# Patient Record
Sex: Female | Born: 1970
Health system: Southern US, Community
[De-identification: ages and names within clinical notes are randomized; demographics above are authoritative.]

## PROBLEM LIST (undated history)

## (undated) DIAGNOSIS — I10 Essential (primary) hypertension: Secondary | ICD-10-CM

## (undated) DIAGNOSIS — L709 Acne, unspecified: Secondary | ICD-10-CM

## (undated) DIAGNOSIS — J302 Other seasonal allergic rhinitis: Secondary | ICD-10-CM

## (undated) DIAGNOSIS — M549 Dorsalgia, unspecified: Secondary | ICD-10-CM

---

## 1997-08-08 ENCOUNTER — Emergency Department (HOSPITAL_COMMUNITY): Admission: EM | Admit: 1997-08-08 | Discharge: 1997-08-08 | Payer: Self-pay | Admitting: Emergency Medicine

## 1997-08-11 ENCOUNTER — Emergency Department (HOSPITAL_COMMUNITY): Admission: EM | Admit: 1997-08-11 | Discharge: 1997-08-11 | Payer: Self-pay | Admitting: Emergency Medicine

## 2000-05-22 ENCOUNTER — Emergency Department (HOSPITAL_COMMUNITY): Admission: EM | Admit: 2000-05-22 | Discharge: 2000-05-22 | Payer: Self-pay | Admitting: Emergency Medicine

## 2000-05-31 ENCOUNTER — Emergency Department (HOSPITAL_COMMUNITY): Admission: EM | Admit: 2000-05-31 | Discharge: 2000-05-31 | Payer: Self-pay | Admitting: Emergency Medicine

## 2001-05-23 ENCOUNTER — Encounter: Admission: RE | Admit: 2001-05-23 | Discharge: 2001-05-23 | Payer: Self-pay | Admitting: Psychiatry

## 2001-05-26 ENCOUNTER — Encounter: Admission: RE | Admit: 2001-05-26 | Discharge: 2001-05-26 | Payer: Self-pay | Admitting: *Deleted

## 2001-06-06 ENCOUNTER — Encounter: Admission: RE | Admit: 2001-06-06 | Discharge: 2001-06-06 | Payer: Self-pay | Admitting: *Deleted

## 2002-04-09 ENCOUNTER — Emergency Department (HOSPITAL_COMMUNITY): Admission: EM | Admit: 2002-04-09 | Discharge: 2002-04-09 | Payer: Self-pay

## 2002-11-05 ENCOUNTER — Emergency Department (HOSPITAL_COMMUNITY): Admission: EM | Admit: 2002-11-05 | Discharge: 2002-11-05 | Payer: Self-pay

## 2004-11-16 ENCOUNTER — Ambulatory Visit: Payer: Self-pay | Admitting: Internal Medicine

## 2004-12-07 ENCOUNTER — Ambulatory Visit: Payer: Self-pay | Admitting: Family Medicine

## 2005-02-03 ENCOUNTER — Ambulatory Visit: Payer: Self-pay | Admitting: Family Medicine

## 2005-02-17 ENCOUNTER — Encounter (INDEPENDENT_AMBULATORY_CARE_PROVIDER_SITE_OTHER): Payer: Self-pay | Admitting: *Deleted

## 2005-02-17 ENCOUNTER — Ambulatory Visit: Payer: Self-pay | Admitting: Family Medicine

## 2006-09-09 ENCOUNTER — Emergency Department (HOSPITAL_COMMUNITY): Admission: EM | Admit: 2006-09-09 | Discharge: 2006-09-09 | Payer: Self-pay | Admitting: Emergency Medicine

## 2006-09-13 DIAGNOSIS — J309 Allergic rhinitis, unspecified: Secondary | ICD-10-CM | POA: Insufficient documentation

## 2011-11-28 ENCOUNTER — Encounter (HOSPITAL_COMMUNITY): Payer: Self-pay | Admitting: Adult Health

## 2011-11-28 ENCOUNTER — Emergency Department (HOSPITAL_COMMUNITY)
Admission: EM | Admit: 2011-11-28 | Discharge: 2011-11-28 | Disposition: A | Discharge status: Home / Self Care | Payer: Managed Care, Other (non HMO) | Attending: Emergency Medicine | Admitting: Emergency Medicine

## 2011-11-28 DIAGNOSIS — IMO0002 Reserved for concepts with insufficient information to code with codable children: Secondary | ICD-10-CM

## 2011-11-28 DIAGNOSIS — W268XXA Contact with other sharp object(s), not elsewhere classified, initial encounter: Secondary | ICD-10-CM | POA: Insufficient documentation

## 2011-11-28 DIAGNOSIS — M549 Dorsalgia, unspecified: Secondary | ICD-10-CM | POA: Insufficient documentation

## 2011-11-28 DIAGNOSIS — I10 Essential (primary) hypertension: Secondary | ICD-10-CM | POA: Insufficient documentation

## 2011-11-28 DIAGNOSIS — Y929 Unspecified place or not applicable: Secondary | ICD-10-CM | POA: Insufficient documentation

## 2011-11-28 DIAGNOSIS — S61209A Unspecified open wound of unspecified finger without damage to nail, initial encounter: Secondary | ICD-10-CM | POA: Insufficient documentation

## 2011-11-28 DIAGNOSIS — Y9389 Activity, other specified: Secondary | ICD-10-CM | POA: Insufficient documentation

## 2011-11-28 HISTORY — DX: Dorsalgia, unspecified: M54.9

## 2011-11-28 HISTORY — DX: Essential (primary) hypertension: I10

## 2011-11-28 HISTORY — DX: Other seasonal allergic rhinitis: J30.2

## 2011-11-28 HISTORY — DX: Acne, unspecified: L70.9

## 2011-11-28 MED ORDER — TETANUS-DIPHTHERIA TOXOIDS TD 5-2 LFU IM INJ
0.5000 mL | INJECTION | Freq: Once | INTRAMUSCULAR | Status: AC
  Administered 2011-11-28: 0.5 mL via INTRAMUSCULAR
  Filled 2011-11-28: qty 0.5

## 2011-11-28 NOTE — ED Notes (Signed)
Pt states understanding of discharge instructions 

## 2011-11-28 NOTE — ED Provider Notes (Signed)
History     CSN: [id number]  Arrival date & time 11/28/11  0104   First MD Initiated Contact with Patient 11/28/11 0155      Chief Complaint  Patient presents with  . Laceration    (Consider location/radiation/quality/duration/timing/severity/associated sxs/prior treatment) HPI Comments: Patient was washing dishes with a piece of steel while when she sustained a superficial laceration to her right palm approximately 1 cm long.  No active bleeding.  Her last tetanus status is unknown  Patient is a 41 y.o. female presenting with skin laceration. The history is provided by the patient.  Laceration  The incident occurred 1 to 2 hours ago. The laceration is 1 cm in size. The laceration mechanism is unknown.The pain is at a severity of 1/10. The patient is experiencing no pain. Her tetanus status is out of date.    Past Medical History  Diagnosis Date  . Hypertension   . Back pain   . Acne   . Seasonal allergies     History reviewed. No pertinent past surgical history.  History reviewed. No pertinent family history.  History  Substance Use Topics  . Smoking status: Never Smoker   . Smokeless tobacco: Not on file  . Alcohol Use: No    OB History    Grav Para Term Preterm Abortions TAB SAB Ect Mult Living                  Review of Systems  Constitutional: Negative for activity change.  Musculoskeletal: Negative for joint swelling.  Skin: Positive for wound.  Neurological: Negative for numbness.    Allergies  Review of patient's allergies indicates no known allergies.  Home Medications   Current Outpatient Rx  Name  Route  Sig  Dispense  Refill  . DICLOFENAC SODIUM 75 MG PO TBEC   Oral   Take 75 mg by mouth 2 (two) times daily as needed. For pain         . DOXYCYCLINE HYCLATE 100 MG PO TABS   Oral   Take 100 mg by mouth 2 (two) times daily.         Marland Kitchen HALOPERIDOL 2 MG PO TABS   Oral   Take 2 mg by mouth at bedtime.         Marland Kitchen  LISINOPRIL-HYDROCHLOROTHIAZIDE 20-25 MG PO TABS   Oral   Take 1 tablet by mouth daily.           BP 156/83  Pulse 88  Temp 97.4 F (36.3 C) (Oral)  Resp 16  SpO2 100%  Physical Exam  Constitutional: She is oriented to person, place, and time. She appears well-developed and well-nourished.  HENT:  Head: Normocephalic.  Eyes: Pupils are equal, round, and reactive to light.  Neck: Normal range of motion.  Cardiovascular: Normal rate.   Pulmonary/Chest: Effort normal.  Musculoskeletal: Normal range of motion. She exhibits no edema and no tenderness.  Neurological: She is alert and oriented to person, place, and time.  Skin: Skin is warm.       Superficial lacerations to the callus of the right thumb of the, the, DIP joint on the palmar surface    ED Course  Procedures (including critical care time)  Labs Reviewed - No data to display No results found.   1. Laceration       MDM   Superficial laceration to the palmar surface of the right thumb.  Will closed with Dermabond as it doesn't involve the joint and update her  tetanus        Arman Filter, NP 11/28/11 [phone]

## 2011-11-28 NOTE — ED Notes (Signed)
Cut right hand thumb with a steel wool pad while washing dishes, 0.5 inch cut, bleeding controlled, CMS intact

## 2011-11-28 NOTE — ED Provider Notes (Signed)
Medical screening examination/treatment/procedure(s) were performed by non-physician practitioner and as supervising physician I was immediately available for consultation/collaboration.  Sunnie Nielsen, MD 11/28/11 [phone]

## 2012-01-17 ENCOUNTER — Ambulatory Visit: Payer: Self-pay | Admitting: Family Medicine

## 2012-01-17 VITALS — BP 131/83 | HR 93 | Temp 97.5°F | Resp 16 | Ht 61.5 in | Wt 122.6 lb

## 2012-01-17 DIAGNOSIS — N912 Amenorrhea, unspecified: Secondary | ICD-10-CM

## 2012-01-17 DIAGNOSIS — Z3009 Encounter for other general counseling and advice on contraception: Secondary | ICD-10-CM

## 2012-01-17 LAB — POCT URINE PREGNANCY: Preg Test, Ur: NEGATIVE

## 2012-01-17 NOTE — Patient Instructions (Addendum)
NO CONDOMS, NO SEX!  Advise going to health department sometime and getting tested for STD's (gonorrhea, chlamydia, HIV, syphillis).         Contraception Choices Birth control (contraception) can stop pregnancy from happening. Different types of birth control work in different ways. Some can:  Make the mucus in the cervix thick. This makes it hard for sperm to get into the uterus.  Thin the lining of the uterus. This makes it hard for an egg to attach to the wall of the uterus.  Stop the ovaries from releasing an egg.  Block the sperm from reaching the egg. Certain types of surgery can stop pregnancy from happening. For women, the sugery closes the fallopian tubes (tubal ligation). For men, the surgery stops sperm from releasing during sex (vasectomy). HORMONAL BIRTH CONTROL Hormonal birth control stops pregnancy by putting hormones into your body. Types of birth control include:  A small tube put under the skin of the upper arm (implant). The tube can stay in place for 3 years.  Shots given every 3 months.  Pills taken every day or once after sex (intercourse).  Patches that are changed once a week.  A ring put into the vagina (vaginal ring). The ring is left in place for 3 weeks and removed for 1 week. Then, a new ring is put in the vagina. BARRIER BIRTH CONTROL  Barrier birth control blocks sperm from reaching the egg. Types of birth control include:   A thin covering worn on the penis (female condom) during sex.  A soft, loose covering put into the vagina (female condom) before sex.  A rubber bowl that sits over the cervix (diaphragm). The bowl must be made for you. The bowl is put into the vagina before sex. The bowl is left in place for 6 to 8 hours after sex.  A small, soft cup that fits over the cervix (cervical cap). The cup must be made for you. The cup can be left in place for 48 hours after sex.  A sponge that is put into the vagina before sex.  A chemical that  kills or blocks sperm from getting into the cervix and uterus (spermicide). The chemical may be a cream, jelly, foam, or pill. INTRAUTERINE (IUD) BIRTH CONTROL  IUD birth control is a small, T-shaped piece of plastic. The plastic is put inside the uterus. There are 2 types of IUD:  Copper IUD. The IUD is covered in copper wire. The copper makes a fluid that kills sperm. It can stay in place for 10 years.  Hormone IUD. The hormone stops pregnancy from happening. It can stay in place for 5 years. NATURAL FAMILY PLANNING BIRTH CONTROL  Natural family planning means not having sex or using barrier birth control when the woman is fertile. A woman can:  Use a calendar to keep track of when she is fertile.  Use a thermometer to measure her body temperature. Protect yourself against sexual diseases no matter what type of birth control you use. Talk to your doctor about which type of birth control is best for you. Document Released: 11/01/2008 Document Revised: 03/29/2011 Document Reviewed: 05/13/2010 [facility] Patient Information 2013 [place], [place].

## 2012-01-17 NOTE — Progress Notes (Signed)
Subjective: Patient is here for amenorrhea. Her boyfriend who is from [place] is here with her. They do not live together. They do have sex, and he refuses to use a condom apparently. Initially she reported that her last period was in November. However, after he exited the room she told me that her last period was December 23. She is from [place] and a little difficult to understand. A conversation was a little confusing. He apparently insists on not using condoms but does not want her to get pregnant will be pregnant. She does not want to use anything else at this time. She is going to get him to use condoms.  Objective: Abdomen soft Urine hCG is negative  Assessment: Normal menstruation/false history of amenorrhea  Plan: See instructions. I spoke to him about the mandatory nature of him using condoms.

## 2013-02-23 ENCOUNTER — Encounter (HOSPITAL_COMMUNITY): Payer: Self-pay | Admitting: Emergency Medicine

## 2013-02-23 ENCOUNTER — Emergency Department (INDEPENDENT_AMBULATORY_CARE_PROVIDER_SITE_OTHER)
Admission: EM | Admit: 2013-02-23 | Discharge: 2013-02-23 | Disposition: A | Discharge status: Home / Self Care | Payer: BC Managed Care – PPO | Source: Home / Self Care | Attending: Family Medicine | Admitting: Family Medicine

## 2013-02-23 DIAGNOSIS — L84 Corns and callosities: Secondary | ICD-10-CM

## 2013-02-23 NOTE — Discharge Instructions (Signed)
Please read instructions below. You can get over the counter pads (Dr Felicie [employer]) to use to dissolve the corns on your toes. Soaking your feet in warm water or showering before you apply the pads may help. If they do not improve you can arrange follow up at the [place] for further evaluation.   [place] version of above instructions:   [employer]. Vous pouvez [employer] (Dr [employer] marques)  utiliser pour [employer] vos orteils. Se tremper les pieds dans l'eau chaude ou une douche avant que vous appliquez les lectrodes peuvent vous aider. S'ils ne s'amliorent pas vous pouvez organiser le suivi jusqu' l'intrieur de la triade pied centre pour [employer] plus approfondie.  Corns and Calluses Corns are small areas of thickened skin that usually occur on the top, sides, or tip of a toe. They contain a cone-shaped core with a point that can press on a nerve below. This causes pain. Calluses are areas of thickened skin that usually develop on hands, fingers, palms, soles of the feet, and heels. These are areas that experience frequent friction or pressure. CAUSES  Corns are usually the result of rubbing (friction) or pressure from shoes that are too tight or do not fit properly. Calluses are caused by repeated friction and pressure on the affected areas. SYMPTOMS  A hard growth on the skin.  Pain or tenderness under the skin.  Sometimes, redness and swelling.  Increased discomfort while wearing tight-fitting shoes. DIAGNOSIS  Your caregiver can usually tell what the problem is by doing a physical exam. TREATMENT  Removing the cause of the friction or pressure is usually the only treatment needed. However, sometimes medicines can be used to help soften the hardened, thickened areas. These medicines include salicylic acid plasters and 12% ammonium lactate lotion. These medicines should only be used under the direction  of your caregiver. HOME CARE INSTRUCTIONS   Try to remove pressure from the affected area.  You may wear donut-shaped corn pads to protect your skin.  You may use a pumice stone or nonmetallic nail file to gently reduce the thickness of a corn.  Wear properly fitted footwear.  If you have calluses on the hands, wear gloves during activities that cause friction.  If you have diabetes, you should regularly examine your feet. Tell your caregiver if you notice any problems with your feet. SEEK IMMEDIATE MEDICAL CARE IF:   You have increased pain, swelling, redness, or warmth in the affected area.  Your corn or callus starts to drain fluid or bleeds.  You are not getting better, even with treatment. Document Released: 10/11/2003 Document Revised: 03/29/2011 Document Reviewed: 09/01/2010 [facility] Patient Information 2014 [place], [place].  [place] Version of above instructions:  Les cors et [employer] de petites zones de peau paissie qui se produisent gnralement sur le haut, les cts ou la pointe d'un orteil. Ils contiennent une forme de cne core sur un point [employer] un nerf ci-dessous. Ceci provoque la douleur. Callosits sont des zones de peau paissie que dveloppent habituellement sur les mains, les doigts, les paumes et la plante des pieds, et talons. Ces domaines sont des [employer] frquemment de friction ou de pression. CAUSES Corns sont habituellement le rsultat de frottement (frottement) ou la pression de chaussures qui sont trop serrs ou ne pas Dietitian. Callosits sont causes par friction rptes et de [employer] zones touches. SYMPTMES une croissance dure  sur la peau. La douleur ou la tendresse sous la peau. Parfois, rougeur et oedme. Une gne accrue tout en portant tanche chaussures. DIAGNOSTIC Votre soignant peut gnralement dire quel est le problme en faisant un examen physique. TRAITEMENT supprimant la  cause de la friction ou la pression est gnralement le seul traitement ncessaires. Parfois, cependant, mdicaments peuvent tre utiliss pour [employer] endurcis, paissie domaines. Ces mdicaments comprennent acide salicylique sparadraps et 12% lactate d'ammonium lotion. Ces mdicaments ne devraient tre utiliss que sous la direction de votre soignant. SOINS  DOMICILE LES INSTRUCTIONS essayer d'enlever la pression de la zone touche. Vous pouvez porter en forme de beigne mas pads pour protger votre peau. Vous pouvez utiliser une pierre ponce ou non mtalliques lime  ongles doucement rduire Market researcher mas. Usure correctement quip de chaussures. Si vous avez [employer], porter des gants pendant les activits que provoquer des frictions. Si vous avez le diabte, vous devriez Psychiatrist vos pieds. Avertissez votre mdecin si vous remarquez des [employer] vos pieds. Consultez immdiatement votre mdecin si : vous Product/process development scientist, l'enflure, la rougeur, ou la Hum[employer] Inc zone [place]. Votre cueilleur  mas ou callosit commence  vidanger le liquide ou saigne. Vous n'tes pas mieux, mme avec un traitement.

## 2013-02-23 NOTE — ED Notes (Signed)
C/o problem w left foot x > 1 month, getting worse, also has cold type symptoms x past couple of days; NAD

## 2013-02-24 NOTE — ED Provider Notes (Signed)
CSN: [id number]     Arrival date & time 02/23/13  1051 History   First MD Initiated Contact with Patient 02/23/13 1244     Chief Complaint  Patient presents with  . Foot Problem    Patient is a 43 y.o. female presenting with lower extremity pain. The history is provided by the patient.  Foot Pain This is a new problem. The current episode started more than 1 week ago. The problem occurs constantly. The problem has been gradually worsening. The symptoms are aggravated by walking. She has tried nothing for the symptoms.    Past Medical History  Diagnosis Date  . Hypertension   . Back pain   . Acne   . Seasonal allergies    History reviewed. No pertinent past surgical history. History reviewed. No pertinent family history. History  Substance Use Topics  . Smoking status: Never Smoker   . Smokeless tobacco: Not on file  . Alcohol Use: No   OB History   Grav Para Term Preterm Abortions TAB SAB Ect Mult Living                 Review of Systems  All other systems reviewed and are negative.    Allergies  Review of patient's allergies indicates no known allergies.  Home Medications   Current Outpatient Rx  Name  Route  Sig  Dispense  Refill  . diclofenac (VOLTAREN) 75 MG EC tablet   Oral   Take 75 mg by mouth 2 (two) times daily as needed. For pain         . doxycycline (VIBRA-TABS) 100 MG tablet   Oral   Take 100 mg by mouth 2 (two) times daily.         . haloperidol (HALDOL) 2 MG tablet   Oral   Take 2 mg by mouth at bedtime.         Marland Kitchen ibuprofen (ADVIL,MOTRIN) 100 MG tablet   Oral   Take 100 mg by mouth every 6 (six) hours as needed.         Marland Kitchen lisinopril-hydrochlorothiazide (PRINZIDE,ZESTORETIC) 20-25 MG per tablet   Oral   Take 1 tablet by mouth daily.         . Multiple Vitamin (MULTIVITAMIN) tablet   Oral   Take 1 tablet by mouth daily.          BP 160/92  Pulse 87  Temp(Src) 97.5 F (36.4 C) (Oral)  Resp 16  SpO2 100% Physical Exam    Vitals reviewed. Constitutional: She is oriented to person, place, and time. She appears well-developed and well-nourished.  Non-toxic appearance. She does not have a sickly appearance. She does not appear ill.  HENT:  Head: Normocephalic and atraumatic.  Eyes: Conjunctivae are normal.  Cardiovascular: Normal rate.   Pulmonary/Chest: Effort normal.  Abdominal: There is no guarding.  Musculoskeletal: Normal range of motion.       Left foot: She exhibits normal range of motion and no swelling.       Feet:  Hyperkeratotic lesions noted on lateral aspects of 3rd and 4th (L) toes. No swelling, erythema or other  s/s infectious process to surrounding tissues.   Neurological: She is alert and oriented to person, place, and time.  Skin: Skin is warm and dry.  Psychiatric: She has a normal mood and affect.    ED Course  Procedures (including critical care time) Labs Review Labs Reviewed - No data to display Imaging Review No results found.  MDM   1. Corn of toe (Hyperkeratosis of (L) 3rd and 4th toes)    1 month h/o worsening pain to (L) foot related to hyperkeratotic lesions between 3rd and 4th (L) toes. No s/s infectious process. Home care and OTC remedies discussed (and provided in writing). Dermatology and Podiatry referrals provided  If no improvement after these measures. Pt verbalizes understanding and is agreeable.Rhetta Mura Schorr, NP 02/24/13 1415

## 2013-02-28 NOTE — ED Provider Notes (Signed)
Medical screening examination/treatment/procedure(s) were performed by resident physician or non-physician practitioner and as supervising physician I was immediately available for consultation/collaboration.   Pauline Good MD.   Billy Fischer, MD 02/28/13 [phone]

## 2013-08-31 ENCOUNTER — Emergency Department (INDEPENDENT_AMBULATORY_CARE_PROVIDER_SITE_OTHER)
Admission: EM | Admit: 2013-08-31 | Discharge: 2013-08-31 | Disposition: A | Discharge status: Home / Self Care | Payer: BC Managed Care – PPO | Source: Home / Self Care | Attending: Emergency Medicine | Admitting: Emergency Medicine

## 2013-08-31 ENCOUNTER — Encounter (HOSPITAL_COMMUNITY): Payer: Self-pay | Admitting: Emergency Medicine

## 2013-08-31 DIAGNOSIS — N926 Irregular menstruation, unspecified: Secondary | ICD-10-CM

## 2013-08-31 DIAGNOSIS — N939 Abnormal uterine and vaginal bleeding, unspecified: Secondary | ICD-10-CM

## 2013-08-31 LAB — POCT URINALYSIS DIP (DEVICE)
Glucose, UA: 100 mg/dL — AB
Ketones, ur: 15 mg/dL — AB
NITRITE: NEGATIVE
PH: 5.5 (ref 5.0–8.0)
Protein, ur: >=300 mg/dL — AB
Specific Gravity, Urine: 1.01 (ref 1.005–1.030)
UROBILINOGEN UA: 1 mg/dL (ref 0.0–1.0)

## 2013-08-31 LAB — POCT I-STAT, CHEM 8
BUN: 13 mg/dL (ref 6–23)
Calcium, Ion: 1.18 mmol/L (ref 1.12–1.23)
Chloride: 99 mEq/L (ref 96–112)
Creatinine, Ser: 0.6 mg/dL (ref 0.50–1.10)
GLUCOSE: 77 mg/dL (ref 70–99)
HCT: 35 % — ABNORMAL LOW (ref 36.0–46.0)
HEMOGLOBIN: 11.9 g/dL — AB (ref 12.0–15.0)
POTASSIUM: 3.8 meq/L (ref 3.7–5.3)
Sodium: 136 mEq/L — ABNORMAL LOW (ref 137–147)
TCO2: 25 mmol/L (ref 0–100)

## 2013-08-31 LAB — POCT PREGNANCY, URINE: Preg Test, Ur: NEGATIVE

## 2013-08-31 MED ORDER — MEDROXYPROGESTERONE ACETATE 10 MG PO TABS: 10.0000 mg | ORAL_TABLET | Freq: Every day | ORAL | Status: DC

## 2013-08-31 NOTE — Discharge Instructions (Signed)
There are many things that can cause persistent bleeding. Please take the medicine Provera 1 pill daily until gone.  This should stop the bleeding.  Follow up with your primary care doctor in 2-4 weeks to discuss this bleeding.  If it stops and your cycles go back to normal, you do not necessarily need to see him. If you develop dizziness, palpitations, shortness of breath, or the bleeding is getting worse please go to the emergency room.

## 2013-08-31 NOTE — ED Notes (Signed)
C/o prolonged menstrual cycle States cycle started July 31st and has not stopped States she does not feel any hormone change

## 2013-08-31 NOTE — ED Provider Notes (Signed)
CSN: [id number]     Arrival date & time 08/31/13  0815 History   First MD Initiated Contact with Patient 08/31/13 [phone]     Chief Complaint  Patient presents with  . Menstrual Problem   (Consider location/radiation/quality/duration/timing/severity/associated sxs/prior Treatment) HPI 43 year old woman here for evaluation of prolonged menstrual bleeding. She states her period started 2 weeks ago, and has not stopped. She reports heavy bleeding with clots. She is going through 8-10 pads a day. She denies any abdominal pain or vaginal trauma. No vaginal discharge or vaginal itching. No sensitivity to heat or cold, no changes in skin or nail, no weight gain or weight loss. She states she had a similar episode about 10 years ago that eventually resolved on its own. She denies any dizziness or palpitations. She states her periods occur every 4-6 weeks.  Past Medical History  Diagnosis Date  . Hypertension   . Back pain   . Acne   . Seasonal allergies    History reviewed. No pertinent past surgical history. History reviewed. No pertinent family history. History  Substance Use Topics  . Smoking status: Never Smoker   . Smokeless tobacco: Not on file  . Alcohol Use: No   OB History   Grav Para Term Preterm Abortions TAB SAB Ect Mult Living                 Review of Systems  Constitutional: Negative.   Respiratory: Negative.   Cardiovascular: Negative.   Gastrointestinal: Negative.   Endocrine: Negative.   Genitourinary: Positive for vaginal bleeding and menstrual problem. Negative for dysuria, vaginal discharge and vaginal pain.  Neurological: Negative.     Allergies  Review of patient's allergies indicates no known allergies.  Home Medications   Prior to Admission medications   Medication Sig Start Date End Date Taking? Authorizing Provider  diclofenac (VOLTAREN) 75 MG EC tablet Take 75 mg by mouth 2 (two) times daily as needed. For pain    Historical Provider, MD  doxycycline  (VIBRA-TABS) 100 MG tablet Take 100 mg by mouth 2 (two) times daily.    Historical Provider, MD  haloperidol (HALDOL) 2 MG tablet Take 2 mg by mouth at bedtime.    Historical Provider, MD  ibuprofen (ADVIL,MOTRIN) 100 MG tablet Take 100 mg by mouth every 6 (six) hours as needed.    Historical Provider, MD  lisinopril-hydrochlorothiazide (PRINZIDE,ZESTORETIC) 20-25 MG per tablet Take 1 tablet by mouth daily.    Historical Provider, MD  medroxyPROGESTERone (PROVERA) 10 MG tablet Take 1 tablet (10 mg total) by mouth daily. 08/31/13   Melony Overly, MD  Multiple Vitamin (MULTIVITAMIN) tablet Take 1 tablet by mouth daily.    Historical Provider, MD   BP 153/98  Pulse 81  Temp(Src) 97.9 F (36.6 C) (Oral)  Resp 18  SpO2 98%  LMP 08/17/2013 Physical Exam  Constitutional: She is oriented to person, place, and time. She appears well-developed and well-nourished. No distress.  Eyes: Conjunctivae are normal.  Neck: Neck supple.  Cardiovascular: Normal rate, regular rhythm and normal heart sounds.  Exam reveals no gallop.   No murmur heard. Pulmonary/Chest: Effort normal and breath sounds normal. No respiratory distress. She has no wheezes. She has no rales.  Abdominal: Soft. Bowel sounds are normal. She exhibits no distension and no mass. There is no tenderness. There is no rebound and no guarding.  Neurological: She is alert and oriented to person, place, and time.  Skin: Skin is warm and dry. No rash noted.  No pallor appreciated    ED Course  Procedures (including critical care time) Labs Review Labs Reviewed  POCT URINALYSIS DIP (DEVICE) - Abnormal; Notable for the following:    Glucose, UA 100 (*)    Bilirubin Urine LARGE (*)    Ketones, ur 15 (*)    Hgb urine dipstick LARGE (*)    Protein, ur >=300 (*)    Leukocytes, UA LARGE (*)    All other components within normal limits  POCT I-STAT, CHEM 8 - Abnormal; Notable for the following:    Sodium 136 (*)    Hemoglobin 11.9 (*)    HCT  35.0 (*)    All other components within normal limits  POCT PREGNANCY, URINE    Imaging Review No results found.   MDM   1. Abnormal uterine bleeding    UA is difficult to interpret given heavy menstrual bleeding. Irregular bleeding could be due to perimenopause, thyroid, uterine abnormalities. No clear etiology based on history. Hemoglobin is okay at 11.9. Will treat with Provera 10 mg daily x10 days. Recommended followup with PCP if periods continue to be irregular. Reviewed reasons to go to the ER as in after visit summary.    Melony Overly, MD 08/31/13 1002

## 2013-12-07 ENCOUNTER — Ambulatory Visit (INDEPENDENT_AMBULATORY_CARE_PROVIDER_SITE_OTHER): Payer: BC Managed Care – PPO | Admitting: Physician Assistant

## 2013-12-07 VITALS — BP 120/82 | HR 88 | Temp 97.9°F | Resp 16 | Ht 62.0 in | Wt 138.8 lb

## 2013-12-07 DIAGNOSIS — N926 Irregular menstruation, unspecified: Secondary | ICD-10-CM

## 2013-12-07 DIAGNOSIS — Z3009 Encounter for other general counseling and advice on contraception: Secondary | ICD-10-CM

## 2013-12-07 MED ORDER — NORELGESTROMIN-ETH ESTRADIOL 150-35 MCG/24HR TD PTWK: 1.0000 | MEDICATED_PATCH | TRANSDERMAL | Status: DC

## 2013-12-07 NOTE — Progress Notes (Signed)
   Subjective:    Patient ID: Cindy Garrison, female    DOB: 04/05/1970, 43 y.o.   MRN: [id number]  HPI Patient presents with boyfriend of 5 years (2010) for contraception counseling. He also doubles as a Optometrist. She has tried pills in the past, but could not keep up with them. She was prescribed Depo 2 years ago, but never used as she was scared of getting shots and the side effects it could cause. She does not have children and has had abortion by pill a few years ago where she bled severely. Boyfriend is adamant that he does not want to use condoms, however, does not wish to have anymore children. He has 3 already.   Currently on menstrual cycle started 12/04/13. Cycles are regular, but heavy and last up to two weeks at times. Can saturate one pad in an hour on heaviest days. Unsure how many she uses in a day. Believes last pap smear or pelvic exam was 3 months ago and normal.  Is not a smoker. PMH positive for HTN. Denies SOB/CP or HA/dizziness.    Review of Systems  Respiratory: Negative for cough and shortness of breath.   Cardiovascular: Negative for chest pain, palpitations and leg swelling.  Genitourinary: Positive for vaginal bleeding and menstrual problem (heavy). Negative for vaginal discharge, vaginal pain, pelvic pain and dyspareunia.  Neurological: Negative for dizziness, light-headedness and headaches.       Objective:   Physical Exam  Constitutional: She is oriented to person, place, and time. She appears well-developed and well-nourished. No distress.  Blood pressure 120/82, pulse 88, temperature 97.9 F (36.6 C), temperature source Oral, resp. rate 16, height 5\' 2"  (1.575 m), weight 138 lb 12.8 oz (62.959 kg), last menstrual period 12/04/2013, SpO2 100 %.   HENT:  Head: Normocephalic and atraumatic.  Right Ear: External ear normal.  Left Ear: External ear normal.  Eyes: Right eye exhibits no discharge. Left eye exhibits no discharge. No scleral icterus.  Neck:  Neck supple.  Pulmonary/Chest: Breath sounds normal.  Neurological: She is alert and oriented to person, place, and time.  Skin: She is not diaphoretic.  Psychiatric: She has a normal mood and affect. Her behavior is normal. Judgment and thought content normal.       Assessment & Plan:  1. Irregular menses 2. Encounter for other general counseling or advice on contraception - norelgestromin-ethinyl estradiol (ORTHO EVRA) 150-35 MCG/24HR transdermal patch; Place 1 patch onto the skin once a week.  Dispense: 3 patch; Refill: 12 - RTC in 3 months for pap smear.   Alveta Heimlich PA-C  [facility] 12/07/2013 11:09 AM

## 2013-12-07 NOTE — Progress Notes (Signed)
I was directly involved with the patient's care and agree with the diagnosis and treatment plan.

## 2013-12-07 NOTE — Patient Instructions (Signed)

## 2014-05-28 ENCOUNTER — Other Ambulatory Visit: Payer: Self-pay

## 2014-05-28 DIAGNOSIS — Z3009 Encounter for other general counseling and advice on contraception: Secondary | ICD-10-CM

## 2014-05-28 MED ORDER — NORELGESTROMIN-ETH ESTRADIOL 150-35 MCG/24HR TD PTWK: 1.0000 | MEDICATED_PATCH | TRANSDERMAL | Status: DC

## 2014-09-16 ENCOUNTER — Observation Stay (HOSPITAL_COMMUNITY)
Admission: EM | Admit: 2014-09-16 | Discharge: 2014-09-18 | Disposition: A | Discharge status: Home / Self Care | Payer: Self-pay | Attending: Internal Medicine | Admitting: Internal Medicine

## 2014-09-16 ENCOUNTER — Encounter (HOSPITAL_COMMUNITY): Payer: Self-pay | Admitting: Internal Medicine

## 2014-09-16 ENCOUNTER — Observation Stay (HOSPITAL_COMMUNITY): Payer: Self-pay

## 2014-09-16 ENCOUNTER — Emergency Department (HOSPITAL_COMMUNITY): Payer: Self-pay

## 2014-09-16 DIAGNOSIS — M549 Dorsalgia, unspecified: Secondary | ICD-10-CM | POA: Insufficient documentation

## 2014-09-16 DIAGNOSIS — R195 Other fecal abnormalities: Secondary | ICD-10-CM

## 2014-09-16 DIAGNOSIS — W01198A Fall on same level from slipping, tripping and stumbling with subsequent striking against other object, initial encounter: Secondary | ICD-10-CM | POA: Insufficient documentation

## 2014-09-16 DIAGNOSIS — Z872 Personal history of diseases of the skin and subcutaneous tissue: Secondary | ICD-10-CM | POA: Insufficient documentation

## 2014-09-16 DIAGNOSIS — Y9289 Other specified places as the place of occurrence of the external cause: Secondary | ICD-10-CM | POA: Insufficient documentation

## 2014-09-16 DIAGNOSIS — Y998 Other external cause status: Secondary | ICD-10-CM | POA: Insufficient documentation

## 2014-09-16 DIAGNOSIS — I10 Essential (primary) hypertension: Secondary | ICD-10-CM | POA: Insufficient documentation

## 2014-09-16 DIAGNOSIS — Y9389 Activity, other specified: Secondary | ICD-10-CM | POA: Insufficient documentation

## 2014-09-16 DIAGNOSIS — Z79899 Other long term (current) drug therapy: Secondary | ICD-10-CM | POA: Insufficient documentation

## 2014-09-16 DIAGNOSIS — N920 Excessive and frequent menstruation with regular cycle: Secondary | ICD-10-CM | POA: Diagnosis present

## 2014-09-16 DIAGNOSIS — D649 Anemia, unspecified: Secondary | ICD-10-CM | POA: Insufficient documentation

## 2014-09-16 DIAGNOSIS — W19XXXA Unspecified fall, initial encounter: Secondary | ICD-10-CM

## 2014-09-16 DIAGNOSIS — M5441 Lumbago with sciatica, right side: Secondary | ICD-10-CM

## 2014-09-16 DIAGNOSIS — E876 Hypokalemia: Principal | ICD-10-CM | POA: Insufficient documentation

## 2014-09-16 DIAGNOSIS — R55 Syncope and collapse: Secondary | ICD-10-CM

## 2014-09-16 DIAGNOSIS — F259 Schizoaffective disorder, unspecified: Secondary | ICD-10-CM | POA: Diagnosis present

## 2014-09-16 DIAGNOSIS — F258 Other schizoaffective disorders: Secondary | ICD-10-CM

## 2014-09-16 DIAGNOSIS — D508 Other iron deficiency anemias: Secondary | ICD-10-CM

## 2014-09-16 DIAGNOSIS — S0990XA Unspecified injury of head, initial encounter: Secondary | ICD-10-CM | POA: Insufficient documentation

## 2014-09-16 HISTORY — DX: Essential (primary) hypertension: I10

## 2014-09-16 HISTORY — DX: Syncope and collapse: R55

## 2014-09-16 LAB — CBC WITH DIFFERENTIAL/PLATELET
Basophils Absolute: 0 10*3/uL (ref 0.0–0.1)
Basophils Relative: 0 % (ref 0–1)
EOS ABS: 0 10*3/uL (ref 0.0–0.7)
Eosinophils Relative: 0 % (ref 0–5)
HCT: 20.1 % — ABNORMAL LOW (ref 36.0–46.0)
Hemoglobin: 6.1 g/dL — CL (ref 12.0–15.0)
LYMPHS ABS: 2.1 10*3/uL (ref 0.7–4.0)
Lymphocytes Relative: 27 % (ref 12–46)
MCH: 15.4 pg — ABNORMAL LOW (ref 26.0–34.0)
MCHC: 30.3 g/dL (ref 30.0–36.0)
MCV: 50.8 fL — AB (ref 78.0–100.0)
Monocytes Absolute: 0.8 10*3/uL (ref 0.1–1.0)
Monocytes Relative: 10 % (ref 3–12)
Neutro Abs: 4.7 10*3/uL (ref 1.7–7.7)
Neutrophils Relative %: 63 % (ref 43–77)
PLATELETS: 458 10*3/uL — AB (ref 150–400)
RBC: 3.96 MIL/uL (ref 3.87–5.11)
RDW: 20.1 % — AB (ref 11.5–15.5)
WBC: 7.6 10*3/uL (ref 4.0–10.5)

## 2014-09-16 LAB — URINALYSIS, ROUTINE W REFLEX MICROSCOPIC
BILIRUBIN URINE: NEGATIVE
Glucose, UA: NEGATIVE mg/dL
Hgb urine dipstick: NEGATIVE
KETONES UR: NEGATIVE mg/dL
Leukocytes, UA: NEGATIVE
Nitrite: NEGATIVE
Protein, ur: NEGATIVE mg/dL
Specific Gravity, Urine: 1.007 (ref 1.005–1.030)
Urobilinogen, UA: 0.2 mg/dL (ref 0.0–1.0)
pH: 7 (ref 5.0–8.0)

## 2014-09-16 LAB — BASIC METABOLIC PANEL
Anion gap: 9 (ref 5–15)
BUN: 8 mg/dL (ref 6–20)
CALCIUM: 9.2 mg/dL (ref 8.9–10.3)
CO2: 27 mmol/L (ref 22–32)
Chloride: 97 mmol/L — ABNORMAL LOW (ref 101–111)
Creatinine, Ser: 0.71 mg/dL (ref 0.44–1.00)
GLUCOSE: 97 mg/dL (ref 65–99)
Potassium: 3.3 mmol/L — ABNORMAL LOW (ref 3.5–5.1)
Sodium: 133 mmol/L — ABNORMAL LOW (ref 135–145)

## 2014-09-16 LAB — TSH: TSH: 1.057 u[IU]/mL (ref 0.350–4.500)

## 2014-09-16 LAB — PREPARE RBC (CROSSMATCH)

## 2014-09-16 LAB — CBG MONITORING, ED: Glucose-Capillary: 73 mg/dL (ref 65–99)

## 2014-09-16 LAB — RETICULOCYTES
RBC.: 4.28 MIL/uL (ref 3.87–5.11)
RETIC COUNT ABSOLUTE: 55.6 10*3/uL (ref 19.0–186.0)
Retic Ct Pct: 1.3 % (ref 0.4–3.1)

## 2014-09-16 LAB — IRON AND TIBC
IRON: 9 ug/dL — AB (ref 28–170)
Saturation Ratios: 2 % — ABNORMAL LOW (ref 10.4–31.8)
TIBC: 536 ug/dL — ABNORMAL HIGH (ref 250–450)
UIBC: 527 ug/dL

## 2014-09-16 LAB — I-STAT BETA HCG BLOOD, ED (MC, WL, AP ONLY): I-stat hCG, quantitative: <5 m[IU]/mL (ref <5)

## 2014-09-16 LAB — WET PREP, GENITAL
Trich, Wet Prep: NONE SEEN
YEAST WET PREP: NONE SEEN

## 2014-09-16 LAB — FERRITIN: Ferritin: 2 ng/mL — ABNORMAL LOW (ref 11–307)

## 2014-09-16 LAB — VITAMIN B12: Vitamin B-12: 646 pg/mL (ref 180–914)

## 2014-09-16 LAB — PROTIME-INR
INR: 1.08 (ref 0.00–1.49)
Prothrombin Time: 14.2 seconds (ref 11.6–15.2)

## 2014-09-16 LAB — ABO/RH: ABO/RH(D): AB POS

## 2014-09-16 LAB — POC OCCULT BLOOD, ED: FECAL OCCULT BLD: POSITIVE — AB

## 2014-09-16 LAB — APTT: APTT: 27 s (ref 24–37)

## 2014-09-16 LAB — FOLATE: FOLATE: 7 ng/mL (ref >5.9)

## 2014-09-16 LAB — PREGNANCY, URINE: PREG TEST UR: NEGATIVE

## 2014-09-16 MED ORDER — HYDROCODONE-ACETAMINOPHEN 5-325 MG PO TABS: 1.0000 | ORAL_TABLET | ORAL | Status: DC | PRN

## 2014-09-16 MED ORDER — GUAIFENESIN-DM 100-10 MG/5ML PO SYRP: 5.0000 mL | ORAL_SOLUTION | ORAL | Status: DC | PRN

## 2014-09-16 MED ORDER — ACETAMINOPHEN 325 MG PO TABS
650.0000 mg | ORAL_TABLET | Freq: Four times a day (QID) | ORAL | Status: DC | PRN
  Administered 2014-09-16: 650 mg via ORAL
  Filled 2014-09-16: qty 2

## 2014-09-16 MED ORDER — SODIUM CHLORIDE 0.9 % IV SOLN
Freq: Once | INTRAVENOUS | Status: AC
  Administered 2014-09-16: 13:00:00 via INTRAVENOUS

## 2014-09-16 MED ORDER — SODIUM CHLORIDE 0.9 % IV SOLN
8.0000 mg/h | INTRAVENOUS | Status: DC
  Filled 2014-09-16 (×2): qty 80

## 2014-09-16 MED ORDER — SODIUM CHLORIDE 0.9 % IJ SOLN
3.0000 mL | Freq: Two times a day (BID) | INTRAMUSCULAR | Status: DC
Start: 2014-09-16 — End: 2014-09-18
  Administered 2014-09-18: 3 mL via INTRAVENOUS

## 2014-09-16 MED ORDER — DIPHENHYDRAMINE HCL 50 MG/ML IJ SOLN: 25.0000 mg | Freq: Four times a day (QID) | INTRAMUSCULAR | Status: DC | PRN

## 2014-09-16 MED ORDER — CARVEDILOL 3.125 MG PO TABS
3.1250 mg | ORAL_TABLET | Freq: Two times a day (BID) | ORAL | Status: DC
  Administered 2014-09-16 – 2014-09-18 (×4): 3.125 mg via ORAL
  Filled 2014-09-16 (×4): qty 1

## 2014-09-16 MED ORDER — POTASSIUM CHLORIDE CRYS ER 20 MEQ PO TBCR
40.0000 meq | EXTENDED_RELEASE_TABLET | ORAL | Status: AC
  Administered 2014-09-16 (×2): 40 meq via ORAL
  Filled 2014-09-16 (×2): qty 2

## 2014-09-16 MED ORDER — ONDANSETRON HCL 4 MG PO TABS: 4.0000 mg | ORAL_TABLET | Freq: Four times a day (QID) | ORAL | Status: DC | PRN

## 2014-09-16 MED ORDER — SODIUM CHLORIDE 0.9 % IV SOLN
80.0000 mg | Freq: Once | INTRAVENOUS | Status: AC
  Administered 2014-09-16: 80 mg via INTRAVENOUS
  Filled 2014-09-16: qty 80

## 2014-09-16 MED ORDER — SODIUM CHLORIDE 0.9 % IV SOLN
8.0000 mg/h | INTRAVENOUS | Status: DC
  Administered 2014-09-16 – 2014-09-17 (×2): 8 mg/h via INTRAVENOUS
  Filled 2014-09-16 (×4): qty 80

## 2014-09-16 MED ORDER — HALOPERIDOL 2 MG PO TABS
2.0000 mg | ORAL_TABLET | Freq: Every day | ORAL | Status: DC
  Administered 2014-09-17: 2 mg via ORAL
  Filled 2014-09-16 (×3): qty 1

## 2014-09-16 MED ORDER — ALUM & MAG HYDROXIDE-SIMETH 200-200-20 MG/5ML PO SUSP: 30.0000 mL | Freq: Four times a day (QID) | ORAL | Status: DC | PRN

## 2014-09-16 MED ORDER — ADULT MULTIVITAMIN W/MINERALS CH
1.0000 | ORAL_TABLET | Freq: Every day | ORAL | Status: DC
  Administered 2014-09-17 – 2014-09-18 (×2): 1 via ORAL
  Filled 2014-09-16 (×4): qty 1

## 2014-09-16 MED ORDER — SODIUM CHLORIDE 0.9 % IV SOLN
Freq: Once | INTRAVENOUS | Status: AC
  Administered 2014-09-16: 15:00:00 via INTRAVENOUS

## 2014-09-16 MED ORDER — ONDANSETRON HCL 4 MG/2ML IJ SOLN: 4.0000 mg | Freq: Four times a day (QID) | INTRAMUSCULAR | Status: DC | PRN

## 2014-09-16 NOTE — Progress Notes (Signed)
Pt states she hit the back of her head when she fell today prior to admission.  Pt states she has a headache, pt is neurologically intact.  Pt receiving first unit of PRBC with stable VS.  NP paged to notify, will continue to monitor pt closely.

## 2014-09-16 NOTE — ED Notes (Signed)
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 Spoke with cathy from agency.

## 2014-09-16 NOTE — ED Notes (Signed)
Patient states that she was in the bank and was feeling bad.  States that she fell down and struck the back of her head.

## 2014-09-16 NOTE — H&P (Addendum)
Patient Demographics:    Cindy Garrison, is a 44 y.o. female  MRN: [id number]   DOB - 19-Apr-1970  Admit Date - 09/16/2014  Outpatient Primary MD for the patient is Harvie Junior, MD   With History of -  Past Medical History  Diagnosis Date  . #[id number]   . Back pain   . Acne   . Seasonal allergies   . Essential hypertension       No past surgical history on file.  in for   Chief Complaint  Patient presents with  . Loss of Consciousness      HPI:    Cindy Garrison  is a 44 y.o. French-speaking [place] female, history of menorrhagia with heavy irregular menstrual bleeds was supposed to be on contraception pills but does not take, schizophrenia, essential hypertension, chronic back pain takes NSAIDs off and on, who has had generalized weakness for the last several weeks, today she went to the bank where there was a long line and she was standing for several minutes, she subsequently felt lightheaded and fell to the floor, she does not recall if she lost consciousness or not. There was no seizure-like activity. No tongue bite no incontinence of bowel or bladder.  She was brought to the ER where her workup showed severe anemia, weakly heme positive stool, patient currently asymptomatic except for generalized weakness and I was called to admit the patient. Patient denies any headache at this time, no chest pain or palpitations, denies any abdominal pain, no blood in stool no dark-colored stool, no focal weakness. Note patient is [place] speaking but can communicate if questions are asked in English in simple language and slowly.    Review of systems:    In addition to the HPI above,   No Fever-chills, No Headache, No changes with Vision or hearing, No problems swallowing food or Liquids, No Chest  pain, Cough or Shortness of Breath, No Abdominal pain, No Nausea or Vommitting, Bowel movements are regular, No Blood in stool or Urine, No dysuria, No new skin rashes or bruises, No new joints pains-aches,  No new weakness, tingling, numbness in any extremity, does have generalized weakness No recent weight gain or loss, No polyuria, polydypsia or polyphagia, No significant Mental Stressors.  A full 10 point Review of Systems was done, except as stated above, all other Review of Systems were negative.    Social History:     Social History  Substance Use Topics  . Smoking status: Never Smoker   . Smokeless tobacco: Never Used  . Alcohol Use: No    Lives - at home and is fairly active     Family History :   No history of CAD or sudden cardiac death   Home Medications:   Prior to Admission medications   Medication Sig Start Date End Date Taking? Authorizing Provider  diclofenac (VOLTAREN) 75 MG EC tablet Take 75 mg by mouth  2 (two) times daily as needed. For pain   Yes Historical Provider, MD  haloperidol (HALDOL) 2 MG tablet Take 2 mg by mouth at bedtime.   Yes Historical Provider, MD  lisinopril-hydrochlorothiazide (PRINZIDE,ZESTORETIC) 20-25 MG per tablet Take 1 tablet by mouth daily.   Yes Historical Provider, MD  loratadine (CLARITIN) 10 MG tablet Take 10 mg by mouth daily.   Yes Historical Provider, MD  Multiple Vitamin (MULTIVITAMIN) tablet Take 1 tablet by mouth daily.   Yes Historical Provider, MD  norelgestromin-ethinyl estradiol (ORTHO EVRA) 150-35 MCG/24HR transdermal patch Place 1 patch onto the skin once a week. 05/28/14  Yes Tishira R Brewington, PA-C  medroxyPROGESTERone (PROVERA) 10 MG tablet Take 1 tablet (10 mg total) by mouth daily. Patient not taking: Reported on 09/16/2014 08/31/13   Melony Overly, MD     Allergies:    No Known Allergies   Physical Exam:   Vitals  Blood pressure 145/74, pulse 90, temperature 97.9 F (36.6 C), temperature source  Oral, resp. rate 19, last menstrual period 09/16/2014, SpO2 100 %.   1. General moderately built African-American female lying in bed in NAD,     2. Normal affect and insight, Not Suicidal or Homicidal, Awake Alert, Oriented X 3.  3. No F.N deficits, ALL C.Nerves Intact, Strength 5/5 all 4 extremities, Sensation intact all 4 extremities, Plantars down going.  4. Ears and Eyes appear Normal, Conjunctivae clear, PERRLA. Moist Oral Mucosa.  5. Supple Neck, No JVD, No cervical lymphadenopathy appriciated, No Carotid Bruits.  6. Symmetrical Chest wall movement, Good air movement bilaterally, CTAB.  7. RRR, No Gallops, Rubs or Murmurs, No Parasternal Heave.  8. Positive Bowel Sounds, Abdomen Soft, No tenderness, No organomegaly appriciated,No rebound -guarding or rigidity.  9.  No Cyanosis, Normal Skin Turgor, No Skin Rash or Bruise.  10. Good muscle tone,  joints appear normal , no effusions, Normal ROM.  11. No Palpable Lymph Nodes in Neck or Axillae      Data Review:    CBC  Recent Labs Lab 09/16/14 1122  WBC 7.6  HGB 6.1*  HCT 20.1*  PLT 458*  MCV 50.8*  MCH 15.4*  MCHC 30.3  RDW 20.1*  LYMPHSABS 2.1  MONOABS 0.8  EOSABS 0.0  BASOSABS 0.0   ------------------------------------------------------------------------------------------------------------------  Chemistries   Recent Labs Lab 09/16/14 1122  NA 133*  K 3.3*  CL 97*  CO2 27  GLUCOSE 97  BUN 8  CREATININE 0.71  CALCIUM 9.2   ------------------------------------------------------------------------------------------------------------------ CrCl cannot be calculated (Unknown ideal weight.). ------------------------------------------------------------------------------------------------------------------ No results for input(s): TSH, T4TOTAL, T3FREE, THYROIDAB in the last 72 hours.  Invalid input(s): FREET3   Coagulation profile  Recent Labs Lab 09/16/14 1115  INR 1.08    ------------------------------------------------------------------------------------------------------------------- No results for input(s): DDIMER in the last 72 hours. -------------------------------------------------------------------------------------------------------------------  Cardiac Enzymes No results for input(s): CKMB, TROPONINI, MYOGLOBIN in the last 168 hours.  Invalid input(s): CK ------------------------------------------------------------------------------------------------------------------ Invalid input(s): POCBNP   ---------------------------------------------------------------------------------------------------------------  Urinalysis    Component Value Date/Time   COLORURINE YELLOW 09/16/2014 1351   APPEARANCEUR CLEAR 09/16/2014 1351   LABSPEC 1.007 09/16/2014 1351   PHURINE 7.0 09/16/2014 1351   GLUCOSEU NEGATIVE 09/16/2014 1351   HGBUR NEGATIVE 09/16/2014 1351   BILIRUBINUR NEGATIVE 09/16/2014 1351   KETONESUR NEGATIVE 09/16/2014 1351   PROTEINUR NEGATIVE 09/16/2014 1351   UROBILINOGEN 0.2 09/16/2014 1351   NITRITE NEGATIVE 09/16/2014 1351   LEUKOCYTESUR NEGATIVE 09/16/2014 1351    ----------------------------------------------------------------------------------------------------------------   Imaging Results:    Dg Chest  2 View  09/16/2014   CLINICAL DATA:  Syncope.  EXAM: CHEST  2 VIEW  COMPARISON:  None.  FINDINGS: The heart size and mediastinal contours are within normal limits. Both lungs are clear. The visualized skeletal structures are unremarkable.  IMPRESSION: No active cardiopulmonary disease.   Electronically Signed   By: Markus Daft M.D.   On: 09/16/2014 12:07    My personal review of EKG: Rhythm NSR, no Acute ST changes   Assessment & Plan:     1. Syncope and collapse due to severe anemia. Will be admitted for 23 observation, monitor telemetry to rule out dysrhythmia which is less likely, type screen and transfuse 2 units of  packed RBC, anemia panel and monitor. Check baseline HIV screen.   2. Anemia. Likely due to severe menorrhagia which is chronic, check anemia panel, she is weakly guaiac positive but I do not think she has any brisk bleed, more so her stools could have been positive due to ongoing menstrual periods. For now we'll continue IV PPI, clear liquid diet and monitor. Her BUN is normal which reduces the chances of active upper GI bleed. Should follow with her OB physician post discharge for menorrhagia. If stable outpatient GI follow-up as well.  Note ER physician is to do pelvic exam, she will let me know if there are any abnormal findings. Follow GC chlamydia and pregnancy tests. Although with ongoing menstrual periods likelihood of pregnancy is minimal to none.   3. Schizophrenia. Continue Haldol per home dose. Monitor QTC on telemetry.   4.Essential hypertension. We'll place on low-dose Coreg and monitor.     DVT Prophylaxis  SCDs    AM Labs Ordered, also please review Full Orders  Family Communication: Admission, patients condition and plan of care including tests being ordered have been discussed with the patient  who indicates understanding and agree with the plan and Code Status.  Code Status Full  Likely DC to  Home 1-2 days  Condition GUARDED     Time spent in minutes : 35    Bracen Schum K M.D on 09/16/2014 at 3:02 PM  Between 7am to 7pm - Pager - [phone]  After 7pm go to [email or web address] - password [facility]  [facility] - Office  [phone]

## 2014-09-16 NOTE — ED Provider Notes (Signed)
Medical screening examination/treatment/procedure(s) were conducted as a shared visit with non-physician practitioner(s) and myself.  I personally evaluated the patient during the encounter.  Patient with prolonged menstrual cycle, but LOC today. Exam with normal vitals, grossly normal neurologic exam (language barrier on my examination), pale conjunctiva. Labs with new significant anemia. Fecal occult positive but no gross blood. Vaginal bleeding is mild/moderate in nature, unsure of etiology of anemia at this time. Will institute transfusion for symptomatic anemia and admit to medicine.      EKG Interpretation   Date/Time:  Monday September 16 2014 11:21:24 EDT Ventricular Rate:  82 PR Interval:  143 QRS Duration: 86 QT Interval:  392 QTC Calculation: 458 R Axis:   27 Text Interpretation:  Sinus rhythm Low voltage, precordial leads RSR' in  V1 or V2, probably normal variant Baseline wander in lead(s) I III aVL  Confirmed by [facility] MD, Kwynn Schlotter [phone]) on 09/16/2014 11:24:23 AM        Merrily Pew, MD 09/17/14 [phone]

## 2014-09-16 NOTE — ED Notes (Signed)
Patient was standing in line at the bank and felt dizzy.  She sat down and when she stood up she passed out.  Bystanders state that her eyes rolled back in her head and she passed out. Patient has no memory of the event.

## 2014-09-16 NOTE — ED Provider Notes (Signed)
CSN: [id number]     Arrival date & time 09/16/14  1057 History   First MD Initiated Contact with Patient 09/16/14 1102     Chief Complaint  Patient presents with  . Loss of Consciousness     (Consider location/radiation/quality/duration/timing/severity/associated sxs/prior Treatment) The history is provided by the patient. The history is limited by a language barrier. A language interpreter was used.     Cindy Garrison Is a 44 year old female with past medical history of hypertension, back pain, acne and seasonal allergies who presents emergency Department with chief complaint of syncope and collapse. The patient is from [place] and her preferred language is [place]. Professional medical interpreter is used for interpretation. The patient states that she was at the bank today when she became very, very tired and lightheaded. She sat down but when she stood back up, she lost consciousness. This and collapsed to the floor. She states that this happened to her one time previously about 5 months ago at work. She denies any racing or skipping in her heart. She had prodromal symptoms of tunnel vision and change in hearing. The patient states that she normally has periods the last 4-5 days. However, this patient's recent. Has lasted 2 weeks and she is still having a bit of spotting. She states that during the heaviest times. She was changing 3-4 pads a day. She denies heavy clotting. The patient denies any melena or hematochezia. Her initial labs show a hemoglobin of 6.1 1. She denies any other blood loss. Her fecal occult stool test is positive. She has mild hypokalemia. The patient denies symptoms of orthostasis.  Past Medical History  Diagnosis Date  . Hypertension   . Back pain   . Acne   . Seasonal allergies    No past surgical history on file. No family history on file. Social History  Substance Use Topics  . Smoking status: Never Smoker   . Smokeless tobacco: Never Used  . Alcohol Use: No    OB History    No data available     Review of Systems  Ten systems reviewed and are negative for acute change, except as noted in the HPI.    Allergies  Review of patient's allergies indicates no known allergies.  Home Medications   Prior to Admission medications   Medication Sig Start Date End Date Taking? Authorizing Provider  diclofenac (VOLTAREN) 75 MG EC tablet Take 75 mg by mouth 2 (two) times daily as needed. For pain    Historical Provider, MD  haloperidol (HALDOL) 2 MG tablet Take 2 mg by mouth at bedtime.    Historical Provider, MD  lisinopril-hydrochlorothiazide (PRINZIDE,ZESTORETIC) 20-25 MG per tablet Take 1 tablet by mouth daily.    Historical Provider, MD  medroxyPROGESTERone (PROVERA) 10 MG tablet Take 1 tablet (10 mg total) by mouth daily. 08/31/13   Melony Overly, MD  Multiple Vitamin (MULTIVITAMIN) tablet Take 1 tablet by mouth daily.    Historical Provider, MD  norelgestromin-ethinyl estradiol (ORTHO EVRA) 150-35 MCG/24HR transdermal patch Place 1 patch onto the skin once a week. 05/28/14   Tishira R Brewington, PA-C   LMP 09/16/2014 Physical Exam  Constitutional: She is oriented to person, place, and time. She appears well-developed and well-nourished. No distress.  HENT:  Head: Normocephalic and atraumatic.  Eyes: Conjunctivae are normal. No scleral icterus.  Neck: Normal range of motion.  Cardiovascular: Normal rate, regular rhythm and normal heart sounds.  Exam reveals no gallop and no friction rub.   No murmur  heard. Pulmonary/Chest: Effort normal and breath sounds normal. No respiratory distress.  Abdominal: Soft. Bowel sounds are normal. She exhibits no distension and no mass. There is no tenderness. There is no guarding.  Genitourinary:  Digital Rectal Exam reveals sphincter with good tone. No external hemorrhoids. No masses or fissures. Stool color is brown with no frank melena or red blood.   Neurological: She is alert and oriented to person, place,  and time.  Skin: Skin is warm and dry. She is not diaphoretic.  Nursing note and vitals reviewed.   ED Course  Procedures (including critical care time) Labs Review Labs Reviewed  CBC WITH DIFFERENTIAL/PLATELET - Abnormal; Notable for the following:    Hemoglobin 6.1 (*)    HCT 20.1 (*)    MCV 50.8 (*)    MCH 15.4 (*)    RDW 20.1 (*)    Platelets 458 (*)    All other components within normal limits  BASIC METABOLIC PANEL - Abnormal; Notable for the following:    Sodium 133 (*)    Potassium 3.3 (*)    Chloride 97 (*)    All other components within normal limits  URINALYSIS, ROUTINE W REFLEX MICROSCOPIC (NOT AT [facility])  PROTIME-INR  APTT  OCCULT BLOOD X 1 CARD TO LAB, STOOL  POCT CBG (FASTING - GLUCOSE)-MANUAL ENTRY  I-STAT BETA HCG BLOOD, ED ([facility], [facility], [facility] ONLY)  TYPE AND SCREEN  ABO/RH    Imaging Review Dg Chest 2 View  09/16/2014   CLINICAL DATA:  Syncope.  EXAM: CHEST  2 VIEW  COMPARISON:  None.  FINDINGS: The heart size and mediastinal contours are within normal limits. Both lungs are clear. The visualized skeletal structures are unremarkable.  IMPRESSION: No active cardiopulmonary disease.   Electronically Signed   By: Markus Daft M.D.   On: 09/16/2014 12:07   I have personally reviewed and evaluated these images and lab results as part of my medical decision-making.   EKG Interpretation   Date/Time:  Monday September 16 2014 11:21:24 EDT Ventricular Rate:  82 PR Interval:  143 QRS Duration: 86 QT Interval:  392 QTC Calculation: 458 R Axis:   27 Text Interpretation:  Sinus rhythm Low voltage, precordial leads RSR' in  V1 or V2, probably normal variant Baseline wander in lead(s) I III aVL  Confirmed by [facility] MD, Corene Cornea [phone]) on 09/16/2014 11:24:23 AM       CRITICAL CARE Performed by: Margarita Mail   Total critical care time: 40  Critical care time was exclusive of separately billable procedures and treating other patients.  Critical care was necessary to  treat or prevent imminent or life-threatening deterioration.  Critical care was time spent personally by me on the following activities: development of treatment plan with patient and/or surrogate as well as nursing, discussions with consultants, evaluation of patient's response to treatment, examination of patient, obtaining history from patient or surrogate, ordering and performing treatments and interventions, ordering and review of laboratory studies, ordering and review of radiographic studies, pulse oximetry and re-evaluation of patient's condition.   MDM   Final diagnoses:  Anemia, unspecified anemia type  Positive occult stool blood test  Hypokalemia    12:56 PM  BP 145/74 mmHg  Pulse 90  Temp(Src) 97.9 F (36.6 C) (Oral)  Resp 19  SpO2 100%  LMP 09/16/2014 LMP 09/02/2014 Patient with hgb of 6.1  Last hgb 1 year ago was 11.9 She has a positive occult stool. I have placed orders for blood transfusion. The patient  states that she has no religious objections to blood transfusion and verbily consents to plan of transfusion.   Pelvic exam: normal external genitalia, vulva, vagina, cervix, uterus and adnexa. Slight amount of vaginal bleeding consistent with menses.  Patient here with syncope and anemia. She has positive occult stool. Patient will be admitted as a GI bleed with acute anemia. She appears stable for admission at this time  Margarita Mail, PA-C 09/17/14 [place], MD 09/19/14 1524

## 2014-09-17 ENCOUNTER — Encounter (HOSPITAL_COMMUNITY): Payer: Self-pay | Admitting: *Deleted

## 2014-09-17 DIAGNOSIS — N921 Excessive and frequent menstruation with irregular cycle: Secondary | ICD-10-CM

## 2014-09-17 LAB — CBC
HCT: 32.4 % — ABNORMAL LOW (ref 36.0–46.0)
HEMOGLOBIN: 10.5 g/dL — AB (ref 12.0–15.0)
MCH: 19.4 pg — ABNORMAL LOW (ref 26.0–34.0)
MCHC: 32.4 g/dL (ref 30.0–36.0)
MCV: 60 fL — ABNORMAL LOW (ref 78.0–100.0)
PLATELETS: ADEQUATE 10*3/uL (ref 150–400)
RBC: 5.4 MIL/uL — AB (ref 3.87–5.11)
RDW: 31.1 % — ABNORMAL HIGH (ref 11.5–15.5)
WBC: 6.3 10*3/uL (ref 4.0–10.5)

## 2014-09-17 LAB — OSMOLALITY, URINE: OSMOLALITY UR: 73 mosm/kg — AB (ref 390–1090)

## 2014-09-17 LAB — CBC WITH DIFFERENTIAL/PLATELET
BASOS PCT: 0 % (ref 0–1)
Basophils Absolute: 0 10*3/uL (ref 0.0–0.1)
EOS PCT: 1 % (ref 0–5)
Eosinophils Absolute: 0.1 10*3/uL (ref 0.0–0.7)
HEMATOCRIT: 33.6 % — AB (ref 36.0–46.0)
HEMOGLOBIN: 10.9 g/dL — AB (ref 12.0–15.0)
LYMPHS PCT: 9 % — AB (ref 12–46)
Lymphs Abs: 1 10*3/uL (ref 0.7–4.0)
MCH: 19.4 pg — AB (ref 26.0–34.0)
MCHC: 32.4 g/dL (ref 30.0–36.0)
MCV: 59.9 fL — AB (ref 78.0–100.0)
MONO ABS: 0.5 10*3/uL (ref 0.1–1.0)
MONOS PCT: 5 % (ref 3–12)
NEUTROS PCT: 85 % — AB (ref 43–77)
Neutro Abs: 9 10*3/uL — ABNORMAL HIGH (ref 1.7–7.7)
Platelets: 325 10*3/uL (ref 150–400)
RBC: 5.61 MIL/uL — ABNORMAL HIGH (ref 3.87–5.11)
RDW: 30.2 % — ABNORMAL HIGH (ref 11.5–15.5)
WBC: 10.6 10*3/uL — ABNORMAL HIGH (ref 4.0–10.5)

## 2014-09-17 LAB — GC/CHLAMYDIA PROBE AMP (~~LOC~~) NOT AT ARMC
Chlamydia: NEGATIVE
NEISSERIA GONORRHEA: NEGATIVE

## 2014-09-17 LAB — BASIC METABOLIC PANEL
ANION GAP: 8 (ref 5–15)
BUN: <5 mg/dL — ABNORMAL LOW (ref 6–20)
CALCIUM: 8.8 mg/dL — AB (ref 8.9–10.3)
CO2: 22 mmol/L (ref 22–32)
CREATININE: 0.68 mg/dL (ref 0.44–1.00)
Chloride: 99 mmol/L — ABNORMAL LOW (ref 101–111)
Glucose, Bld: 80 mg/dL (ref 65–99)
Potassium: 4.7 mmol/L (ref 3.5–5.1)
SODIUM: 129 mmol/L — AB (ref 135–145)

## 2014-09-17 LAB — HEPATIC FUNCTION PANEL
ALBUMIN: 3.6 g/dL (ref 3.5–5.0)
ALT: 24 U/L (ref 14–54)
AST: 27 U/L (ref 15–41)
Alkaline Phosphatase: 46 U/L (ref 38–126)
BILIRUBIN TOTAL: 1.2 mg/dL (ref 0.3–1.2)
Bilirubin, Direct: <0.1 mg/dL — ABNORMAL LOW (ref 0.1–0.5)
Total Protein: 6.8 g/dL (ref 6.5–8.1)

## 2014-09-17 LAB — OSMOLALITY: Osmolality: 280 mOsm/kg (ref 275–300)

## 2014-09-17 LAB — CORTISOL: Cortisol, Plasma: 4.5 ug/dL

## 2014-09-17 LAB — BRAIN NATRIURETIC PEPTIDE: B Natriuretic Peptide: 156.8 pg/mL — ABNORMAL HIGH (ref 0.0–100.0)

## 2014-09-17 LAB — SODIUM, URINE, RANDOM: SODIUM UR: 16 mmol/L

## 2014-09-17 LAB — HIV ANTIBODY (ROUTINE TESTING W REFLEX): HIV Screen 4th Generation wRfx: NONREACTIVE

## 2014-09-17 MED ORDER — ACETAMINOPHEN 325 MG PO TABS
650.0000 mg | ORAL_TABLET | ORAL | Status: DC | PRN
  Administered 2014-09-17 (×2): 650 mg via ORAL
  Filled 2014-09-17 (×2): qty 2

## 2014-09-17 MED ORDER — SODIUM CHLORIDE 0.9 % IV SOLN
510.0000 mg | Freq: Once | INTRAVENOUS | Status: AC
  Administered 2014-09-17: 510 mg via INTRAVENOUS
  Filled 2014-09-17: qty 17

## 2014-09-17 MED ORDER — COSYNTROPIN 0.25 MG IJ SOLR
0.2500 mg | Freq: Once | INTRAMUSCULAR | Status: AC
  Administered 2014-09-18: 0.25 mg via INTRAVENOUS
  Filled 2014-09-17: qty 0.25

## 2014-09-17 MED ORDER — FERROUS SULFATE 325 (65 FE) MG PO TABS
325.0000 mg | ORAL_TABLET | Freq: Three times a day (TID) | ORAL | Status: DC
  Administered 2014-09-18 (×2): 325 mg via ORAL
  Filled 2014-09-17 (×2): qty 1

## 2014-09-17 MED ORDER — SODIUM CHLORIDE 0.9 % IV SOLN
INTRAVENOUS | Status: DC
  Administered 2014-09-17 – 2014-09-18 (×3): via INTRAVENOUS

## 2014-09-17 MED ORDER — PANTOPRAZOLE SODIUM 40 MG PO TBEC
40.0000 mg | DELAYED_RELEASE_TABLET | Freq: Two times a day (BID) | ORAL | Status: DC
  Administered 2014-09-17 – 2014-09-18 (×2): 40 mg via ORAL
  Filled 2014-09-17 (×2): qty 1

## 2014-09-17 NOTE — Progress Notes (Signed)
RN report to hold Haldol this evening but no order, notified Baltazar Najjar NP, awaiting orders.

## 2014-09-17 NOTE — Progress Notes (Signed)
[facility] PROGRESS NOTE  Cindy Garrison KYH:[id number] DOB: 1970/08/06 DOA: 09/16/2014 PCP: Harvie Junior, MD  Brief Summary    Assessment/Plan  Syncope and collapse due to severe anemia.  -  Tele:  NSR -  S/p 2 units of packed RBC -  anemia panel c/w iron deficiency anemia -  HIV NR  Iron deficiency anemia probably due to heavy/irregular menses but also had a weakly positive stool guaiac.  She takes NSAIDs so she may have some mild gastritis.   -  Folate, B12, and TSH wnl -  tx 2 unit PRBC -  feraheme infusion  -  Start oral ferrous sulfate -  GI referral for weakly guaiac positive stool -  Agree with PPI  -  Advance diet   Metromenorrhagia -  HCG neg -  Encouraged her to talk again with her GYN regarding choosing a birth control option that suppresses her periods entirely  Few clue cells and WBC on her wet prep, but no foul odor, symptoms, or discharge to support diagnosis of BV -  Observation only and tx if she develops symptoms  Hyponatremia, unusual for an otherwise healthy young person to have such a degree of hyponatremia.  This may be due to her haldol.   -  TSH:  wnl -  Check LFTs, cortisol level -  BNP mildly elevated, check ECHO  -  Cortisol level 4.5, borderline low -  Check cosyntropin stim test in AM -  Urine sodium and osms are low suggesting possible reaction to rehydration or due to primary polydipsia -  Start IVF and repeat BMP in AM  Schizophrenia. Continue Haldol per home dose. Monitor QTC on telemetry.  Essential hypertension. We'll place on low-dose Coreg and monitor.   Diet:  Healthy heart Access:  PIV IVF:  yes Proph:  SCDs  Code Status: full Family Communication: patient alone Disposition Plan: pending sodium either stable or improved.  If stable, suspect this is likely chronic and safe for discharge.  If improved, may have been due to dehydration.  Further work up if trending down.      Consultants:  None  Procedures:  Tx 2 unit PRBC  Antibiotics:  none   HPI/Subjective:  States she is feeling better.  Denies lightheadedness, difficulty breathing, chest pain.      Objective: Filed Vitals:   09/17/14 1517 09/17/14 0545 09/17/14 0726 09/17/14 1125  BP: 149/67  152/79 142/75  Pulse:   79 91  Temp: 98.1 F (36.7 C) 98.3 F (36.8 C) 97.7 F (36.5 C) 97.9 F (36.6 C)  TempSrc: Oral Oral Oral Oral  Resp: 16 16 17 19   Height:      Weight:  57.153 kg (126 lb)    SpO2: 100% 100% 100% 100%    Intake/Output Summary (Last 24 hours) at 09/17/14 1713 Last data filed at 09/17/14 0803  Gross per 24 hour  Intake 1101.67 ml  Output   2575 ml  Net -1473.33 ml   Filed Weights   09/16/14 1700 09/17/14 0545  Weight: 56.836 kg (125 lb 4.8 oz) 57.153 kg (126 lb)   Body mass index is 25.44 kg/(m^2).  Exam:   General:  Adult female, No acute distress  HEENT:  NCAT, MMM  Cardiovascular:  RRR, 1/6 systolic murmur, 2+ pulses, warm extremities  Respiratory:  CTAB, no increased WOB  Abdomen:   NABS, soft, NT/ND  MSK:   Normal tone and bulk, no LEE  Neuro:  Grossly intact  Data Reviewed: Basic Metabolic  Panel:  Recent Labs Lab 09/16/14 1122 09/17/14 0538  NA 133* 129*  K 3.3* 4.7  CL 97* 99*  CO2 27 22  GLUCOSE 97 80  BUN 8 <5*  CREATININE 0.71 0.68  CALCIUM 9.2 8.8*   Liver Function Tests:  Recent Labs Lab 09/17/14 1036  AST 27  ALT 24  ALKPHOS 46  BILITOT 1.2  PROT 6.8  ALBUMIN 3.6   No results for input(s): LIPASE, AMYLASE in the last 168 hours. No results for input(s): AMMONIA in the last 168 hours. CBC:  Recent Labs Lab 09/16/14 1122 09/17/14 0538 09/17/14 1036  WBC 7.6 6.3 10.6*  NEUTROABS 4.7  --  9.0*  HGB 6.1* 10.5* 10.9*  HCT 20.1* 32.4* 33.6*  MCV 50.8* 60.0* 59.9*  PLT 458* PLATELET CLUMPS NOTED ON SMEAR, COUNT APPEARS ADEQUATE 325    Recent Results (from the past 240 hour(s))  Wet prep, genital      Status: Abnormal   Collection Time: 09/16/14  4:52 PM  Result Value Ref Range Status   Yeast Wet Prep HPF POC NONE SEEN NONE SEEN Final   Trich, Wet Prep NONE SEEN NONE SEEN Final   Clue Cells Wet Prep HPF POC FEW (A) NONE SEEN Final   WBC, Wet Prep HPF POC FEW (A) NONE SEEN Final     Studies: Dg Chest 2 View  09/16/2014   CLINICAL DATA:  Syncope.  EXAM: CHEST  2 VIEW  COMPARISON:  None.  FINDINGS: The heart size and mediastinal contours are within normal limits. Both lungs are clear. The visualized skeletal structures are unremarkable.  IMPRESSION: No active cardiopulmonary disease.   Electronically Signed   By: Markus Daft M.D.   On: 09/16/2014 12:07   Ct Head Wo Contrast  09/17/2014   CLINICAL DATA:  Status post fall at bank, hitting back of head. Headache. Initial encounter.  EXAM: CT HEAD WITHOUT CONTRAST  TECHNIQUE: Contiguous axial images were obtained from the base of the skull through the vertex without intravenous contrast.  COMPARISON:  None.  FINDINGS: There is no evidence of acute infarction, mass lesion, or intra- or extra-axial hemorrhage on CT.  The posterior fossa, including the cerebellum, brainstem and fourth ventricle, is within normal limits. The third and lateral ventricles, and basal ganglia are unremarkable in appearance. The cerebral hemispheres are symmetric in appearance, with normal gray-white differentiation. No mass effect or midline shift is seen.  There is no evidence of fracture; visualized osseous structures are unremarkable in appearance. The orbits are within normal limits. The paranasal sinuses and mastoid air cells are well-aerated. Mild soft tissue swelling is noted at the posterior left vertex.  IMPRESSION: 1. No evidence of traumatic intracranial injury or fracture. 2. Mild soft tissue swelling at the posterior left vertex.   Electronically Signed   By: Garald Balding M.D.   On: 09/17/2014 00:13    Scheduled Meds: . carvedilol  3.125 mg Oral BID WC  . [START  ON 09/18/2014] cosyntropin  0.25 mg Intravenous Once  . haloperidol  2 mg Oral QHS  . multivitamin with minerals  1 tablet Oral Daily  . pantoprazole  40 mg Oral BID AC  . sodium chloride  3 mL Intravenous Q12H   Continuous Infusions: . sodium chloride 100 mL/hr at 09/17/14 1224    Principal Problem:   Syncope Active Problems:   Anemia   Menorrhagia   Schizoaffective disorder   Syncope and collapse    Time spent: 30 min    Corrina Steffensen,  [facility] Pager [phone]. If 7PM-7AM, please contact night-coverage at [email or web address], password [facility] 09/17/2014, 5:13 PM

## 2014-09-18 ENCOUNTER — Observation Stay (HOSPITAL_BASED_OUTPATIENT_CLINIC_OR_DEPARTMENT_OTHER): Payer: Self-pay

## 2014-09-18 DIAGNOSIS — D649 Anemia, unspecified: Secondary | ICD-10-CM

## 2014-09-18 DIAGNOSIS — R55 Syncope and collapse: Secondary | ICD-10-CM

## 2014-09-18 DIAGNOSIS — I428 Other cardiomyopathies: Secondary | ICD-10-CM

## 2014-09-18 LAB — TYPE AND SCREEN
ABO/RH(D): AB POS
ANTIBODY SCREEN: NEGATIVE
Unit division: 0
Unit division: 0

## 2014-09-18 LAB — ACTH STIMULATION, 3 TIME POINTS
CORTISOL 30 MIN: 16.8 ug/dL
Cortisol, 60 Min: 18.5 ug/dL
Cortisol, Base: 10.1 ug/dL

## 2014-09-18 LAB — BASIC METABOLIC PANEL
ANION GAP: 6 (ref 5–15)
BUN: <5 mg/dL — ABNORMAL LOW (ref 6–20)
CALCIUM: 9 mg/dL (ref 8.9–10.3)
CO2: 25 mmol/L (ref 22–32)
Chloride: 105 mmol/L (ref 101–111)
Creatinine, Ser: 0.74 mg/dL (ref 0.44–1.00)
Glucose, Bld: 107 mg/dL — ABNORMAL HIGH (ref 65–99)
Potassium: 3.9 mmol/L (ref 3.5–5.1)
Sodium: 136 mmol/L (ref 135–145)

## 2014-09-18 MED ORDER — FERROUS SULFATE 325 (65 FE) MG PO TABS: 325.0000 mg | ORAL_TABLET | Freq: Every day | ORAL | Status: DC

## 2014-09-18 NOTE — Discharge Summary (Signed)
Physician Discharge Summary  Cindy Garrison [id number]:[id number] DOB: 13-Jul-1970 DOA: 09/16/2014  PCP: Harvie Junior, MD  Admit date: 09/16/2014 Discharge date: 09/18/2014  Time spent: greater than 30 minutes  Recommendations for Outpatient Follow-up:  1. Monitor h/h 2. F/u gynecology as outpatient for menorrhagia  Discharge Diagnoses:  Principal Problem:   Syncope Active Problems:   Anemia   Menorrhagia   Schizoaffective disorder   Syncope and collapse   Discharge Condition: stable  Diet recommendation: general  Filed Weights   09/16/14 1700 09/17/14 0545 09/18/14 0621  Weight: 56.836 kg (125 lb 4.8 oz) 57.153 kg (126 lb) 57.516 kg (126 lb 12.8 oz)    History of present illness:  44 y.o. French-speaking [place] female, history of menorrhagia with heavy irregular menstrual bleeds was supposed to be on contraception pills but does not take, schizophrenia, essential hypertension, chronic back pain takes NSAIDs off and on, who has had generalized weakness for the last several weeks, today she went to the bank where there was a long line and she was standing for several minutes, she subsequently felt lightheaded and fell to the floor, she does not recall if she lost consciousness or not. There was no seizure-like activity. No tongue bite no incontinence of bowel or bladder.  She was brought to the ER where her workup showed severe anemia, weakly heme positive stool, patient currently asymptomatic except for generalized weakness and I was called to admit the patient. Patient denies any headache at this time, no chest pain or palpitations, denies any abdominal pain, melena, hematochezia  Hospital Course:  Syncope and collapse due to severe anemia.  - Tele: NSR - S/p 2 units of packed RBC - anemia panel c/w iron deficiency anemia - HIV NR  Iron deficiency anemia probably due to heavy/irregular menses but also had a weakly positive stool guaiac.   - Folate, B12, and TSH  wnl - tx 2 unit PRBC - feraheme infusion  - Start oral ferrous sulfate  Metromenorrhagia - HCG neg F/u gyn as outpatient  Few clue cells and WBC on her wet prep, but no foul odor, symptoms, or discharge to support diagnosis of BV - Observation only and tx if she develops symptoms  Hyponatremia, unusual for an otherwise healthy young person to have such a degree of hyponatremia. This may be due to her haldol.  - TSH: wnl - Check LFTs, cortisol level - BNP mildly elevated, check ECHO  - Cortisol level 4.5, borderline low - Check cosyntropin stim test in AM - Urine sodium and osms are low suggesting possible reaction to rehydration or due to primary polydipsia - Start IVF and repeat BMP in AM  Schizophrenia. Continue Haldol per home dose  Procedures:  none  Consultations:  none  Discharge Exam: Filed Vitals:   09/18/14 0621  BP: 158/74  Pulse: 98  Temp: 98.4 F (36.9 C)  Resp: 18    General: a and o Cardiovascular: RRR Respiratory: CTA  Discharge Instructions   Discharge Instructions    Activity as tolerated - No restrictions    Complete by:  As directed      Diet general    Complete by:  As directed           Current Discharge Medication List    START taking these medications   Details  ferrous sulfate 325 (65 FE) MG tablet Take 1 tablet (325 mg total) by mouth daily with breakfast. Qty: 30 tablet, Refills: 3      CONTINUE these medications which  have NOT CHANGED   Details  diclofenac (VOLTAREN) 75 MG EC tablet Take 75 mg by mouth 2 (two) times daily as needed. For pain    haloperidol (HALDOL) 2 MG tablet Take 2 mg by mouth at bedtime.    lisinopril-hydrochlorothiazide (PRINZIDE,ZESTORETIC) 20-25 MG per tablet Take 1 tablet by mouth daily.    loratadine (CLARITIN) 10 MG tablet Take 10 mg by mouth daily.    Multiple Vitamin (MULTIVITAMIN) tablet Take 1 tablet by mouth daily.    norelgestromin-ethinyl estradiol (ORTHO EVRA)  150-35 MCG/24HR transdermal patch Place 1 patch onto the skin once a week. Qty: 9 patch, Refills: 1   Associated Diagnoses: Encounter for other general counseling or advice on contraception      STOP taking these medications     medroxyPROGESTERone (PROVERA) 10 MG tablet        No Known Allergies Follow-up Information    Follow up with Harvie Junior, MD.   Specialty:  Specialist   Why:  to check blood count   Contact information:   [place] [address] [phone]       Schedule an appointment as soon as possible for a visit with [place].   Why:  for heavy periods   Contact information:   [address] [address] [phone]       The results of significant diagnostics from this hospitalization (including imaging, microbiology, ancillary and laboratory) are listed below for reference.    Significant Diagnostic Studies: Dg Chest 2 View  09/16/2014   CLINICAL DATA:  Syncope.  EXAM: CHEST  2 VIEW  COMPARISON:  None.  FINDINGS: The heart size and mediastinal contours are within normal limits. Both lungs are clear. The visualized skeletal structures are unremarkable.  IMPRESSION: No active cardiopulmonary disease.   Electronically Signed   By: Markus Daft M.D.   On: 09/16/2014 12:07   Ct Head Wo Contrast  09/17/2014   CLINICAL DATA:  Status post fall at bank, hitting back of head. Headache. Initial encounter.  EXAM: CT HEAD WITHOUT CONTRAST  TECHNIQUE: Contiguous axial images were obtained from the base of the skull through the vertex without intravenous contrast.  COMPARISON:  None.  FINDINGS: There is no evidence of acute infarction, mass lesion, or intra- or extra-axial hemorrhage on CT.  The posterior fossa, including the cerebellum, brainstem and fourth ventricle, is within normal limits. The third and lateral ventricles, and basal ganglia are unremarkable in appearance. The cerebral hemispheres are  symmetric in appearance, with normal gray-white differentiation. No mass effect or midline shift is seen.  There is no evidence of fracture; visualized osseous structures are unremarkable in appearance. The orbits are within normal limits. The paranasal sinuses and mastoid air cells are well-aerated. Mild soft tissue swelling is noted at the posterior left vertex.  IMPRESSION: 1. No evidence of traumatic intracranial injury or fracture. 2. Mild soft tissue swelling at the posterior left vertex.   Electronically Signed   By: Garald Balding M.D.   On: 09/17/2014 00:13    Microbiology: Recent Results (from the past 240 hour(s))  Wet prep, genital     Status: Abnormal   Collection Time: 09/16/14  4:52 PM  Result Value Ref Range Status   Yeast Wet Prep HPF POC NONE SEEN NONE SEEN Final   Trich, Wet Prep NONE SEEN NONE SEEN Final   Clue Cells Wet Prep HPF POC FEW (A) NONE SEEN Final   WBC, Wet Prep HPF POC  FEW (A) NONE SEEN Final     Labs: Basic Metabolic Panel:  Recent Labs Lab 09/16/14 1122 09/17/14 0538 09/18/14 0944  NA 133* 129* 136  K 3.3* 4.7 3.9  CL 97* 99* 105  CO2 27 22 25   GLUCOSE 97 80 107*  BUN 8 <5* <5*  CREATININE 0.71 0.68 0.74  CALCIUM 9.2 8.8* 9.0   Liver Function Tests:  Recent Labs Lab 09/17/14 1036  AST 27  ALT 24  ALKPHOS 46  BILITOT 1.2  PROT 6.8  ALBUMIN 3.6   No results for input(s): LIPASE, AMYLASE in the last 168 hours. No results for input(s): AMMONIA in the last 168 hours. CBC:  Recent Labs Lab 09/16/14 1122 09/17/14 0538 09/17/14 1036  WBC 7.6 6.3 10.6*  NEUTROABS 4.7  --  9.0*  HGB 6.1* 10.5* 10.9*  HCT 20.1* 32.4* 33.6*  MCV 50.8* 60.0* 59.9*  PLT 458* PLATELET CLUMPS NOTED ON SMEAR, COUNT APPEARS ADEQUATE 325   Cardiac Enzymes: No results for input(s): CKTOTAL, CKMB, CKMBINDEX, TROPONINI in the last 168 hours. BNP: BNP (last 3 results)  Recent Labs  09/17/14 1036  BNP 156.8*    ProBNP (last 3 results) No results for  input(s): PROBNP in the last 8760 hours.  CBG:  Recent Labs Lab 09/16/14 1337  GLUCAP 73       Signed:  [facility] 09/18/2014, 1:35 PM

## 2014-09-18 NOTE — Progress Notes (Signed)
  Echocardiogram 2D Echocardiogram has been performed.  Cindy Garrison 09/18/2014, 10:32 AM

## 2014-10-05 ENCOUNTER — Emergency Department (HOSPITAL_COMMUNITY)
Admission: EM | Admit: 2014-10-05 | Discharge: 2014-10-05 | Disposition: A | Discharge status: Home / Self Care | Payer: Self-pay | Attending: Emergency Medicine | Admitting: Emergency Medicine

## 2014-10-05 ENCOUNTER — Emergency Department (HOSPITAL_COMMUNITY): Payer: Self-pay

## 2014-10-05 ENCOUNTER — Encounter (HOSPITAL_COMMUNITY): Payer: Self-pay | Admitting: Emergency Medicine

## 2014-10-05 DIAGNOSIS — Z720 Tobacco use: Secondary | ICD-10-CM | POA: Insufficient documentation

## 2014-10-05 DIAGNOSIS — Z79899 Other long term (current) drug therapy: Secondary | ICD-10-CM | POA: Insufficient documentation

## 2014-10-05 DIAGNOSIS — Z872 Personal history of diseases of the skin and subcutaneous tissue: Secondary | ICD-10-CM | POA: Insufficient documentation

## 2014-10-05 DIAGNOSIS — N939 Abnormal uterine and vaginal bleeding, unspecified: Secondary | ICD-10-CM

## 2014-10-05 DIAGNOSIS — D649 Anemia, unspecified: Secondary | ICD-10-CM | POA: Insufficient documentation

## 2014-10-05 DIAGNOSIS — Z8742 Personal history of other diseases of the female genital tract: Secondary | ICD-10-CM | POA: Insufficient documentation

## 2014-10-05 DIAGNOSIS — I10 Essential (primary) hypertension: Secondary | ICD-10-CM | POA: Insufficient documentation

## 2014-10-05 DIAGNOSIS — N938 Other specified abnormal uterine and vaginal bleeding: Secondary | ICD-10-CM | POA: Insufficient documentation

## 2014-10-05 LAB — I-STAT BETA HCG BLOOD, ED (MC, WL, AP ONLY): I-stat hCG, quantitative: <5 m[IU]/mL (ref <5)

## 2014-10-05 LAB — COMPREHENSIVE METABOLIC PANEL
ALBUMIN: 3.5 g/dL (ref 3.5–5.0)
ALK PHOS: 40 U/L (ref 38–126)
ALT: 30 U/L (ref 14–54)
ANION GAP: 10 (ref 5–15)
AST: 27 U/L (ref 15–41)
BUN: 8 mg/dL (ref 6–20)
CALCIUM: 8.7 mg/dL — AB (ref 8.9–10.3)
CO2: 27 mmol/L (ref 22–32)
Chloride: 98 mmol/L — ABNORMAL LOW (ref 101–111)
Creatinine, Ser: 0.63 mg/dL (ref 0.44–1.00)
GFR calc Af Amer: >60 mL/min (ref >60)
GFR calc non Af Amer: >60 mL/min (ref >60)
GLUCOSE: 127 mg/dL — AB (ref 65–99)
Potassium: 3.3 mmol/L — ABNORMAL LOW (ref 3.5–5.1)
SODIUM: 135 mmol/L (ref 135–145)
Total Bilirubin: 0.6 mg/dL (ref 0.3–1.2)
Total Protein: 6.3 g/dL — ABNORMAL LOW (ref 6.5–8.1)

## 2014-10-05 LAB — CBC WITH DIFFERENTIAL/PLATELET
BASOS PCT: 0 %
Basophils Absolute: 0 10*3/uL (ref 0.0–0.1)
EOS ABS: 0.1 10*3/uL (ref 0.0–0.7)
EOS PCT: 1 %
HEMATOCRIT: 33.4 % — AB (ref 36.0–46.0)
HEMOGLOBIN: 10.9 g/dL — AB (ref 12.0–15.0)
LYMPHS PCT: 16 %
Lymphs Abs: 1.1 10*3/uL (ref 0.7–4.0)
MCH: 21.3 pg — AB (ref 26.0–34.0)
MCHC: 32.6 g/dL (ref 30.0–36.0)
MCV: 65.2 fL — AB (ref 78.0–100.0)
Monocytes Absolute: 0.3 10*3/uL (ref 0.1–1.0)
Monocytes Relative: 5 %
NEUTROS ABS: 5.1 10*3/uL (ref 1.7–7.7)
Neutrophils Relative %: 78 %
Platelets: 385 10*3/uL (ref 150–400)
RBC: 5.12 MIL/uL — ABNORMAL HIGH (ref 3.87–5.11)
WBC: 6.6 10*3/uL (ref 4.0–10.5)

## 2014-10-05 LAB — URINALYSIS, ROUTINE W REFLEX MICROSCOPIC
GLUCOSE, UA: 100 mg/dL — AB
Ketones, ur: 40 mg/dL — AB
Nitrite: POSITIVE — AB
PH: 5 (ref 5.0–8.0)
Protein, ur: >300 mg/dL — AB
SPECIFIC GRAVITY, URINE: 1.008 (ref 1.005–1.030)
Urobilinogen, UA: 2 mg/dL — ABNORMAL HIGH (ref 0.0–1.0)

## 2014-10-05 LAB — LIPASE, BLOOD: Lipase: 24 U/L (ref 22–51)

## 2014-10-05 LAB — URINE MICROSCOPIC-ADD ON

## 2014-10-05 MED ORDER — MEDROXYPROGESTERONE ACETATE 150 MG/ML IM SUSP
150.0000 mg | Freq: Once | INTRAMUSCULAR | Status: AC
  Administered 2014-10-05: 150 mg via INTRAMUSCULAR
  Filled 2014-10-05: qty 1

## 2014-10-05 MED ORDER — MEGESTROL ACETATE 40 MG PO TABS: 40.0000 mg | ORAL_TABLET | Freq: Two times a day (BID) | ORAL | Status: DC

## 2014-10-05 NOTE — ED Notes (Addendum)
Pelvic Cart at bedside.  Phlebotomy at bedside.

## 2014-10-05 NOTE — ED Notes (Signed)
Patient transported to US 

## 2014-10-05 NOTE — ED Notes (Signed)
Patient states that she started her period Thursday and yesterday noticed a significantly larger flow, has been consistently changing pads.  She states that within the past hour she has had to change her pad 6 times "every 10 minutes".  Also c/o minor lower abdominal/pelvic pain.  Denies other vaginal dc, dysuria, bowel or bladder problems.  VSS, no acute distress noted.

## 2014-10-05 NOTE — Discharge Instructions (Signed)
Please take Megace twice a day for 7 days. Follow up with Dr. Barnett Abu in 3 weeks. Please return to the Emergency Department if symptoms worsen.

## 2014-10-05 NOTE — ED Provider Notes (Signed)
CSN: [id number]     Arrival date & time 10/05/14  0810 History   First MD Initiated Contact with Patient 10/05/14 [phone]     Chief Complaint  Patient presents with  . Menstrual Problem     (Consider location/radiation/quality/duration/timing/severity/associated sxs/prior Treatment) HPI Comments: Pt is a 44 yo female with PMH of HTN and schizophrenoia who presents to the ED with complaint of vaginal bleeding. Pt reports her menstrual cycle started 2 days ago. She endorses associated mild intermittent lower abdominal pain, no aggravating or alleviating factors. Pt states that her vaginal bleeding increased in amount starting Friday and she has had to change out her pads more frequently. Denies fever, headache, lightheadedness, dizziness, SOB, CP, N/V/D, constipation, blood in urine or stool, urinary sxs, vaginal d/c, weakness. Pt states she is sexually active is not on [facility] and uses condoms intermittently. [id number]. Pt was admitted to the ED on 09/16/14 for anemia, was given 2U blood and was reported to have history of history of menorrhagia with heavy irregular menstrual bleeds. Pt given prescription for ortho evra for menstrual bleeding but pt denies taking BC.    Past Medical History  Diagnosis Date  . #[id number]   . Back pain   . Acne   . Seasonal allergies   . Essential hypertension    History reviewed. No pertinent past surgical history. No family history on file. Social History  Substance Use Topics  . Smoking status: Never Smoker   . Smokeless tobacco: Never Used  . Alcohol Use: No   OB History    No data available     Review of Systems  Gastrointestinal: Positive for abdominal pain.  Genitourinary: Positive for vaginal bleeding.      Allergies  Review of patient's allergies indicates no known allergies.  Home Medications   Prior to Admission medications   Medication Sig Start Date End Date Taking? Authorizing Provider  diclofenac (VOLTAREN) 75 MG EC tablet Take 75 mg by  mouth 2 (two) times daily as needed. For pain   Yes Historical Provider, MD  ferrous sulfate 325 (65 FE) MG tablet Take 1 tablet (325 mg total) by mouth daily with breakfast. 09/18/14  Yes Delfina Redwood, MD  haloperidol (HALDOL) 2 MG tablet Take 2 mg by mouth at bedtime.   Yes Historical Provider, MD  lisinopril-hydrochlorothiazide (PRINZIDE,ZESTORETIC) 20-25 MG per tablet Take 1 tablet by mouth daily.   Yes Historical Provider, MD  loratadine (CLARITIN) 10 MG tablet Take 10 mg by mouth daily.   Yes Historical Provider, MD  Naproxen Sodium (ALEVE) 220 MG CAPS Take 220 mg by mouth every 8 (eight) hours as needed (pain).   Yes Historical Provider, MD  norelgestromin-ethinyl estradiol (ORTHO EVRA) 150-35 MCG/24HR transdermal patch Place 1 patch onto the skin once a week. Patient not taking: Reported on 10/05/2014 05/28/14   Tishira R Brewington, PA-C   BP 123/68 mmHg  Pulse 77  Temp(Src) 97.4 F (36.3 C) (Oral)  Resp 17  Ht 5\' 2"  (1.575 m)  Wt 126 lb (57.153 kg)  BMI 23.04 kg/m2  SpO2 100%  LMP 10/03/2014 Physical Exam  Constitutional: She is oriented to person, place, and time. She appears well-developed and well-nourished. No distress.  HENT:  Head: Normocephalic and atraumatic.  Mouth/Throat: Oropharynx is clear and moist.  Eyes: Conjunctivae and EOM are normal. Pupils are equal, round, and reactive to light. Right eye exhibits no discharge. Left eye exhibits no discharge. No scleral icterus.  Neck: Normal range of motion. Neck supple.  Cardiovascular: Normal rate, regular rhythm, normal heart sounds and intact distal pulses.   Pulmonary/Chest: Effort normal and breath sounds normal. She has no wheezes. She has no rales. She exhibits no tenderness.  Abdominal: Soft. Bowel sounds are normal. She exhibits no distension and no mass. There is tenderness (mild TTP at subrapubic region). There is no rebound and no guarding.  Genitourinary: There is no rash on the right labia. There is no  rash on the left labia. Uterus is enlarged and tender. Uterus is not deviated and not fixed. Cervix exhibits no motion tenderness, no discharge and no friability. Right adnexum displays tenderness and fullness. Right adnexum displays no mass. Left adnexum displays no mass, no tenderness and no fullness. There is bleeding in the vagina. No tenderness in the vagina. No vaginal discharge found.  Musculoskeletal: She exhibits no edema.  Lymphadenopathy:    She has no cervical adenopathy.  Neurological: She is alert and oriented to person, place, and time.  Skin: Skin is warm and dry.  Nursing note and vitals reviewed.   ED Course  Procedures (including critical care time) Labs Review Labs Reviewed  COMPREHENSIVE METABOLIC PANEL  LIPASE, BLOOD  CBC WITH DIFFERENTIAL/PLATELET  URINALYSIS, ROUTINE W REFLEX MICROSCOPIC (NOT AT [facility])  I-STAT BETA HCG BLOOD, ED ([facility], [facility], [facility] ONLY)    Imaging Review No results found. I have personally reviewed and evaluated these images and lab results as part of my medical decision-making.   EKG Interpretation None      Pelvic exam: normal external genitalia, vulva, vagina, cervix, uterus and adnexa, VULVA: normal appearing vulva with no masses, tenderness or lesions, VAGINA: normal appearing vagina with normal color and discharge, no lesions, blood present CERVIX: normal appearing cervix without discharge or lesions, UTERUS: uterus is enlarged and mildly tender, ADNEXA: normal adnexa in size, mild tenderness on right adnexa and no masses, exam chaperoned by Almedia Balls, RN.   MDM   Final diagnoses:  Vaginal bleeding    Pt presents with vaginal bleeding. History of menorrhagia. Admitted in August for anemia associated with vaginal bleeding, given 2U blood. VSS. Mildly tender at suprapubic region with bleeding noted in vaginal vault. Pt is hemodynamically stable. TVUS revealed enlarged uterus involved with fibroids. Consulted OBGYN. Dr. Gala Romney advised to  given the pt Depo provera IM in the ED and give rx for Megace and have the pt follow up in her office in 3 weeks. Pt advised to not take ortho evra. Discussed plan for d/c with pt.   Evaluation does not show pathology requring ongoing emergent intervention or admission. Pt is hemodynamically stable and mentating appropriately. Discussed findings/results and plan with patient/guardian, who agrees with plan. All questions answered. Return precautions discussed and outpatient follow up given.      Chesley Noon [place], [place] 10/05/14 1634  Merrily Pew, MD 10/06/14 [phone]

## 2014-10-30 ENCOUNTER — Encounter: Payer: Self-pay | Admitting: Obstetrics & Gynecology

## 2014-11-22 ENCOUNTER — Ambulatory Visit (INDEPENDENT_AMBULATORY_CARE_PROVIDER_SITE_OTHER): Payer: Self-pay | Admitting: Obstetrics & Gynecology

## 2014-11-22 ENCOUNTER — Encounter: Payer: Self-pay | Admitting: Obstetrics & Gynecology

## 2014-11-22 VITALS — BP 123/81 | HR 89 | Temp 98.7°F | Ht 60.0 in | Wt 126.8 lb

## 2014-11-22 DIAGNOSIS — D259 Leiomyoma of uterus, unspecified: Secondary | ICD-10-CM

## 2014-11-22 DIAGNOSIS — N921 Excessive and frequent menstruation with irregular cycle: Secondary | ICD-10-CM

## 2014-11-22 DIAGNOSIS — D500 Iron deficiency anemia secondary to blood loss (chronic): Secondary | ICD-10-CM | POA: Insufficient documentation

## 2014-11-22 MED ORDER — NORGESTREL-ETHINYL ESTRADIOL 0.3-30 MG-MCG PO TABS: 1.0000 | ORAL_TABLET | Freq: Every day | ORAL | Status: DC

## 2014-11-22 MED ORDER — VALACYCLOVIR HCL 1 G PO TABS: 1000.0000 mg | ORAL_TABLET | Freq: Two times a day (BID) | ORAL | Status: AC

## 2014-11-22 NOTE — Progress Notes (Signed)
   Subjective:    Patient ID: Cindy Garrison, female    DOB: 07/15/1970, 44 y.o.   MRN: [id number]  HPI  This 44 yo S African (French-speaking) lady is here for a ED follow up. She was seen there for heavy periods every 2 weeks. She had a transfusion of 2 units. Her u/s showed large fibroids in her uterus. She took taper dose of OCPs and her bleeding stopped. When she finished, her bleeding resumed. Her most recent hemoglobin was 10.6.  She has been monogamous for 3 months, doesn't use contraception.   Review of Systems Pap and mammogram due    Objective:   Physical Exam  [id number] Interpretor used Breathing, conversing, and ambulating normally Abd- benign, uterus palpable just below umbilicus EG- herpes lesion on left labia majora (She denies pain and was unaware of its existence) Cervix-normal, some small amount of bleeding Mobile enlarged uterus,16 week size      Assessment & Plan:  Menometrorrhagia with fibroids- I offered OCP, depo provera, Mirena, hysterectomy. She prefers to use OCPs. She will start today and use a back up method for a week  Herpes- valtrex prescribed, discussed this condition  She will fill out paperwork for mammogram scholarship [facility]

## 2015-03-23 ENCOUNTER — Emergency Department (INDEPENDENT_AMBULATORY_CARE_PROVIDER_SITE_OTHER)
Admission: EM | Admit: 2015-03-23 | Discharge: 2015-03-23 | Disposition: A | Discharge status: Home / Self Care | Payer: Self-pay | Source: Home / Self Care | Attending: Family Medicine | Admitting: Family Medicine

## 2015-03-23 ENCOUNTER — Encounter (HOSPITAL_COMMUNITY): Payer: Self-pay

## 2015-03-23 ENCOUNTER — Encounter (HOSPITAL_COMMUNITY): Payer: Self-pay | Admitting: Emergency Medicine

## 2015-03-23 ENCOUNTER — Inpatient Hospital Stay (HOSPITAL_COMMUNITY)
Admission: AD | Admit: 2015-03-23 | Discharge: 2015-03-23 | Disposition: A | Discharge status: Home / Self Care | Payer: MEDICAID | Source: Ambulatory Visit | Attending: Obstetrics & Gynecology | Admitting: Obstetrics & Gynecology

## 2015-03-23 DIAGNOSIS — I10 Essential (primary) hypertension: Secondary | ICD-10-CM | POA: Insufficient documentation

## 2015-03-23 DIAGNOSIS — N939 Abnormal uterine and vaginal bleeding, unspecified: Secondary | ICD-10-CM | POA: Insufficient documentation

## 2015-03-23 DIAGNOSIS — N938 Other specified abnormal uterine and vaginal bleeding: Secondary | ICD-10-CM

## 2015-03-23 DIAGNOSIS — N390 Urinary tract infection, site not specified: Secondary | ICD-10-CM | POA: Insufficient documentation

## 2015-03-23 LAB — URINALYSIS, ROUTINE W REFLEX MICROSCOPIC
Bilirubin Urine: NEGATIVE
Glucose, UA: NEGATIVE mg/dL
Ketones, ur: 15 mg/dL — AB
Nitrite: POSITIVE — AB
SPECIFIC GRAVITY, URINE: 1.01 (ref 1.005–1.030)
pH: 7 (ref 5.0–8.0)

## 2015-03-23 LAB — POCT I-STAT, CHEM 8
BUN: 13 mg/dL (ref 6–20)
Calcium, Ion: 1.1 mmol/L — ABNORMAL LOW (ref 1.12–1.23)
Chloride: 98 mmol/L — ABNORMAL LOW (ref 101–111)
Creatinine, Ser: 0.6 mg/dL (ref 0.44–1.00)
Glucose, Bld: 87 mg/dL (ref 65–99)
HCT: 36 % (ref 36.0–46.0)
Hemoglobin: 12.2 g/dL (ref 12.0–15.0)
Potassium: 3.5 mmol/L (ref 3.5–5.1)
Sodium: 136 mmol/L (ref 135–145)
TCO2: 28 mmol/L (ref 0–100)

## 2015-03-23 LAB — URINE MICROSCOPIC-ADD ON

## 2015-03-23 LAB — POCT PREGNANCY, URINE: Preg Test, Ur: NEGATIVE

## 2015-03-23 MED ORDER — CIPROFLOXACIN HCL 500 MG PO TABS: 500.0000 mg | ORAL_TABLET | Freq: Two times a day (BID) | ORAL | Status: DC

## 2015-03-23 MED ORDER — MEGESTROL ACETATE 20 MG PO TABS: 40.0000 mg | ORAL_TABLET | Freq: Two times a day (BID) | ORAL | Status: DC

## 2015-03-23 NOTE — Discharge Instructions (Signed)
[facility] if bleeding continues.

## 2015-03-23 NOTE — ED Notes (Signed)
Vaginal bleeding, particularly the past 2 days.  Patient has a history of the same

## 2015-03-23 NOTE — ED Provider Notes (Signed)
CSN: [id number]     Arrival date & time 03/23/15  1507 History   First MD Initiated Contact with Patient 03/23/15 1712     Chief Complaint  Patient presents with  . Vaginal Bleeding   (Consider location/radiation/quality/duration/timing/severity/associated sxs/prior Treatment) Patient is a 45 y.o. female presenting with vaginal bleeding. The history is provided by the patient.  Vaginal Bleeding Quality:  Heavier than menses and clots Severity:  Moderate Onset quality:  Gradual Duration:  5 months Progression:  Worsening (bleeding worse last 2d and pt became scarred.) Chronicity:  New Number of pads used:  Many Possible pregnancy: no   Context: spontaneously   Relieved by:  None tried Worsened by:  Nothing tried Ineffective treatments:  None tried Associated symptoms: no abdominal pain, no back pain and no vaginal discharge     Past Medical History  Diagnosis Date  . [id number]   . Back pain   . Acne   . Seasonal allergies   . Essential hypertension    History reviewed. No pertinent past surgical history. No family history on file. Social History  Substance Use Topics  . Smoking status: Never Smoker   . Smokeless tobacco: Never Used  . Alcohol Use: No   OB History    Gravida Para Term Preterm AB TAB SAB Ectopic Multiple Living   3 0 0 0 3 0 3 0 0 0      Review of Systems  Constitutional: Negative.   Gastrointestinal: Negative.  Negative for abdominal pain.  Genitourinary: Positive for vaginal bleeding. Negative for vaginal discharge and vaginal pain.  Musculoskeletal: Negative.  Negative for back pain.  All other systems reviewed and are negative.   Allergies  Review of patient's allergies indicates no known allergies.  Home Medications   Prior to Admission medications   Medication Sig Start Date End Date Taking? Authorizing Provider  acetaminophen (TYLENOL) 500 MG tablet Take 1,000 mg by mouth every 6 (six) hours as needed.    Historical Provider, MD   ciprofloxacin (CIPRO) 500 MG tablet Take 1 tablet (500 mg total) by mouth 2 (two) times daily. Patient not taking: Reported on 03/26/2015 03/23/15   Luvenia Redden, PA-C  diclofenac (VOLTAREN) 75 MG EC tablet Take 75 mg by mouth 2 (two) times daily as needed. For pain    Historical Provider, MD  ferrous sulfate 325 (65 FE) MG tablet Take 1 tablet (325 mg total) by mouth daily with breakfast. 09/18/14   Delfina Redwood, MD  fexofenadine-pseudoephedrine (ALLEGRA-D) 60-120 MG 12 hr tablet Take 1 tablet by mouth 2 (two) times daily.    Historical Provider, MD  haloperidol (HALDOL) 2 MG tablet Take 2 mg by mouth at bedtime. Reported on 03/26/2015    Historical Provider, MD  lisinopril-hydrochlorothiazide (PRINZIDE,ZESTORETIC) 20-25 MG per tablet Take 1 tablet by mouth daily.    Historical Provider, MD  loratadine (CLARITIN) 10 MG tablet Take 10 mg by mouth daily. Reported on 03/26/2015    Historical Provider, MD  megestrol (MEGACE) 20 MG tablet Take 2 tablets (40 mg total) by mouth 2 (two) times daily. 03/23/15   Luvenia Redden, PA-C  Naproxen Sod-Diphenhydramine (ALEVE PM) 220-25 MG TABS Take 2 tablets by mouth as needed.    Historical Provider, MD  norgestrel-ethinyl estradiol (LO/OVRAL,CRYSELLE) 0.3-30 MG-MCG tablet Take 1 tablet by mouth daily. Patient not taking: Reported on 03/26/2015 11/22/14   Emily Filbert, MD  OLANZapine (ZYPREXA) 5 MG tablet Take 5 mg by mouth at bedtime.    Historical Provider,  MD   Meds Ordered and Administered this Visit  Medications - No data to display  BP 141/83 mmHg  Pulse 95  Temp(Src) 98.1 F (36.7 C) (Oral)  Resp 14  SpO2 100% No data found.   Physical Exam  Constitutional: She is oriented to person, place, and time. She appears well-developed and well-nourished. No distress.  Abdominal: Soft. Bowel sounds are normal. She exhibits no mass. There is no tenderness.  Neurological: She is alert and oriented to person, place, and time.  Skin: Skin is warm and dry.   Nursing note and vitals reviewed.   ED Course  Procedures (including critical care time)  Labs Review Labs Reviewed  POCT I-STAT, CHEM 8 - Abnormal; Notable for the following:    Chloride 98 (*)    Calcium, Ion 1.10 (*)    All other components within normal limits   i-stat wnl.  Imaging Review No results found.   Visual Acuity Review  Right Eye Distance:   Left Eye Distance:   Bilateral Distance:    Right Eye Near:   Left Eye Near:    Bilateral Near:         MDM   1. DUB (dysfunctional uterine bleeding)        Billy Fischer, MD 04/04/15 2110

## 2015-03-23 NOTE — Discharge Instructions (Signed)

## 2015-03-23 NOTE — MAU Provider Note (Signed)
History     CSN: [id number]  Arrival date and time: 03/23/15 [id number]   First Provider Initiated Contact with Patient 03/23/15 2005      Chief Complaint  Patient presents with  . Vaginal Bleeding   HPI Cindy Garrison is a 45 y.o. G3P0030 who presents to MAU today with complaint of vaginal bleeding x 6 months. The patient states daily bleeding since onset, but more x 2 days. She is unable to give a number a pads used today because it is too many. She denies pain. She is taking iron supplements. She denies feeling weak, dizzy or faint.    OB History    Gravida Para Term Preterm AB TAB SAB Ectopic Multiple Living   3 0 0 0 3 0 3 0 0 0       Past Medical History  Diagnosis Date  . [id number]   . Back pain   . Acne   . Seasonal allergies   . Essential hypertension     No past surgical history on file.  No family history on file.  Social History  Substance Use Topics  . Smoking status: Never Smoker   . Smokeless tobacco: Never Used  . Alcohol Use: No    Allergies: No Known Allergies  No prescriptions prior to admission    Review of Systems  Constitutional: Negative for fever and malaise/fatigue.  Gastrointestinal: Negative for abdominal pain.  Genitourinary: Negative for dysuria, urgency and frequency.       + vaginal bleeding  Neurological: Negative for dizziness, loss of consciousness and weakness.   Physical Exam   Blood pressure 146/81, pulse 97, temperature 97.4 F (36.3 C), temperature source Oral, resp. rate 16.  Physical Exam  Nursing note and vitals reviewed. Constitutional: She is oriented to person, place, and time. She appears well-developed and well-nourished. No distress.  HENT:  Head: Normocephalic and atraumatic.  Cardiovascular: Normal rate.   Respiratory: Effort normal.  GI: Soft. She exhibits no distension and no mass. There is no tenderness. There is no rebound and no guarding.  Neurological: She is alert and oriented to person, place,  and time.  Skin: Skin is warm and dry. No erythema.  Psychiatric: She has a normal mood and affect.   Results for orders placed or performed during the hospital encounter of 03/23/15 (from the past 24 hour(s))  Urinalysis, Routine w reflex microscopic (not at [facility])     Status: Abnormal   Collection Time: 03/23/15  7:25 PM  Result Value Ref Range   Color, Urine RED (A) YELLOW   APPearance HAZY (A) CLEAR   Specific Gravity, Urine 1.010 1.005 - 1.030   pH 7.0 5.0 - 8.0   Glucose, UA NEGATIVE NEGATIVE mg/dL   Hgb urine dipstick LARGE (A) NEGATIVE   Bilirubin Urine NEGATIVE NEGATIVE   Ketones, ur 15 (A) NEGATIVE mg/dL   Protein, ur >300 (A) NEGATIVE mg/dL   Nitrite POSITIVE (A) NEGATIVE   Leukocytes, UA TRACE (A) NEGATIVE  Urine microscopic-add on     Status: Abnormal   Collection Time: 03/23/15  7:25 PM  Result Value Ref Range   Squamous Epithelial / LPF 0-5 (A) NONE SEEN   WBC, UA 0-5 0 - 5 WBC/hpf   RBC / HPF TOO NUMEROUS TO COUNT 0 - 5 RBC/hpf   Bacteria, UA MANY (A) NONE SEEN  Pregnancy, urine POC     Status: None   Collection Time: 03/23/15  7:51 PM  Result Value Ref Range   Preg Test,  Ur NEGATIVE NEGATIVE    MAU Course  Procedures None  MDM UPT - negative Results reviewed from Urgent care Hgb is normal Patient is hemodynamically stable. Will complete work-up for AUB as outpatient.   Assessment and Plan  A: Abnormal uterine bleeding UTI   P: Discharge home Rx for Megace given to patient Rx for Cipro sent to patient's pharmacy Bleeding precautions discussed Outpatient Cindy Garrison ordered for this week Patient advised to follow-up with Cindy Garrison on 03/31/15 at 1:00 pm for further evaluation and management of AUB Patient may return to MAU as needed or if her condition were to change or worsen   Cindy Redden, PA-C  03/23/2015, 8:38 PM

## 2015-03-23 NOTE — MAU Note (Signed)
Patient presents with c/o vaginal bleeding for 6 months.

## 2015-03-24 ENCOUNTER — Telehealth: Payer: Self-pay

## 2015-03-26 ENCOUNTER — Ambulatory Visit (INDEPENDENT_AMBULATORY_CARE_PROVIDER_SITE_OTHER): Payer: Self-pay | Admitting: Obstetrics and Gynecology

## 2015-03-26 ENCOUNTER — Other Ambulatory Visit (HOSPITAL_COMMUNITY)
Admission: RE | Admit: 2015-03-26 | Discharge: 2015-03-26 | Disposition: A | Discharge status: Home / Self Care | Payer: MEDICAID | Source: Ambulatory Visit | Attending: Obstetrics and Gynecology | Admitting: Obstetrics and Gynecology

## 2015-03-26 ENCOUNTER — Encounter: Payer: Self-pay | Admitting: Obstetrics and Gynecology

## 2015-03-26 ENCOUNTER — Ambulatory Visit (HOSPITAL_COMMUNITY)
Admission: RE | Admit: 2015-03-26 | Discharge: 2015-03-26 | Disposition: A | Discharge status: Home / Self Care | Payer: MEDICAID | Source: Ambulatory Visit | Attending: Medical | Admitting: Medical

## 2015-03-26 VITALS — BP 154/87 | HR 97 | Temp 98.2°F

## 2015-03-26 DIAGNOSIS — N938 Other specified abnormal uterine and vaginal bleeding: Secondary | ICD-10-CM | POA: Insufficient documentation

## 2015-03-26 DIAGNOSIS — D259 Leiomyoma of uterus, unspecified: Secondary | ICD-10-CM | POA: Insufficient documentation

## 2015-03-26 DIAGNOSIS — N939 Abnormal uterine and vaginal bleeding, unspecified: Secondary | ICD-10-CM | POA: Insufficient documentation

## 2015-03-26 NOTE — Progress Notes (Signed)
Patient ID: Cindy Garrison, female   DOB: 26-Apr-1970, 45 y.o.   MRN: [id number] 45 yo G3P0030 here for evaluation of abnormal uterine bleeding. Patient reports a long history of DUB. She was recently seen in the ED and prescribed Megace. She has not been taking the megace as prescribed and thus has not seen an improvement in her vaginal bleeding. She reports heavy vaginal bleeding for 2-3 months at a time but this past period has lasted 6 months. She described changing a pad every 10 minutes prior to taking the Megace and now she changes 2-3 pads per day.   Past Medical History  Diagnosis Date  . [id number]   . Back pain   . Acne   . Seasonal allergies   . Essential hypertension    No past surgical history on file. No family history on file. Social History  Substance Use Topics  . Smoking status: Never Smoker   . Smokeless tobacco: Never Used  . Alcohol Use: No   ROS See pertinent in HPI  Blood pressure 154/87, pulse 97, temperature 98.2 F (36.8 C), temperature source Oral. GENERAL: Well-developed, well-nourished female in no acute distress.  ABDOMEN: Soft, nontender, nondistended. No organomegaly. PELVIC: Normal external female genitalia. Vagina is pink and rugated.  Normal discharge. Normal appearing cervix. Uterus is normal in size. No adnexal mass or tenderness. EXTREMITIES: No cyanosis, clubbing, or edema, 2+ distal pulses.  03/26/2015 Ultrasound FINDINGS: Uterus  Measurements: 12.3 x 6.0 x 13.2 cm. Multiple fibroids are present with the largest measuring 6 cm. Present.  Endometrium  Thickness: 19 mm. A 3.1 cm endometrial or submucosal mass is noted.  Right ovary  Measurements: 3.4 x 2.2 x 2.3 cm. Normal appearance/no adnexal mass.  Left ovary  Measurements: 3.5 x 2.6 x 3.5 cm. Normal appearance/no adnexal mass.  Other findings  No abnormal free fluid.  IMPRESSION: 1. Multiple fibroids.  2. Endometrial thickening to 19 mm. A 3.1 cm endometrial  or submucosal mass is noted.  A focal endometrial lesion issuspected. Consider sonohysterogram forfurther evaluation, prior to hysteroscopy or endometrial biopsy.  A/P 45 yo with DUB and fibroid uterus - Discussed the benefits of an endometrial biopsy to rule out malignancy ENDOMETRIAL BIOPSY     The indications for endometrial biopsy were reviewed.   Risks of the biopsy including cramping, bleeding, infection, uterine perforation, inadequate specimen and need for additional procedures  were discussed. The patient states she understands and agrees to undergo procedure today. Consent was signed. Time out was performed. Urine HCG was negative. A sterile speculum was placed in the patient's vagina and the cervix was prepped with Betadine. A single-toothed tenaculum was placed on the anterior lip of the cervix to stabilize it. The uterine cavity was sounded to a depth of 14 cm using the uterine sound. The 3 mm pipelle was introduced into the endometrial cavity without difficulty, 2 passes were made.  A  moderate amount of tissue was  sent to pathology. The instruments were removed from the patient's vagina. Minimal bleeding from the cervix was noted. The patient tolerated the procedure well.  Routine post-procedure instructions were given to the patient. The patient will follow up in two weeks to review the results and for further management.   - Patient will return in 2 weeks to discuss results and further management

## 2015-03-27 NOTE — Telephone Encounter (Signed)
ERROR

## 2015-03-31 ENCOUNTER — Ambulatory Visit: Payer: Self-pay | Admitting: Obstetrics & Gynecology

## 2015-04-01 ENCOUNTER — Telehealth: Payer: Self-pay

## 2015-04-01 NOTE — Telephone Encounter (Signed)
Please inform patient of negative endometrial biopsy. She should keep her appointment for further management of her abnormal uterine bleeding    I have left a message for patient to returned call the clinic regarding biopsy.

## 2015-04-07 ENCOUNTER — Encounter: Payer: Self-pay | Admitting: Obstetrics & Gynecology

## 2015-04-07 ENCOUNTER — Ambulatory Visit (INDEPENDENT_AMBULATORY_CARE_PROVIDER_SITE_OTHER): Payer: Self-pay | Admitting: Obstetrics & Gynecology

## 2015-04-07 VITALS — BP 149/88 | HR 97 | Temp 98.5°F | Wt 134.2 lb

## 2015-04-07 DIAGNOSIS — N939 Abnormal uterine and vaginal bleeding, unspecified: Secondary | ICD-10-CM

## 2015-04-07 MED ORDER — MEGESTROL ACETATE 20 MG PO TABS: 40.0000 mg | ORAL_TABLET | Freq: Two times a day (BID) | ORAL | Status: DC

## 2015-04-07 NOTE — Progress Notes (Signed)
Patient ID: Cindy Garrison, female   DOB: 1970/05/12, 45 y.o.   MRN: [id number]  [place] video interpreter "Nadege" [phone] used for visit.

## 2015-04-07 NOTE — Patient Instructions (Signed)

## 2015-04-07 NOTE — Progress Notes (Signed)
Patient ID: Cindy Garrison, female   DOB: 1970/11/07, 45 y.o.   MRN: [id number] History:  45 y.o. G3P0030 here today for f/u of AUB and review of endo bx results. Pt denies dizziness or SOB.  Bleeding is improved since her last visit but, not resolved at present. Pt reports that she began meds after her visit 3/7.  The following portions of the patient's history were reviewed and updated as appropriate: allergies, current medications, past family history, past medical history, past social history, past surgical history and problem list.  Review of Systems:  Pertinent items are noted in HPI.  Objective:  Physical Exam Blood pressure 149/88, pulse 97, temperature 98.5 F (36.9 C), temperature source Oral, weight 134 lb 3.2 oz (60.873 kg), last menstrual period 03/26/2015. Gen: NAD Exam deferred  Labs and Imaging US Transvaginal Non-ob  03/26/2015  CLINICAL DATA:  Vaginal bleeding. EXAM: TRANSABDOMINAL AND TRANSVAGINAL ULTRASOUND OF PELVIS TECHNIQUE: Both transabdominal and transvaginal ultrasound examinations of the pelvis were performed. Transabdominal technique was performed for global imaging of the pelvis including uterus, ovaries, adnexal regions, and pelvic cul-de-sac. It was necessary to proceed with endovaginal exam following the transabdominal exam to visualize the uterus and ovaries. COMPARISON:  10/05/2014. FINDINGS: Uterus Measurements: 12.3 x 6.0 x 13.2 cm. Multiple fibroids are present with the largest measuring 6 cm. Present. Endometrium Thickness: 19 mm.  A 3.1 cm endometrial or submucosal mass is noted. Right ovary Measurements: 3.4 x 2.2 x 2.3 cm. Normal appearance/no adnexal mass. Left ovary Measurements: 3.5 x 2.6 x 3.5 cm. Normal appearance/no adnexal mass. Other findings No abnormal free fluid. IMPRESSION: 1. Multiple fibroids. 2. Endometrial thickening to 19 mm. A 3.1 cm endometrial or submucosal mass is noted. A focal endometrial lesion issuspected. Consider sonohysterogram  forfurther evaluation, prior to hysteroscopy or endometrial biopsy. Electronically Signed   By: Marcello Moores  Register   On: 03/26/2015 14:40   US Pelvis Complete  03/26/2015  CLINICAL DATA:  Vaginal bleeding. EXAM: TRANSABDOMINAL AND TRANSVAGINAL ULTRASOUND OF PELVIS TECHNIQUE: Both transabdominal and transvaginal ultrasound examinations of the pelvis were performed. Transabdominal technique was performed for global imaging of the pelvis including uterus, ovaries, adnexal regions, and pelvic cul-de-sac. It was necessary to proceed with endovaginal exam following the transabdominal exam to visualize the uterus and ovaries. COMPARISON:  10/05/2014. FINDINGS: Uterus Measurements: 12.3 x 6.0 x 13.2 cm. Multiple fibroids are present with the largest measuring 6 cm. Present. Endometrium Thickness: 19 mm.  A 3.1 cm endometrial or submucosal mass is noted. Right ovary Measurements: 3.4 x 2.2 x 2.3 cm. Normal appearance/no adnexal mass. Left ovary Measurements: 3.5 x 2.6 x 3.5 cm. Normal appearance/no adnexal mass. Other findings No abnormal free fluid. IMPRESSION: 1. Multiple fibroids. 2. Endometrial thickening to 19 mm. A 3.1 cm endometrial or submucosal mass is noted. A focal endometrial lesion issuspected. Consider sonohysterogram forfurther evaluation, prior to hysteroscopy or endometrial biopsy. Electronically Signed   By: Marcello Moores  Register   On: 03/26/2015 14:40   03/26/2015 Diagnosis Endometrium, biopsy DETACHED SCANT INACTIVE ENDOMETRIUM NEGATIVE FOR MALIGNANCY  Assessment & Plan:  AUB- Cont Megace 40mg  bid  F/u in 3 months or sooner prn Reviewed results of Endo bx Video [place] interpreter, Lelan Pons, used for entire [employer]. Harraway-Smith, M.D., Cherlynn June

## 2015-07-17 ENCOUNTER — Ambulatory Visit: Payer: Self-pay | Admitting: Obstetrics & Gynecology

## 2015-07-21 ENCOUNTER — Inpatient Hospital Stay (HOSPITAL_COMMUNITY)
Admission: AD | Admit: 2015-07-21 | Discharge: 2015-07-21 | Disposition: A | Discharge status: Home / Self Care | Payer: Self-pay | Source: Ambulatory Visit | Attending: Obstetrics and Gynecology | Admitting: Obstetrics and Gynecology

## 2015-07-21 ENCOUNTER — Encounter (HOSPITAL_COMMUNITY): Payer: Self-pay | Admitting: *Deleted

## 2015-07-21 DIAGNOSIS — Z86018 Personal history of other benign neoplasm: Secondary | ICD-10-CM

## 2015-07-21 DIAGNOSIS — I10 Essential (primary) hypertension: Secondary | ICD-10-CM | POA: Insufficient documentation

## 2015-07-21 DIAGNOSIS — D259 Leiomyoma of uterus, unspecified: Secondary | ICD-10-CM | POA: Insufficient documentation

## 2015-07-21 DIAGNOSIS — N939 Abnormal uterine and vaginal bleeding, unspecified: Secondary | ICD-10-CM | POA: Insufficient documentation

## 2015-07-21 DIAGNOSIS — Z3202 Encounter for pregnancy test, result negative: Secondary | ICD-10-CM | POA: Insufficient documentation

## 2015-07-21 LAB — CBC
HCT: 35.5 % — ABNORMAL LOW (ref 36.0–46.0)
HEMOGLOBIN: 13.1 g/dL (ref 12.0–15.0)
MCH: 25.8 pg — AB (ref 26.0–34.0)
MCHC: 36.9 g/dL — ABNORMAL HIGH (ref 30.0–36.0)
MCV: 70 fL — AB (ref 78.0–100.0)
PLATELETS: 301 10*3/uL (ref 150–400)
RBC: 5.07 MIL/uL (ref 3.87–5.11)
RDW: 15 % (ref 11.5–15.5)
WBC: 6.7 10*3/uL (ref 4.0–10.5)

## 2015-07-21 LAB — URINALYSIS, ROUTINE W REFLEX MICROSCOPIC
BILIRUBIN URINE: NEGATIVE
Glucose, UA: NEGATIVE mg/dL
KETONES UR: NEGATIVE mg/dL
NITRITE: NEGATIVE
PH: 6.5 (ref 5.0–8.0)
Protein, ur: 30 mg/dL — AB

## 2015-07-21 LAB — URINE MICROSCOPIC-ADD ON

## 2015-07-21 LAB — POCT PREGNANCY, URINE: PREG TEST UR: NEGATIVE

## 2015-07-21 MED ORDER — FERROUS SULFATE 325 (65 FE) MG PO TABS: 325.0000 mg | ORAL_TABLET | Freq: Every day | ORAL | Status: DC

## 2015-07-21 NOTE — Discharge Instructions (Signed)

## 2015-07-21 NOTE — MAU Provider Note (Signed)
History     CSN: [id number]  Arrival date and time: 07/21/15 [id number]   First Provider Initiated Contact with Patient 07/21/15 1949      Chief Complaint  Patient presents with  . Vaginal Bleeding   HPI Cindy Garrison is a 45 y.o. female who presents for vaginal bleeding. Patient is being followed in the [facility] hospital clinic for fibroids & AUB. Has been taking megace for bleeidng & has f/u appt scheduled with Dr. Ihor Dow next month.  Was initially prescribed megace 40 mg BID; pt has adjusted the dose herself d/t continue bleeding & is now taking 40 mg QID. Pt concerned b/c she was bleeding heavily at work & is unable to take extra pads with her to work d/t rules regarding purses in the work place.  Does not saturate pads. Denies weakness, CP, SOB, palpitations, or abdominal pain.   [place] video interpreter used.   OB History    Gravida Para Term Preterm AB TAB SAB Ectopic Multiple Living   3 0 0 0 3 0 3 0 0 0       Past Medical History  Diagnosis Date  . [id number]   . Back pain   . Acne   . Seasonal allergies   . Essential hypertension     History reviewed. No pertinent past surgical history.  History reviewed. No pertinent family history.  Social History  Substance Use Topics  . Smoking status: Never Smoker   . Smokeless tobacco: Never Used  . Alcohol Use: No    Allergies: No Known Allergies  Prescriptions prior to admission  Medication Sig Dispense Refill Last Dose  . acetaminophen (TYLENOL) 500 MG tablet Take 1,000 mg by mouth every 6 (six) hours as needed.   07/21/2015 at Unknown time  . diclofenac (VOLTAREN) 75 MG EC tablet Take 75 mg by mouth 2 (two) times daily as needed. For pain   07/21/2015 at Unknown time  . ferrous sulfate 325 (65 FE) MG tablet Take 1 tablet (325 mg total) by mouth daily with breakfast. 30 tablet 3 07/21/2015 at Unknown time  . lisinopril-hydrochlorothiazide (PRINZIDE,ZESTORETIC) 20-25 MG per tablet Take 1 tablet by mouth daily.    07/21/2015 at Unknown time  . megestrol (MEGACE) 20 MG tablet Take 2 tablets (40 mg total) by mouth 2 (two) times daily. 160 tablet 3 07/21/2015 at Unknown time  . ciprofloxacin (CIPRO) 500 MG tablet Take 1 tablet (500 mg total) by mouth 2 (two) times daily. (Patient not taking: Reported on 03/26/2015) 6 tablet 0 Not Taking  . fexofenadine-pseudoephedrine (ALLEGRA-D) 60-120 MG 12 hr tablet Take 1 tablet by mouth 2 (two) times daily.   Taking  . haloperidol (HALDOL) 2 MG tablet Take 2 mg by mouth at bedtime. Reported on 03/26/2015   Taking  . loratadine (CLARITIN) 10 MG tablet Take 10 mg by mouth daily. Reported on 03/26/2015   Taking  . norgestrel-ethinyl estradiol (LO/OVRAL,CRYSELLE) 0.3-30 MG-MCG tablet Take 1 tablet by mouth daily. (Patient not taking: Reported on 03/26/2015) 1 Package 11 Not Taking    Review of Systems  Constitutional: Negative.   Cardiovascular: Negative.   Gastrointestinal: Negative.   Genitourinary:       + vaginal bleeding  Neurological: Negative for headaches.   Physical Exam   Blood pressure 120/76, pulse 89, temperature 98.2 F (36.8 C), temperature source Oral, resp. rate 18.  Physical Exam  Nursing note and vitals reviewed. Constitutional: She is oriented to person, place, and time. She appears well-developed and well-nourished. No distress.  HENT:  Head: Normocephalic and atraumatic.  Eyes: Conjunctivae are normal. Right eye exhibits no discharge. Left eye exhibits no discharge. No scleral icterus.  Neck: Normal range of motion.  Cardiovascular: Normal rate, regular rhythm and normal heart sounds.   No murmur heard. Respiratory: Effort normal and breath sounds normal. No respiratory distress. She has no wheezes.  GI: Soft.  Genitourinary:  Minimal amount of brown blood on pad  Neurological: She is alert and oriented to person, place, and time.  Skin: Skin is warm and dry. She is not diaphoretic.  Psychiatric: She has a normal mood and affect. Her behavior is  normal. Judgment and thought content normal.    MAU Course  Procedures Results for orders placed or performed during the hospital encounter of 07/21/15 (from the past 24 hour(s))  Urinalysis, Routine w reflex microscopic (not at [facility])     Status: Abnormal   Collection Time: 07/21/15  6:26 PM  Result Value Ref Range   Color, Urine RED (A) YELLOW   APPearance CLOUDY (A) CLEAR   Specific Gravity, Urine <1.005 (L) 1.005 - 1.030   pH 6.5 5.0 - 8.0   Glucose, UA NEGATIVE NEGATIVE mg/dL   Hgb urine dipstick LARGE (A) NEGATIVE   Bilirubin Urine NEGATIVE NEGATIVE   Ketones, ur NEGATIVE NEGATIVE mg/dL   Protein, ur 30 (A) NEGATIVE mg/dL   Nitrite NEGATIVE NEGATIVE   Leukocytes, UA TRACE (A) NEGATIVE  Urine microscopic-add on     Status: Abnormal   Collection Time: 07/21/15  6:26 PM  Result Value Ref Range   Squamous Epithelial / LPF 0-5 (A) NONE SEEN   WBC, UA 6-30 0 - 5 WBC/hpf   RBC / HPF TOO NUMEROUS TO COUNT 0 - 5 RBC/hpf   Bacteria, UA RARE (A) NONE SEEN   Urine-Other URINALYSIS PERFORMED ON SUPERNATANT   Pregnancy, urine POC     Status: None   Collection Time: 07/21/15  6:48 PM  Result Value Ref Range   Preg Test, Ur NEGATIVE NEGATIVE  CBC     Status: Abnormal   Collection Time: 07/21/15  6:50 PM  Result Value Ref Range   WBC 6.7 4.0 - 10.5 K/uL   RBC 5.07 3.87 - 5.11 MIL/uL   Hemoglobin 13.1 12.0 - 15.0 g/dL   HCT 35.5 (L) 36.0 - 46.0 %   MCV 70.0 (L) 78.0 - 100.0 fL   MCH 25.8 (L) 26.0 - 34.0 pg   MCHC 36.9 (H) 30.0 - 36.0 g/dL   RDW 15.0 11.5 - 15.5 %   Platelets 301 150 - 400 K/uL    MDM UPT negative VSS CBC -- hemoglobin normal Minimal amount of bleeding on exam Will have pt continue current dosage of Megace as it is controlling her bleeding adequately Assessment and Plan  A: 1. History of uterine leiomyoma   2. Abnormal uterine bleeding (AUB)     P: Discharge home Take megace 80 mg BID  Keep f/u appt with Dr. Ihor Dow next month Continue ferrous  sulfate Discussed reasons to return to [place] 07/21/2015, 7:49 PM

## 2015-07-21 NOTE — MAU Note (Addendum)
Pain in lower abdomin and bleeding since this morning. Took Megestrol (4 tablets 20mg  each) today but did not work at all. Patient states she is not hurting now.

## 2015-07-31 ENCOUNTER — Inpatient Hospital Stay (HOSPITAL_COMMUNITY)
Admission: AD | Admit: 2015-07-31 | Discharge: 2015-07-31 | Disposition: A | Discharge status: Home / Self Care | Payer: Self-pay | Source: Ambulatory Visit | Attending: Obstetrics & Gynecology | Admitting: Obstetrics & Gynecology

## 2015-07-31 ENCOUNTER — Encounter (HOSPITAL_COMMUNITY): Payer: Self-pay

## 2015-07-31 DIAGNOSIS — R710 Precipitous drop in hematocrit: Secondary | ICD-10-CM | POA: Insufficient documentation

## 2015-07-31 DIAGNOSIS — N921 Excessive and frequent menstruation with irregular cycle: Secondary | ICD-10-CM

## 2015-07-31 DIAGNOSIS — N920 Excessive and frequent menstruation with regular cycle: Secondary | ICD-10-CM | POA: Insufficient documentation

## 2015-07-31 DIAGNOSIS — N938 Other specified abnormal uterine and vaginal bleeding: Secondary | ICD-10-CM | POA: Insufficient documentation

## 2015-07-31 DIAGNOSIS — M549 Dorsalgia, unspecified: Secondary | ICD-10-CM | POA: Insufficient documentation

## 2015-07-31 DIAGNOSIS — I10 Essential (primary) hypertension: Secondary | ICD-10-CM | POA: Insufficient documentation

## 2015-07-31 LAB — CBC
HEMATOCRIT: 31.2 % — AB (ref 36.0–46.0)
Hemoglobin: 11.3 g/dL — ABNORMAL LOW (ref 12.0–15.0)
MCH: 25.3 pg — AB (ref 26.0–34.0)
MCHC: 36.2 g/dL — ABNORMAL HIGH (ref 30.0–36.0)
MCV: 69.8 fL — AB (ref 78.0–100.0)
PLATELETS: 455 10*3/uL — AB (ref 150–400)
RBC: 4.47 MIL/uL (ref 3.87–5.11)
RDW: 14.8 % (ref 11.5–15.5)
WBC: 10.7 10*3/uL — ABNORMAL HIGH (ref 4.0–10.5)

## 2015-07-31 LAB — BASIC METABOLIC PANEL
Anion gap: 8 (ref 5–15)
BUN: 11 mg/dL (ref 6–20)
CHLORIDE: 97 mmol/L — AB (ref 101–111)
CO2: 24 mmol/L (ref 22–32)
Calcium: 9.3 mg/dL (ref 8.9–10.3)
Creatinine, Ser: 0.54 mg/dL (ref 0.44–1.00)
GFR calc Af Amer: >60 mL/min (ref >60)
GLUCOSE: 101 mg/dL — AB (ref 65–99)
POTASSIUM: 3.5 mmol/L (ref 3.5–5.1)
Sodium: 129 mmol/L — ABNORMAL LOW (ref 135–145)

## 2015-07-31 LAB — POCT PREGNANCY, URINE: Preg Test, Ur: NEGATIVE

## 2015-07-31 MED ORDER — MEGESTROL ACETATE 40 MG PO TABS: 40.0000 mg | ORAL_TABLET | Freq: Four times a day (QID) | ORAL | Status: DC

## 2015-07-31 MED ORDER — OXYCODONE-ACETAMINOPHEN 5-325 MG PO TABS: 1.0000 | ORAL_TABLET | Freq: Four times a day (QID) | ORAL | Status: DC | PRN

## 2015-07-31 MED ORDER — OXYCODONE-ACETAMINOPHEN 5-325 MG PO TABS
1.0000 | ORAL_TABLET | Freq: Once | ORAL | Status: AC
  Administered 2015-07-31: 1 via ORAL
  Filled 2015-07-31: qty 1

## 2015-07-31 MED ORDER — IBUPROFEN 600 MG PO TABS: 600.0000 mg | ORAL_TABLET | Freq: Four times a day (QID) | ORAL | Status: DC | PRN

## 2015-07-31 MED ORDER — MEGESTROL ACETATE 40 MG PO TABS
40.0000 mg | ORAL_TABLET | Freq: Once | ORAL | Status: AC
  Administered 2015-07-31: 40 mg via ORAL
  Filled 2015-07-31: qty 1

## 2015-07-31 MED ORDER — IBUPROFEN 600 MG PO TABS
600.0000 mg | ORAL_TABLET | Freq: Once | ORAL | Status: AC
  Administered 2015-07-31: 600 mg via ORAL
  Filled 2015-07-31: qty 1

## 2015-07-31 NOTE — Discharge Instructions (Signed)

## 2015-07-31 NOTE — MAU Note (Signed)
Pt reports having vaginal bleeding had abd . Has been bleeidng for a year. Had not bled since about a week ago. Was given megace.

## 2015-07-31 NOTE — MAU Provider Note (Signed)
Chief Complaint:  Vaginal Bleeding   First Provider Initiated Contact with Patient 07/31/15 1821     HPI  Cindy Garrison is a 45 y.o. G3P0030 who presents to maternity admissions reporting vaginal bleeding and states it is not better.  Has been bleeding for a year. Has been treated with Megace and states the bleeding restarted this week "and the medicine is not working".   Has a clinic visit upcoming with Dr Ihor Dow.  Wants a letter for work saying she cannot work as fast as they are requiring to work.  States they wont let her bring pads to work "because we cannot bring a purse in".  Also states cramps are very bad. She reports vaginal bleeding, no vaginal itching/burning, urinary symptoms, h/a, dizziness, n/v, or fever/chills.    While here, patient goes to the bathroom about every 5-10 minutes and changes pads, not saturated.  RN Note: Pt reports having vaginal bleeding had abd . Has been bleeidng for a year. Had not bled since about a week ago. Was given megace.  Past Medical History: Past Medical History  Diagnosis Date  . [id number]   . Back pain   . Acne   . Seasonal allergies   . Essential hypertension     Past obstetric history: OB History  Gravida Para Term Preterm AB SAB TAB Ectopic Multiple Living  3 0 0 0 3 3 0 0 0 0     # Outcome Date GA Lbr Len/2nd Weight Sex Delivery Anes PTL Lv  3 SAB           2 SAB           1 SAB               Past Surgical History: Past Surgical History  Procedure Laterality Date  . No past surgeries      Family History: No family history on file.  Social History: Social History  Substance Use Topics  . Smoking status: Never Smoker   . Smokeless tobacco: Never Used  . Alcohol Use: No    Allergies: No Known Allergies  Meds:  Prescriptions prior to admission  Medication Sig Dispense Refill Last Dose  . acetaminophen (TYLENOL) 500 MG tablet Take 1,000 mg by mouth every 6 (six) hours as needed.   07/21/2015 at Unknown time   . diclofenac (VOLTAREN) 75 MG EC tablet Take 75 mg by mouth 2 (two) times daily as needed. For pain   07/21/2015 at Unknown time  . diphenhydramine-acetaminophen (TYLENOL PM) 25-500 MG TABS tablet Take 1 tablet by mouth at bedtime.   07/20/2015 at Unknown time  . ferrous sulfate 325 (65 FE) MG tablet Take 1 tablet (325 mg total) by mouth daily with breakfast. 30 tablet 3   . fexofenadine-pseudoephedrine (ALLEGRA-D) 60-120 MG 12 hr tablet Take 1 tablet by mouth 2 (two) times daily.   07/21/2015 at Unknown time  . lisinopril-hydrochlorothiazide (PRINZIDE,ZESTORETIC) 20-25 MG per tablet Take 1 tablet by mouth daily.   07/21/2015 at Unknown time  . megestrol (MEGACE) 20 MG tablet Take 2 tablets (40 mg total) by mouth 2 (two) times daily. (Patient taking differently: Take 40 mg by mouth 4 (four) times daily. ) 160 tablet 3 07/21/2015 at Unknown time    I have reviewed patient's Past Medical Hx, Surgical Hx, Family Hx, Social Hx, medications and allergies.  ROS:  Review of Systems  Constitutional: Negative for fever and chills.  Respiratory: Negative for shortness of breath.   Gastrointestinal: Negative for nausea,  vomiting, diarrhea and constipation.  Genitourinary: Positive for vaginal bleeding, menstrual problem and pelvic pain.   Other systems negative     Physical Exam  Patient Vitals for the past 24 hrs:  BP Temp Temp src Pulse Resp  07/31/15 1807 135/82 mmHg 98.4 F (36.9 C) Oral 90 17   Constitutional: Well-developed, well-nourished female in no acute distress.  Cardiovascular: normal rate and rhythm, no ectopy audible, S1 & S2 heard, no murmur Respiratory: normal effort, no distress. Lungs CTAB with no wheezes or crackles GI: Abd soft, slightly tender.  Nondistended.  No rebound, No guarding.  Bowel Sounds audible  MS: Extremities nontender, no edema, normal ROM Neurologic: Alert and oriented x 4.   Grossly nonfocal. GU: Neg CVAT.   Moderate vaginal bleeding Skin:  Warm and Dry Psych:   Affect appropriate.   Labs: Results for orders placed or performed during the hospital encounter of 07/31/15 (from the past 24 hour(s))  Pregnancy, urine POC     Status: None   Collection Time: 07/31/15  4:15 PM  Result Value Ref Range   Preg Test, Ur NEGATIVE NEGATIVE  CBC     Status: Abnormal   Collection Time: 07/31/15  5:50 PM  Result Value Ref Range   WBC 10.7 (H) 4.0 - 10.5 K/uL   RBC 4.47 3.87 - 5.11 MIL/uL   Hemoglobin 11.3 (L) 12.0 - 15.0 g/dL   HCT 31.2 (L) 36.0 - 46.0 %   MCV 69.8 (L) 78.0 - 100.0 fL   MCH 25.3 (L) 26.0 - 34.0 pg   MCHC 36.2 (H) 30.0 - 36.0 g/dL   RDW 14.8 11.5 - 15.5 %   Platelets 455 (H) 150 - 400 K/uL  Basic metabolic panel     Status: Abnormal   Collection Time: 07/31/15  5:50 PM  Result Value Ref Range   Sodium 129 (L) 135 - 145 mmol/L   Potassium 3.5 3.5 - 5.1 mmol/L   Chloride 97 (L) 101 - 111 mmol/L   CO2 24 22 - 32 mmol/L   Glucose, Bld 101 (H) 65 - 99 mg/dL   BUN 11 6 - 20 mg/dL   Creatinine, Ser 0.54 0.44 - 1.00 mg/dL   Calcium 9.3 8.9 - 10.3 mg/dL   GFR calc non Af Amer >60 >60 mL/min   GFR calc Af Amer >60 >60 mL/min   Anion gap 8 5 - 15    Ref. Range 07/21/2015 18:50  Hemoglobin Latest Ref Range: 12.0-15.0 g/dL 13.1   --/--/AB POS (08/29 1253)  Imaging:  No results found.  MAU Course/MDM: I have ordered labs as follows:  CBC Imaging ordered: none Results reviewed.   Hemoglobin has gone from 13.1 to 11.3  I wrote her a letter for work asking them to allow he rto bring her supplies and that the patient is requesting an easier workload until she can get her condition fully treated.   Advised patient to followup in clinic with Dr Ihor Dow for definitive plan    Pt stable at time of discharge.  Assessment: Menorrhagia with slight decrease in Hemoglobin level  Plan: Discharge home Recommend follow up in clinic  Patient states she has no money left on her credit card to pay for meds. Rx sent for Megace refill for  DUB Added Rx for Ibuprofen for cramping and may help with bleeding also Discussed via interpretor that these two meds can be gotten for the $4 list at [place] percocet for pain  Medication List    ASK your doctor about these medications        acetaminophen 500 MG tablet  Commonly known as:  TYLENOL  Take 1,000 mg by mouth every 6 (six) hours as needed.     diclofenac 75 MG EC tablet  Commonly known as:  VOLTAREN  Take 75 mg by mouth 2 (two) times daily as needed. For pain     diphenhydramine-acetaminophen 25-500 MG Tabs tablet  Commonly known as:  TYLENOL PM  Take 1 tablet by mouth at bedtime.     ferrous sulfate 325 (65 FE) MG tablet  Take 1 tablet (325 mg total) by mouth daily with breakfast.     fexofenadine-pseudoephedrine 60-120 MG 12 hr tablet  Commonly known as:  ALLEGRA-D  Take 1 tablet by mouth 2 (two) times daily.     lisinopril-hydrochlorothiazide 20-25 MG tablet  Commonly known as:  PRINZIDE,ZESTORETIC  Take 1 tablet by mouth daily.     megestrol 20 MG tablet  Commonly known as:  MEGACE  Take 2 tablets (40 mg total) by mouth 2 (two) times daily.       Encouraged to return here or to other Urgent Care/ED if she develops worsening of symptoms, increase in pain, fever, or other concerning symptoms.   Hansel Feinstein CNM, MSN Certified Nurse-Midwife 07/31/2015 7:14 PM

## 2015-07-31 NOTE — MAU Note (Signed)
Urine in lab 

## 2015-08-19 ENCOUNTER — Encounter (HOSPITAL_COMMUNITY): Payer: Self-pay | Admitting: Emergency Medicine

## 2015-08-19 ENCOUNTER — Ambulatory Visit (HOSPITAL_COMMUNITY)
Admission: EM | Admit: 2015-08-19 | Discharge: 2015-08-19 | Disposition: A | Discharge status: Home / Self Care | Payer: Self-pay | Attending: Family Medicine | Admitting: Family Medicine

## 2015-08-19 DIAGNOSIS — G8929 Other chronic pain: Secondary | ICD-10-CM

## 2015-08-19 DIAGNOSIS — Z760 Encounter for issue of repeat prescription: Secondary | ICD-10-CM

## 2015-08-19 MED ORDER — DICLOFENAC SODIUM 75 MG PO TBEC: 75.0000 mg | DELAYED_RELEASE_TABLET | Freq: Two times a day (BID) | ORAL | 2 refills | Status: DC

## 2015-08-19 MED ORDER — LISINOPRIL-HYDROCHLOROTHIAZIDE 20-25 MG PO TABS: 1.0000 | ORAL_TABLET | Freq: Every day | ORAL | 1 refills | Status: DC

## 2015-08-19 NOTE — ED Triage Notes (Signed)
The patient presented to the [facility] with a request to refill two of her medications: lisinopril and diclofenac.

## 2015-08-19 NOTE — ED Provider Notes (Signed)
CSN: [id number]     Arrival date & time 08/19/15  1000 History   First MD Initiated Contact with Patient 08/19/15 1033     Chief Complaint  Patient presents with  . Medication Refill   (Consider location/radiation/quality/duration/timing/severity/associated sxs/prior Treatment) HPI 45 y/o female for medication refill pain in chest and arms 5 years, NEEDS REFILL OF MEDICATIONS. ESTAB AT [place]. AUG 9. NO NEW PAINS OR COMPLAINTS. SAME CHEST AND ARM PAINS FOR 5 YEARS NOW. HAS HAD WORKUP BUT NO CAUSE FOUND FOR DISCOMFORT. SYMPTOMS ARE MADE BETTER WITH DICLOFENAC.  PAIN SCORE CHRONICALLY IS 4. Past Medical History:  Diagnosis Date  . [id number]   . Acne   . Back pain   . Essential hypertension   . Seasonal allergies    Past Surgical History:  Procedure Laterality Date  . NO PAST SURGERIES     History reviewed. No pertinent family history. Social History  Substance Use Topics  . Smoking status: Never Smoker  . Smokeless tobacco: Never Used  . Alcohol use No   OB History    Gravida Para Term Preterm AB Living   3 0 0 0 3 0   SAB TAB Ectopic Multiple Live Births   3 0 0 0       Review of Systems  Denies: HEADACHE, NAUSEA, ABDOMINAL PAIN, CHEST PAIN, CONGESTION, DYSURIA, SHORTNESS OF BREATH  Allergies  Review of patient's allergies indicates no known allergies.  Home Medications   Prior to Admission medications   Medication Sig Start Date End Date Taking? Authorizing Provider  lisinopril-hydrochlorothiazide (PRINZIDE,ZESTORETIC) 20-25 MG per tablet Take 1 tablet by mouth daily.   Yes Historical Provider, MD  acetaminophen (TYLENOL) 500 MG tablet Take 1,000 mg by mouth every 6 (six) hours as needed.    Historical Provider, MD  diphenhydramine-acetaminophen (TYLENOL PM) 25-500 MG TABS tablet Take 1 tablet by mouth at bedtime.    Historical Provider, MD  ferrous sulfate 325 (65 FE) MG tablet Take 1 tablet (325 mg total) by mouth daily with breakfast. 07/21/15    Luvenia Redden, PA-C  fexofenadine-pseudoephedrine (ALLEGRA-D) 60-120 MG 12 hr tablet Take 1 tablet by mouth 2 (two) times daily.    Historical Provider, MD  ibuprofen (ADVIL,MOTRIN) 600 MG tablet Take 1 tablet (600 mg total) by mouth every 6 (six) hours as needed. 07/31/15   Seabron Spates, CNM  megestrol (MEGACE) 40 MG tablet Take 1 tablet (40 mg total) by mouth 4 (four) times daily. 07/31/15   Seabron Spates, CNM  oxyCODONE-acetaminophen (PERCOCET/ROXICET) 5-325 MG tablet Take 1 tablet by mouth every 6 (six) hours as needed for moderate pain. 07/31/15   Seabron Spates, CNM   Meds Ordered and Administered this Visit  Medications - No data to display  BP 158/83 (BP Location: Right Arm)   Pulse 112   Temp 98.8 F (37.1 C) (Oral)   Resp 16   LMP 07/20/2015 (Approximate)   SpO2 100%  No data found.   Physical Exam NURSES NOTES AND VITAL SIGNS REVIEWED. CONSTITUTIONAL: Well developed, well nourished, no acute distress HEENT: normocephalic, atraumatic EYES: Conjunctiva normal NECK:normal ROM, supple, no adenopathy PULMONARY:No respiratory distress, normal effort ABDOMINAL: Soft, ND, NT BS+, No CVAT MUSCULOSKELETAL: Normal ROM of all extremities,  SKIN: warm and dry without rash PSYCHIATRIC: Mood and affect, behavior are normal  Urgent Care Course   Clinical Course    Procedures (including critical care time)  Labs Review Labs Reviewed - No data to display  Imaging Review No results found.   Visual Acuity Review  Right Eye Distance:   Left Eye Distance:   Bilateral Distance:    Right Eye Near:   Left Eye Near:    Bilateral Near:        Diclofenac, lisinopril, PT IS ADVISED NOT TO TAKE DICLOFENAC AND IBUPROFEN AT THE SAME TIME.  FOLLOW UP [place].  MDM  No diagnosis found.  Patient is reassured that there are no issues that require transfer to higher level of care at this time or additional tests. Patient is advised to continue  home symptomatic treatment. Patient is advised that if there are new or worsening symptoms to attend the emergency department, contact primary care provider, or return to UC. Instructions of care provided discharged home in stable condition.    THIS NOTE WAS GENERATED USING A VOICE RECOGNITION SOFTWARE PROGRAM. ALL REASONABLE EFFORTS  WERE MADE TO PROOFREAD THIS DOCUMENT FOR ACCURACY.  I have verbally reviewed the discharge instructions with the patient. A printed AVS was given to the patient.  All questions were answered prior to discharge.      Konrad Felix, [place] 08/19/15 1202

## 2015-08-25 ENCOUNTER — Encounter: Payer: Self-pay | Admitting: Obstetrics & Gynecology

## 2015-08-25 ENCOUNTER — Ambulatory Visit (INDEPENDENT_AMBULATORY_CARE_PROVIDER_SITE_OTHER): Payer: Self-pay | Admitting: Obstetrics & Gynecology

## 2015-08-25 VITALS — BP 145/90 | HR 93 | Wt 145.1 lb

## 2015-08-25 DIAGNOSIS — D259 Leiomyoma of uterus, unspecified: Secondary | ICD-10-CM

## 2015-08-25 DIAGNOSIS — N939 Abnormal uterine and vaginal bleeding, unspecified: Secondary | ICD-10-CM

## 2015-08-25 NOTE — Progress Notes (Signed)
History:  45 y.o. G3P0030 here today for AUB.  Pt was started on Megace qid and reports that the bleeding is 'better'.  But, pt reports bleeding daily. Intermittently heavy vs light.    The following portions of the patient's history were reviewed and updated as appropriate: allergies, current medications, past family history, past medical history, past social history, past surgical history and problem list.  Current Outpatient Prescriptions on File Prior to Visit  Medication Sig Dispense Refill  . acetaminophen (TYLENOL) 500 MG tablet Take 1,000 mg by mouth every 6 (six) hours as needed.    . diclofenac (VOLTAREN) 75 MG EC tablet Take 1 tablet (75 mg total) by mouth 2 (two) times daily. 60 tablet 2  . diphenhydramine-acetaminophen (TYLENOL PM) 25-500 MG TABS tablet Take 1 tablet by mouth at bedtime.    . ferrous sulfate 325 (65 FE) MG tablet Take 1 tablet (325 mg total) by mouth daily with breakfast. 30 tablet 3  . fexofenadine-pseudoephedrine (ALLEGRA-D) 60-120 MG 12 hr tablet Take 1 tablet by mouth 2 (two) times daily.    Marland Kitchen ibuprofen (ADVIL,MOTRIN) 600 MG tablet Take 1 tablet (600 mg total) by mouth every 6 (six) hours as needed. 30 tablet 5  . lisinopril-hydrochlorothiazide (PRINZIDE,ZESTORETIC) 20-25 MG per tablet Take 1 tablet by mouth daily.    Marland Kitchen lisinopril-hydrochlorothiazide (PRINZIDE,ZESTORETIC) 20-25 MG tablet Take 1 tablet by mouth daily. 90 tablet 1  . megestrol (MEGACE) 40 MG tablet Take 1 tablet (40 mg total) by mouth 4 (four) times daily. 120 tablet 5  . oxyCODONE-acetaminophen (PERCOCET/ROXICET) 5-325 MG tablet Take 1 tablet by mouth every 6 (six) hours as needed for moderate pain. 30 tablet 0   No current facility-administered medications on file prior to visit.     Review of Systems:  Pertinent items are noted in HPI.  Objective:  Physical Exam Blood pressure (!) 145/90, pulse 93, weight 145 lb 1.6 oz (65.8 kg), last menstrual period 07/20/2015. Gen: NAD Abd: Soft,  nontender and nondistended Pelvic:not repeated  Labs and Imaging 03/26/2015 Diagnosis Endometrium, biopsy DETACHED SCANT INACTIVE ENDOMETRIUM NEGATIVE FOR MALIGNANCY  03/26/2015 CLINICAL DATA:  Vaginal bleeding.  EXAM: TRANSABDOMINAL AND TRANSVAGINAL ULTRASOUND OF PELVIS  TECHNIQUE: Both transabdominal and transvaginal ultrasound examinations of the pelvis were performed. Transabdominal technique was performed for global imaging of the pelvis including uterus, ovaries, adnexal regions, and pelvic cul-de-sac. It was necessary to proceed with endovaginal exam following the transabdominal exam to visualize the uterus and ovaries.  COMPARISON:  10/05/2014.  FINDINGS: Uterus  Measurements: 12.3 x 6.0 x 13.2 cm. Multiple fibroids are present with the largest measuring 6 cm. Present.  Endometrium  Thickness: 19 mm.  A 3.1 cm endometrial or submucosal mass is noted.  Right ovary  Measurements: 3.4 x 2.2 x 2.3 cm. Normal appearance/no adnexal mass.  Left ovary  Measurements: 3.5 x 2.6 x 3.5 cm. Normal appearance/no adnexal mass.  Other findings  No abnormal free fluid.  IMPRESSION: 1. Multiple fibroids.  2. Endometrial thickening to 19 mm. A 3.1 cm endometrial or submucosal mass is noted.  A focal endometrial lesion issuspected. Consider sonohysterogram forfurther evaluation, prior to hysteroscopy or endometrial biopsy.   Assessment & Plan:  AUB- suspect due to submucosal fibroids.  Elevated BP- pt on Lisinopril.  Being followed by primary care  Labs: TSH, CBC  Needs to complete financial aid paperwork Keep Megace until that time but, at 40mg  bid  Patient desires surgical management with hysteroscopy with dilation curettage and fibroid resection and endometrial ablation.  The risks of surgery were discussed in detail with the patient including but not limited to: bleeding which may require transfusion or reoperation; infection which may require  prolonged hospitalization or re-hospitalization and antibiotic therapy; injury to bowel, bladder, ureters and major vessels or other surrounding organs; need for additional procedures including laparotomy; thromboembolic phenomenon, incisional problems and other postoperative or anesthesia complications.  Patient was told that the likelihood that her condition and symptoms will be treated effectively with this surgical management was very high; the postoperative expectations were also discussed in detail. The patient also understands the alternative treatment options which were discussed in full. All questions were answered.  She was told that she will be contacted by our surgical scheduler regarding the time and date of her surgery; routine preoperative instructions of having nothing to eat or drink after midnight on the day prior to surgery and also coming to the hospital 1 1/2 hours prior to her time of surgery were also emphasized.  She was told she may be called for a preoperative appointment about a week prior to surgery and will be given further preoperative instructions at that visit. Printed patient education handouts about the procedure were given to the patient to review at home.  [place] interpreter [phone] used for entire visit  [place]. Harraway-Smith, M.D., Cherlynn June

## 2015-08-26 NOTE — Progress Notes (Unsigned)
Pt came to front office requesting information to be added to the letter that was given to yesterday and she wants an out of work letter because she missed work today due to pain.  I could not understand pt English well to understand her requests so called [employer] with [employer] # [employer] clarified pt's request that she to have added to letter that instead of her carrying 3-4 cartons she wants to carry 1-2 cartons because when she carries 3-4 she bleeds and that is the reason why she had to go the ER.  Notified Dr.Constant in regards to pt request she stated that pt's request could be added to the letter of carrying less cartons and that she can not receive a out of work excuse letter.  Pt agreed.

## 2015-08-28 ENCOUNTER — Encounter (HOSPITAL_COMMUNITY): Payer: Self-pay | Admitting: *Deleted

## 2015-09-29 NOTE — Patient Instructions (Signed)
Your procedure is scheduled on:  Tuesday, Sept. 26, 2017  Enter through the [employer] of [facility] at:  11:30 AM  Pick up the phone at the desk and dial [phone].  Call this number if you have problems the morning of surgery: [phone].  Remember: Do NOT eat food:  After Midnight Monday, Sept. 25, 2017  Do NOT drink clear liquids after:  9:00 AM day of surgery  Take these medicines the morning of surgery with a SIP OF WATER:  Lisinopril  Do NOT wear jewelry (body piercing), metal hair clips/bobby pins, make-up, or nail polish. Do NOT wear lotions, powders, or perfumes.  You may wear deodorant. Do NOT shave for 48 hours prior to surgery. Do NOT bring valuables to the hospital. Contacts, dentures, or bridgework may not be worn into surgery.  Have a responsible adult drive you home and stay with you for 24 hours after your procedure

## 2015-09-30 ENCOUNTER — Inpatient Hospital Stay (HOSPITAL_COMMUNITY)
Admission: RE | Admit: 2015-09-30 | Discharge: 2015-09-30 | Disposition: A | Discharge status: Home / Self Care | Payer: Self-pay | Source: Ambulatory Visit

## 2015-10-11 ENCOUNTER — Inpatient Hospital Stay (HOSPITAL_COMMUNITY)
Admission: RE | Admit: 2015-10-11 | Discharge: 2015-10-11 | Discharge status: Against Medical Advice | Payer: Self-pay | Source: Ambulatory Visit | Attending: Obstetrics and Gynecology | Admitting: Obstetrics and Gynecology

## 2015-10-11 DIAGNOSIS — Z760 Encounter for issue of repeat prescription: Secondary | ICD-10-CM | POA: Insufficient documentation

## 2015-10-11 DIAGNOSIS — Z5321 Procedure and treatment not carried out due to patient leaving prior to being seen by health care provider: Secondary | ICD-10-CM | POA: Insufficient documentation

## 2015-10-11 NOTE — MAU Note (Signed)
Pt states she wants to go eat and will come back to possibly sign the consent to treat forms.  Pt walked out of the hospital.

## 2015-10-11 NOTE — MAU Note (Signed)
Pt left AMA and did not return.

## 2015-10-11 NOTE — MAU Note (Signed)
Pt is talking via interpreter using both french and english and she wants medication refills but has no money to pay for them.

## 2015-10-14 ENCOUNTER — Encounter (HOSPITAL_COMMUNITY): Admission: RE | Payer: Self-pay | Source: Ambulatory Visit

## 2015-10-14 ENCOUNTER — Ambulatory Visit (HOSPITAL_COMMUNITY): Admission: RE | Admit: 2015-10-14 | Payer: Self-pay | Source: Ambulatory Visit | Admitting: Obstetrics & Gynecology

## 2015-10-14 SURGERY — DILATATION & CURETTAGE/HYSTEROSCOPY WITH NOVASURE ABLATION
Anesthesia: Choice

## 2015-11-14 ENCOUNTER — Ambulatory Visit (HOSPITAL_COMMUNITY)
Admission: EM | Admit: 2015-11-14 | Discharge: 2015-11-14 | Discharge status: Against Medical Advice | Payer: Self-pay | Attending: Internal Medicine | Admitting: Internal Medicine

## 2015-11-14 ENCOUNTER — Encounter (HOSPITAL_COMMUNITY): Payer: Self-pay | Admitting: Emergency Medicine

## 2015-11-14 NOTE — ED Provider Notes (Signed)
LWBS after triage.   Noland Fordyce, PA-C 11/14/15 1945

## 2015-11-14 NOTE — ED Triage Notes (Signed)
PT reports chest pain for five years. PT reports it is worse with exertion. PT reports new onset back and breast pain today. PT denies accompanying symptoms. When initially asked why she is here, PT states, "medication refill." PT is requesting refill of diclofenac and lisinopril. Interpreter [phone] used for triage.

## 2015-11-14 NOTE — ED Notes (Signed)
Pt reported she is leaving because she is hungry. Was offered something to eat and pt stated she still wanted to go.

## 2015-11-17 ENCOUNTER — Ambulatory Visit (HOSPITAL_COMMUNITY)
Admission: EM | Admit: 2015-11-17 | Discharge: 2015-11-17 | Disposition: A | Discharge status: Home / Self Care | Payer: Self-pay | Attending: Emergency Medicine | Admitting: Emergency Medicine

## 2015-11-17 ENCOUNTER — Encounter (HOSPITAL_COMMUNITY): Payer: Self-pay | Admitting: Emergency Medicine

## 2015-11-17 DIAGNOSIS — M159 Polyosteoarthritis, unspecified: Secondary | ICD-10-CM

## 2015-11-17 DIAGNOSIS — M150 Primary generalized (osteo)arthritis: Secondary | ICD-10-CM

## 2015-11-17 MED ORDER — DICLOFENAC SODIUM 75 MG PO TBEC: 75.0000 mg | DELAYED_RELEASE_TABLET | Freq: Two times a day (BID) | ORAL | 2 refills | Status: DC

## 2015-11-17 NOTE — ED Triage Notes (Signed)
The patient presented to the [facility] to request a refill on her Diclofenac. The patient stated that she has not taken any in 2 weeks.

## 2015-11-17 NOTE — ED Provider Notes (Signed)
CSN: [id number]     Arrival date & time 11/17/15  1546 History   First MD Initiated Contact with Patient 11/17/15 1614     Chief Complaint  Patient presents with  . Medication Refill   (Consider location/radiation/quality/duration/timing/severity/associated sxs/prior Treatment) Patient is here for refill on her diclofenac arthritis medicine.   The history is provided by the patient.  Medication Refill  Medications/supplies requested:  Diclofenac Reason for request:  Clinic/provider not available Medications taken before: yes - see home medications   Patient has complete original prescription information: yes     Past Medical History:  Diagnosis Date  . [id number]   . Acne   . Back pain   . Essential hypertension   . Seasonal allergies    Past Surgical History:  Procedure Laterality Date  . NO PAST SURGERIES     History reviewed. No pertinent family history. Social History  Substance Use Topics  . Smoking status: Never Smoker  . Smokeless tobacco: Never Used  . Alcohol use No   OB History    Gravida Para Term Preterm AB Living   3 0 0 0 3 0   SAB TAB Ectopic Multiple Live Births   3 0 0 0       Review of Systems  Constitutional: Negative.   HENT: Negative.   Eyes: Negative.   Respiratory: Negative.   Cardiovascular: Negative.   Gastrointestinal: Negative.   Endocrine: Negative.   Genitourinary: Negative.   Musculoskeletal: Positive for arthralgias.  Skin: Negative.   Allergic/Immunologic: Negative.   Neurological: Negative.   Hematological: Negative.   Psychiatric/Behavioral: Negative.     Allergies  Review of patient's allergies indicates no known allergies.  Home Medications   Prior to Admission medications   Medication Sig Start Date End Date Taking? Authorizing Provider  diclofenac (VOLTAREN) 75 MG EC tablet Take 1 tablet (75 mg total) by mouth 2 (two) times daily. 08/19/15  Yes Konrad Felix, PA  acetaminophen (TYLENOL) 500 MG tablet Take 1,000  mg by mouth every 6 (six) hours as needed.    Historical Provider, MD  diphenhydramine-acetaminophen (TYLENOL PM) 25-500 MG TABS tablet Take 1 tablet by mouth at bedtime.    Historical Provider, MD  ferrous sulfate 325 (65 FE) MG tablet Take 1 tablet (325 mg total) by mouth daily with breakfast. 07/21/15   Luvenia Redden, PA-C  fexofenadine-pseudoephedrine (ALLEGRA-D) 60-120 MG 12 hr tablet Take 1 tablet by mouth 2 (two) times daily.    Historical Provider, MD  ibuprofen (ADVIL,MOTRIN) 600 MG tablet Take 1 tablet (600 mg total) by mouth every 6 (six) hours as needed. 07/31/15   Seabron Spates, CNM  lisinopril-hydrochlorothiazide (PRINZIDE,ZESTORETIC) 20-25 MG per tablet Take 1 tablet by mouth daily.    Historical Provider, MD  lisinopril-hydrochlorothiazide (PRINZIDE,ZESTORETIC) 20-25 MG tablet Take 1 tablet by mouth daily. 08/19/15   Konrad Felix, PA  megestrol (MEGACE) 40 MG tablet Take 1 tablet (40 mg total) by mouth 4 (four) times daily. 07/31/15   Seabron Spates, CNM  oxyCODONE-acetaminophen (PERCOCET/ROXICET) 5-325 MG tablet Take 1 tablet by mouth every 6 (six) hours as needed for moderate pain. 07/31/15   Seabron Spates, CNM   Meds Ordered and Administered this Visit  Medications - No data to display  BP 166/91 (BP Location: Left Arm)   Pulse 119   Temp 99.5 F (37.5 C) (Oral)   Resp 12   SpO2 100%  No data found.   Physical Exam  Constitutional: She is  oriented to person, place, and time. She appears well-developed and well-nourished.  HENT:  Head: Normocephalic and atraumatic.  Eyes: Conjunctivae and EOM are normal. Pupils are equal, round, and reactive to light.  Neck: Normal range of motion. Neck supple.  Cardiovascular: Normal rate, regular rhythm and normal heart sounds.   Pulmonary/Chest: Effort normal and breath sounds normal.  Abdominal: Soft. Bowel sounds are normal.  Neurological: She is alert and oriented to person, place, and time.  Nursing note and vitals  reviewed.   Urgent Care Course   Clinical Course    Procedures (including critical care time)  Labs Review Labs Reviewed - No data to display  Imaging Review No results found.   Visual Acuity Review  Right Eye Distance:   Left Eye Distance:   Bilateral Distance:    Right Eye Near:   Left Eye Near:    Bilateral Near:         MDM  Osteoarthriits - Diclofenac 75mg  one po bid #60 w/2 rf    Lysbeth Penner, FNP 11/17/15 [phone]

## 2015-12-21 ENCOUNTER — Ambulatory Visit (HOSPITAL_COMMUNITY)
Admission: EM | Admit: 2015-12-21 | Discharge: 2015-12-21 | Disposition: A | Discharge status: Home / Self Care | Payer: Self-pay | Attending: Emergency Medicine | Admitting: Emergency Medicine

## 2015-12-21 ENCOUNTER — Other Ambulatory Visit: Payer: Self-pay | Admitting: Advanced Practice Midwife

## 2015-12-21 ENCOUNTER — Encounter (HOSPITAL_COMMUNITY): Payer: Self-pay | Admitting: *Deleted

## 2015-12-21 DIAGNOSIS — Z760 Encounter for issue of repeat prescription: Secondary | ICD-10-CM

## 2015-12-21 DIAGNOSIS — N938 Other specified abnormal uterine and vaginal bleeding: Secondary | ICD-10-CM

## 2015-12-21 MED ORDER — MEGESTROL ACETATE 40 MG PO TABS: 40.0000 mg | ORAL_TABLET | Freq: Four times a day (QID) | ORAL | 2 refills | Status: DC

## 2015-12-21 NOTE — ED Triage Notes (Signed)
Pt requesting refill for megestrol 40mg  tabs.  No PCP.

## 2015-12-21 NOTE — ED Triage Notes (Signed)
Pt with flat affect and poor eye contact.

## 2015-12-21 NOTE — ED Provider Notes (Signed)
CSN: [id number]     Arrival date & time 12/21/15  1218 History   First MD Initiated Contact with Patient 12/21/15 1424     Chief Complaint  Patient presents with  . Medication Refill   (Consider location/radiation/quality/duration/timing/severity/associated sxs/prior Treatment) 45 year old female presents for refill on her Megestrol. Has been taking this medication for irregular bleeding and pain. Has been prescribed this medication up to 4 times a day but will often take it 6 to 7 times a day for her to "function".  Has long standing history of dysfunction uterine bleeding. Has had ultrasound which showed multiple fibroids. Also had endometrial biopsy which was negative in March 2017. Bleeding has been better on Megestrol but still continues to bleed daily. She was scheduled for a hysteroscopy and endometrial ablation in Sept 2017 but patient canceled. She continues to have chronic pain in which she takes Diclofenac daily. She has not followed up with her GYN since Sept regarding her irregular bleeding. She also has a history of schizophrenia and has flight of ideas and is a poor historian today. Uncertain if she is taking other medication for BP. She currently has no PCP.     Medication Refill    Past Medical History:  Diagnosis Date  . [id number]   . Acne   . Back pain   . Essential hypertension   . Seasonal allergies    Past Surgical History:  Procedure Laterality Date  . NO PAST SURGERIES     No family history on file. Social History  Substance Use Topics  . Smoking status: Never Smoker  . Smokeless tobacco: Never Used  . Alcohol use No   OB History    Gravida Para Term Preterm AB Living   3 0 0 0 3 0   SAB TAB Ectopic Multiple Live Births   3 0 0 0       Review of Systems  Constitutional: Negative for chills, fatigue and fever.  Gastrointestinal: Negative for blood in stool, diarrhea, nausea and vomiting.  Genitourinary: Positive for vaginal bleeding. Negative for  difficulty urinating, dysuria, frequency and hematuria.  Musculoskeletal: Positive for arthralgias and myalgias.  Neurological: Negative for dizziness and weakness.  Psychiatric/Behavioral: Positive for agitation, behavioral problems, decreased concentration and sleep disturbance. The patient is nervous/anxious.     Allergies  Patient has no known allergies.  Home Medications   Prior to Admission medications   Medication Sig Start Date End Date Taking? Authorizing Provider  acetaminophen (TYLENOL) 500 MG tablet Take 1,000 mg by mouth every 6 (six) hours as needed.   Yes Historical Provider, MD  diclofenac (VOLTAREN) 75 MG EC tablet Take 1 tablet (75 mg total) by mouth 2 (two) times daily. 11/17/15  Yes Lysbeth Penner, FNP  diphenhydramine-acetaminophen (TYLENOL PM) 25-500 MG TABS tablet Take 1 tablet by mouth at bedtime.   Yes Historical Provider, MD  lisinopril-hydrochlorothiazide (PRINZIDE,ZESTORETIC) 20-25 MG per tablet Take 1 tablet by mouth daily.   Yes Historical Provider, MD  ferrous sulfate 325 (65 FE) MG tablet Take 1 tablet (325 mg total) by mouth daily with breakfast. 07/21/15   Luvenia Redden, PA-C  megestrol (MEGACE) 40 MG tablet Take 1 tablet (40 mg total) by mouth 4 (four) times daily. 12/21/15   Katy Apo, NP   Meds Ordered and Administered this Visit  Medications - No data to display  BP 163/76   Pulse 93   Temp 98.4 F (36.9 C) (Oral)   Resp 16  LMP 07/20/2015 (Approximate)   SpO2 100%  No data found.   Physical Exam  Constitutional: She appears well-developed and well-nourished.  Patient has multiple papers and credit cards laid out on floor of exam room. When entering room and discussing reason for visit she left and went to the bathroom. When she returned she had poor eye contact and had difficulty answering questions.   HENT:  Head: Normocephalic and atraumatic.  Cardiovascular: Normal rate, regular rhythm and normal heart sounds.   Pulmonary/Chest:  Effort normal and breath sounds normal.  Abdominal: Soft. Bowel sounds are normal. There is generalized tenderness.  Neurological: She is alert.  Skin: Skin is warm and dry.  Psychiatric: Her affect is labile. Her speech is delayed. She is aggressive and slowed. She exhibits a depressed mood.  Had flight of ideas and had difficulty recalling medication she is taking.    Urgent Care Course   Clinical Course     Procedures (including critical care time)  Labs Review Labs Reviewed - No data to display  Imaging Review No results found.   Visual Acuity Review  Right Eye Distance:   Left Eye Distance:   Bilateral Distance:    Right Eye Near:   Left Eye Near:    Bilateral Near:         MDM   1. Encounter for medication refill   2. Dysfunctional uterine bleeding    Discussed that we will refill this medication one time but she needs to return to her GYN for further evaluation and refills. Prescribed Megestrol 40mg  4 times a day as directed- discussed taking as prescribed and not increasing dosing without discussing first with provider. Offered to refill other medication but patient said she had sufficient amount (refill provided here in Nov 17, 2015 for Diclofenac for 3 months). However, according to our records she may run out of her BP meds today (was given 90 day supply of Lisinopril HCTZ on 08/19/15). Patient declined new RX, even though BP is elevated today. Discussed following up with a PCP for better chronic health management as well as her GYN to review again alternatives to medication for abnormal bleeding.      Katy Apo, NP 12/22/15 1114

## 2015-12-21 NOTE — Discharge Instructions (Signed)
You need to see your GYN again for additional refills of this medication.

## 2015-12-24 ENCOUNTER — Emergency Department (HOSPITAL_COMMUNITY)
Admission: EM | Admit: 2015-12-24 | Discharge: 2015-12-25 | Disposition: A | Discharge status: Home / Self Care | Payer: Self-pay

## 2015-12-24 DIAGNOSIS — Z79899 Other long term (current) drug therapy: Secondary | ICD-10-CM | POA: Insufficient documentation

## 2015-12-24 DIAGNOSIS — I10 Essential (primary) hypertension: Secondary | ICD-10-CM | POA: Insufficient documentation

## 2015-12-24 DIAGNOSIS — F259 Schizoaffective disorder, unspecified: Secondary | ICD-10-CM | POA: Diagnosis present

## 2015-12-24 DIAGNOSIS — Z046 Encounter for general psychiatric examination, requested by authority: Secondary | ICD-10-CM

## 2015-12-24 DIAGNOSIS — F250 Schizoaffective disorder, bipolar type: Secondary | ICD-10-CM | POA: Insufficient documentation

## 2015-12-25 ENCOUNTER — Encounter (HOSPITAL_COMMUNITY): Payer: Self-pay | Admitting: Emergency Medicine

## 2015-12-25 DIAGNOSIS — Z79899 Other long term (current) drug therapy: Secondary | ICD-10-CM

## 2015-12-25 DIAGNOSIS — F250 Schizoaffective disorder, bipolar type: Secondary | ICD-10-CM

## 2015-12-25 LAB — RAPID URINE DRUG SCREEN, HOSP PERFORMED
AMPHETAMINES: NOT DETECTED
BARBITURATES: NOT DETECTED
BENZODIAZEPINES: NOT DETECTED
COCAINE: NOT DETECTED
OPIATES: NOT DETECTED
TETRAHYDROCANNABINOL: NOT DETECTED

## 2015-12-25 LAB — CBC WITH DIFFERENTIAL/PLATELET
BASOS ABS: 0 10*3/uL (ref 0.0–0.1)
Basophils Relative: 0 %
EOS PCT: 1 %
Eosinophils Absolute: 0.1 10*3/uL (ref 0.0–0.7)
HCT: 34.6 % — ABNORMAL LOW (ref 36.0–46.0)
Hemoglobin: 11.8 g/dL — ABNORMAL LOW (ref 12.0–15.0)
Lymphocytes Relative: 22 %
Lymphs Abs: 2.1 10*3/uL (ref 0.7–4.0)
MCH: 21.7 pg — ABNORMAL LOW (ref 26.0–34.0)
MCHC: 34.1 g/dL (ref 30.0–36.0)
MCV: 63.6 fL — AB (ref 78.0–100.0)
MONO ABS: 0.8 10*3/uL (ref 0.1–1.0)
MONOS PCT: 8 %
NEUTROS PCT: 69 %
Neutro Abs: 6.5 10*3/uL (ref 1.7–7.7)
PLATELETS: 354 10*3/uL (ref 150–400)
RBC: 5.44 MIL/uL — AB (ref 3.87–5.11)
RDW: 20.4 % — ABNORMAL HIGH (ref 11.5–15.5)
WBC: 9.5 10*3/uL (ref 4.0–10.5)

## 2015-12-25 LAB — COMPREHENSIVE METABOLIC PANEL
ALBUMIN: 4.5 g/dL (ref 3.5–5.0)
ALT: 28 U/L (ref 14–54)
AST: 24 U/L (ref 15–41)
Alkaline Phosphatase: 53 U/L (ref 38–126)
Anion gap: 12 (ref 5–15)
BUN: 16 mg/dL (ref 6–20)
CHLORIDE: 100 mmol/L — AB (ref 101–111)
CO2: 24 mmol/L (ref 22–32)
CREATININE: 0.75 mg/dL (ref 0.44–1.00)
Calcium: 9.3 mg/dL (ref 8.9–10.3)
GFR calc Af Amer: >60 mL/min (ref >60)
GLUCOSE: 106 mg/dL — AB (ref 65–99)
POTASSIUM: 3.2 mmol/L — AB (ref 3.5–5.1)
SODIUM: 136 mmol/L (ref 135–145)
Total Bilirubin: 0.7 mg/dL (ref 0.3–1.2)
Total Protein: 7.7 g/dL (ref 6.5–8.1)

## 2015-12-25 LAB — ETHANOL: Alcohol, Ethyl (B): <5 mg/dL (ref <5)

## 2015-12-25 LAB — SALICYLATE LEVEL: Salicylate Lvl: <7 mg/dL (ref 2.8–30.0)

## 2015-12-25 LAB — ACETAMINOPHEN LEVEL: Acetaminophen (Tylenol), Serum: <10 ug/mL — ABNORMAL LOW (ref 10–30)

## 2015-12-25 MED ORDER — DICLOFENAC SODIUM 75 MG PO TBEC
75.0000 mg | DELAYED_RELEASE_TABLET | Freq: Two times a day (BID) | ORAL | Status: DC
  Administered 2015-12-25: 75 mg via ORAL
  Filled 2015-12-25: qty 1

## 2015-12-25 MED ORDER — LISINOPRIL 20 MG PO TABS
20.0000 mg | ORAL_TABLET | Freq: Every day | ORAL | Status: DC
  Administered 2015-12-25: 20 mg via ORAL
  Filled 2015-12-25: qty 1

## 2015-12-25 MED ORDER — POTASSIUM CHLORIDE CRYS ER 20 MEQ PO TBCR
40.0000 meq | EXTENDED_RELEASE_TABLET | Freq: Once | ORAL | Status: AC
  Administered 2015-12-25: 40 meq via ORAL
  Filled 2015-12-25: qty 2

## 2015-12-25 MED ORDER — ACETAMINOPHEN 500 MG PO TABS: 1000.0000 mg | ORAL_TABLET | Freq: Four times a day (QID) | ORAL | Status: DC | PRN

## 2015-12-25 MED ORDER — LISINOPRIL-HYDROCHLOROTHIAZIDE 20-25 MG PO TABS: 1.0000 | ORAL_TABLET | Freq: Every day | ORAL | Status: DC

## 2015-12-25 MED ORDER — HYDROCHLOROTHIAZIDE 25 MG PO TABS
25.0000 mg | ORAL_TABLET | Freq: Every day | ORAL | Status: DC
  Administered 2015-12-25: 25 mg via ORAL
  Filled 2015-12-25: qty 1

## 2015-12-25 MED ORDER — LORATADINE 10 MG PO TABS
10.0000 mg | ORAL_TABLET | Freq: Every day | ORAL | Status: DC
  Administered 2015-12-25: 10 mg via ORAL
  Filled 2015-12-25: qty 1

## 2015-12-25 NOTE — ED Notes (Signed)
Patient states she has not eaten since being at [facility] because none of the food was appropriate to her diet. Patient offered food stocked in ED, the only thing she states she can eat is bread and saltine crackers. Patient given several packs of crackers and water.

## 2015-12-25 NOTE — ED Notes (Addendum)
Pt oriented to room and unit.Skin assessment done and no contraband was found.  Pt is pleasant and cooperative.  She states she is here because she is not eating but states when home she cooks for herself.  15 minute checks and video monitoring in place.

## 2015-12-25 NOTE — ED Notes (Signed)
Patient is more fluent in [place] than [place]. 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

## 2015-12-25 NOTE — ED Notes (Signed)
Pt discharged ambulatory with her brother.  Discharge instructions were given with referral to follow up with [facility].  Pt had no belongings to return.

## 2015-12-25 NOTE — ED Notes (Signed)
Patient continues to walk to nursing station and stating that she is hungry. Staff has offered pt every food option we have at this hour. Patient states she is unable to eat any of available foods. Patients specific diet is unclear at this time.

## 2015-12-25 NOTE — Consult Note (Signed)
[facility] Consult   Reason for Consult:  Noncompliance with treatment Referring Physician:  EDP Patient Identification: Cindy Garrison MRN:  [id number] Principal Diagnosis: Schizoaffective disorder ([place]) bipolar type Diagnosis:   Patient Active Problem List   Diagnosis Date Noted  . Schizoaffective disorder ([place]) [F25.9] 09/16/2014    Priority: High  . Fibroid uterus [D25.9] 03/26/2015  . DUB (dysfunctional uterine bleeding) [N93.8] 03/26/2015  . Fibroid, uterine [D25.9] 11/22/2014  . Iron deficiency anemia due to chronic blood loss [D50.0] 11/22/2014  . Absolute anemia [D64.9]   . Syncope [R55] 09/16/2014  . Anemia [D64.9] 09/16/2014  . Menorrhagia [N92.0] 09/16/2014  . Syncope and collapse [R55] 09/16/2014  . Back pain [M54.9]   . Essential hypertension [I10]   . ALLERGIC RHINITIS [J30.9] 09/13/2006    Total Time spent with patient: 45 minutes  Subjective:   Cindy Garrison is a 45 y.o. female patient does not warrant admission.  HPI:  45 yo female who presented to the ED from [facility] because she got upset and "threw herself on the floor".  She reports she fell and they sent her here.  She had wanted her medications refilled but they did not understand her and she got frustrated. Berenice does go to [facility] for her mental health care.  Calm and cooperative today, logical thought processes.  Denies suicidal/homicidal ideations, hallucinations, and alcohol/drug abuse.  She would like to leave and retrieve her car from [place] so she can return to work.  Lives with a roommate.  Stable for discharge.  Past Psychiatric History: schizoaffective disorder  Risk to Self: Is patient at risk for suicide?: No Risk to Others:  No Prior Inpatient Therapy:  No Prior Outpatient Therapy:  [facility]  Past Medical History:  Past Medical History:  Diagnosis Date  . #[id number]   . Acne   . Back pain   . Essential hypertension   . Seasonal allergies     Past Surgical  History:  Procedure Laterality Date  . NO PAST SURGERIES     Family History: History reviewed. No pertinent family history. Family Psychiatric  History: none Social History:  History  Alcohol Use No     History  Drug Use No    Social History   Social History  . Marital status: Single    Spouse name: N/A  . Number of children: N/A  . Years of education: N/A   Social History Main Topics  . Smoking status: Never Smoker  . Smokeless tobacco: Never Used  . Alcohol use No  . Drug use: No  . Sexual activity: Not Asked     Comment: monogamous female partner for 5 years (2010)   Other Topics Concern  . None   Social History Narrative  . None   Additional Social History:    Allergies:  No Known Allergies  Labs:  Results for orders placed or performed during the hospital encounter of 12/24/15 (from the past 48 hour(s))  Rapid urine drug screen (hospital performed)     Status: None   Collection Time: 12/25/15  1:25 AM  Result Value Ref Range   Opiates NONE DETECTED NONE DETECTED   Cocaine NONE DETECTED NONE DETECTED   Benzodiazepines NONE DETECTED NONE DETECTED   Amphetamines NONE DETECTED NONE DETECTED   Tetrahydrocannabinol NONE DETECTED NONE DETECTED   Barbiturates NONE DETECTED NONE DETECTED    Comment:        DRUG SCREEN FOR MEDICAL PURPOSES ONLY.  IF CONFIRMATION IS NEEDED FOR ANY PURPOSE,  NOTIFY LAB WITHIN 5 DAYS.        LOWEST DETECTABLE LIMITS FOR URINE DRUG SCREEN Drug Class       Cutoff (ng/mL) Amphetamine      1000 Barbiturate      200 Benzodiazepine   127 Tricyclics       517 Opiates          300 Cocaine          300 THC              50   CBC with Differential     Status: Abnormal   Collection Time: 12/25/15  1:42 AM  Result Value Ref Range   WBC 9.5 4.0 - 10.5 K/uL   RBC 5.44 (H) 3.87 - 5.11 MIL/uL   Hemoglobin 11.8 (L) 12.0 - 15.0 g/dL   HCT 34.6 (L) 36.0 - 46.0 %   MCV 63.6 (L) 78.0 - 100.0 fL   MCH 21.7 (L) 26.0 - 34.0 pg   MCHC 34.1  30.0 - 36.0 g/dL   RDW 20.4 (H) 11.5 - 15.5 %   Platelets 354 150 - 400 K/uL   Neutrophils Relative % 69 %   Lymphocytes Relative 22 %   Monocytes Relative 8 %   Eosinophils Relative 1 %   Basophils Relative 0 %   Neutro Abs 6.5 1.7 - 7.7 K/uL   Lymphs Abs 2.1 0.7 - 4.0 K/uL   Monocytes Absolute 0.8 0.1 - 1.0 K/uL   Eosinophils Absolute 0.1 0.0 - 0.7 K/uL   Basophils Absolute 0.0 0.0 - 0.1 K/uL   RBC Morphology TARGET CELLS   Comprehensive metabolic panel     Status: Abnormal   Collection Time: 12/25/15  1:42 AM  Result Value Ref Range   Sodium 136 135 - 145 mmol/L   Potassium 3.2 (L) 3.5 - 5.1 mmol/L   Chloride 100 (L) 101 - 111 mmol/L   CO2 24 22 - 32 mmol/L   Glucose, Bld 106 (H) 65 - 99 mg/dL   BUN 16 6 - 20 mg/dL   Creatinine, Ser 0.75 0.44 - 1.00 mg/dL   Calcium 9.3 8.9 - 10.3 mg/dL   Total Protein 7.7 6.5 - 8.1 g/dL   Albumin 4.5 3.5 - 5.0 g/dL   AST 24 15 - 41 U/L   ALT 28 14 - 54 U/L   Alkaline Phosphatase 53 38 - 126 U/L   Total Bilirubin 0.7 0.3 - 1.2 mg/dL   GFR calc non Af Amer >60 >60 mL/min   GFR calc Af Amer >60 >60 mL/min    Comment: (NOTE) The eGFR has been calculated using the CKD EPI equation. This calculation has not been validated in all clinical situations. eGFR's persistently <60 mL/min signify possible Chronic Kidney Disease.    Anion gap 12 5 - 15  Acetaminophen level     Status: Abnormal   Collection Time: 12/25/15  1:42 AM  Result Value Ref Range   Acetaminophen (Tylenol), Serum <10 (L) 10 - 30 ug/mL    Comment:        THERAPEUTIC CONCENTRATIONS VARY SIGNIFICANTLY. A RANGE OF 10-30 ug/mL MAY BE AN EFFECTIVE CONCENTRATION FOR MANY PATIENTS. HOWEVER, SOME ARE BEST TREATED AT CONCENTRATIONS OUTSIDE THIS RANGE. ACETAMINOPHEN CONCENTRATIONS >150 ug/mL AT 4 HOURS AFTER INGESTION AND >50 ug/mL AT 12 HOURS AFTER INGESTION ARE OFTEN ASSOCIATED WITH TOXIC REACTIONS.   Salicylate level     Status: None   Collection Time: 12/25/15  1:42 AM   Result Value Ref  Range   Salicylate Lvl <5.1 2.8 - 30.0 mg/dL  Ethanol     Status: None   Collection Time: 12/25/15  1:42 AM  Result Value Ref Range   Alcohol, Ethyl (B) <5 <5 mg/dL    Comment:        LOWEST DETECTABLE LIMIT FOR SERUM ALCOHOL IS 5 mg/dL FOR MEDICAL PURPOSES ONLY     Current Facility-Administered Medications  Medication Dose Route Frequency Provider Last Rate Last Dose  . acetaminophen (TYLENOL) tablet 1,000 mg  1,000 mg Oral Q6H PRN Antonietta Breach, PA-C      . diclofenac (VOLTAREN) EC tablet 75 mg  75 mg Oral BID Antonietta Breach, PA-C   75 mg at 12/25/15 1045  . hydrochlorothiazide (HYDRODIURIL) tablet 25 mg  25 mg Oral Daily Antonietta Breach, PA-C   25 mg at 12/25/15 1045  . lisinopril (PRINIVIL,ZESTRIL) tablet 20 mg  20 mg Oral Daily Antonietta Breach, PA-C   20 mg at 12/25/15 1045  . loratadine (CLARITIN) tablet 10 mg  10 mg Oral Daily Antonietta Breach, PA-C   10 mg at 12/25/15 1046   Current Outpatient Prescriptions  Medication Sig Dispense Refill  . acetaminophen (TYLENOL) 500 MG tablet Take 1,000 mg by mouth every 6 (six) hours as needed.    . diclofenac (VOLTAREN) 75 MG EC tablet Take 1 tablet (75 mg total) by mouth 2 (two) times daily. 60 tablet 2  . fexofenadine (ALLEGRA) 180 MG tablet Take 180 mg by mouth daily.    Marland Kitchen lisinopril-hydrochlorothiazide (PRINZIDE,ZESTORETIC) 20-25 MG per tablet Take 1 tablet by mouth daily.    . megestrol (MEGACE) 40 MG tablet Take 1 tablet (40 mg total) by mouth 4 (four) times daily. 120 tablet 2  . ferrous sulfate 325 (65 FE) MG tablet Take 1 tablet (325 mg total) by mouth daily with breakfast. (Patient not taking: Reported on 12/25/2015) 30 tablet 3    Musculoskeletal: Strength & Muscle Tone: within normal limits Gait & Station: normal Patient leans: N/A  Psychiatric Specialty Exam: Physical Exam  Constitutional: She is oriented to person, place, and time. She appears well-developed and well-nourished.  HENT:  Head: Normocephalic.  Neck:  Normal range of motion.  Respiratory: Effort normal.  Musculoskeletal: Normal range of motion.  Neurological: She is alert and oriented to person, place, and time.  Skin: Skin is warm and dry.  Psychiatric: She has a normal mood and affect. Her speech is normal and behavior is normal. Judgment and thought content normal. Cognition and memory are normal.    Review of Systems  Psychiatric/Behavioral: Negative.   All other systems reviewed and are negative.   Blood pressure 140/83, pulse 87, temperature 98.3 F (36.8 C), temperature source Oral, resp. rate 18, last menstrual period 07/20/2015, SpO2 100 %.There is no height or weight on file to calculate BMI.  General Appearance: Casual  Eye Contact:  Good  Speech:  Normal Rate  Volume:  Normal  Mood:  Euthymic  Affect:  Congruent  Thought Process:  Coherent and Descriptions of Associations: Intact  Orientation:  Full (Time, Place, and Person)  Thought Content:  WDL  Suicidal Thoughts:  No  Homicidal Thoughts:  No  Memory:  Immediate;   Good Recent;   Good Remote;   Good  Judgement:  Fair  Insight:  Fair  Psychomotor Activity:  Normal  Concentration:  Concentration: Good and Attention Span: Good  Recall:  Good  Fund of Knowledge:  Good  Language:  Good  Akathisia:  No  Handed:  Right  AIMS (if indicated):     Assets:  Housing Leisure Time Physical Health Resilience Social Support  ADL's:  Intact  Cognition:  WNL  Sleep:        Treatment Plan Summary: Daily contact with patient to assess and evaluate symptoms and progress in treatment, Medication management and Plan schizoaffective disorder, bipolar type:  -Crisis stabilization -Medication management:  Medical medications restarted -Individual counseling  Disposition: No evidence of imminent risk to self or others at present.    Waylan Boga, NP 12/25/2015 11:19 AM  Patient seen face-to-face for psychiatric evaluation, chart reviewed and case discussed with the  physician extender and developed treatment plan. Reviewed the information documented and agree with the treatment plan. Corena Pilgrim, MD

## 2015-12-25 NOTE — Discharge Instructions (Signed)
For your ongoing mental health needs, you are advised to follow up with [facility].  If you do not have an appointment, new and returning patients are seen at their walk-in clinic.  Walk-in hours are Monday - Friday from 8:00 am - 3:00 pm.  Walk-in patients are seen on a first come, first served basis.  Try to arrive as early as possible for he best chance of being seen the same day:       [facility]      [address]      [place], [place] [address]      [phone]

## 2015-12-25 NOTE — ED Triage Notes (Signed)
Patient BIB [facility], escorted by [employer] with IVC papers from [facility]. EMS reports patient was uncooperative at facility, throwing herself on the floor, refusing to eat, refusing medications and c/o weakness.

## 2015-12-25 NOTE — ED Notes (Addendum)
Patient exhibited purposeful slow motion movement to the floor after being told she was unable to leave the hospital. No injury noted, patient did not hit her head nor have any LOC. Witnessed by this RN and Dominica Severin, NT

## 2015-12-25 NOTE — ED Notes (Signed)
Patient appears drowsy but some what restless, moving form bed to chair and back. Patient is now sitting on side of bed with blanked wrapped around her and over her head.

## 2015-12-25 NOTE — BHH Suicide Risk Assessment (Signed)
Suicide Risk Assessment  Discharge Assessment   [facility] Discharge Suicide Risk Assessment   Principal Problem: Schizoaffective disorder [facility]) Discharge Diagnoses:  Patient Active Problem List   Diagnosis Date Noted  . Schizoaffective disorder ([place]) [F25.9] 09/16/2014    Priority: High  . Fibroid uterus [D25.9] 03/26/2015  . DUB (dysfunctional uterine bleeding) [N93.8] 03/26/2015  . Fibroid, uterine [D25.9] 11/22/2014  . Iron deficiency anemia due to chronic blood loss [D50.0] 11/22/2014  . Absolute anemia [D64.9]   . Syncope [R55] 09/16/2014  . Anemia [D64.9] 09/16/2014  . Menorrhagia [N92.0] 09/16/2014  . Syncope and collapse [R55] 09/16/2014  . Back pain [M54.9]   . Essential hypertension [I10]   . ALLERGIC RHINITIS [J30.9] 09/13/2006    Total Time spent with patient: 45 minutes  Musculoskeletal: Strength & Muscle Tone: within normal limits Gait & Station: normal Patient leans: N/A  Psychiatric Specialty Exam: Physical Exam  Constitutional: She is oriented to person, place, and time. She appears well-developed and well-nourished.  HENT:  Head: Normocephalic.  Neck: Normal range of motion.  Respiratory: Effort normal.  Musculoskeletal: Normal range of motion.  Neurological: She is alert and oriented to person, place, and time.  Skin: Skin is warm and dry.  Psychiatric: She has a normal mood and affect. Her speech is normal and behavior is normal. Judgment and thought content normal. Cognition and memory are normal.    Review of Systems  Psychiatric/Behavioral: Negative.   All other systems reviewed and are negative.   Blood pressure 140/83, pulse 87, temperature 98.3 F (36.8 C), temperature source Oral, resp. rate 18, last menstrual period 07/20/2015, SpO2 100 %.There is no height or weight on file to calculate BMI.  General Appearance: Casual  Eye Contact:  Good  Speech:  Normal Rate  Volume:  Normal  Mood:  Euthymic  Affect:  Congruent  Thought Process:   Coherent and Descriptions of Associations: Intact  Orientation:  Full (Time, Place, and Person)  Thought Content:  WDL  Suicidal Thoughts:  No  Homicidal Thoughts:  No  Memory:  Immediate;   Good Recent;   Good Remote;   Good  Judgement:  Fair  Insight:  Fair  Psychomotor Activity:  Normal  Concentration:  Concentration: Good and Attention Span: Good  Recall:  Good  Fund of Knowledge:  Good  Language:  Good  Akathisia:  No  Handed:  Right  AIMS (if indicated):     Assets:  Housing Leisure Time Physical Health Resilience Social Support  ADL's:  Intact  Cognition:  WNL  Sleep:      Mental Status Per Nursing Assessment::   On Admission:   noncompliance with treatment at [facility]  Demographic Factors:  NA  Loss Factors: NA  Historical Factors: NA  Risk Reduction Factors:   Sense of responsibility to family, Employed, Living with another person, especially a relative, Positive social support and Positive therapeutic relationship  Continued Clinical Symptoms:  None  Cognitive Features That Contribute To Risk:  None    Suicide Risk:  Minimal: No identifiable suicidal ideation.  Patients presenting with no risk factors but with morbid ruminations; may be classified as minimal risk based on the severity of the depressive symptoms    Plan Of Care/Follow-up recommendations:  Activity:  as tolerated Diet:  heart healthy diet  LORD, JAMISON, NP 12/25/2015, 11:28 AM

## 2015-12-25 NOTE — ED Provider Notes (Signed)
[facility] DEPT Provider Note   CSN: [id number] Arrival date & time: 12/24/15  2356  By signing my name below, I, Julien Nordmann, attest that this documentation has been prepared under the direction and in the presence of [employer], PA-C.  Electronically Signed: Julien Nordmann, ED Scribe. 12/25/15. 1:03 AM.    History   Chief Complaint Chief Complaint  Patient presents with  . Psychiatric Evaluation   The history is provided by the patient. A language interpreter was used.   HPI Comments: Cindy Garrison is a 45 y.o. female brought in by ambulance, who has a PMhx of schizoaffective disorder presents to the Emergency Department presenting for a psychiatric evaluation. Pt speaks [place]. She was escorted by [employer] with IVC papers from [facility]. They note pt was being uncooperative at the facility, throwing herself on the floor, refusing to eat, and refusing her medications. Pt says that all of this are true. Pt notes that she has not taken her medication because she is weak and wanted to be left alone. She says she does not know why she is weak. She has been to [facility]. Pt denies visual/auditory hallucinations, SI, or HI.  Past Medical History:  Diagnosis Date  . [id number]   . Acne   . Back pain   . Essential hypertension   . Seasonal allergies     Patient Active Problem List   Diagnosis Date Noted  . Fibroid uterus 03/26/2015  . DUB (dysfunctional uterine bleeding) 03/26/2015  . Fibroid, uterine 11/22/2014  . Iron deficiency anemia due to chronic blood loss 11/22/2014  . Absolute anemia   . Syncope 09/16/2014  . Anemia 09/16/2014  . Menorrhagia 09/16/2014  . Schizoaffective disorder ([place]) 09/16/2014  . Syncope and collapse 09/16/2014  . Back pain   . Essential hypertension   . ALLERGIC RHINITIS 09/13/2006    Past Surgical History:  Procedure Laterality Date  . NO PAST SURGERIES      OB History    Gravida Para Term Preterm AB Living   3 0 0 0 3 0   SAB TAB  Ectopic Multiple Live Births   3 0 0 0         Home Medications    Prior to Admission medications   Medication Sig Start Date End Date Taking? Authorizing Provider  acetaminophen (TYLENOL) 500 MG tablet Take 1,000 mg by mouth every 6 (six) hours as needed.   Yes Historical Provider, MD  diclofenac (VOLTAREN) 75 MG EC tablet Take 1 tablet (75 mg total) by mouth 2 (two) times daily. 11/17/15  Yes Lysbeth Penner, FNP  fexofenadine (ALLEGRA) 180 MG tablet Take 180 mg by mouth daily.   Yes Historical Provider, MD  lisinopril-hydrochlorothiazide (PRINZIDE,ZESTORETIC) 20-25 MG per tablet Take 1 tablet by mouth daily.   Yes Historical Provider, MD  megestrol (MEGACE) 40 MG tablet Take 1 tablet (40 mg total) by mouth 4 (four) times daily. 12/21/15  Yes Katy Apo, NP  ferrous sulfate 325 (65 FE) MG tablet Take 1 tablet (325 mg total) by mouth daily with breakfast. Patient not taking: Reported on 12/25/2015 07/21/15   Luvenia Redden, PA-C    Family History History reviewed. No pertinent family history.  Social History Social History  Substance Use Topics  . Smoking status: Never Smoker  . Smokeless tobacco: Never Used  . Alcohol use No     Allergies   Patient has no known allergies.   Review of Systems Review of Systems  Psychiatric/Behavioral: Negative  for hallucinations and suicidal ideas.  A complete 10 system review of systems was obtained and all systems are negative except as noted in the HPI and PMH.    Physical Exam Updated Vital Signs BP 140/83 (BP Location: Right Arm)   Pulse 87   Temp 98.3 F (36.8 C) (Oral)   Resp 18   LMP 07/20/2015 (Approximate)   SpO2 100%   Physical Exam  Constitutional: She is oriented to person, place, and time. She appears well-developed and well-nourished. No distress.  Nontoxic and in NAD  HENT:  Head: Normocephalic and atraumatic.  Eyes: Conjunctivae and EOM are normal. No scleral icterus.  Neck: Normal range of motion.    Pulmonary/Chest: Effort normal. No respiratory distress.  Musculoskeletal: Normal range of motion.  Neurological: She is alert and oriented to person, place, and time. She exhibits normal muscle tone. Coordination normal.  GCS 15. Patient calm and cooperative.  Skin: Skin is warm and dry. No rash noted. She is not diaphoretic. No erythema. No pallor.  Psychiatric: She has a normal mood and affect. Her behavior is normal.  Nursing note and vitals reviewed.    ED Treatments / Results  DIAGNOSTIC STUDIES: Oxygen Saturation is 100% on RA, normal by my interpretation.  COORDINATION OF CARE:  1:02 AM Discussed treatment plan with pt at bedside and pt agreed to plan.  Labs (all labs ordered are listed, but only abnormal results are displayed) Labs Reviewed  CBC WITH DIFFERENTIAL/PLATELET - Abnormal; Notable for the following:       Result Value   RBC 5.44 (*)    Hemoglobin 11.8 (*)    HCT 34.6 (*)    MCV 63.6 (*)    MCH 21.7 (*)    RDW 20.4 (*)    All other components within normal limits  COMPREHENSIVE METABOLIC PANEL - Abnormal; Notable for the following:    Potassium 3.2 (*)    Chloride 100 (*)    Glucose, Bld 106 (*)    All other components within normal limits  ACETAMINOPHEN LEVEL - Abnormal; Notable for the following:    Acetaminophen (Tylenol), Serum <10 (*)    All other components within normal limits  SALICYLATE LEVEL  ETHANOL  RAPID URINE DRUG SCREEN, HOSP PERFORMED    EKG  EKG Interpretation None       Radiology No results found.  Procedures Procedures (including critical care time)  Medications Ordered in ED Medications  potassium chloride SA (K-DUR,KLOR-CON) CR tablet 40 mEq (not administered)  acetaminophen (TYLENOL) tablet 1,000 mg (not administered)  diclofenac (VOLTAREN) EC tablet 75 mg (not administered)  loratadine (CLARITIN) tablet 10 mg (not administered)  lisinopril-hydrochlorothiazide (PRINZIDE,ZESTORETIC) 20-25 MG per tablet 1 tablet  (not administered)     Initial Impression / Assessment and Plan / ED Course  I have reviewed the triage vital signs and the nursing notes.  Pertinent labs & imaging results that were available during my care of the patient were reviewed by me and considered in my medical decision making (see chart for details).  Clinical Course     45 year old female presents under involuntary commitment taken out by [facility]. She is calm and cooperative during her assessment. Labs reviewed and are at baseline; patient medically cleared. TTS evaluation pending.   Final Clinical Impressions(s) / ED Diagnoses   Final diagnoses:  Involuntary commitment    New Prescriptions New Prescriptions   No medications on file   I personally performed the services described in this documentation, which was scribed in  my presence. The recorded information has been reviewed and is accurate.      Antonietta Breach, PA-C 12/25/15 [id number]    April Palumbo, MD 12/25/15 [phone]

## 2015-12-25 NOTE — BHH Counselor (Signed)
Clinician contacted Raquel Sarna, RN to complete assessment and noted, pt is more fluent in [place] than [place]. Clinician noted [place] translator not available at this time, for [place] the hours are from 0700-1700. TTS will be completed during day shift.  Edd Fabian, MS, [facility], [facility] Triage Specialist [phone]

## 2015-12-25 NOTE — BH Assessment (Signed)
[place] Assessment Progress Note  Per Corena Pilgrim, MD, this pt does not require psychiatric hospitalization at this time.  Pt presents under IVC initiated by [facility], which Dr Darleene Cleaver has rescinded.  Pt is to be discharged from [facility] with recommendation to follow up with [facility].  This has been included in pt's discharge instructions.  Pt's nurse, Nena Jordan, has been notified.  Jalene Mullet, [place] Triage Specialist [phone]

## 2016-01-05 ENCOUNTER — Ambulatory Visit (HOSPITAL_COMMUNITY)
Admission: EM | Admit: 2016-01-05 | Discharge: 2016-01-05 | Disposition: A | Discharge status: Home / Self Care | Payer: Self-pay

## 2016-01-05 NOTE — ED Notes (Signed)
Cindy Garrison, cma spoke extensively with patient about medicines, refills, obtaining pcp, follow up for vaginal bleeding (well documented complaint) and follow up with dss.  Patient has been to department repeatedly for refills and guidance.  There is a mental health history and language barrier hampering patient in following up on instructions.  Patient denies wanting to see a doctor.  Patient wanted refills of megace and has 2 refills remaining on this prescription.  Patient asked several times if wanting to see a physician and patient declined.

## 2016-01-22 ENCOUNTER — Encounter: Payer: Self-pay | Admitting: Obstetrics & Gynecology

## 2016-01-22 ENCOUNTER — Ambulatory Visit (INDEPENDENT_AMBULATORY_CARE_PROVIDER_SITE_OTHER): Payer: Self-pay | Admitting: Obstetrics & Gynecology

## 2016-01-22 VITALS — BP 176/103 | HR 83 | Wt 154.0 lb

## 2016-01-22 DIAGNOSIS — N939 Abnormal uterine and vaginal bleeding, unspecified: Secondary | ICD-10-CM

## 2016-01-22 MED ORDER — DICLOFENAC SODIUM 75 MG PO TBEC: 75.0000 mg | DELAYED_RELEASE_TABLET | Freq: Two times a day (BID) | ORAL | 6 refills | Status: DC

## 2016-01-22 NOTE — Progress Notes (Signed)
   Subjective:    Patient ID: Cindy Garrison, female    DOB: 06-22-1970, 46 y.o.   MRN: [id number]  HPI 46yo French-speaking AA lady is here for a refill of her diclofenac.   Review of Systems Pap negative 1/17    Objective:   Physical Exam  [id number] Breathing and ambulating normally Video interpretor used for this encounter      Assessment & Plan:  Arthritis- refill of diclofenac given  Of note, she tells me that she "canceled the appointment for surgery" due to some issue with disability. She says that if she can't work, she can't pay her bills. She seems very frustrated by this situation.

## 2016-02-16 ENCOUNTER — Other Ambulatory Visit: Payer: Self-pay | Admitting: General Practice

## 2016-02-16 DIAGNOSIS — I10 Essential (primary) hypertension: Secondary | ICD-10-CM

## 2016-02-16 MED ORDER — LISINOPRIL-HYDROCHLOROTHIAZIDE 20-25 MG PO TABS: 1.0000 | ORAL_TABLET | Freq: Every day | ORAL | 1 refills | Status: DC

## 2016-02-16 MED ORDER — DICLOFENAC SODIUM 75 MG PO TBEC: 75.0000 mg | DELAYED_RELEASE_TABLET | Freq: Two times a day (BID) | ORAL | 6 refills | Status: DC

## 2016-02-16 MED ORDER — MEGESTROL ACETATE 40 MG PO TABS: 40.0000 mg | ORAL_TABLET | Freq: Four times a day (QID) | ORAL | 2 refills | Status: DC

## 2016-02-16 NOTE — Progress Notes (Unsigned)
Patient came by office stating she needs her meds printed out to give the prescription to [facility] because her meds are too expensive and they can help. Discussed with patient she needs a PCP for future refills of diclofenac & her BP meds but that we could give her a couple refills until then. Provided [address] number and address. Scheduled appt for 2/5 @ 830 with them. Called patient, no answer- left message containing appt info. Will send letter.

## 2016-02-19 MED FILL — MEGESTROL 40 MG TABLET: 40 | 30 days supply | Qty: 120 | Fill #0

## 2016-02-23 ENCOUNTER — Ambulatory Visit: Payer: Self-pay | Attending: Family Medicine | Admitting: Family Medicine

## 2016-02-23 ENCOUNTER — Other Ambulatory Visit: Payer: Self-pay

## 2016-02-23 VITALS — BP 127/79 | HR 112 | Temp 98.8°F | Resp 18 | Ht 62.0 in | Wt 158.0 lb

## 2016-02-23 DIAGNOSIS — Z23 Encounter for immunization: Secondary | ICD-10-CM

## 2016-02-23 DIAGNOSIS — S6991XS Unspecified injury of right wrist, hand and finger(s), sequela: Secondary | ICD-10-CM

## 2016-02-23 DIAGNOSIS — G8929 Other chronic pain: Secondary | ICD-10-CM | POA: Insufficient documentation

## 2016-02-23 DIAGNOSIS — R079 Chest pain, unspecified: Secondary | ICD-10-CM | POA: Insufficient documentation

## 2016-02-23 DIAGNOSIS — J309 Allergic rhinitis, unspecified: Secondary | ICD-10-CM | POA: Insufficient documentation

## 2016-02-23 DIAGNOSIS — M2550 Pain in unspecified joint: Secondary | ICD-10-CM

## 2016-02-23 DIAGNOSIS — I10 Essential (primary) hypertension: Secondary | ICD-10-CM | POA: Insufficient documentation

## 2016-02-23 LAB — BASIC METABOLIC PANEL WITH GFR
BUN: 12 mg/dL (ref 7–25)
CO2: 26 mmol/L (ref 20–31)
CREATININE: 0.66 mg/dL (ref 0.50–1.10)
Calcium: 9.9 mg/dL (ref 8.6–10.2)
Chloride: 97 mmol/L — ABNORMAL LOW (ref 98–110)
GFR, Est Non African American: >89 mL/min (ref >=60)
GLUCOSE: 93 mg/dL (ref 65–99)
POTASSIUM: 4.3 mmol/L (ref 3.5–5.3)
Sodium: 133 mmol/L — ABNORMAL LOW (ref 135–146)

## 2016-02-23 MED ORDER — LISINOPRIL-HYDROCHLOROTHIAZIDE 20-25 MG PO TABS: 1.0000 | ORAL_TABLET | Freq: Every day | ORAL | 2 refills | Status: DC

## 2016-02-23 MED ORDER — CAPSAICIN 0.075 % EX CREA: 1.0000 "application " | TOPICAL_CREAM | Freq: Two times a day (BID) | CUTANEOUS | 1 refills | Status: DC | PRN

## 2016-02-23 MED ORDER — FEXOFENADINE HCL 180 MG PO TABS: 180.0000 mg | ORAL_TABLET | Freq: Every day | ORAL | 6 refills | Status: DC

## 2016-02-23 MED FILL — LISINOPRIL-HCTZ 20-25 MG TA: 20-25 | 30 days supply | Qty: 30 | Fill #0

## 2016-02-23 NOTE — Progress Notes (Signed)
Subjective:  Patient ID: Cindy Garrison, female    DOB: 1971-01-02  Age: 46 y.o. MRN: [id number]  CC: No chief complaint on file.   HPI Cindy Garrison presents for hypertension and chronic joint pain. Reports history of chronic joint pain for 5 years. Arthralgias in the bilateral shoulders, back, and chest areas. She denies any history of injury. She c/o pain to the 2nd digit of the right hand. She reports 2 weeks ago something sticking into her right index finger.     From providers at [facility]  Olanzapine 5mg  QHS dakoriya, swati 12/23/15 stopped taking med  Metoprolol 25 mg 12/24/15 bakken, whitney  History of chronic joint pain 5 years Fingertip right index finger no swelling    Outpatient Medications Prior to Visit  Medication Sig Dispense Refill  . acetaminophen (TYLENOL) 500 MG tablet Take 1,000 mg by mouth every 6 (six) hours as needed.    . diclofenac (VOLTAREN) 75 MG EC tablet Take 1 tablet (75 mg total) by mouth 2 (two) times daily. 60 tablet 6  . ferrous sulfate 325 (65 FE) MG tablet Take 1 tablet (325 mg total) by mouth daily with breakfast. (Patient not taking: Reported on 01/22/2016) 30 tablet 3  . megestrol (MEGACE) 40 MG tablet Take 1 tablet (40 mg total) by mouth 4 (four) times daily. 120 tablet 2  . fexofenadine (ALLEGRA) 180 MG tablet Take 180 mg by mouth daily.    Marland Kitchen lisinopril-hydrochlorothiazide (PRINZIDE,ZESTORETIC) 20-25 MG tablet Take 1 tablet by mouth daily. 30 tablet 1   No facility-administered medications prior to visit.     ROS Review of Systems  Respiratory: Negative.   Cardiovascular: Negative.   Gastrointestinal: Negative.   Musculoskeletal: Positive for arthralgias, back pain and myalgias.  Allergic/Immunologic: Negative.   Hematological: Negative.   Psychiatric/Behavioral:       History of schizoaffective disorder     Objective:    BP/Weight 02/23/2016 01/22/2016 [id number]  Systolic BP [id number] 0000000  Diastolic BP 79 [id number]  Wt. (Lbs)  158 154 -  BMI 28.9 30.08 -     Physical Exam  Cardiovascular: Normal rate, regular rhythm, normal heart sounds and intact distal pulses.   Pulmonary/Chest: Effort normal and breath sounds normal.  Abdominal: Soft. Bowel sounds are normal.  Musculoskeletal: Normal range of motion.  Pain to bilateral shoulder joints, lower back pain; chest pain with palpation.   Skin: Skin is warm and dry.  Small pinpoint area to 2nd digit of right hand. No swelling, redness, or drainage.   Psychiatric: Her speech is normal. Her affect is blunt. She is agitated.  Nursing note and vitals reviewed.   Assessment & Plan:   Problem List Items Addressed This Visit      Cardiovascular and Mediastinum   Essential hypertension - Primary   Relevant Medications   lisinopril-hydrochlorothiazide (PRINZIDE,ZESTORETIC) 20-25 MG tablet   Other Relevant Orders   BASIC METABOLIC PANEL WITH GFR (Completed)   Microalbumin/Creatinine Ratio, Urine (Completed)     Respiratory   Allergic rhinitis   Relevant Medications   fexofenadine (ALLEGRA ALLERGY) 180 MG tablet    Other Visit Diagnoses    Chronic pain of multiple joints       Relevant Medications   capsicum (ZOSTRIX) 0.075 % topical cream   Other Relevant Orders   ANA, IFA Comprehensive Panel (Completed)   Rheumatoid factor (Completed)   Injury of finger of right hand, sequela       Relevant Orders   DG Finger  Index Right   Chest pain, unspecified type       Relevant Orders   EKG 12-Lead   Needs flu shot       Relevant Orders   Flu Vaccine QUAD 36+ mos PF IM (Fluarix & Fluzone Quad PF) (Completed)      Meds ordered this encounter  Medications  . lisinopril-hydrochlorothiazide (PRINZIDE,ZESTORETIC) 20-25 MG tablet    Sig: Take 1 tablet by mouth daily.    Dispense:  30 tablet    Refill:  2    Order Specific Question:   Supervising Provider    Answer:   Tresa Garter [id number]  . capsicum (ZOSTRIX) 0.075 % topical cream    Sig: Apply 1  application topically 2 (two) times daily as needed.    Dispense:  56 g    Refill:  1    Order Specific Question:   Supervising Provider    Answer:   Tresa Garter [id number]  . fexofenadine (ALLEGRA ALLERGY) 180 MG tablet    Sig: Take 1 tablet (180 mg total) by mouth daily.    Dispense:  24 tablet    Refill:  6    Order Specific Question:   Supervising Provider    Answer:   Tresa Garter [id number]    Follow-up: Return in about 3 months (around 05/22/2016) for Hypertension. Alfonse Spruce FNP

## 2016-02-23 NOTE — Patient Instructions (Signed)
Hypertension Hypertension is another name for high blood pressure. High blood pressure forces your heart to work harder to pump blood. A blood pressure reading has two numbers, which includes a higher number over a lower number (example: 110/72). Follow these instructions at home:  Have your blood pressure rechecked by your doctor.  Only take medicine as told by your doctor. Follow the directions carefully. The medicine does not work as well if you skip doses. Skipping doses also puts you at risk for problems.  Do not smoke.  Monitor your blood pressure at home as told by your doctor. Contact a doctor if:  You think you are having a reaction to the medicine you are taking.  You have repeat headaches or feel dizzy.  You have puffiness (swelling) in your ankles.  You have trouble with your vision. Get help right away if:  You get a very bad headache and are confused.  You feel weak, numb, or faint.  You get chest or belly (abdominal) pain.  You throw up (vomit).  You cannot breathe very well. This information is not intended to replace advice given to you by your health care provider. Make sure you discuss any questions you have with your health care provider. Document Released: 06/23/2007 Document Revised: 06/12/2015 Document Reviewed: 10/27/2012 Elsevier Interactive Patient Education  2017 Elsevier Inc.  

## 2016-02-23 NOTE — Progress Notes (Signed)
Patient is here for HTN  Patient complains of intermittent 5 year chest, left shoulder and left hand pain. Pain is not present currently. Patient complains of a headache being present this morning. Patient took tylenol which relieved the pain.  Patient has taken medication today. Patient has eaten today.  Patient would like the flu vaccine today. Patient tolerated the flu vaccine well today.

## 2016-02-24 LAB — ANA, IFA COMPREHENSIVE PANEL
ANA: POSITIVE — AB
ENA SM Ab Ser-aCnc: <1
SM/RNP: NEGATIVE
SSA (Ro) (ENA) Antibody, IgG: <1
SSB (La) (ENA) Antibody, IgG: <1
Scleroderma (Scl-70) (ENA) Antibody, IgG: <1
ds DNA Ab: <1 IU/mL

## 2016-02-24 LAB — MICROALBUMIN / CREATININE URINE RATIO
CREATININE, URINE: 31 mg/dL (ref 20–320)
Microalb Creat Ratio: 6 mcg/mg creat (ref <30)
Microalb, Ur: 0.2 mg/dL

## 2016-02-24 LAB — ANTI-NUCLEAR AB-TITER (ANA TITER)

## 2016-02-24 LAB — RHEUMATOID FACTOR: Rhuematoid fact SerPl-aCnc: <14 IU/mL (ref <14)

## 2016-03-02 ENCOUNTER — Telehealth: Payer: Self-pay

## 2016-03-02 ENCOUNTER — Other Ambulatory Visit: Payer: Self-pay | Admitting: Family Medicine

## 2016-03-02 DIAGNOSIS — R768 Other specified abnormal immunological findings in serum: Secondary | ICD-10-CM

## 2016-03-02 NOTE — Telephone Encounter (Signed)
Interpreter name Delila Spence  Interpreter ID # [phone]  CMA call to go over lab results   Patient did not answer but CMA left a VM the reason of the call & to call us back &  the interpreter went after to translate and left the VM in their language

## 2016-03-02 NOTE — Telephone Encounter (Signed)
-----   Message from Alfonse Spruce, [place] sent at 03/02/2016  8:24 AM EST ----- -ANA test which evaluates diseases or conditions which cause inflammation is positive. You will be referred to a rheumatologist. -RF test which test for rheumatoid arthritis is negative.  -Microalbumin/creatinine ratio level was normal. This tests for protein in your urine that can indicate early signs of kidney damage.  -Kidney function normal

## 2016-03-04 ENCOUNTER — Other Ambulatory Visit: Payer: Self-pay

## 2016-03-04 MED ORDER — FERROUS SULFATE 325 (65 FE) MG PO TABS: 325.0000 mg | ORAL_TABLET | Freq: Every day | ORAL | 3 refills | Status: DC

## 2016-03-04 NOTE — Telephone Encounter (Signed)
Pt came to front desk requesting a refill on her iron.  Pt iron e-prescribed as requested.  Pt then requests via Video Interpreter to have a letter for her to take to work stating that she has bleeding issues that causes her to have dizziness which she then has to call out of work.  I explained to the pt that she will have to be evaluated with the provider due to the provider has not taken her out of work that will be concern she can discuss with the provider. After repeating several times that we can not write a letter stating that and the need schedule an appt pt received appt for 03/18/16 for evaluation.  Pt stated thank you with no further questions.

## 2016-03-09 ENCOUNTER — Other Ambulatory Visit: Payer: Self-pay

## 2016-03-09 MED ORDER — DICLOFENAC SODIUM 75 MG PO TBEC: 75.0000 mg | DELAYED_RELEASE_TABLET | Freq: Two times a day (BID) | ORAL | 4 refills | Status: DC

## 2016-03-09 MED ORDER — MEGESTROL ACETATE 40 MG PO TABS: 40.0000 mg | ORAL_TABLET | Freq: Four times a day (QID) | ORAL | 0 refills | Status: DC

## 2016-03-09 MED FILL — ?DICLOFENAC SOD DR 75 MG TA: 75 | 30 days supply | Qty: 60 | Fill #0

## 2016-03-09 MED FILL — MEGESTROL 40 MG TABLET: 40 | 30 days supply | Qty: 120 | Fill #0

## 2016-03-09 NOTE — Telephone Encounter (Signed)
Pt came to the front office requesting a refill on Diclofenac and Megace.  Pt has an appt scheduled for 03/18/16 with Dr. Hulan Fray for bleeding evaluation.  Pt's chart showed that both medication prescriptions were printed out.  Pt requested to have her medication sent to [place].  E-prescribed both medications as pt requested.

## 2016-03-18 ENCOUNTER — Ambulatory Visit: Payer: Self-pay | Admitting: Obstetrics & Gynecology

## 2016-03-25 ENCOUNTER — Other Ambulatory Visit: Payer: Self-pay

## 2016-03-25 MED ORDER — MEGESTROL ACETATE 40 MG PO TABS: 40.0000 mg | ORAL_TABLET | Freq: Four times a day (QID) | ORAL | 0 refills | Status: DC

## 2016-03-26 MED ORDER — MEGESTROL ACETATE 40 MG PO TABS: 40.0000 mg | ORAL_TABLET | Freq: Four times a day (QID) | ORAL | 0 refills | Status: DC

## 2016-03-26 NOTE — Telephone Encounter (Signed)
[facility] presented to [facility] when she went. A paper prescription was sent from [facility].

## 2016-03-31 ENCOUNTER — Ambulatory Visit (INDEPENDENT_AMBULATORY_CARE_PROVIDER_SITE_OTHER): Payer: Self-pay | Admitting: Family Medicine

## 2016-03-31 ENCOUNTER — Other Ambulatory Visit (HOSPITAL_COMMUNITY)
Admission: RE | Admit: 2016-03-31 | Discharge: 2016-03-31 | Disposition: A | Discharge status: Home / Self Care | Payer: Self-pay | Source: Ambulatory Visit | Attending: Family Medicine | Admitting: Family Medicine

## 2016-03-31 ENCOUNTER — Encounter: Payer: Self-pay | Admitting: Family Medicine

## 2016-03-31 VITALS — BP 169/99 | HR 114 | Ht 61.0 in | Wt 155.0 lb

## 2016-03-31 DIAGNOSIS — Z3202 Encounter for pregnancy test, result negative: Secondary | ICD-10-CM

## 2016-03-31 DIAGNOSIS — F250 Schizoaffective disorder, bipolar type: Secondary | ICD-10-CM

## 2016-03-31 DIAGNOSIS — N921 Excessive and frequent menstruation with irregular cycle: Secondary | ICD-10-CM

## 2016-03-31 DIAGNOSIS — I10 Essential (primary) hypertension: Secondary | ICD-10-CM

## 2016-03-31 DIAGNOSIS — N938 Other specified abnormal uterine and vaginal bleeding: Secondary | ICD-10-CM

## 2016-03-31 DIAGNOSIS — Z124 Encounter for screening for malignant neoplasm of cervix: Secondary | ICD-10-CM

## 2016-03-31 DIAGNOSIS — D259 Leiomyoma of uterus, unspecified: Secondary | ICD-10-CM

## 2016-03-31 DIAGNOSIS — Z1151 Encounter for screening for human papillomavirus (HPV): Secondary | ICD-10-CM

## 2016-03-31 MED ORDER — MEGESTROL ACETATE 40 MG PO TABS: 40.0000 mg | ORAL_TABLET | Freq: Four times a day (QID) | ORAL | 2 refills | Status: DC

## 2016-03-31 MED FILL — MEGESTROL 40 MG TABLET: 40 | 30 days supply | Qty: 120 | Fill #0

## 2016-03-31 NOTE — Patient Instructions (Signed)
Saignement post-mnopausique Saignement post-mnopausique est tout saignement aprs la mnopause. La mnopause est quand la priode d'une femme s'arrte. Tout type de saignement aprs la mnopause est proccupant. Cela devrait tre vrifi par votre mdecin. Tout traitement dpendra de la cause. Noreene Larsson ces instructions  la maison: Occupational psychologist votre condition pour [employer]. vitez l'utilisation de tampons et douches comme indiqu par [employer]. Changez souvent vos pads. Fae Pippin des examens pelviens rguliers et des tests Pap. Gardez tous les rendez-vous pour les tests comme indiqu par [employer]. Contactez un mdecin si: Votre saignement dure plus d'une semaine. Vous avez mal au ventre (abdominal). Vous avez des saignements aprs un rapport sexuel. Fae Pippin de l'aide immdiatement si: Vous avez de la fivre, des frissons, des maux de tte, des tourdissements, des douleurs musculaires et des saignements. Vous avez une forte [employer]. Vous avez des [employer] de sang (caillots sanguins) provenant de votre vagin. Vous avez des saignements et avez besoin de plus d'un tampon par heure. Vous avez l'impression que vous allez vous vanouir. Cette information n'est pas destine  remplacer les conseils donns par votre fournisseur de soins de sant. Assurez-vous de [facility] questions que vous Art therapist votre fournisseur de soins de sant. Document Diffusion: 10/14/2007 Document rvis: 06/12/2015 Document rvis: 17/07/2012 Elsevier Interactive Patient Education  2017 [employer].

## 2016-03-31 NOTE — Assessment & Plan Note (Signed)
Though she complains of daily bleeding, last hgb was 11.8 in 12/17. Offered D & c with hysteroscopy, but patient becomes more animated and upset, talking about getting money from the government before being allowed to get her surgery. Just wants to continue her megace.

## 2016-03-31 NOTE — Assessment & Plan Note (Signed)
Not well controlled. She told me she takes her meds and that she has no primary care MD--although she was seen be int. Med. 02/2016--diclofenac is not helping, but she does not want to be without this.

## 2016-03-31 NOTE — Progress Notes (Signed)
Subjective:    Patient ID: Cindy Garrison is a 46 y.o. female presenting with Follow-up  on 03/31/2016  HPI: [place] interpreter used [phone]. This is a difficult history to obtain due to patient's mental status. This is the first time I am seeing her. She has reported postmenopausal bleeding and has been scheduled for a D and C and has canceled and been unable to get charity care. She reports using Megace and Diclofenac and staff reports that this is all she wants. When asked why she is here, she states, "Medicine is no longer working." Taking it 6-7 times daily. Denies she was through menopause. States that sometimes she has her period and sometimes she does not. Reports taking her Megace between 4 and 7 times daily. States that she needs her diclofenac and that is the only thing that helps her pain. She has h/o EMB that was normal in 03/2015.  Review of Systems  Constitutional: Negative for chills and fever.  Respiratory: Negative for shortness of breath.   Cardiovascular: Negative for chest pain.  Gastrointestinal: Negative for abdominal pain, nausea and vomiting.  Genitourinary: Negative for dysuria.  Skin: Negative for rash.      Objective:    BP (!) 169/99   Pulse (!) 114   Ht 5\' 1"  (1.549 m)   Wt 155 lb (70.3 kg)   LMP 07/20/2015 (Approximate)   BMI 29.29 kg/m  Physical Exam  Constitutional: She is oriented to person, place, and time. She appears well-developed and well-nourished. No distress.  HENT:  Head: Normocephalic and atraumatic.  Eyes: No scleral icterus.  Neck: Neck supple.  Cardiovascular: Normal rate.   Pulmonary/Chest: Effort normal.  Abdominal: Soft.  Genitourinary:  Genitourinary Comments: BUS normal, vagina is pink and rugated, cervix is nulliparous without lesion.   Neurological: She is alert and oriented to person, place, and time.  Skin: Skin is warm and dry.  Psychiatric: She has a normal mood and affect.   Procedure: Patient given informed  consent, signed copy in the chart, time out was performed. Appropriate time out taken. . The patient was placed in the lithotomy position and the cervix brought into view with sterile speculum.  Portio of cervix cleansed x 2 with betadine swabs.  A tenaculum was placed in the anterior lip of the cervix.  The uterus was sounded for depth of 8 cn. A pipelle was introduced to into the uterus, suction created, and an endometrial sample was obtained. While removing the pipelle, the patient moved up the bed and self removed the speculum and tenaculum and got up and put her pants on.   CBC    Component Value Date/Time   WBC 9.5 12/25/2015 0142   RBC 5.44 (H) 12/25/2015 0142   HGB 11.8 (L) 12/25/2015 0142   HCT 34.6 (L) 12/25/2015 0142   PLT 354 12/25/2015 0142   MCV 63.6 (L) 12/25/2015 0142   MCH 21.7 (L) 12/25/2015 0142   MCHC 34.1 12/25/2015 0142   RDW 20.4 (H) 12/25/2015 0142   LYMPHSABS 2.1 12/25/2015 0142   MONOABS 0.8 12/25/2015 0142   EOSABS 0.1 12/25/2015 0142   BASOSABS 0.0 12/25/2015 0142   Pelvic sono 03/23/15 FINDINGS: Uterus Measurements: 12.3 x 6.0 x 13.2 cm. Multiple fibroids are present with the largest measuring 6 cm. Present.  Endometrium Thickness: 19 mm.  A 3.1 cm endometrial or submucosal mass is noted.  Right ovary Measurements: 3.4 x 2.2 x 2.3 cm. Normal appearance/no adnexal mass.  Left ovary Measurements: 3.5  x 2.6 x 3.5 cm. Normal appearance/no adnexal mass.  IMPRESSION: 1. Multiple fibroids.  2. Endometrial thickening to 19 mm. A 3.1 cm endometrial or submucosal mass is noted.  A focal endometrial lesion issuspected. Consider sonohysterogram forfurther evaluation, prior to hysteroscopy or endometrial biopsy.   Assessment & Plan:   Problem List Items Addressed This Visit      Unprioritized   Menorrhagia (Chronic)    Though she complains of daily bleeding, last hgb was 11.8 in 12/17. Offered D & c with hysteroscopy, but patient becomes more  animated and upset, talking about getting money from the government before being allowed to get her surgery. Just wants to continue her megace.      Schizoaffective disorder ([place]) (Chronic)   Essential hypertension - Primary    Not well controlled. She told me she takes her meds and that she has no primary care MD--although she was seen be int. Med. 02/2016--diclofenac is not helping, but she does not want to be without this.      Fibroid uterus   DUB (dysfunctional uterine bleeding)   Relevant Medications   megestrol (MEGACE) 40 MG tablet   Other Relevant Orders   Surgical pathology    Other Visit Diagnoses    Screening for cervical cancer       Relevant Orders   Cytology - PAP   Negative pregnancy test          Total face-to-face time with patient: 25 minutes. Over 50% of encounter was spent on counseling and coordination of care. Return in about 3 months (around 07/01/2016).  Donnamae Jude 03/31/2016 2:34 PM

## 2016-04-01 LAB — POCT PREGNANCY, URINE: PREG TEST UR: NEGATIVE

## 2016-04-02 LAB — CYTOLOGY - PAP
Diagnosis: NEGATIVE
HPV: NOT DETECTED

## 2016-04-15 ENCOUNTER — Ambulatory Visit (HOSPITAL_COMMUNITY)
Admission: EM | Admit: 2016-04-15 | Discharge: 2016-04-15 | Disposition: A | Discharge status: Home / Self Care | Payer: Self-pay

## 2016-04-15 ENCOUNTER — Encounter (HOSPITAL_COMMUNITY): Payer: Self-pay | Admitting: Emergency Medicine

## 2016-04-15 ENCOUNTER — Telehealth (HOSPITAL_COMMUNITY): Payer: Self-pay | Admitting: Emergency Medicine

## 2016-04-15 DIAGNOSIS — Z760 Encounter for issue of repeat prescription: Secondary | ICD-10-CM

## 2016-04-15 NOTE — ED Triage Notes (Signed)
Pt needing refill on megestrol 40 mg for abn vag bleeding  Pt has 2 refill left on bottles but states [facility] and [facility]... She has tried to get medication transferred from [facility] w/no success  Pt would like paper rx if possible.   A&O x4... NAD

## 2016-04-15 NOTE — Discharge Instructions (Signed)
Please follow up with Dr. Kennon Rounds for megace prescription.  Dr. Kennon Rounds is prescribing you this medicine.

## 2016-04-15 NOTE — Telephone Encounter (Signed)
Patient seen in the urgent care.  Patient [facility] [address]  [facility] and left message with patient's request.  Left contact number for this nurse at [place]

## 2016-04-15 NOTE — ED Provider Notes (Signed)
CSN: [id number]     Arrival date & time 04/15/16  1013 History   None    Chief Complaint  Patient presents with  . Medication Refill   (Consider location/radiation/quality/duration/timing/severity/associated sxs/prior Treatment) Patient wanting refill on megace.  She is seen at [facility]  [facility]. She states she could not get refill because it is too early and wants me to fill her rx.  She has about 20 pills left in her bottle.   The history is provided by the patient.  Medication Refill  Medications/supplies requested:  Megace Reason for request:  Clinic/provider not available Medications taken before: yes - see home medications   Patient has complete original prescription information: yes     Past Medical History:  Diagnosis Date  . #[id number]   . Acne   . Back pain   . Essential hypertension   . Seasonal allergies    Past Surgical History:  Procedure Laterality Date  . NO PAST SURGERIES     History reviewed. No pertinent family history. Social History  Substance Use Topics  . Smoking status: Never Smoker  . Smokeless tobacco: Never Used  . Alcohol use No   OB History    Gravida Para Term Preterm AB Living   3 0 0 0 3 0   SAB TAB Ectopic Multiple Live Births   3 0 0 0       Review of Systems  Constitutional: Negative.   HENT: Negative.   Eyes: Negative.   Respiratory: Negative.   Cardiovascular: Negative.   Gastrointestinal: Negative.   Endocrine: Negative.   Genitourinary: Negative.   Musculoskeletal: Negative.   Allergic/Immunologic: Negative.   Neurological: Negative.   Hematological: Negative.   Psychiatric/Behavioral: Negative.     Allergies  Patient has no known allergies.  Home Medications   Prior to Admission medications   Medication Sig Start Date End Date Taking? Authorizing Provider  acetaminophen (TYLENOL) 500 MG tablet Take 1,000 mg by mouth every 6 (six) hours as needed.    Historical  Provider, MD  capsicum (ZOSTRIX) 0.075 % topical cream Apply 1 application topically 2 (two) times daily as needed. 02/23/16   Alfonse Spruce, FNP  diclofenac (VOLTAREN) 75 MG EC tablet Take 1 tablet (75 mg total) by mouth 2 (two) times daily. 03/09/16   Truett Mainland, DO  ferrous sulfate 325 (65 FE) MG tablet Take 1 tablet (325 mg total) by mouth daily with breakfast. Patient not taking: Reported on 03/31/2016 03/04/16   Osborne Oman, MD  fexofenadine (ALLEGRA ALLERGY) 180 MG tablet Take 1 tablet (180 mg total) by mouth daily. 02/23/16   Alfonse Spruce, FNP  lisinopril-hydrochlorothiazide (PRINZIDE,ZESTORETIC) 20-25 MG tablet Take 1 tablet by mouth daily. 02/23/16   Alfonse Spruce, FNP  megestrol (MEGACE) 40 MG tablet Take 1 tablet (40 mg total) by mouth 4 (four) times daily. 03/31/16   Donnamae Jude, MD   Meds Ordered and Administered this Visit  Medications - No data to display  BP (!) 152/79 (BP Location: Right Arm)   Temp 98.4 F (36.9 C) (Oral)   Resp 16   LMP 07/20/2015 (Approximate)   SpO2 98%  No data found.   Physical Exam  Constitutional: She appears well-developed and well-nourished.  HENT:  Head: Normocephalic and atraumatic.  Eyes: Conjunctivae and EOM are normal. Pupils are equal, round, and reactive to light.  Neck: Normal range of motion. Neck supple.  Cardiovascular: Normal rate,  regular rhythm and normal heart sounds.   Pulmonary/Chest: Effort normal and breath sounds normal.  Neurological: She is alert.  Nursing note and vitals reviewed.   Urgent Care Course     Procedures (including critical care time)  Labs Review Labs Reviewed - No data to display  Imaging Review No results found.   Visual Acuity Review  Right Eye Distance:   Left Eye Distance:   Bilateral Distance:    Right Eye Near:   Left Eye Near:    Bilateral Near:         MDM   1. Medication refill    Follow up with Dr. Kennon Rounds for Megace refill      Lysbeth Penner, [place] 04/15/16 1154

## 2016-04-19 MED FILL — MEGESTROL 40 MG TABLET: 40 | 30 days supply | Qty: 120 | Fill #1

## 2016-04-20 ENCOUNTER — Other Ambulatory Visit: Payer: Self-pay | Admitting: Obstetrics and Gynecology

## 2016-04-20 DIAGNOSIS — N938 Other specified abnormal uterine and vaginal bleeding: Secondary | ICD-10-CM

## 2016-05-13 NOTE — Telephone Encounter (Signed)
Received message from pharmacy concerning patient refill on megestrol. Will refill

## 2016-05-17 MED FILL — MEGESTROL 40 MG TABLET: 40 | 30 days supply | Qty: 120 | Fill #2

## 2016-06-10 ENCOUNTER — Encounter (HOSPITAL_COMMUNITY): Payer: Self-pay | Admitting: Emergency Medicine

## 2016-06-10 ENCOUNTER — Ambulatory Visit (HOSPITAL_COMMUNITY)
Admission: EM | Admit: 2016-06-10 | Discharge: 2016-06-10 | Disposition: A | Discharge status: Home / Self Care | Payer: Self-pay | Attending: Family Medicine | Admitting: Family Medicine

## 2016-06-10 DIAGNOSIS — M25562 Pain in left knee: Secondary | ICD-10-CM

## 2016-06-10 DIAGNOSIS — M79622 Pain in left upper arm: Secondary | ICD-10-CM

## 2016-06-10 DIAGNOSIS — Z79899 Other long term (current) drug therapy: Secondary | ICD-10-CM

## 2016-06-10 MED ORDER — PREDNISONE 20 MG PO TABS: ORAL_TABLET | ORAL | 0 refills | Status: DC

## 2016-06-10 MED ORDER — FEXOFENADINE-PSEUDOEPHED ER 60-120 MG PO TB12: 1.0000 | ORAL_TABLET | Freq: Two times a day (BID) | ORAL | 3 refills | Status: DC

## 2016-06-10 MED FILL — MEGESTROL 40 MG TABLET: 40 | 30 days supply | Qty: 120 | Fill #1

## 2016-06-10 MED FILL — ?PREDNISONE 20 MG TABLET: 20 | 5 days supply | Qty: 10 | Fill #0

## 2016-06-10 NOTE — Discharge Instructions (Signed)
Please make appointment with the clinic noted below

## 2016-06-10 NOTE — ED Triage Notes (Signed)
Medication refill

## 2016-06-10 NOTE — ED Provider Notes (Signed)
[place]    CSN: [id number] Arrival date & time: 06/10/16  1000     History   Chief Complaint Chief Complaint  Patient presents with  . Medication Refill    HPI Cindy Garrison is a 46 y.o. female.   This a 46 year old very angry woman from [place] who comes here periodically to complain about a variety of pains including her left leg, left arm, and chest. She's had these problems for a long time. Her chest has been hurting for about 2 weeks intermittently. It's not made worse by movement, food, or work.  Patient does work in a [employer] and request a half is give her a vacation. I explained to her that this falls under the prone review of her employer. She went on to explain that God will judge [place] accordingly.  Patient would also like a refill on her Allegra-D medicine.  Translator used for communication. Patient speaks [place] and EWE.      Past Medical History:  Diagnosis Date  . #[id number]   . Acne   . Back pain   . Essential hypertension   . Seasonal allergies     Patient Active Problem List   Diagnosis Date Noted  . Fibroid uterus 03/26/2015  . DUB (dysfunctional uterine bleeding) 03/26/2015  . Iron deficiency anemia due to chronic blood loss 11/22/2014  . Absolute anemia   . Syncope 09/16/2014  . Anemia 09/16/2014  . Menorrhagia 09/16/2014  . Schizoaffective disorder ([place]) 09/16/2014  . Syncope and collapse 09/16/2014  . Back pain   . Essential hypertension   . Allergic rhinitis 09/13/2006    Past Surgical History:  Procedure Laterality Date  . NO PAST SURGERIES      OB History    Gravida Para Term Preterm AB Living   3 0 0 0 3 0   SAB TAB Ectopic Multiple Live Births   3 0 0 0         Home Medications    Prior to Admission medications   Medication Sig Start Date End Date Taking? Authorizing Provider  acetaminophen (TYLENOL) 500 MG tablet Take 1,000 mg by mouth every 6 (six) hours as needed.    [provider]  capsicum (ZOSTRIX) 0.075 % topical cream Apply 1 application topically 2 (two) times daily as needed. 02/23/16   Alfonse Spruce, FNP  diclofenac (VOLTAREN) 75 MG EC tablet Take 1 tablet (75 mg total) by mouth 2 (two) times daily. 03/09/16   Truett Mainland, DO  ferrous sulfate 325 (65 FE) MG tablet Take 1 tablet (325 mg total) by mouth daily with breakfast. Patient not taking: Reported on 03/31/2016 03/04/16   Anyanwu, Sallyanne Havers, MD  fexofenadine (ALLEGRA ALLERGY) 180 MG tablet Take 1 tablet (180 mg total) by mouth daily. 02/23/16   Alfonse Spruce, FNP  fexofenadine-pseudoephedrine (ALLEGRA-D) 60-120 MG 12 hr tablet Take 1 tablet by mouth every 12 (twelve) hours. 06/10/16   Robyn Haber, MD  lisinopril-hydrochlorothiazide (PRINZIDE,ZESTORETIC) 20-25 MG tablet Take 1 tablet by mouth daily. 02/23/16   Alfonse Spruce, FNP  megestrol (MEGACE) 40 MG tablet TAKE 1 TABLET BY MOUTH 4 TIMES A DAY 05/13/16   Donnamae Jude, MD  predniSONE (DELTASONE) 20 MG tablet Two daily with food 06/10/16   Robyn Haber, MD    Family History No family history on file.  Social History Social History  Substance Use Topics  . Smoking status: Never Smoker  . Smokeless tobacco: Never Used  .  Alcohol use No     Allergies   Patient has no known allergies.   Review of Systems Review of Systems  All other systems reviewed and are negative.    Physical Exam Triage Vital Signs ED Triage Vitals [06/10/16 1033]  Enc Vitals Group     BP (!) 144/91     Pulse Rate 83     Resp 18     Temp 98.3 F (36.8 C)     Temp Source Oral     SpO2 98 %     Weight      Height      Head Circumference      Peak Flow      Pain Score      Pain Loc      Pain Edu?      Excl. in [place]?    No data found.   Updated Vital Signs BP (!) 144/91 (BP Location: Left Arm)   Pulse 83   Temp 98.3 F (36.8 C) (Oral)   Resp 18   LMP 07/20/2015 (Approximate)   SpO2 98%   Visual Acuity Right Eye  Distance:   Left Eye Distance:   Bilateral Distance:    Right Eye Near:   Left Eye Near:    Bilateral Near:     Physical Exam  Constitutional: She is oriented to person, place, and time. She appears well-developed and well-nourished.  HENT:  Right Ear: External ear normal.  Left Ear: External ear normal.  Mouth/Throat: Oropharynx is clear and moist.  Eyes: Conjunctivae and EOM are normal. Pupils are equal, round, and reactive to light.  Neck: Normal range of motion. Neck supple.  Cardiovascular: Normal rate, regular rhythm and normal heart sounds.   Pulmonary/Chest: Effort normal and breath sounds normal.  Musculoskeletal: Normal range of motion.  Neurological: She is alert and oriented to person, place, and time.  Skin: Skin is warm and dry.  Nursing note and vitals reviewed.    UC Treatments / Results  Labs (all labs ordered are listed, but only abnormal results are displayed) Labs Reviewed - No data to display  EKG  EKG Interpretation None       Radiology No results found.  Procedures Procedures (including critical care time)  Medications Ordered in UC Medications - No data to display   Initial Impression / Assessment and Plan / UC Course  I have reviewed the triage vital signs and the nursing notes.  Pertinent labs & imaging results that were available during my care of the patient were reviewed by me and considered in my medical decision making (see chart for details).     Final Clinical Impressions(s) / UC Diagnoses   Final diagnoses:  Medication management  Left upper arm pain  Left knee pain, unspecified chronicity    New Prescriptions New Prescriptions   FEXOFENADINE-PSEUDOEPHEDRINE (ALLEGRA-D) 60-120 MG 12 HR TABLET    Take 1 tablet by mouth every 12 (twelve) hours.   PREDNISONE (DELTASONE) 20 MG TABLET    Two daily with food     Robyn Haber, MD 06/10/16 1108

## 2016-06-11 MED FILL — MEGESTROL 40 MG TABLET: 40 | 30 days supply | Qty: 120 | Fill #2 | Status: TO

## 2016-07-07 ENCOUNTER — Ambulatory Visit (HOSPITAL_COMMUNITY)
Admission: EM | Admit: 2016-07-07 | Discharge: 2016-07-07 | Disposition: A | Discharge status: Home / Self Care | Payer: Self-pay | Attending: Family Medicine | Admitting: Family Medicine

## 2016-07-07 ENCOUNTER — Encounter (HOSPITAL_COMMUNITY): Payer: Self-pay | Admitting: Emergency Medicine

## 2016-07-07 DIAGNOSIS — M542 Cervicalgia: Secondary | ICD-10-CM

## 2016-07-07 DIAGNOSIS — M940 Chondrocostal junction syndrome [Tietze]: Secondary | ICD-10-CM

## 2016-07-07 MED ORDER — FEXOFENADINE-PSEUDOEPHED ER 60-120 MG PO TB12: 1.0000 | ORAL_TABLET | Freq: Two times a day (BID) | ORAL | 3 refills | Status: DC

## 2016-07-07 MED ORDER — NAPROXEN 500 MG PO TABS: 500.0000 mg | ORAL_TABLET | Freq: Two times a day (BID) | ORAL | 0 refills | Status: DC

## 2016-07-07 MED FILL — NAPROXEN 500 MG TABLET: 500 | 7 days supply | Qty: 14 | Fill #0

## 2016-07-07 NOTE — ED Triage Notes (Signed)
Chest and bones hurt.  During nursing assessment, patient getting translater to spell many words.  Pain started yesterday.  Denies fever.

## 2016-07-07 NOTE — ED Notes (Signed)
[id number]-french interpreter

## 2016-07-07 NOTE — ED Notes (Signed)
Unable to locate a "[place]" language interpreter

## 2016-07-07 NOTE — ED Provider Notes (Signed)
CSN: [id number]     Arrival date & time 07/07/16  1313 History   None    Chief Complaint  Patient presents with  . Muscle Pain   (Consider location/radiation/quality/duration/timing/severity/associated sxs/prior Treatment) Patient c/o chest wall discomfort and neck discomfort for a day.   The history is provided by the patient.  Muscle Pain  This is a new problem. The problem occurs constantly. The problem has not changed since onset.Nothing aggravates the symptoms.    Past Medical History:  Diagnosis Date  . #[id number]   . Acne   . Back pain   . Essential hypertension   . Seasonal allergies    Past Surgical History:  Procedure Laterality Date  . NO PAST SURGERIES     No family history on file. Social History  Substance Use Topics  . Smoking status: Never Smoker  . Smokeless tobacco: Never Used  . Alcohol use No   OB History    Gravida Para Term Preterm AB Living   3 0 0 0 3 0   SAB TAB Ectopic Multiple Live Births   3 0 0 0       Review of Systems  Constitutional: Negative.   HENT: Negative.   Eyes: Negative.   Respiratory: Negative.   Cardiovascular: Negative.   Gastrointestinal: Negative.   Endocrine: Negative.   Genitourinary: Negative.   Musculoskeletal: Positive for arthralgias.  Allergic/Immunologic: Negative.   Neurological: Negative.   Hematological: Negative.   Psychiatric/Behavioral: Negative.     Allergies  Patient has no known allergies.  Home Medications   Prior to Admission medications   Medication Sig Start Date End Date Taking? Authorizing Provider  acetaminophen (TYLENOL) 500 MG tablet Take 1,000 mg by mouth every 6 (six) hours as needed.    [provider]  capsicum (ZOSTRIX) 0.075 % topical cream Apply 1 application topically 2 (two) times daily as needed. 02/23/16   Alfonse Spruce, FNP  diclofenac (VOLTAREN) 75 MG EC tablet Take 1 tablet (75 mg total) by mouth 2 (two) times daily. 03/09/16   Truett Mainland, DO   ferrous sulfate 325 (65 FE) MG tablet Take 1 tablet (325 mg total) by mouth daily with breakfast. Patient not taking: Reported on 03/31/2016 03/04/16   Anyanwu, Sallyanne Havers, MD  fexofenadine (ALLEGRA ALLERGY) 180 MG tablet Take 1 tablet (180 mg total) by mouth daily. 02/23/16   Alfonse Spruce, FNP  fexofenadine-pseudoephedrine (ALLEGRA-D) 60-120 MG 12 hr tablet Take 1 tablet by mouth every 12 (twelve) hours. 07/07/16   Lysbeth Penner, FNP  lisinopril-hydrochlorothiazide (PRINZIDE,ZESTORETIC) 20-25 MG tablet Take 1 tablet by mouth daily. 02/23/16   Alfonse Spruce, FNP  megestrol (MEGACE) 40 MG tablet TAKE 1 TABLET BY MOUTH 4 TIMES A DAY 05/13/16   Donnamae Jude, MD  naproxen (NAPROSYN) 500 MG tablet Take 1 tablet (500 mg total) by mouth 2 (two) times daily with a meal. 07/07/16   Remell Giaimo, Orson Ape, FNP  predniSONE (DELTASONE) 20 MG tablet Two daily with food 06/10/16   Robyn Haber, MD   Meds Ordered and Administered this Visit  Medications - No data to display  BP 135/83 (BP Location: Left Arm)   Pulse 87   Temp 98.4 F (36.9 C) (Oral)   Resp 18   LMP 07/20/2015 (Approximate)   SpO2 100%  No data found.   Physical Exam  Constitutional: She appears well-developed and well-nourished.  HENT:  Head: Normocephalic and atraumatic.  Eyes: Conjunctivae and EOM are normal. Pupils are  equal, round, and reactive to light.  Neck: Normal range of motion. Neck supple.  Cardiovascular: Normal rate, regular rhythm and normal heart sounds.   Pulmonary/Chest: Effort normal and breath sounds normal.  Musculoskeletal: She exhibits tenderness.  Tenderness anterior chest wall and cervical paraspinous muscles.  Nursing note and vitals reviewed.   Urgent Care Course     Procedures (including critical care time)  Labs Review Labs Reviewed - No data to display  Imaging Review No results found.   Visual Acuity Review  Right Eye Distance:   Left Eye Distance:   Bilateral Distance:     Right Eye Near:   Left Eye Near:    Bilateral Near:         MDM   1. Cervicalgia   2. Costochondritis    Naprosyn 500mg  one po bid x 10 days #20      Lysbeth Penner, FNP 07/07/16 1356

## 2016-07-13 ENCOUNTER — Other Ambulatory Visit: Payer: Self-pay | Admitting: Family Medicine

## 2016-07-13 DIAGNOSIS — N938 Other specified abnormal uterine and vaginal bleeding: Secondary | ICD-10-CM

## 2016-07-13 MED FILL — MEGESTROL 40 MG TABLET: 40 | 7 days supply | Qty: 30 | Fill #0

## 2016-07-13 MED FILL — MEGESTROL 40 MG TABLET: 40 | 30 days supply | Qty: 120 | Fill #0

## 2016-07-28 ENCOUNTER — Emergency Department (HOSPITAL_COMMUNITY)
Admission: EM | Admit: 2016-07-28 | Discharge: 2016-07-28 | Disposition: A | Discharge status: Home / Self Care | Payer: Self-pay | Attending: Emergency Medicine | Admitting: Emergency Medicine

## 2016-07-28 ENCOUNTER — Encounter (HOSPITAL_COMMUNITY): Payer: Self-pay | Admitting: Emergency Medicine

## 2016-07-28 DIAGNOSIS — Z5321 Procedure and treatment not carried out due to patient leaving prior to being seen by health care provider: Secondary | ICD-10-CM | POA: Insufficient documentation

## 2016-07-28 NOTE — ED Notes (Signed)
Pt states she has not taken BP medicine for the past week because she finished it and didn't have money to get the refill.  States she didn't work for the last month because she has been sick.

## 2016-07-28 NOTE — ED Notes (Signed)
Pt states she is unable to wait to see doctor.  [facility]  Encouraged pt to stay and she declines due to current wait for treatment room.

## 2016-07-28 NOTE — ED Triage Notes (Addendum)
C/o swelling and pain to R foot since this afternoon.  Denies injury.  Denies sob.

## 2016-07-31 ENCOUNTER — Emergency Department (HOSPITAL_COMMUNITY)
Admission: EM | Admit: 2016-07-31 | Discharge: 2016-07-31 | Disposition: A | Discharge status: Home / Self Care | Payer: Self-pay | Attending: Emergency Medicine | Admitting: Emergency Medicine

## 2016-07-31 ENCOUNTER — Encounter (HOSPITAL_COMMUNITY): Payer: Self-pay | Admitting: Emergency Medicine

## 2016-07-31 ENCOUNTER — Emergency Department (HOSPITAL_COMMUNITY): Payer: Self-pay

## 2016-07-31 DIAGNOSIS — M25572 Pain in left ankle and joints of left foot: Secondary | ICD-10-CM | POA: Insufficient documentation

## 2016-07-31 DIAGNOSIS — M79672 Pain in left foot: Secondary | ICD-10-CM

## 2016-07-31 DIAGNOSIS — I10 Essential (primary) hypertension: Secondary | ICD-10-CM | POA: Insufficient documentation

## 2016-07-31 DIAGNOSIS — Z79899 Other long term (current) drug therapy: Secondary | ICD-10-CM | POA: Insufficient documentation

## 2016-07-31 NOTE — ED Triage Notes (Signed)
Pt c/o pain to bilateral feet.  Pt was triaged on Wednesday for R foot pain and left shortly after being triaged due to wait.  Stated she had been out of medication and [facility].  PT states pain is better today but now both feet are hurting.  States she got BP medication and has been taking it.  Denies injury.

## 2016-07-31 NOTE — Discharge Instructions (Signed)
Continue home medications.  Follow up with your primary care doctor as they recommend.  Return to ER for new or worsening symptoms, any additional concerns.

## 2016-07-31 NOTE — ED Notes (Signed)
Pt ambulating around room requesting water for PO home meds. RN made aware

## 2016-07-31 NOTE — ED Provider Notes (Signed)
[facility] DEPT Provider Note   CSN: [id number] Arrival date & time: 07/31/16  0016     History   Chief Complaint Chief Complaint  Patient presents with  . Foot Pain    HPI Cindy Garrison is a 46 y.o. female.  The history is provided by the patient and medical records. No language interpreter was used.   Cindy Garrison is a 46 y.o. female  with a PMH of HTN, arthritis, schizoaffective disorder who presents to the Emergency Department complaining of left ankle pain which began two weeks ago when she ran out of her diclofenac. She got her prescription refilled yesterday and pain improved once she took medication. She states that she came into the ER today because she wanted to ensure it was okay for her to go back to work and also wanted her blood pressure checked. She had been out of her lisinopril for 2 weeks as well, but got that refilled yesterday. No chest pain, shortness of breath, headaches, abdominal pain, calf pain, leg swelling, skin color changes or wounds. No known injury. Patient notes bilateral ankles will intermittently hurt for several months now which is why she was given diclofenac rx. Left ankle still hurts "just a little" but much better. No pain with movement. Aggravated by walking but able to walk without limp.    Past Medical History:  Diagnosis Date  . #[id number]   . Acne   . Back pain   . Essential hypertension   . Seasonal allergies     Patient Active Problem List   Diagnosis Date Noted  . Fibroid uterus 03/26/2015  . DUB (dysfunctional uterine bleeding) 03/26/2015  . Iron deficiency anemia due to chronic blood loss 11/22/2014  . Absolute anemia   . Syncope 09/16/2014  . Anemia 09/16/2014  . Menorrhagia 09/16/2014  . Schizoaffective disorder ([place]) 09/16/2014  . Syncope and collapse 09/16/2014  . Back pain   . Essential hypertension   . Allergic rhinitis 09/13/2006    Past Surgical History:  Procedure Laterality Date  . NO PAST  SURGERIES      OB History    Gravida Para Term Preterm AB Living   3 0 0 0 3 0   SAB TAB Ectopic Multiple Live Births   3 0 0 0         Home Medications    Prior to Admission medications   Medication Sig Start Date End Date Taking? Authorizing Provider  acetaminophen (TYLENOL) 500 MG tablet Take 1,000 mg by mouth every 6 (six) hours as needed.   Yes [provider]  capsicum (ZOSTRIX) 0.075 % topical cream Apply 1 application topically 2 (two) times daily as needed. 02/23/16  Yes Alfonse Spruce, FNP  diclofenac (VOLTAREN) 75 MG EC tablet Take 1 tablet (75 mg total) by mouth 2 (two) times daily. 03/09/16  Yes Truett Mainland, DO  fexofenadine-pseudoephedrine (ALLEGRA-D) 60-120 MG 12 hr tablet Take 1 tablet by mouth every 12 (twelve) hours. 07/07/16  Yes Lysbeth Penner, FNP  lisinopril-hydrochlorothiazide (PRINZIDE,ZESTORETIC) 20-25 MG tablet Take 1 tablet by mouth daily. 02/23/16  Yes Fredia Beets R, FNP  megestrol (MEGACE) 40 MG tablet TAKE 1 TABLET BY MOUTH 4 TIMES A DAY 05/13/16  Yes Donnamae Jude, MD  naproxen (NAPROSYN) 500 MG tablet Take 1 tablet (500 mg total) by mouth 2 (two) times daily with a meal. 07/07/16  Yes Oxford, Orson Ape, FNP  predniSONE (DELTASONE) 20 MG tablet Two daily with food Patient taking differently:  Take 20 mg by mouth 2 (two) times daily with a meal. Two daily with food 06/10/16  Yes Robyn Haber, MD  ferrous sulfate 325 (65 FE) MG tablet Take 1 tablet (325 mg total) by mouth daily with breakfast. Patient not taking: Reported on 03/31/2016 03/04/16   Anyanwu, Sallyanne Havers, MD  fexofenadine (ALLEGRA ALLERGY) 180 MG tablet Take 1 tablet (180 mg total) by mouth daily. Patient not taking: Reported on 07/31/2016 02/23/16   Alfonse Spruce, FNP  megestrol (MEGACE) 40 MG tablet TAKE 1 TABLET BY MOUTH 4 TIMES DAILY. Patient not taking: Reported on 07/31/2016 07/13/16   Donnamae Jude, MD    Family History No family history on file.  Social  History Social History  Substance Use Topics  . Smoking status: Never Smoker  . Smokeless tobacco: Never Used  . Alcohol use No     Allergies   Patient has no known allergies.   Review of Systems Review of Systems  Musculoskeletal: Positive for arthralgias.  All other systems reviewed and are negative.    Physical Exam Updated Vital Signs BP (!) 158/85 (BP Location: Right Arm)   Pulse 93   Temp 98.4 F (36.9 C) (Oral)   Resp 16   LMP 07/20/2015 (Approximate)   SpO2 99%   Physical Exam  Constitutional: She is oriented to person, place, and time. She appears well-developed and well-nourished. No distress.  HENT:  Head: Normocephalic and atraumatic.  Neck: Neck supple.  Cardiovascular: Normal rate, regular rhythm and normal heart sounds.   No murmur heard. Pulmonary/Chest: Effort normal and breath sounds normal. No respiratory distress.  Musculoskeletal:  Bilateral LE's with full ROM and 5/5 strength. No open wounds, erythema or swelling. No edema. Left lateral malleoli with mild tenderness. No other tenderness.  Neurological: She is alert and oriented to person, place, and time.  Skin: Skin is warm and dry.  Nursing note and vitals reviewed.    ED Treatments / Results  Labs (all labs ordered are listed, but only abnormal results are displayed) Labs Reviewed - No data to display  EKG  EKG Interpretation None       Radiology Dg Ankle Complete Left  Result Date: 07/31/2016 CLINICAL DATA:  46 year old female with left ankle pain. EXAM: LEFT ANKLE COMPLETE - 3+ VIEW COMPARISON:  None. FINDINGS: There is no evidence of fracture, dislocation, or joint effusion. There is no evidence of arthropathy or other focal bone abnormality. Soft tissues are unremarkable. IMPRESSION: Negative. Electronically Signed   By: Anner Crete M.D.   On: 07/31/2016 06:56    Procedures Procedures (including critical care time)  Medications Ordered in ED Medications - No data to  display   Initial Impression / Assessment and Plan / ED Course  I have reviewed the triage vital signs and the nursing notes.  Pertinent labs & imaging results that were available during my care of the patient were reviewed by me and considered in my medical decision making (see chart for details).    Cindy Garrison is a 46 y.o. female who presents to ED for left ankle pain x 2 weeks. She had been out of diclofenac for this same time period. She got her medication refilled yesterday and states symptoms improved with this medication. Exam reassuring. Plain film negative. Evaluation does not show pathology that would require ongoing emergent intervention or inpatient treatment. Continue home meds and follow up with PCP. All questions answered.   Final Clinical Impressions(s) / ED Diagnoses   Final  diagnoses:  Foot pain, left    New Prescriptions New Prescriptions   No medications on file     Kili Gracy, Ozella Almond, PA-C 07/31/16 3383    Sherwood Gambler, MD 07/31/16 [phone]

## 2016-08-30 ENCOUNTER — Encounter (HOSPITAL_COMMUNITY): Payer: Self-pay

## 2016-08-30 ENCOUNTER — Emergency Department (HOSPITAL_COMMUNITY)
Admission: EM | Admit: 2016-08-30 | Discharge: 2016-08-31 | Disposition: A | Discharge status: Home / Self Care | Payer: Self-pay | Attending: Emergency Medicine | Admitting: Emergency Medicine

## 2016-08-30 DIAGNOSIS — Z760 Encounter for issue of repeat prescription: Secondary | ICD-10-CM | POA: Insufficient documentation

## 2016-08-30 DIAGNOSIS — I10 Essential (primary) hypertension: Secondary | ICD-10-CM | POA: Insufficient documentation

## 2016-08-30 NOTE — ED Notes (Signed)
In traige this EMT attempted to take pt BP, Pt ripped BP cuff off and said its to much and she done want it done.

## 2016-08-30 NOTE — ED Triage Notes (Signed)
Pt requesting refill on BP medication.

## 2016-08-31 MED ORDER — LISINOPRIL-HYDROCHLOROTHIAZIDE 20-12.5 MG PO TABS: 1.0000 | ORAL_TABLET | Freq: Every day | ORAL | 0 refills | Status: DC

## 2016-08-31 MED ORDER — LISINOPRIL 10 MG PO TABS
10.0000 mg | ORAL_TABLET | Freq: Once | ORAL | Status: AC
  Administered 2016-08-31: 10 mg via ORAL
  Filled 2016-08-31: qty 1

## 2016-08-31 MED ORDER — NAPROXEN 375 MG PO TABS: 375.0000 mg | ORAL_TABLET | Freq: Two times a day (BID) | ORAL | 0 refills | Status: DC

## 2016-08-31 NOTE — ED Provider Notes (Signed)
[facility] DEPT Provider Note   CSN: [id number] Arrival date & time: 08/30/16  2200     History   Chief Complaint Chief Complaint  Patient presents with  . Medication Refill    HPI Cindy Garrison is a 46 y.o. female.  HPI  Patient presents for medication refill.  She is requesting refill for her BP meds.  She also is requesting refill of naproxen for leg/ankle pain No fever/vomiting No cp/sob No other pain complaints   Past Medical History:  Diagnosis Date  . #[id number]   . Acne   . Back pain   . Essential hypertension   . Seasonal allergies     Patient Active Problem List   Diagnosis Date Noted  . Fibroid uterus 03/26/2015  . DUB (dysfunctional uterine bleeding) 03/26/2015  . Iron deficiency anemia due to chronic blood loss 11/22/2014  . Absolute anemia   . Syncope 09/16/2014  . Anemia 09/16/2014  . Menorrhagia 09/16/2014  . Schizoaffective disorder ([place]) 09/16/2014  . Syncope and collapse 09/16/2014  . Back pain   . Essential hypertension   . Allergic rhinitis 09/13/2006    Past Surgical History:  Procedure Laterality Date  . NO PAST SURGERIES      OB History    Gravida Para Term Preterm AB Living   3 0 0 0 3 0   SAB TAB Ectopic Multiple Live Births   3 0 0 0         Home Medications    Prior to Admission medications   Medication Sig Start Date End Date Taking? Authorizing Provider  acetaminophen (TYLENOL) 500 MG tablet Take 1,000 mg by mouth every 6 (six) hours as needed.    [provider]  capsicum (ZOSTRIX) 0.075 % topical cream Apply 1 application topically 2 (two) times daily as needed. 02/23/16   Alfonse Spruce, FNP  ferrous sulfate 325 (65 FE) MG tablet Take 1 tablet (325 mg total) by mouth daily with breakfast. Patient not taking: Reported on 03/31/2016 03/04/16   Anyanwu, Sallyanne Havers, MD  fexofenadine (ALLEGRA ALLERGY) 180 MG tablet Take 1 tablet (180 mg total) by mouth daily. Patient not taking: Reported on 07/31/2016  02/23/16   Alfonse Spruce, FNP  fexofenadine-pseudoephedrine (ALLEGRA-D) 60-120 MG 12 hr tablet Take 1 tablet by mouth every 12 (twelve) hours. 07/07/16   Lysbeth Penner, FNP  lisinopril-hydrochlorothiazide (ZESTORETIC) 20-12.5 MG tablet Take 1 tablet by mouth daily. 08/31/16   Ripley Fraise, MD  megestrol (MEGACE) 40 MG tablet TAKE 1 TABLET BY MOUTH 4 TIMES A DAY 05/13/16   Donnamae Jude, MD  megestrol (MEGACE) 40 MG tablet TAKE 1 TABLET BY MOUTH 4 TIMES DAILY. Patient not taking: Reported on 07/31/2016 07/13/16   Donnamae Jude, MD  naproxen (NAPROSYN) 375 MG tablet Take 1 tablet (375 mg total) by mouth 2 (two) times daily. 08/31/16   Ripley Fraise, MD    Family History No family history on file.  Social History Social History  Substance Use Topics  . Smoking status: Never Smoker  . Smokeless tobacco: Never Used  . Alcohol use No     Allergies   Patient has no known allergies.   Review of Systems Review of Systems  Constitutional: Negative for fever.  Musculoskeletal: Negative for back pain.     Physical Exam Updated Vital Signs BP (!) 162/101 (BP Location: Right Arm)   Pulse 85   Temp 98.4 F (36.9 C) (Oral)   Resp 16   LMP 07/20/2015 (  Approximate)   SpO2 98%   Physical Exam CONSTITUTIONAL: Well developed/well nourished HEAD: Normocephalic/atraumatic EYES: EOMI ENMT: Mucous membranes moist NECK: supple no meningeal signs SPINE/BACK:entire spine nontender CV: S1/S2 noted, no murmurs/rubs/gallops noted LUNGS: Lungs are clear to auscultation bilaterally, no apparent distress ABDOMEN: soft, nontender  NEURO: Pt is awake/alert/appropriate, moves all extremitiesx4.  No facial droop.  No ataxia.  No focal weakness noted EXTREMITIES: pulses normal/equal, full ROM, no LE edema/tenderness, no erythema noted. SKIN: warm, color normal PSYCH: no abnormalities of mood noted, alert and oriented to situation   ED Treatments / Results  Labs (all labs ordered are  listed, but only abnormal results are displayed) Labs Reviewed - No data to display  EKG  EKG Interpretation None       Radiology No results found.  Procedures Procedures (including critical care time)  Medications Ordered in ED Medications  lisinopril (PRINIVIL,ZESTRIL) tablet 10 mg (10 mg Oral Given 08/31/16 0354)     Initial Impression / Assessment and Plan / ED Course  I have reviewed the triage vital signs and the nursing notes.   Pt here for med refill I advised I would like to check BMP to prior to restarting her BP meds She declines and wants to be discharged I agreed to prescribe a one week course of BP meds but she must f/u with [facility] within a week She understands this plan Pt speaks english without difficulty No other acute emergent process identified.   Final Clinical Impressions(s) / ED Diagnoses   Final diagnoses:  Medication refill  Essential hypertension    New Prescriptions Discharge Medication List as of 08/31/2016  3:36 AM    START taking these medications   Details  lisinopril-hydrochlorothiazide (ZESTORETIC) 20-12.5 MG tablet Take 1 tablet by mouth daily., Starting Tue 08/31/2016, Print         Ripley Fraise, MD 08/31/16 [phone]

## 2016-09-07 ENCOUNTER — Ambulatory Visit (HOSPITAL_COMMUNITY)
Admission: EM | Admit: 2016-09-07 | Discharge: 2016-09-07 | Disposition: A | Discharge status: Home / Self Care | Payer: Self-pay | Attending: Family Medicine | Admitting: Family Medicine

## 2016-09-07 ENCOUNTER — Encounter (HOSPITAL_COMMUNITY): Payer: Self-pay | Admitting: Emergency Medicine

## 2016-09-07 DIAGNOSIS — Z760 Encounter for issue of repeat prescription: Secondary | ICD-10-CM

## 2016-09-07 DIAGNOSIS — I10 Essential (primary) hypertension: Secondary | ICD-10-CM

## 2016-09-07 MED ORDER — LISINOPRIL-HYDROCHLOROTHIAZIDE 20-12.5 MG PO TABS: 1.0000 | ORAL_TABLET | Freq: Every day | ORAL | 1 refills | Status: DC

## 2016-09-07 MED ORDER — NAPROXEN 375 MG PO TABS: 375.0000 mg | ORAL_TABLET | Freq: Two times a day (BID) | ORAL | 0 refills | Status: DC

## 2016-09-07 NOTE — ED Triage Notes (Signed)
Lisinopril hctz 20-12.5mg  refill Naproxen 375 diclofenac75mg   And requests assistance with making an appt with doctor Moore interpreter phone

## 2016-09-07 NOTE — ED Provider Notes (Signed)
  [facility]   [id number] 09/07/16 Arrival Time: 6599  ASSESSMENT & PLAN:  1. Medication refill   2. Essential hypertension     Meds ordered this encounter  Medications  . naproxen (NAPROSYN) 375 MG tablet    Sig: Take 1 tablet (375 mg total) by mouth 2 (two) times daily with a meal.    Dispense:  30 tablet    Refill:  0  . lisinopril-hydrochlorothiazide (ZESTORETIC) 20-12.5 MG tablet    Sig: Take 1 tablet by mouth daily.    Dispense:  30 tablet    Refill:  1   Refilled as above. She is planning to go to [facility] to make an appt to establish care with PCP. She may f/u here as needed.  Reviewed expectations re: course of current medical issues. Questions answered. Outlined signs and symptoms indicating need for more acute intervention. Patient verbalized understanding. After Visit Summary given.   SUBJECTIVE:  Cindy Garrison is a 46 y.o. female who presents with request for medication refill of lisinopril/HCTZ and NSAID. Feeling well. Reports BP is controlled. Occasional Naproxyn use.  ROS: As per HPI.   OBJECTIVE:  Vitals:   09/07/16 1157  BP: 135/90  Pulse: (!) 109  Resp: 18  Temp: 98.8 F (37.1 C)  TempSrc: Oral  SpO2: 99%     General appearance: alert; no distress Lungs: clear to auscultation bilaterally Heart: regular rate and rhythm Skin: warm and dry Psychological:  alert and cooperative; normal mood and affect   No Known Allergies  PMHx, SurgHx, SocialHx, Medications, and Allergies were reviewed in the Visit Navigator and updated as appropriate.      Vanessa Kick, MD 09/07/16 1214

## 2016-09-21 ENCOUNTER — Other Ambulatory Visit: Payer: Self-pay | Admitting: Family Medicine

## 2016-10-15 MED FILL — MEGESTROL 40 MG TABLET: 40 | 30 days supply | Qty: 120 | Fill #1

## 2016-10-22 ENCOUNTER — Ambulatory Visit: Payer: Self-pay | Admitting: Family Medicine

## 2016-10-25 ENCOUNTER — Telehealth: Payer: Self-pay | Admitting: Family Medicine

## 2016-10-25 MED ORDER — LISINOPRIL-HYDROCHLOROTHIAZIDE 20-12.5 MG PO TABS
1.0000 | ORAL_TABLET | Freq: Every day | ORAL | 0 refills | Status: DC
Start: 2016-10-25 — End: 2016-11-04

## 2016-10-25 MED FILL — LISINOPRIL-HCTZ 20-12.5 MG: 20-12.5 | 30 days supply | Qty: 30 | Fill #0

## 2016-10-25 NOTE — Telephone Encounter (Signed)
Pt came to the office to get a refill for lisinopril-hydrochlorothiazide (ZESTORETIC) 20-12.5 MG tablet   Please follow up

## 2016-10-25 NOTE — Telephone Encounter (Signed)
Refilled x 30 days - patient must have office visit or no further refills will be given.

## 2016-10-27 ENCOUNTER — Ambulatory Visit: Payer: Self-pay | Admitting: Family Medicine

## 2016-11-03 ENCOUNTER — Encounter (HOSPITAL_COMMUNITY): Payer: Self-pay | Admitting: Emergency Medicine

## 2016-11-03 ENCOUNTER — Ambulatory Visit (HOSPITAL_COMMUNITY)
Admission: EM | Admit: 2016-11-03 | Discharge: 2016-11-03 | Disposition: A | Discharge status: Home / Self Care | Payer: Self-pay | Attending: Family Medicine | Admitting: Family Medicine

## 2016-11-03 DIAGNOSIS — R519 Headache, unspecified: Secondary | ICD-10-CM

## 2016-11-03 DIAGNOSIS — R51 Headache: Secondary | ICD-10-CM

## 2016-11-03 NOTE — Discharge Instructions (Signed)
Please take Tylenol three times a day. Take Naproxen as previously prescribed, take with food. Follow up with your primary care doctor tomorrow as scheduled for recheck and follow up.

## 2016-11-03 NOTE — ED Provider Notes (Signed)
[facility]    CSN: [id number] Arrival date & time: 11/03/16  1434     History   Chief Complaint Chief Complaint  Patient presents with  . Headache    HPI SABRINIA PRIEN is a 46 y.o. female. She presents with complaints of left posterior headache which is mild in severity which started yesterday. It has not worsened. It is not affected by light sound or movement. It is constant. She denies nausea or vomiting. She took tylenol at 0500 this morning which helped, she has not taken since. She takes naproxen. Denies dizziness, vision changes or neck pain. No head injury. She is scheduled with her PCP tomorrow, as she had missed her appointment today.   HPI  Past Medical History:  Diagnosis Date  . #[id number]   . Acne   . Back pain   . Essential hypertension   . Seasonal allergies     Patient Active Problem List   Diagnosis Date Noted  . Fibroid uterus 03/26/2015  . DUB (dysfunctional uterine bleeding) 03/26/2015  . Iron deficiency anemia due to chronic blood loss 11/22/2014  . Absolute anemia   . Syncope 09/16/2014  . Anemia 09/16/2014  . Menorrhagia 09/16/2014  . Schizoaffective disorder ([place]) 09/16/2014  . Syncope and collapse 09/16/2014  . Back pain   . Essential hypertension   . Allergic rhinitis 09/13/2006    Past Surgical History:  Procedure Laterality Date  . NO PAST SURGERIES      OB History    Gravida Para Term Preterm AB Living   3 0 0 0 3 0   SAB TAB Ectopic Multiple Live Births   3 0 0 0         Home Medications    Prior to Admission medications   Medication Sig Start Date End Date Taking? Authorizing Provider  diclofenac (VOLTAREN) 75 MG EC tablet TAKE 1 TABLET TWICE A DAY 09/21/16  Yes Donnamae Jude, MD  lisinopril-hydrochlorothiazide (ZESTORETIC) 20-12.5 MG tablet Take 1 tablet by mouth daily. 10/25/16  Yes Tresa Garter, MD  megestrol (MEGACE) 40 MG tablet TAKE 1 TABLET BY MOUTH 4 TIMES A DAY 05/13/16  Yes Donnamae Jude,  MD  naproxen (NAPROSYN) 375 MG tablet Take 1 tablet (375 mg total) by mouth 2 (two) times daily with a meal. 09/07/16  Yes Vanessa Kick, MD  acetaminophen (TYLENOL) 500 MG tablet Take 1,000 mg by mouth every 6 (six) hours as needed.    [provider]  capsicum (ZOSTRIX) 0.075 % topical cream Apply 1 application topically 2 (two) times daily as needed. 02/23/16   Alfonse Spruce, FNP  ferrous sulfate 325 (65 FE) MG tablet Take 1 tablet (325 mg total) by mouth daily with breakfast. Patient not taking: Reported on 03/31/2016 03/04/16   Anyanwu, Sallyanne Havers, MD  fexofenadine (ALLEGRA ALLERGY) 180 MG tablet Take 1 tablet (180 mg total) by mouth daily. Patient not taking: Reported on 07/31/2016 02/23/16   Alfonse Spruce, FNP  fexofenadine-pseudoephedrine (ALLEGRA-D) 60-120 MG 12 hr tablet Take 1 tablet by mouth every 12 (twelve) hours. 07/07/16   Lysbeth Penner, FNP  megestrol (MEGACE) 40 MG tablet TAKE 1 TABLET BY MOUTH 4 TIMES DAILY. Patient not taking: Reported on 07/31/2016 07/13/16   Donnamae Jude, MD    Family History No family history on file.  Social History Social History  Substance Use Topics  . Smoking status: Never Smoker  . Smokeless tobacco: Never Used  . Alcohol use No  Allergies   Patient has no known allergies.   Review of Systems Review of Systems  Constitutional: Negative for fatigue and fever.  Gastrointestinal: Negative for diarrhea and nausea.  Musculoskeletal: Negative for neck pain.  Neurological: Positive for headaches. Negative for dizziness, seizures, syncope, facial asymmetry, speech difficulty, weakness and numbness.     Physical Exam Triage Vital Signs ED Triage Vitals  Enc Vitals Group     BP 11/03/16 1517 (!) 137/94     Pulse Rate 11/03/16 1517 95     Resp 11/03/16 1517 18     Temp 11/03/16 1517 98.2 F (36.8 C)     Temp src --      SpO2 11/03/16 1517 99 %     Weight --      Height --      Head Circumference --      Peak  Flow --      Pain Score 11/03/16 1519 8     Pain Loc --      Pain Edu? --      Excl. in [place]? --    No data found.   Updated Vital Signs BP (!) 137/94   Pulse 95   Temp 98.2 F (36.8 C)   Resp 18   LMP 07/20/2015 (Approximate)   SpO2 99%   Visual Acuity Right Eye Distance:   Left Eye Distance:   Bilateral Distance:    Right Eye Near:   Left Eye Near:    Bilateral Near:     Physical Exam  Constitutional: She is oriented to person, place, and time. She appears well-developed and well-nourished.  HENT:  Head: Normocephalic and atraumatic.  Right Ear: External ear normal.  Left Ear: External ear normal.  Nose: Nose normal.  Mouth/Throat: Oropharynx is clear and moist.  Eyes: Pupils are equal, round, and reactive to light.  Neck: Normal range of motion.  Cardiovascular: Normal rate, regular rhythm, normal heart sounds and intact distal pulses.   Pulmonary/Chest: Effort normal and breath sounds normal.  Lymphadenopathy:    She has no cervical adenopathy.  Neurological: She is alert and oriented to person, place, and time. She has normal strength. No cranial nerve deficit or sensory deficit.  Vitals reviewed.    UC Treatments / Results  Labs (all labs ordered are listed, but only abnormal results are displayed) Labs Reviewed - No data to display  EKG  EKG Interpretation None       Radiology No results found.  Procedures Procedures (including critical care time)  Medications Ordered in UC Medications - No data to display   Initial Impression / Assessment and Plan / UC Course  I have reviewed the triage vital signs and the nursing notes.  Pertinent labs & imaging results that were available during my care of the patient were reviewed by me and considered in my medical decision making (see chart for details).     Patient states that she currently has only mild headache which has not caused further systemic symptoms. She has not taken tylenol since 0500  which had previously helped. She is already taking naproxen, no further NSAID provided at this time. Recommended rest and continued use of tylenol. Patient agreeable with this plan. To follow up with PCP tomorrow as already scheduled.   Final Clinical Impressions(s) / UC Diagnoses   Final diagnoses:  Acute nonintractable headache, unspecified headache type    New Prescriptions Discharge Medication List as of 11/03/2016  4:02 PM  Controlled Substance Prescriptions [place] Controlled Substance Registry consulted? Not Applicable   Zigmund Gottron, NP 11/03/16 1702

## 2016-11-03 NOTE — ED Triage Notes (Signed)
Pt c/o headache since yesterday. No neuro deficits. Ambulatory with steady gait. States sometimes she feels like she wants to vomit.

## 2016-11-04 ENCOUNTER — Encounter: Payer: Self-pay | Admitting: Family Medicine

## 2016-11-04 ENCOUNTER — Ambulatory Visit: Payer: Self-pay | Attending: Family Medicine | Admitting: Family Medicine

## 2016-11-04 VITALS — BP 167/93 | HR 84 | Temp 98.3°F | Resp 18 | Ht 61.0 in | Wt 148.0 lb

## 2016-11-04 DIAGNOSIS — G8929 Other chronic pain: Secondary | ICD-10-CM

## 2016-11-04 DIAGNOSIS — Z760 Encounter for issue of repeat prescription: Secondary | ICD-10-CM | POA: Insufficient documentation

## 2016-11-04 DIAGNOSIS — D509 Iron deficiency anemia, unspecified: Secondary | ICD-10-CM

## 2016-11-04 DIAGNOSIS — I10 Essential (primary) hypertension: Secondary | ICD-10-CM | POA: Insufficient documentation

## 2016-11-04 DIAGNOSIS — F259 Schizoaffective disorder, unspecified: Secondary | ICD-10-CM | POA: Insufficient documentation

## 2016-11-04 MED ORDER — LISINOPRIL-HYDROCHLOROTHIAZIDE 20-12.5 MG PO TABS: 1.0000 | ORAL_TABLET | Freq: Every day | ORAL | 0 refills | Status: DC

## 2016-11-04 MED ORDER — DICLOFENAC SODIUM 75 MG PO TBEC: 75.0000 mg | DELAYED_RELEASE_TABLET | Freq: Two times a day (BID) | ORAL | 6 refills | Status: DC

## 2016-11-04 NOTE — Progress Notes (Signed)
Patient ID: Cindy Garrison, female    DOB: 24-Mar-1970, 46 y.o.   MRN: [id number]  PCP: Alfonse Spruce, FNP  Chief Complaint  Patient presents with  . Medication Refill    Subjective:  HPI Cindy Garrison is a 46 y.o. female presents for medication refill of antihypertensive medication. She reports taking medication today. She denies headaches or shortness of breath, or chest pain. Reports no home monitoring of blood pressure. Efforts made to adhere to low sodium diet. She suffers from chronic musculoskeletal pain and achieves relief with daily doses of diclofenac. She is a non-smoker and reports to routine exercise. Current Body mass index is 27.96 kg/m. Denies any other complaints today. Social History   Social History  . Marital status: Single    Spouse name: N/A  . Number of children: N/A  . Years of education: N/A   Occupational History  . Not on file.   Social History Main Topics  . Smoking status: Never Smoker  . Smokeless tobacco: Never Used  . Alcohol use No  . Drug use: No  . Sexual activity: Not on file     Comment: monogamous female partner for 5 years (2010)   Other Topics Concern  . Not on file   Social History Narrative  . No narrative on file   History reviewed. No pertinent family history. Review of Systems See HPI  Patient Active Problem List   Diagnosis Date Noted  . Fibroid uterus 03/26/2015  . DUB (dysfunctional uterine bleeding) 03/26/2015  . Iron deficiency anemia due to chronic blood loss 11/22/2014  . Absolute anemia   . Syncope 09/16/2014  . Anemia 09/16/2014  . Menorrhagia 09/16/2014  . Schizoaffective disorder ([place]) 09/16/2014  . Syncope and collapse 09/16/2014  . Back pain   . Essential hypertension   . Allergic rhinitis 09/13/2006    No Known Allergies  Prior to Admission medications   Medication Sig Start Date End Date Taking? Authorizing Provider  acetaminophen (TYLENOL) 500 MG tablet Take 1,000 mg by mouth every  6 (six) hours as needed.    [provider]  capsicum (ZOSTRIX) 0.075 % topical cream Apply 1 application topically 2 (two) times daily as needed. 02/23/16   Alfonse Spruce, FNP  diclofenac (VOLTAREN) 75 MG EC tablet TAKE 1 TABLET TWICE A DAY 09/21/16   Donnamae Jude, MD  ferrous sulfate 325 (65 FE) MG tablet Take 1 tablet (325 mg total) by mouth daily with breakfast. Patient not taking: Reported on 03/31/2016 03/04/16   Anyanwu, Sallyanne Havers, MD  fexofenadine (ALLEGRA ALLERGY) 180 MG tablet Take 1 tablet (180 mg total) by mouth daily. Patient not taking: Reported on 07/31/2016 02/23/16   Alfonse Spruce, FNP  fexofenadine-pseudoephedrine (ALLEGRA-D) 60-120 MG 12 hr tablet Take 1 tablet by mouth every 12 (twelve) hours. 07/07/16   Lysbeth Penner, FNP  lisinopril-hydrochlorothiazide (ZESTORETIC) 20-12.5 MG tablet Take 1 tablet by mouth daily. 10/25/16   Tresa Garter, MD  megestrol (MEGACE) 40 MG tablet TAKE 1 TABLET BY MOUTH 4 TIMES A DAY 05/13/16   Donnamae Jude, MD  megestrol (MEGACE) 40 MG tablet TAKE 1 TABLET BY MOUTH 4 TIMES DAILY. Patient not taking: Reported on 07/31/2016 07/13/16   Donnamae Jude, MD  naproxen (NAPROSYN) 375 MG tablet Take 1 tablet (375 mg total) by mouth 2 (two) times daily with a meal. 09/07/16   Vanessa Kick, MD    Past Medical, Surgical Family and Social History reviewed and updated.  Objective:   Today's Vitals   11/04/16 1545  BP: 140/81  Pulse: 84  Resp: 18  Temp: 98.3 F (36.8 C)  TempSrc: Oral  SpO2: 100%  Weight: 148 lb (67.1 kg)  Height: 5\' 1"  (1.549 m)  PainSc: 0-No pain    Wt Readings from Last 3 Encounters:  03/31/16 155 lb (70.3 kg)  02/23/16 158 lb (71.7 kg)  01/22/16 154 lb (69.9 kg)    Physical Exam  Constitutional: She is oriented to person, place, and time. She appears well-developed and well-nourished.  HENT:  Head: Normocephalic and atraumatic.  Eyes: Pupils are equal, round, and reactive to light. Conjunctivae  and EOM are normal.  Neck: Normal range of motion.  Cardiovascular: Normal rate, regular rhythm, normal heart sounds and intact distal pulses.   Pulmonary/Chest: Effort normal and breath sounds normal.  Musculoskeletal: Normal range of motion.  Neurological: She is alert and oriented to person, place, and time.  Skin: Skin is warm and dry.  Psychiatric: She has a normal mood and affect. Her behavior is normal. Judgment and thought content normal.   Assessment & Plan:  1. Essential hypertension- uncontrolled today. Will continue regimen today. Return for BP check in 2 weeks.  2. Iron deficiency anemia, unspecified iron deficiency anemia type, checking CBC.  3. Other chronic pain-Continue diclofenac twice daily as needed for pain. Education that diclofenac is a NSAID and should not be taken with other medications such as ibuprofen or naproxen.   Meds ordered this encounter  Medications  . diclofenac (VOLTAREN) 75 MG EC tablet    Sig: Take 1 tablet (75 mg total) by mouth 2 (two) times daily.    Dispense:  60 tablet    Refill:  6    Order Specific Question:   Supervising Provider    Answer:   Tresa Garter [id number]  . lisinopril-hydrochlorothiazide (ZESTORETIC) 20-12.5 MG tablet    Sig: Take 1 tablet by mouth daily.    Dispense:  30 tablet    Refill:  0    Must have office visit for refills - last fill    Order Specific Question:   Supervising Provider    Answer:   Tresa Garter [[id number]]    Orders Placed This Encounter  Procedures  . Basic metabolic panel  . CBC with Differential    RTC: 3 month follow-up with PCP.   Carroll Sage. Kenton Kingfisher, MSN, [facility], [place], [place] [id number] [phone]

## 2016-11-05 LAB — CBC WITH DIFFERENTIAL/PLATELET
BASOS: 0 %
Basophils Absolute: 0 10*3/uL (ref 0.0–0.2)
EOS (ABSOLUTE): 0.1 10*3/uL (ref 0.0–0.4)
EOS: 2 %
HEMATOCRIT: 38.1 % (ref 34.0–46.6)
HEMOGLOBIN: 12.9 g/dL (ref 11.1–15.9)
IMMATURE GRANS (ABS): 0 10*3/uL (ref 0.0–0.1)
Immature Granulocytes: 0 %
LYMPHS: 34 %
Lymphocytes Absolute: 2.5 10*3/uL (ref 0.7–3.1)
MCH: 25.7 pg — AB (ref 26.6–33.0)
MCHC: 33.9 g/dL (ref 31.5–35.7)
MCV: 76 fL — AB (ref 79–97)
MONOCYTES: 9 %
Monocytes Absolute: 0.7 10*3/uL (ref 0.1–0.9)
NEUTROS ABS: 4 10*3/uL (ref 1.4–7.0)
Neutrophils: 55 %
Platelets: 306 10*3/uL (ref 150–379)
RBC: 5.01 x10E6/uL (ref 3.77–5.28)
RDW: 15.2 % (ref 12.3–15.4)
WBC: 7.3 10*3/uL (ref 3.4–10.8)

## 2016-11-05 LAB — BASIC METABOLIC PANEL
BUN/Creatinine Ratio: 11 (ref 9–23)
BUN: 7 mg/dL (ref 6–24)
CALCIUM: 9.3 mg/dL (ref 8.7–10.2)
CO2: 23 mmol/L (ref 20–29)
Chloride: 104 mmol/L (ref 96–106)
Creatinine, Ser: 0.62 mg/dL (ref 0.57–1.00)
GFR, EST AFRICAN AMERICAN: 123 mL/min/{1.73_m2} (ref >59)
GFR, EST NON AFRICAN AMERICAN: 107 mL/min/{1.73_m2} (ref >59)
Glucose: 85 mg/dL (ref 65–99)
Potassium: 4.6 mmol/L (ref 3.5–5.2)
Sodium: 140 mmol/L (ref 134–144)

## 2016-11-18 ENCOUNTER — Encounter: Payer: Self-pay | Admitting: Pharmacist

## 2016-11-18 NOTE — Progress Notes (Deleted)
   S:    Patient arrives ***.    Presents to the clinic for hypertension evaluation. Patient was referred on 11/04/16.  Patient was last seen by Primary Care Provider on 11/04/16.   Patient {Actions; denies-reports:120008} adherence with medications.  Current BP Medications include:  lisinopril-hydrochlorothiazide 20-12.5 MG tablet   Antihypertensives tried in the past include: ***  Dietary habits include:   O:   Last 3 Office BP readings: BP Readings from Last 3 Encounters:  11/04/16 (!) 167/93  11/03/16 (!) 137/94  09/07/16 135/90    BMET    Component Value Date/Time   NA 140 11/04/2016 1623   K 4.6 11/04/2016 1623   CL 104 11/04/2016 1623   CO2 23 11/04/2016 1623   GLUCOSE 85 11/04/2016 1623   GLUCOSE 93 02/23/2016 0924   BUN 7 11/04/2016 1623   CREATININE 0.62 11/04/2016 1623   CREATININE 0.66 02/23/2016 0924   CALCIUM 9.3 11/04/2016 1623   GFRNONAA 107 11/04/2016 1623   GFRNONAA >89 02/23/2016 0924   GFRAA 123 11/04/2016 1623   GFRAA >89 02/23/2016 0924    A/P: Hypertension longstanding currently *** on current medications.  {Meds adjust:18428} ***.   Results reviewed and written information provided.   Total time in face-to-face counseling *** minutes.   F/U Clinic Visit with Dr. Marland Kitchen  Patient seen with [place], PharmD Candidate

## 2016-11-22 MED FILL — MEGESTROL 40 MG TABLET: 40 | 30 days supply | Qty: 120 | Fill #2

## 2016-11-22 MED FILL — LISINOPRIL-HCTZ 20-12.5 MG: 20-12.5 | 30 days supply | Qty: 30 | Fill #0

## 2016-11-23 ENCOUNTER — Encounter: Payer: Self-pay | Admitting: Pharmacist

## 2016-12-15 ENCOUNTER — Telehealth: Payer: Self-pay

## 2016-12-15 DIAGNOSIS — N938 Other specified abnormal uterine and vaginal bleeding: Secondary | ICD-10-CM

## 2016-12-15 MED ORDER — MEGESTROL ACETATE 40 MG PO TABS: 40.0000 mg | ORAL_TABLET | Freq: Four times a day (QID) | ORAL | 6 refills | Status: DC

## 2016-12-15 NOTE — Telephone Encounter (Signed)
Pt came to the front office requesting a refill on her Megace.  Notified Dr. Ihor Dow who agree to refill pt's Megace until her one year follow up in March 2019.  Pt requested to have her Rx printed so that she can take it to her pharmacy.  Megace prescription reordered and given to pt.  Pt informed that in order to get future refills on her Megace she will need to schedule a f/u appt in April 2019 with Dr. Kennon Rounds.  Pt stated understanding.

## 2016-12-17 ENCOUNTER — Ambulatory Visit: Payer: Self-pay | Attending: Family Medicine | Admitting: Family Medicine

## 2016-12-17 ENCOUNTER — Other Ambulatory Visit: Payer: Self-pay

## 2016-12-17 ENCOUNTER — Encounter: Payer: Self-pay | Admitting: Family Medicine

## 2016-12-17 VITALS — BP 140/80 | HR 80 | Temp 99.4°F | Resp 16 | Wt 150.8 lb

## 2016-12-17 DIAGNOSIS — I10 Essential (primary) hypertension: Secondary | ICD-10-CM | POA: Insufficient documentation

## 2016-12-17 DIAGNOSIS — Z760 Encounter for issue of repeat prescription: Secondary | ICD-10-CM | POA: Insufficient documentation

## 2016-12-17 DIAGNOSIS — Z79899 Other long term (current) drug therapy: Secondary | ICD-10-CM | POA: Insufficient documentation

## 2016-12-17 DIAGNOSIS — M2550 Pain in unspecified joint: Secondary | ICD-10-CM | POA: Insufficient documentation

## 2016-12-17 DIAGNOSIS — G8929 Other chronic pain: Secondary | ICD-10-CM | POA: Insufficient documentation

## 2016-12-17 DIAGNOSIS — M1990 Unspecified osteoarthritis, unspecified site: Secondary | ICD-10-CM | POA: Insufficient documentation

## 2016-12-17 MED ORDER — DICLOFENAC SODIUM 75 MG PO TBEC: 75.0000 mg | DELAYED_RELEASE_TABLET | Freq: Two times a day (BID) | ORAL | 2 refills | Status: DC | PRN

## 2016-12-17 MED ORDER — CLONIDINE HCL 0.2 MG PO TABS
0.2000 mg | ORAL_TABLET | Freq: Once | ORAL | Status: AC
  Administered 2016-12-17: 0.2 mg via ORAL

## 2016-12-17 MED ORDER — LISINOPRIL-HYDROCHLOROTHIAZIDE 20-25 MG PO TABS
1.0000 | ORAL_TABLET | Freq: Every day | ORAL | 3 refills | Status: DC
Start: 2016-12-17 — End: 2016-12-31

## 2016-12-17 MED FILL — LISINOPRIL-HCTZ 20-12.5 MG: 20-12.5 | 30 days supply | Qty: 30 | Fill #0

## 2016-12-17 MED FILL — MEGESTROL 40 MG TABLET: 40 | 30 days supply | Qty: 120 | Fill #0

## 2016-12-17 NOTE — Patient Instructions (Signed)
Managing Your Hypertension Hypertension is commonly called high blood pressure. This is when the force of your blood pressing against the walls of your arteries is too strong. Arteries are blood vessels that carry blood from your heart throughout your body. Hypertension forces the heart to work harder to pump blood, and may cause the arteries to become narrow or stiff. Having untreated or uncontrolled hypertension can cause heart attack, stroke, kidney disease, and other problems. What are blood pressure readings? A blood pressure reading consists of a higher number over a lower number. Ideally, your blood pressure should be below 120/80. The first ("top") number is called the systolic pressure. It is a measure of the pressure in your arteries as your heart beats. The second ("bottom") number is called the diastolic pressure. It is a measure of the pressure in your arteries as the heart relaxes. What does my blood pressure reading mean? Blood pressure is classified into four stages. Based on your blood pressure reading, your health care provider may use the following stages to determine what type of treatment you need, if any. Systolic pressure and diastolic pressure are measured in a unit called mm Hg. Normal  Systolic pressure: below 120.  Diastolic pressure: below 80. Elevated  Systolic pressure: 120-129.  Diastolic pressure: below 80. Hypertension stage 1  Systolic pressure: 130-139.  Diastolic pressure: 80-89. Hypertension stage 2  Systolic pressure: 140 or above.  Diastolic pressure: 90 or above. What health risks are associated with hypertension? Managing your hypertension is an important responsibility. Uncontrolled hypertension can lead to:  A heart attack.  A stroke.  A weakened blood vessel (aneurysm).  Heart failure.  Kidney damage.  Eye damage.  Metabolic syndrome.  Memory and concentration problems.  What changes can I make to manage my  hypertension? Hypertension can be managed by making lifestyle changes and possibly by taking medicines. Your health care provider will help you make a plan to bring your blood pressure within a normal range. Eating and drinking  Eat a diet that is high in fiber and potassium, and low in salt (sodium), added sugar, and fat. An example eating plan is called the DASH (Dietary Approaches to Stop Hypertension) diet. To eat this way: ? Eat plenty of fresh fruits and vegetables. Try to fill half of your plate at each meal with fruits and vegetables. ? Eat whole grains, such as whole wheat pasta, brown rice, or whole grain bread. Fill about one quarter of your plate with whole grains. ? Eat low-fat diary products. ? Avoid fatty cuts of meat, processed or cured meats, and poultry with skin. Fill about one quarter of your plate with lean proteins such as fish, chicken without skin, beans, eggs, and tofu. ? Avoid premade and processed foods. These tend to be higher in sodium, added sugar, and fat.  Reduce your daily sodium intake. Most people with hypertension should eat less than 1,500 mg of sodium a day.  Limit alcohol intake to no more than 1 drink a day for nonpregnant women and 2 drinks a day for men. One drink equals 12 oz of beer, 5 oz of wine, or 1 oz of hard liquor. Lifestyle  Work with your health care provider to maintain a healthy body weight, or to lose weight. Ask what an ideal weight is for you.  Get at least 30 minutes of exercise that causes your heart to beat faster (aerobic exercise) most days of the week. Activities may include walking, swimming, or biking.  Include exercise   to strengthen your muscles (resistance exercise), such as weight lifting, as part of your weekly exercise routine. Try to do these types of exercises for 30 minutes at least 3 days a week.  Do not use any products that contain nicotine or tobacco, such as cigarettes and e-cigarettes. If you need help quitting, ask  your health care provider.  Control any long-term (chronic) conditions you have, such as high cholesterol or diabetes. Monitoring  Monitor your blood pressure at home as told by your health care provider. Your personal target blood pressure may vary depending on your medical conditions, your age, and other factors.  Have your blood pressure checked regularly, as often as told by your health care provider. Working with your health care provider  Review all the medicines you take with your health care provider because there may be side effects or interactions.  Talk with your health care provider about your diet, exercise habits, and other lifestyle factors that may be contributing to hypertension.  Visit your health care provider regularly. Your health care provider can help you create and adjust your plan for managing hypertension. Will I need medicine to control my blood pressure? Your health care provider may prescribe medicine if lifestyle changes are not enough to get your blood pressure under control, and if:  Your systolic blood pressure is 130 or higher.  Your diastolic blood pressure is 80 or higher.  Take medicines only as told by your health care provider. Follow the directions carefully. Blood pressure medicines must be taken as prescribed. The medicine does not work as well when you skip doses. Skipping doses also puts you at risk for problems. Contact a health care provider if:  You think you are having a reaction to medicines you have taken.  You have repeated (recurrent) headaches.  You feel dizzy.  You have swelling in your ankles.  You have trouble with your vision. Get help right away if:  You develop a severe headache or confusion.  You have unusual weakness or numbness, or you feel faint.  You have severe pain in your chest or abdomen.  You vomit repeatedly.  You have trouble breathing. Summary  Hypertension is when the force of blood pumping through  your arteries is too strong. If this condition is not controlled, it may put you at risk for serious complications.  Your personal target blood pressure may vary depending on your medical conditions, your age, and other factors. For most people, a normal blood pressure is less than 120/80.  Hypertension is managed by lifestyle changes, medicines, or both. Lifestyle changes include weight loss, eating a healthy, low-sodium diet, exercising more, and limiting alcohol. This information is not intended to replace advice given to you by your health care provider. Make sure you discuss any questions you have with your health care provider. Document Released: 09/29/2011 Document Revised: 12/03/2015 Document Reviewed: 12/03/2015 Elsevier Interactive Patient Education  2018 Elsevier Inc.  

## 2016-12-17 NOTE — Progress Notes (Signed)
Subjective:  Patient ID: Cindy Garrison, female    DOB: 1970/02/17  Age: 46 y.o. MRN: [id number]  CC: Hypertension and Medication Refill   HPI Cindy Garrison presents for hypertension and chronic joint pain. Hypertension:She does not check BP at home. She has been without her BP medication for 2 weeks. Cardiac symptoms none. Patient denies chest pain, dyspnea, lower extremity edema and syncope. Cardiovascular risk factors: hypertension and sedentary lifestyle. Use of agents associated with hypertension: NSAIDS. History of target organ damage: none. History of chronic joint pain for 5 years. She denies any history of injury. She requests diclofenac refill.   Outpatient Medications Prior to Visit  Medication Sig Dispense Refill  . acetaminophen (TYLENOL) 500 MG tablet Take 1,000 mg by mouth every 6 (six) hours as needed.    . megestrol (MEGACE) 40 MG tablet Take 1 tablet (40 mg total) by mouth 4 (four) times daily. 120 tablet 6  . naproxen (NAPROSYN) 375 MG tablet Take 1 tablet (375 mg total) by mouth 2 (two) times daily with a meal. 30 tablet 0  . diclofenac (VOLTAREN) 75 MG EC tablet Take 1 tablet (75 mg total) by mouth 2 (two) times daily. 60 tablet 6  . lisinopril-hydrochlorothiazide (ZESTORETIC) 20-12.5 MG tablet Take 1 tablet by mouth daily. 30 tablet 0  . capsicum (ZOSTRIX) 0.075 % topical cream Apply 1 application topically 2 (two) times daily as needed. (Patient not taking: Reported on 12/17/2016) 56 g 1  . ferrous sulfate 325 (65 FE) MG tablet Take 1 tablet (325 mg total) by mouth daily with breakfast. (Patient not taking: Reported on 03/31/2016) 30 tablet 3  . fexofenadine-pseudoephedrine (ALLEGRA-D) 60-120 MG 12 hr tablet Take 1 tablet by mouth every 12 (twelve) hours. (Patient not taking: Reported on 12/17/2016) 60 tablet 3   No facility-administered medications prior to visit.     ROS Review of Systems  Constitutional: Negative.   Eyes: Negative.   Respiratory:  Negative.   Cardiovascular: Negative.   Musculoskeletal: Positive for arthralgias (chronic).  Psychiatric/Behavioral:       History of schizoaffective disorder    Objective:    BP/Weight 12/17/2016 11/04/2016 51/02/5850  Systolic BP 778 242 353  Diastolic BP 80 93 94  Wt. (Lbs) 150.8 148 -  BMI 28.49 27.96 -     Physical Exam  Constitutional: She appears well-developed and well-nourished.  Eyes: Conjunctivae are normal. Pupils are equal, round, and reactive to light.  Cardiovascular: Normal rate, regular rhythm, normal heart sounds and intact distal pulses.  Pulmonary/Chest: Effort normal and breath sounds normal.  Abdominal: Soft. Bowel sounds are normal.  Musculoskeletal: Normal range of motion.  Skin: Skin is warm and dry.  Psychiatric: Her speech is normal. Her affect is blunt. She is agitated.  Nursing note and vitals reviewed.   Assessment & Plan:   1. Essential hypertension Dose of Prinzide increase. Schedule BP recheck in 2 weeks with nurse. If BP is greater than 90/60 (MAP 65 or greater) but not less than 130/80 may add amlodipine 5 mg daily and recheck in another 2 weeks.  - cloNIDine (CATAPRES) tablet 0.2 mg - Lipid Panel - lisinopril-hydrochlorothiazide (PRINZIDE,ZESTORETIC) 20-25 MG tablet; Take 1 tablet by mouth daily.  Dispense: 30 tablet; Refill: 3  2. Arthritis  - diclofenac (VOLTAREN) 75 MG EC tablet; Take 1 tablet (75 mg total) by mouth 2 (two) times daily as needed for moderate pain. Take with food.  Dispense: 60 tablet; Refill: 2      Follow-up: Return  in about 2 weeks (around 12/31/2016) for BP check with [place].   Alfonse Spruce FNP

## 2016-12-17 NOTE — Progress Notes (Signed)
Medication RF  Out of meds x 2 weeks

## 2016-12-18 LAB — LIPID PANEL
Chol/HDL Ratio: 3.9 ratio (ref 0.0–4.4)
Cholesterol, Total: 221 mg/dL — ABNORMAL HIGH (ref 100–199)
HDL: 57 mg/dL (ref >39)
LDL CALC: 151 mg/dL — AB (ref 0–99)
Triglycerides: 67 mg/dL (ref 0–149)
VLDL CHOLESTEROL CAL: 13 mg/dL (ref 5–40)

## 2016-12-22 ENCOUNTER — Other Ambulatory Visit: Payer: Self-pay | Admitting: Family Medicine

## 2016-12-22 DIAGNOSIS — E782 Mixed hyperlipidemia: Secondary | ICD-10-CM

## 2016-12-22 MED ORDER — ATORVASTATIN CALCIUM 20 MG PO TABS: 20.0000 mg | ORAL_TABLET | Freq: Every day | ORAL | 2 refills | Status: DC

## 2016-12-31 ENCOUNTER — Other Ambulatory Visit: Payer: Self-pay | Admitting: Family Medicine

## 2016-12-31 ENCOUNTER — Ambulatory Visit: Payer: Self-pay | Attending: Family Medicine | Admitting: *Deleted

## 2016-12-31 ENCOUNTER — Ambulatory Visit: Payer: Self-pay

## 2016-12-31 VITALS — BP 178/108 | HR 73

## 2016-12-31 DIAGNOSIS — I10 Essential (primary) hypertension: Secondary | ICD-10-CM

## 2016-12-31 MED ORDER — LISINOPRIL-HYDROCHLOROTHIAZIDE 20-25 MG PO TABS: 1.0000 | ORAL_TABLET | Freq: Every day | ORAL | 0 refills | Status: DC

## 2016-12-31 MED FILL — LISINOPRIL-HCTZ 20-25 MG TA: 20-25 | 17 days supply | Qty: 17 | Fill #0

## 2016-12-31 NOTE — Progress Notes (Signed)
Pt arrived to [facility], alert and oriented.   Pt denies chest pain, SOB, HA, dizziness, or blurred vision.  Verified medication. Pt states medication was taken this morning.She took last dose today of Lisinopril- HCTZ. She states she has takes 2 pills some days because she fills tired and that her BP is elevated. Pt educated to take medications as prescribed. Pt verbalized understanding on how to take medication going forward.   Pt to return to clinic for nurse visit in 1 week. She was given prescription to take to pharmacy since she has 16 days until next dose.   Blood pressure reading: 178/108  No clonidine administered for N.V.  Pt PCP aware.

## 2017-01-13 MED FILL — ?DICLOFENAC SOD DR 75 MG TA: 75 | 30 days supply | Qty: 60 | Fill #0

## 2017-01-13 MED FILL — MEGESTROL 40 MG TABLET: 40 | 30 days supply | Qty: 120 | Fill #1

## 2017-01-13 MED FILL — LISINOPRIL-HCTZ 20-25 MG TA: 20-25 | 30 days supply | Qty: 30 | Fill #1

## 2017-01-17 ENCOUNTER — Telehealth: Payer: Self-pay | Admitting: *Deleted

## 2017-01-17 NOTE — Telephone Encounter (Signed)
-----   Message from Alfonse Spruce, [place] sent at 12/22/2016  5:45 PM EST ----- Lipid levels were elevated. This can increase your risk of heart disease overtime. You will be prescribed atorvastatin. Start eating a diet low in saturated fat. Limit your intake of fried foods, red meats, and whole milk.

## 2017-01-17 NOTE — Telephone Encounter (Signed)
Medical Assistant used [employer] to contact patient.  Interpreter Name: Nelia Shi #: [id number] Patient was not available, [employer] left patient a voicemail. Voicemail states to give a call back to [place] with [facility] at [phone]. !!!Please inform patient of lipids being elevated and to take atorvastatin and limit saturated fat intake and increase fiber and exercise!!

## 2017-01-22 ENCOUNTER — Emergency Department (HOSPITAL_COMMUNITY)
Admission: EM | Admit: 2017-01-22 | Discharge: 2017-01-22 | Disposition: A | Discharge status: Home / Self Care | Payer: Self-pay | Attending: Emergency Medicine | Admitting: Emergency Medicine

## 2017-01-22 ENCOUNTER — Encounter (HOSPITAL_COMMUNITY): Payer: Self-pay | Admitting: Emergency Medicine

## 2017-01-22 DIAGNOSIS — Z79899 Other long term (current) drug therapy: Secondary | ICD-10-CM | POA: Insufficient documentation

## 2017-01-22 DIAGNOSIS — I10 Essential (primary) hypertension: Secondary | ICD-10-CM | POA: Insufficient documentation

## 2017-01-22 MED ORDER — LISINOPRIL 20 MG PO TABS
40.0000 mg | ORAL_TABLET | Freq: Once | ORAL | Status: AC
  Administered 2017-01-22: 40 mg via ORAL
  Filled 2017-01-22: qty 2

## 2017-01-22 MED ORDER — HYDROCHLOROTHIAZIDE 25 MG PO TABS: 25.0000 mg | ORAL_TABLET | Freq: Every day | ORAL | Status: DC

## 2017-01-22 MED ORDER — HYDROCHLOROTHIAZIDE 12.5 MG PO CAPS
12.5000 mg | ORAL_CAPSULE | Freq: Every day | ORAL | Status: DC
  Administered 2017-01-22: 12.5 mg via ORAL
  Filled 2017-01-22: qty 1

## 2017-01-22 MED ORDER — LISINOPRIL-HYDROCHLOROTHIAZIDE 20-12.5 MG PO TABS: 1.0000 | ORAL_TABLET | Freq: Every day | ORAL | 0 refills | Status: DC

## 2017-01-22 NOTE — Care Management (Signed)
ED CM noted patient's last visit at the [facility] was 12/31/16 for B/P check. Patient reported to nurse that she has been taking 2 lisinopril- HCTZ some days because she felt tired and she felt her B/P to be elevated. Nurse instructed patient to take medications as prescribed and was to return for nurse visit in a week, patient no showed. ED CM will notify [facility] CM patient to follow up at  clinic Monday morning. Updated EDP on plan she is agreeable. No further ED CM needs identified.

## 2017-01-22 NOTE — ED Notes (Signed)
Pt refused to leave BP cuff on arm stating it was too tight. She took the BP cuff off

## 2017-01-22 NOTE — ED Triage Notes (Signed)
Pt refusing BP in triage.

## 2017-01-22 NOTE — ED Triage Notes (Signed)
Pt reports she has not taken BP meds  Since WED. Pt reports she she does not like the new BP meds. Pt is a goes to [facility]. Pt has come here today for med refill.

## 2017-01-22 NOTE — ED Triage Notes (Signed)
Pt reports she had a medication change 2 weeks ago, she is now taking Lisinopril-HCTZ. She is having itching that has been present X2 weeks... Pt is just now presenting.... Pt is requesting a medication change.

## 2017-01-22 NOTE — ED Provider Notes (Signed)
[facility] EMERGENCY DEPARTMENT Provider Note   CSN: [id number] Arrival date & time: 01/22/17  0501     History   Chief Complaint Chief Complaint  Patient presents with  . Hypertension    HPI Cindy Garrison is a 47 y.o. female with a history of hypertension presents today with chief complaint acute onset, resolved sensation of itching.  Patient states that her dosage of lisinopril/HCTZ 20-25mg  was increased by her primary care physician 2 weeks ago.  She noted a cough and sensation of itching to her skin all over after starting this medicine.  She stopped this medication 3 days ago and her symptoms resolved.  She is requesting refill of her lisinopril/HCTZ 20-12.5 mg tablets as she states she did not have the symptoms on this medicine and she was on it for over 10 years.  She states she does not take her blood pressure at home.  She has not had any blood pressure medications in 3 days.  She denies swelling of the lips or tongue, difficulty breathing, chest pain, headaches, abdominal pain, or difficulty producing urine.  [place] language interpreter was used during the encounter.  The history is provided by the patient.    Past Medical History:  Diagnosis Date  . #[id number]   . Acne   . Back pain   . Essential hypertension   . Seasonal allergies     Patient Active Problem List   Diagnosis Date Noted  . Fibroid uterus 03/26/2015  . DUB (dysfunctional uterine bleeding) 03/26/2015  . Iron deficiency anemia due to chronic blood loss 11/22/2014  . Absolute anemia   . Syncope 09/16/2014  . Anemia 09/16/2014  . Menorrhagia 09/16/2014  . Schizoaffective disorder ([place]) 09/16/2014  . Syncope and collapse 09/16/2014  . Back pain   . Essential hypertension   . Allergic rhinitis 09/13/2006    Past Surgical History:  Procedure Laterality Date  . NO PAST SURGERIES      OB History    Gravida Para Term Preterm AB Living   3 0 0 0 3 0   SAB TAB Ectopic Multiple  Live Births   3 0 0 0         Home Medications    Prior to Admission medications   Medication Sig Start Date End Date Taking? Authorizing Provider  acetaminophen (TYLENOL) 500 MG tablet Take 1,000 mg by mouth every 6 (six) hours as needed.    [provider]  atorvastatin (LIPITOR) 20 MG tablet Take 1 tablet (20 mg total) by mouth daily. 12/22/16   Alfonse Spruce, FNP  capsicum (ZOSTRIX) 0.075 % topical cream Apply 1 application topically 2 (two) times daily as needed. Patient not taking: Reported on 12/31/2016 02/23/16   Alfonse Spruce, FNP  diclofenac (VOLTAREN) 75 MG EC tablet Take 1 tablet (75 mg total) by mouth 2 (two) times daily as needed for moderate pain. Take with food. 12/17/16   Alfonse Spruce, FNP  ferrous sulfate 325 (65 FE) MG tablet Take 1 tablet (325 mg total) by mouth daily with breakfast. Patient not taking: Reported on 03/31/2016 03/04/16   Anyanwu, Sallyanne Havers, MD  fexofenadine-pseudoephedrine (ALLEGRA-D) 60-120 MG 12 hr tablet Take 1 tablet by mouth every 12 (twelve) hours. Patient not taking: Reported on 12/17/2016 07/07/16   Lysbeth Penner, FNP  lisinopril-hydrochlorothiazide (PRINZIDE,ZESTORETIC) 20-12.5 MG tablet Take 1 tablet by mouth daily for 7 days. 01/22/17 01/29/17  Rodell Perna A, PA-C  megestrol (MEGACE) 40 MG tablet Take  1 tablet (40 mg total) by mouth 4 (four) times daily. 12/15/16   Lavonia Drafts, MD  naproxen (NAPROSYN) 375 MG tablet Take 1 tablet (375 mg total) by mouth 2 (two) times daily with a meal. 09/07/16   Vanessa Kick, MD    Family History No family history on file.  Social History Social History   Tobacco Use  . Smoking status: Never Smoker  . Smokeless tobacco: Never Used  Substance Use Topics  . Alcohol use: Yes    Comment: occ.  . Drug use: No     Allergies   Patient has no known allergies.   Review of Systems Review of Systems  Constitutional: Negative for chills and fever.  HENT: Negative  for facial swelling.   Eyes: Negative for visual disturbance.  Respiratory: Positive for cough (resolved). Negative for shortness of breath.   Cardiovascular: Negative for chest pain.  Gastrointestinal: Negative for abdominal pain.  Genitourinary: Negative for difficulty urinating.  Skin:       + itching, resolved  Neurological: Negative for headaches.  All other systems reviewed and are negative.    Physical Exam Updated Vital Signs BP (!) 212/104 (BP Location: Left Arm)   Pulse 88   Temp 98.9 F (37.2 C) (Oral)   Resp 18   Ht 5\' 3"  (1.6 m)   Wt 68 kg (150 lb)   SpO2 100%   BMI 26.57 kg/m   Physical Exam  Constitutional: She is oriented to person, place, and time. She appears well-developed and well-nourished. No distress.  HENT:  Head: Normocephalic and atraumatic.  Eyes: Conjunctivae and EOM are normal. Pupils are equal, round, and reactive to light. Right eye exhibits no discharge. Left eye exhibits no discharge.  Neck: Normal range of motion. Neck supple. No JVD present. No tracheal deviation present.  Cardiovascular: Normal rate, regular rhythm, normal heart sounds and intact distal pulses.  Pulmonary/Chest: Effort normal and breath sounds normal.  Abdominal: Soft. She exhibits no distension. There is no tenderness.  Musculoskeletal: She exhibits no edema.  Lymphadenopathy:    She has no cervical adenopathy.  Neurological: She is alert and oriented to person, place, and time. No cranial nerve deficit or sensory deficit.  Fluent speech, no facial droop  Skin: Skin is warm and dry. No erythema.  Psychiatric: She has a normal mood and affect. Her behavior is normal.  Nursing note and vitals reviewed.    ED Treatments / Results  Labs (all labs ordered are listed, but only abnormal results are displayed) Labs Reviewed - No data to display  EKG  EKG Interpretation None       Radiology No results found.  Procedures Procedures (including critical care  time)  Medications Ordered in ED Medications  hydrochlorothiazide (MICROZIDE) capsule 12.5 mg (12.5 mg Oral Given 01/22/17 0922)  lisinopril (PRINIVIL,ZESTRIL) tablet 40 mg (40 mg Oral Given 01/22/17 3710)     Initial Impression / Assessment and Plan / ED Course  I have reviewed the triage vital signs and the nursing notes.  Pertinent labs & imaging results that were available during my care of the patient were reviewed by me and considered in my medical decision making (see chart for details).     Patient presents requesting medication refill of her lisinopril/HCTZ 20-12.5mg . She was recently increased to lisinopril/HCTZ 20-25mg  due to uncontrolled blood pressure by her primary care physician at [facility].  She is afebrile but hypertensive in the emergency department but has not had  her medication for 3 days so this is unsurprising.  Some improvement on reevaluation.  She states that when the medication dosage was changed she experienced itching and cough which resolved after she discontinued the medication 3 days ago.  Chart review shows that she was instructed to follow-up in the clinic after 1 week of initiating the medication which she did not do.  Patient is currently asymptomatic and I have a low suspicion of intracranial abnormality, kidney injury, ACS/MI, or acute abdominal pathology in the absence of symptoms.  No focal neurological deficits on examination.  No evidence of angioedema or airway compromise.  She is tolerating food and fluids without difficulty in the emergency department.  Spoke with Mariann Laster Case manager who states that patient must present to [facility] on Monday for a walk-in appointment, blood pressure recheck, and medication management.  Discussed this with the patient who verbalized understanding of and agreement with this plan.  She has no complaints prior to discharge.  Final Clinical Impressions(s) / ED Diagnoses    Final diagnoses:  Essential hypertension    ED Discharge Orders        Ordered    lisinopril-hydrochlorothiazide (PRINZIDE,ZESTORETIC) 20-12.5 MG tablet  Daily     01/22/17 0915    [place] language interpreter was used during the encounter.   Renita Papa, PA-C 01/22/17 1028    Charlesetta Shanks, MD 01/23/17 1142

## 2017-01-22 NOTE — ED Notes (Signed)
Declined W/C at D/C and was escorted to lobby by RN. 

## 2017-01-22 NOTE — Discharge Instructions (Addendum)
Go to [facility] on Monday for a walk-in visit, medication management and blood pressure check.  Continue taking your home medicines as prescribed.  They may want to add a new medicine to help control your blood pressure.  Return to the ED immediately if any concerning signs or symptom develop such as headaches, vision changes, chest pain, not producing urine, or swelling of the lips or tongue.

## 2017-02-04 ENCOUNTER — Ambulatory Visit: Payer: Self-pay | Admitting: Family Medicine

## 2017-02-04 MED FILL — MEGESTROL 40 MG TABLET: 40 | 30 days supply | Qty: 120 | Fill #2

## 2017-02-17 ENCOUNTER — Ambulatory Visit: Payer: Self-pay | Admitting: Family Medicine

## 2017-02-24 ENCOUNTER — Other Ambulatory Visit: Payer: Self-pay | Admitting: Family Medicine

## 2017-02-24 DIAGNOSIS — I10 Essential (primary) hypertension: Secondary | ICD-10-CM

## 2017-02-25 MED FILL — LISINOPRIL-HCTZ 20-25 MG TA: 20-25 | 30 days supply | Qty: 30 | Fill #0

## 2017-02-25 NOTE — Telephone Encounter (Signed)
Schedule OV, courtesy refill has been provided.

## 2017-03-01 ENCOUNTER — Ambulatory Visit: Payer: Self-pay | Admitting: Family Medicine

## 2017-03-02 MED FILL — ?DICLOFENAC SOD DR 75 MG TA: 75 | 30 days supply | Qty: 60 | Fill #1

## 2017-03-02 MED FILL — MEGESTROL 40 MG TABLET: 40 | 30 days supply | Qty: 120 | Fill #3

## 2017-03-17 ENCOUNTER — Emergency Department (HOSPITAL_COMMUNITY)
Admission: EM | Admit: 2017-03-17 | Discharge: 2017-03-17 | Disposition: A | Discharge status: Home / Self Care | Payer: Self-pay | Attending: Physician Assistant | Admitting: Physician Assistant

## 2017-03-17 ENCOUNTER — Other Ambulatory Visit: Payer: Self-pay

## 2017-03-17 ENCOUNTER — Encounter (HOSPITAL_COMMUNITY): Payer: Self-pay

## 2017-03-17 DIAGNOSIS — Z79899 Other long term (current) drug therapy: Secondary | ICD-10-CM | POA: Insufficient documentation

## 2017-03-17 DIAGNOSIS — L2082 Flexural eczema: Secondary | ICD-10-CM | POA: Insufficient documentation

## 2017-03-17 DIAGNOSIS — I10 Essential (primary) hypertension: Secondary | ICD-10-CM | POA: Insufficient documentation

## 2017-03-17 DIAGNOSIS — E871 Hypo-osmolality and hyponatremia: Secondary | ICD-10-CM | POA: Insufficient documentation

## 2017-03-17 DIAGNOSIS — R21 Rash and other nonspecific skin eruption: Secondary | ICD-10-CM

## 2017-03-17 LAB — CBC WITH DIFFERENTIAL/PLATELET
BASOS ABS: 0 10*3/uL (ref 0.0–0.1)
Basophils Relative: 0 %
Eosinophils Absolute: 2 10*3/uL — ABNORMAL HIGH (ref 0.0–0.7)
Eosinophils Relative: 17 %
HEMATOCRIT: 37.9 % (ref 36.0–46.0)
Hemoglobin: 14 g/dL (ref 12.0–15.0)
LYMPHS ABS: 2.1 10*3/uL (ref 0.7–4.0)
LYMPHS PCT: 18 %
MCH: 26.9 pg (ref 26.0–34.0)
MCHC: 36.9 g/dL — ABNORMAL HIGH (ref 30.0–36.0)
MCV: 72.7 fL — ABNORMAL LOW (ref 78.0–100.0)
MONOS PCT: 6 %
Monocytes Absolute: 0.7 10*3/uL (ref 0.1–1.0)
NEUTROS PCT: 59 %
Neutro Abs: 7 10*3/uL (ref 1.7–7.7)
Platelets: 326 10*3/uL (ref 150–400)
RBC: 5.21 MIL/uL — ABNORMAL HIGH (ref 3.87–5.11)
RDW: 14.3 % (ref 11.5–15.5)
WBC: 11.8 10*3/uL — AB (ref 4.0–10.5)

## 2017-03-17 LAB — COMPREHENSIVE METABOLIC PANEL
ALBUMIN: 3.6 g/dL (ref 3.5–5.0)
ALT: 25 U/L (ref 14–54)
AST: 23 U/L (ref 15–41)
Alkaline Phosphatase: 45 U/L (ref 38–126)
Anion gap: 12 (ref 5–15)
BILIRUBIN TOTAL: 0.8 mg/dL (ref 0.3–1.2)
BUN: 5 mg/dL — AB (ref 6–20)
CHLORIDE: 97 mmol/L — AB (ref 101–111)
CO2: 20 mmol/L — ABNORMAL LOW (ref 22–32)
Calcium: 8.6 mg/dL — ABNORMAL LOW (ref 8.9–10.3)
Creatinine, Ser: 0.68 mg/dL (ref 0.44–1.00)
GFR calc Af Amer: >60 mL/min (ref >60)
GFR calc non Af Amer: >60 mL/min (ref >60)
Glucose, Bld: 87 mg/dL (ref 65–99)
POTASSIUM: 3.4 mmol/L — AB (ref 3.5–5.1)
Sodium: 129 mmol/L — ABNORMAL LOW (ref 135–145)
TOTAL PROTEIN: 6.1 g/dL — AB (ref 6.5–8.1)

## 2017-03-17 LAB — I-STAT TROPONIN, ED: Troponin i, poc: 0 ng/mL (ref 0.00–0.08)

## 2017-03-17 MED ORDER — FLUCONAZOLE 150 MG PO TABS
150.0000 mg | ORAL_TABLET | Freq: Once | ORAL | Status: AC
  Administered 2017-03-17: 150 mg via ORAL
  Filled 2017-03-17: qty 1

## 2017-03-17 MED ORDER — TRIAMCINOLONE ACETONIDE 0.1 % EX CREA: 1.0000 "application " | TOPICAL_CREAM | Freq: Two times a day (BID) | CUTANEOUS | 0 refills | Status: DC

## 2017-03-17 MED ORDER — PREDNISONE 20 MG PO TABS
60.0000 mg | ORAL_TABLET | Freq: Once | ORAL | Status: AC
  Administered 2017-03-17: 60 mg via ORAL
  Filled 2017-03-17: qty 3

## 2017-03-17 MED ORDER — PREDNISONE 20 MG PO TABS: ORAL_TABLET | ORAL | 0 refills | Status: DC

## 2017-03-17 MED ORDER — HYDROXYZINE HCL 25 MG PO TABS: 25.0000 mg | ORAL_TABLET | Freq: Four times a day (QID) | ORAL | 0 refills | Status: DC

## 2017-03-17 MED ORDER — TRIAMCINOLONE 0.1 % CREAM:EUCERIN CREAM 1:1
TOPICAL_CREAM | Freq: Three times a day (TID) | CUTANEOUS | Status: DC | PRN
  Administered 2017-03-17: 13:00:00 via TOPICAL
  Filled 2017-03-17 (×3): qty 1

## 2017-03-17 NOTE — Discharge Instructions (Addendum)
You were seen with a rash called eczema.  We want you to use the cream that we have provided.  Additionally use the steroid taper.  Your sodium is low today.  We want you to decrease your lisinopril by half.  And then follow-up with your primary care physician this week as planned.

## 2017-03-17 NOTE — ED Provider Notes (Addendum)
[facility] EMERGENCY DEPARTMENT Provider Note   CSN: [id number] Arrival date & time: 03/17/17  6712     History   Chief Complaint Chief Complaint  Patient presents with  . Rash    HPI Cindy Garrison is a 47 y.o. female.  HPI   47 year old female presenting with past medical history significant for hypertension and iron deficiency anemia with menorrhagia.  Patient is [place] speaking.  Patient is here with increased rash.  Patient rash on bilateral antecubital fossa.  She has a rash behind bilateral ears and on her abdomen.  She reports is been worse over the last 2-3 weeks.  She thought it was secondary to increasing her high blood pressure medication.  Patient is feeling brace here and has had up a primary care appointment with her for next Wednesday.  She also complains of heavy periods, and occasional dizziness.  Patient has not tried any medications or creams for the rash. Past Medical History:  Diagnosis Date  . #[id number]   . Acne   . Back pain   . Essential hypertension   . Seasonal allergies     Patient Active Problem List   Diagnosis Date Noted  . Fibroid uterus 03/26/2015  . DUB (dysfunctional uterine bleeding) 03/26/2015  . Iron deficiency anemia due to chronic blood loss 11/22/2014  . Absolute anemia   . Syncope 09/16/2014  . Anemia 09/16/2014  . Menorrhagia 09/16/2014  . Schizoaffective disorder ([place]) 09/16/2014  . Syncope and collapse 09/16/2014  . Back pain   . Essential hypertension   . Allergic rhinitis 09/13/2006    Past Surgical History:  Procedure Laterality Date  . NO PAST SURGERIES      OB History    Gravida Para Term Preterm AB Living   3 0 0 0 3 0   SAB TAB Ectopic Multiple Live Births   3 0 0 0         Home Medications    Prior to Admission medications   Medication Sig Start Date End Date Taking? Authorizing Provider  acetaminophen (TYLENOL) 500 MG tablet Take 1,000 mg by mouth every 6 (six) hours as  needed.    [provider]  atorvastatin (LIPITOR) 20 MG tablet Take 1 tablet (20 mg total) by mouth daily. 12/22/16   Alfonse Spruce, FNP  capsicum (ZOSTRIX) 0.075 % topical cream Apply 1 application topically 2 (two) times daily as needed. Patient not taking: Reported on 12/31/2016 02/23/16   Alfonse Spruce, FNP  diclofenac (VOLTAREN) 75 MG EC tablet Take 1 tablet (75 mg total) by mouth 2 (two) times daily as needed for moderate pain. Take with food. 12/17/16   Alfonse Spruce, FNP  ferrous sulfate 325 (65 FE) MG tablet Take 1 tablet (325 mg total) by mouth daily with breakfast. Patient not taking: Reported on 03/31/2016 03/04/16   Anyanwu, Sallyanne Havers, MD  fexofenadine-pseudoephedrine (ALLEGRA-D) 60-120 MG 12 hr tablet Take 1 tablet by mouth every 12 (twelve) hours. Patient not taking: Reported on 12/17/2016 07/07/16   Lysbeth Penner, FNP  lisinopril-hydrochlorothiazide (PRINZIDE,ZESTORETIC) 20-12.5 MG tablet Take 1 tablet by mouth daily for 7 days. 01/22/17 01/29/17  Fawze, Mina A, PA-C  lisinopril-hydrochlorothiazide (PRINZIDE,ZESTORETIC) 20-25 MG tablet TAKE 1 TABLET BY MOUTH DAILY. 02/25/17   Alfonse Spruce, FNP  megestrol (MEGACE) 40 MG tablet Take 1 tablet (40 mg total) by mouth 4 (four) times daily. 12/15/16   Lavonia Drafts, MD  naproxen (NAPROSYN) 375 MG tablet Take 1 tablet (  375 mg total) by mouth 2 (two) times daily with a meal. 09/07/16   Vanessa Kick, MD    Family History History reviewed. No pertinent family history.  Social History Social History   Tobacco Use  . Smoking status: Never Smoker  . Smokeless tobacco: Never Used  Substance Use Topics  . Alcohol use: Yes    Comment: occ.  . Drug use: No     Allergies   Patient has no known allergies.   Review of Systems Review of Systems  Constitutional: Positive for fatigue. Negative for fever.  Respiratory: Negative for chest tightness and shortness of breath.   Skin: Positive for  rash.  Neurological: Positive for light-headedness.     Physical Exam Updated Vital Signs BP (!) 151/87 (BP Location: Right Arm)   Pulse 99   Temp 98.2 F (36.8 C) (Oral)   Resp 20   SpO2 100%   Physical Exam  Constitutional: She is oriented to person, place, and time. She appears well-developed and well-nourished.  HENT:  Head: Normocephalic and atraumatic.  Eyes: Right eye exhibits no discharge. Left eye exhibits no discharge.  Cardiovascular: Normal rate and regular rhythm.  Pulmonary/Chest: Effort normal and breath sounds normal. No respiratory distress.  Abdominal: Soft. She exhibits no distension.  Neurological: She is oriented to person, place, and time.  Skin: Skin is warm and dry. She is not diaphoretic.  Rash present bilateral antecubital fossa, below behind both ears, and on abdomen.  It is chronic in appearance excoriated in parts.  Consistent with eczema.  Psychiatric: She has a normal mood and affect.  Nursing note and vitals reviewed.          ED Treatments / Results  Labs (all labs ordered are listed, but only abnormal results are displayed) Labs Reviewed  CBC WITH DIFFERENTIAL/PLATELET  COMPREHENSIVE METABOLIC PANEL  I-STAT TROPONIN, ED    EKG  EKG Interpretation  Date/Time:  Thursday March 17 2017 11:21:28 EST Ventricular Rate:  95 PR Interval:    QRS Duration: 81 QT Interval:  370 QTC Calculation: 466 R Axis:   37 Text Interpretation:  Sinus rhythm Low voltage, precordial leads Borderline T wave abnormalities Normal sinus rhythm Confirmed by Thomasene Lot, [place] ([id number]) on 03/17/2017 11:24:17 AM       Radiology No results found.  Procedures Procedures (including critical care time)  Medications Ordered in ED Medications  triamcinolone 0.1 % cream : eucerin cream, 1:1 (not administered)  predniSONE (DELTASONE) tablet 60 mg (not administered)  fluconazole (DIFLUCAN) tablet 150 mg (not administered)     Initial Impression /  Assessment and Plan / ED Course  I have reviewed the triage vital signs and the nursing notes.  Pertinent labs & imaging results that were available during my care of the patient were reviewed by me and considered in my medical decision making (see chart for details).     47 year old female presenting with past medical history significant for hypertension and iron deficiency anemia with menorrhagia.  Patient is [place] speaking.  Patient is here with increased rash.  Patient rash on bilateral antecubital fossa.  She has a rash behind bilateral ears and on her abdomen.  She reports is been worse over the last 2-3 weeks.  She thought it was secondary to increasing her high blood pressure medication.  Patient is feeling brace here and has had up a primary care appointment with her for next Wednesday.  She also complains of heavy periods, and occasional dizziness.  Patient has not tried  any medications or creams for the rash.   11:27 AM Of note patient not taking her Zyprexa.  She is here with increased itching and rash.  Patient's primary care physician appointment next week.  Will check baseline blood work.  Give patient steroid burst as well as topical cream.  Have her follow-up with primary care within a week  12:59 PM Patient noted to have low sodium.  We think is likely she just increased her lisinopril.  We will have her decrease it back to prior dose and then follow-up with her primary care physician and have her labs rechecked within the week..  Final Clinical Impressions(s) / ED Diagnoses   Final diagnoses:  None    ED Discharge Orders    None       Harry Shuck, Fredia Sorrow, MD 03/17/17 1127    Macarthur Critchley, MD 03/17/17 1300

## 2017-03-17 NOTE — ED Triage Notes (Signed)
Pt has rash to the antecubital area, her abdomen, neck, behind her ears. Pt states she has had the rash X3 weeks. She states her PCP recently changed her medication. Pt denies any shortness of breath. Airway intact.

## 2017-03-22 ENCOUNTER — Other Ambulatory Visit: Payer: Self-pay | Admitting: Family Medicine

## 2017-03-22 DIAGNOSIS — N938 Other specified abnormal uterine and vaginal bleeding: Secondary | ICD-10-CM

## 2017-03-23 ENCOUNTER — Ambulatory Visit: Payer: Self-pay | Attending: Family Medicine | Admitting: Physician Assistant

## 2017-03-23 ENCOUNTER — Telehealth: Payer: Self-pay | Admitting: *Deleted

## 2017-03-23 ENCOUNTER — Telehealth: Payer: Self-pay | Admitting: Student

## 2017-03-23 VITALS — BP 150/85 | HR 93 | Temp 98.5°F | Resp 18 | Ht 61.5 in | Wt 139.6 lb

## 2017-03-23 DIAGNOSIS — N938 Other specified abnormal uterine and vaginal bleeding: Secondary | ICD-10-CM | POA: Insufficient documentation

## 2017-03-23 DIAGNOSIS — Z79899 Other long term (current) drug therapy: Secondary | ICD-10-CM | POA: Insufficient documentation

## 2017-03-23 DIAGNOSIS — I10 Essential (primary) hypertension: Secondary | ICD-10-CM | POA: Insufficient documentation

## 2017-03-23 DIAGNOSIS — E871 Hypo-osmolality and hyponatremia: Secondary | ICD-10-CM | POA: Insufficient documentation

## 2017-03-23 DIAGNOSIS — L2082 Flexural eczema: Secondary | ICD-10-CM | POA: Insufficient documentation

## 2017-03-23 MED ORDER — TRIAMCINOLONE ACETONIDE 0.1 % EX CREA: 1.0000 "application " | TOPICAL_CREAM | Freq: Two times a day (BID) | CUTANEOUS | 5 refills | Status: DC

## 2017-03-23 MED ORDER — LISINOPRIL 40 MG PO TABS: 40.0000 mg | ORAL_TABLET | Freq: Every day | ORAL | 3 refills | Status: DC

## 2017-03-23 MED ORDER — MEGESTROL ACETATE 40 MG PO TABS: 40.0000 mg | ORAL_TABLET | Freq: Four times a day (QID) | ORAL | 1 refills | Status: DC

## 2017-03-23 MED FILL — TRIAMCINOLONE ACETONIDE 0.1: 0.1 | 30 days supply | Qty: 30 | Fill #0

## 2017-03-23 MED FILL — LISINOPRIL 40 MG TAB: 40 | 30 days supply | Qty: 30 | Fill #0

## 2017-03-23 NOTE — Patient Instructions (Signed)
Hyponatremia Hyponatremia is when the amount of salt (sodium) in your blood is too low. When salt levels are low, your cells absorb extra water and they swell. The swelling happens throughout the body, but it mostly affects the brain. Follow these instructions at home:  Take medicines only as told by your doctor. Many medicines can make this condition worse. Talk with your doctor about any medicines that you are currently taking.  Carefully follow a recommended diet as told by your doctor.  Carefully follow instructions from your doctor about fluid restrictions.  Keep all follow-up visits as told by your doctor. This is important.  Do not drink alcohol. Contact a doctor if:  You feel sicker to your stomach (nauseous).  You feel more confused.  You feel more tired (fatigued).  Your headache gets worse.  You feel weaker.  Your symptoms go away and then they come back.  You have trouble following the diet instructions. Get help right away if:  You start to twitch and shake (have a seizure).  You pass out (faint).  You keep having watery poop (diarrhea).  You keep throwing up (vomiting). This information is not intended to replace advice given to you by your health care provider. Make sure you discuss any questions you have with your health care provider. Document Released: 09/16/2010 Document Revised: 06/12/2015 Document Reviewed: 12/31/2013 Elsevier Interactive Patient Education  2018 [employer]. Eczema Eczema is a broad term for a group of skin conditions that cause skin to become rough and inflamed. Each type of eczema has different triggers, symptoms, and treatments. Eczema of any type is usually itchy and symptoms range from mild to severe. Eczema and its symptoms are not spread from person to person (are not contagious). It can appear on different parts of the body at different times. Your eczema may not look the same as someone else's eczema. What are the types of  eczema? Atopic dermatitis This is a long-term (chronic) skin disease that keeps coming back (recurring). Usual symptoms are dry skin and small, solid pimples that may swell and leak fluid (weep). Contact dermatitis This happens when something irritates the skin and causes a rash. The irritation can come from substances that you are allergic to (allergens), such as poison ivy, chemicals, or medicines that were applied to your skin. Dyshidrotic eczema This is a form of eczema on the hands and feet. It shows up as very itchy, fluid-filled blisters. It can affect people of any age, but is more common before age 60. Hand eczema This causes very itchy areas of skin on the palms and sides of the hands and fingers. This type of eczema is common in industrial jobs where you may be exposed to many different types of irritants. Lichen simplex chronicus This type of eczema occurs when a person constantly scratches one area of the body. Repeated scratching of the area leads to thickened skin (lichenification). Lichen simplex chronicus can occur along with other types of eczema. It is more common in adults, but may be seen in children as well. Nummular eczema This is a common type of eczema. It has no known cause. It typically causes a red, circular, crusty lesion (plaque) that may be itchy. Scratching may become a habit and can cause bleeding. Nummular eczema occurs most often in people of middle-age or older. It most often affects the hands. Seborrheic dermatitis This is a common skin disease that mainly affects the scalp. It may also affect any oily areas of the body,  such as the face, sides of nose, eyebrows, ears, eyelids, and chest. It is marked by small scaling and redness of the skin (erythema). This can affect people of all ages. In infants, this condition is known as Chartered certified accountant." Stasis dermatitis This is a common skin disease that usually appears on the legs and feet. It most often occurs in people who  have a condition that prevents blood from being pumped through the veins in the legs (chronic venous insufficiency). Stasis dermatitis is a chronic condition that needs long-term management. How is eczema diagnosed? Your health care provider will examine your skin and review your medical history. He or she may also give you skin patch tests. These tests involve taking patches that contain possible allergens and placing them on your back. He or she will then check in a few days to see if an allergic reaction occurred. What are the common treatments? Treatment for eczema is based on the type of eczema you have. Hydrocortisone steroid medicine can relieve itching quickly and help reduce inflammation. This medicine may be prescribed or obtained over-the-counter, depending on the strength of the medicine that is needed. Follow these instructions at home:  Take over-the-counter and prescription medicines only as told by your health care provider.  Use creams or ointments to moisturize your skin. Do not use lotions.  Learn what triggers or irritates your symptoms. Avoid these things.  Treat symptom flare-ups quickly.  Do not itch your skin. This can make your rash worse.  Keep all follow-up visits as told by your health care provider. This is important. Where to find more information:  The American Academy of Dermatology: http://jones-macias.info/  The National Eczema Association: www.nationaleczema.org Contact a health care provider if:  You have serious itching, even with treatment.  You regularly scratch your skin until it bleeds.  Your rash looks different than usual.  Your skin is painful, swollen, or more red than usual.  You have a fever. Summary  There are eight general types of eczema. Each type has different triggers.  Eczema of any type causes itching that may range from mild to severe.  Treatment varies based on the type of eczema you have. Hydrocortisone steroid medicine can help with  itching and inflammation.  Protecting your skin is the best way to prevent eczema. Use moisturizers and lotions. Avoid triggers and irritants, and treat flare-ups quickly. This information is not intended to replace advice given to you by your health care provider. Make sure you discuss any questions you have with your health care provider. Document Released: 05/20/2016 Document Revised: 05/20/2016 Document Reviewed: 05/20/2016 Elsevier Interactive Patient Education  2018 [employer].

## 2017-03-23 NOTE — Progress Notes (Signed)
Patient ID: Cindy Garrison, female   DOB: 1970-05-31, 47 y.o.   MRN: [id number]     Cindy Garrison, is a 47 y.o. female  XBJ:[id number]  HYQ:[id number]  DOB - 08/26/1970  Subjective:  Chief Complaint and HPI: Cindy Garrison is a 47 y.o. female here today Seen in the ED 03/17/2017 for a rash B forearms, abdomen, and behind ears.  She was treated with oral steroid and topical cream.  CMP revealed hyponatremia/hypokalemia which appears to be long-standing.    Brother here translating.  Refused interpreters.     ROS:   Constitutional:  No f/c, No night sweats, No unexplained weight loss. EENT:  No vision changes, No blurry vision, No hearing changes. No mouth, throat, or ear problems.  Respiratory: No cough, No SOB Cardiac: No CP, no palpitations GI:  No abd pain, No N/V/D. GU: No Urinary s/sx Musculoskeletal: No joint pain Neuro: No headache, no dizziness, no motor weakness.  Skin: + rash Endocrine:  No polydipsia. No polyuria.  Psych: Denies SI/HI  No problems updated.  ALLERGIES: No Known Allergies  PAST MEDICAL HISTORY: Past Medical History:  Diagnosis Date  . #[id number]   . Acne   . Back pain   . Essential hypertension   . Seasonal allergies     MEDICATIONS AT HOME: Prior to Admission medications   Medication Sig Start Date End Date Taking? Authorizing Provider  acetaminophen (TYLENOL) 500 MG tablet Take 1,000 mg by mouth every 6 (six) hours as needed.   Yes [provider]  atorvastatin (LIPITOR) 20 MG tablet Take 1 tablet (20 mg total) by mouth daily. 12/22/16  Yes Hairston, Mandesia R, FNP  capsicum (ZOSTRIX) 0.075 % topical cream Apply 1 application topically 2 (two) times daily as needed. 02/23/16  Yes Alfonse Spruce, FNP  diclofenac (VOLTAREN) 75 MG EC tablet Take 1 tablet (75 mg total) by mouth 2 (two) times daily as needed for moderate pain. Take with food. 12/17/16  Yes Alfonse Spruce, FNP  ferrous sulfate 325 (65 FE) MG tablet Take 1  tablet (325 mg total) by mouth daily with breakfast. 03/04/16  Yes Anyanwu, Sallyanne Havers, MD  hydrOXYzine (ATARAX/VISTARIL) 25 MG tablet Take 1 tablet (25 mg total) by mouth every 6 (six) hours. 03/17/17  Yes Mackuen, Courteney Lyn, MD  megestrol (MEGACE) 40 MG tablet Take 1 tablet (40 mg total) by mouth 4 (four) times daily. 03/23/17  Yes Freeman Caldron M, PA-C  naproxen (NAPROSYN) 375 MG tablet Take 1 tablet (375 mg total) by mouth 2 (two) times daily with a meal. 09/07/16  Yes Hagler, Aaron Edelman, MD  triamcinolone cream (KENALOG) 0.1 % Apply 1 application topically 2 (two) times daily. 03/23/17  Yes Ashante Snelling M, PA-C  fexofenadine-pseudoephedrine (ALLEGRA-D) 60-120 MG 12 hr tablet Take 1 tablet by mouth every 12 (twelve) hours. Patient not taking: Reported on 12/17/2016 07/07/16   Lysbeth Penner, FNP  lisinopril (PRINIVIL,ZESTRIL) 40 MG tablet Take 1 tablet (40 mg total) by mouth daily. 03/23/17   Argentina Donovan, PA-C     Objective:  EXAM:   Vitals:   03/23/17 0844  BP: (!) 150/85  Pulse: 93  Resp: 18  Temp: 98.5 F (36.9 C)  TempSrc: Oral  SpO2: 99%  Weight: 139 lb 9.6 oz (63.3 kg)  Height: 5' 1.5" (1.562 m)    General appearance : A&OX3. NAD. Non-toxic-appearing HEENT: Atraumatic and Normocephalic.  PERRLA. EOM intact.  Neck: supple, no JVD. No cervical lymphadenopathy. No thyromegaly Chest/Lungs:  Breathing-non-labored, Good  air entry bilaterally, breath sounds normal without rales, rhonchi, or wheezing  CVS: S1 S2 regular, no murmurs, gallops, rubs  Extremities: Bilateral Lower Ext shows no edema, both legs are warm to touch with = pulse throughout Neurology:  CN II-XII grossly intact, Non focal.   Psych:  TP linear. J/I WNL. Normal speech. Appropriate eye contact and affect.  Skin:  Flexural eczema-appears much better/improved over photos in ED.  Still moderate in nature.  No secondary infection  Data Review No results found for: HGBA1C   Assessment & Plan   1. DUB  (dysfunctional uterine bleeding) - megestrol (MEGACE) 40 MG tablet; Take 1 tablet (40 mg total) by mouth 4 (four) times daily.  Dispense: 120 tablet; Refill: 1.  See gyn for further RF unless PCP agrees to prescribe.    2. Flexural eczema - triamcinolone cream (KENALOG) 0.1 %; Apply 1 application topically 2 (two) times daily.  Dispense: 30 g; Refill: 5  3. Hypertension, unspecified type Stop Lisinopril/HCTZ due to electrolyte abnormality - lisinopril (PRINIVIL,ZESTRIL) 40 MG tablet; Take 1 tablet (40 mg total) by mouth daily.  Dispense: 90 tablet; Refill: 3 - Comprehensive metabolic panel  4. Hyponatremia May need work-up.   Patient have been counseled extensively about nutrition and exercise  Return in about 3 weeks (around 04/13/2017) for assign new PCP; f/up BP and hyponatremia.  The patient was given clear instructions to go to ER or return to medical center if symptoms don't improve, worsen or new problems develop. The patient verbalized understanding. The patient was told to call to get lab results if they haven't heard anything in the next week.     Freeman Caldron, PA-C [facility], [place]   03/23/2017, 8:56 AM

## 2017-03-23 NOTE — Telephone Encounter (Signed)
Received a voicemail from [facility] that patient has a rx from Dr. Ihor Dow for megestrol 40mg  and it has became apparent she is taking more than advised because she runs out early. They have informed patient will not refill until they talk to [place].Per chart review was originally prescribed for PMB and patient declined to have D&C, etc.  Was last seen in our office 03/2016 and has gotten some refills of megace from [place] and from [facility]- note in chart from [facility] states no refills until seen in office.  Reviewed with Dr. Rosana Hoes and patient may not get refills until she is seen in our office. I called [facility] and informed them no refills until she is seen in our office. She states [facility] providers are also aware no refills until seen in our office. Sent a message to registrars to call her with appointment.   Called patient with [facility] [phone] and  Left message with female who said he is her brother and she is asleep. I asked him to tell her to call our office for appointment before she can get a medication refill and I gave him the number to call for appointments.

## 2017-03-24 LAB — COMPREHENSIVE METABOLIC PANEL
A/G RATIO: 1.8 (ref 1.2–2.2)
ALT: 55 IU/L — AB (ref 0–32)
AST: 19 IU/L (ref 0–40)
Albumin: 4.4 g/dL (ref 3.5–5.5)
Alkaline Phosphatase: 57 IU/L (ref 39–117)
BUN/Creatinine Ratio: 20 (ref 9–23)
BUN: 14 mg/dL (ref 6–24)
Bilirubin Total: 0.3 mg/dL (ref 0.0–1.2)
CALCIUM: 9.1 mg/dL (ref 8.7–10.2)
CO2: 25 mmol/L (ref 20–29)
CREATININE: 0.69 mg/dL (ref 0.57–1.00)
Chloride: 95 mmol/L — ABNORMAL LOW (ref 96–106)
GFR, EST AFRICAN AMERICAN: 119 mL/min/{1.73_m2} (ref >59)
GFR, EST NON AFRICAN AMERICAN: 103 mL/min/{1.73_m2} (ref >59)
Globulin, Total: 2.5 g/dL (ref 1.5–4.5)
Glucose: 123 mg/dL — ABNORMAL HIGH (ref 65–99)
Potassium: 3.5 mmol/L (ref 3.5–5.2)
Sodium: 139 mmol/L (ref 134–144)
TOTAL PROTEIN: 6.9 g/dL (ref 6.0–8.5)

## 2017-03-25 ENCOUNTER — Telehealth: Payer: Self-pay | Admitting: *Deleted

## 2017-03-25 NOTE — Telephone Encounter (Signed)
MA unable to reach patient due to ringing and VM being full. !!!Please inform patient of glucose being elevated and needing to cut back on sweets and carbs (bread/rice/pasta). We will recheck at next visit. Other labs are normal!!!

## 2017-03-25 NOTE — Telephone Encounter (Signed)
-----   Message from Argentina Donovan, [place] sent at 03/24/2017  8:30 AM EST ----- Please call patient and let her know her blood sugar is a little high.  She should cut back on sweets, sugary drinks, juices, desserts, and other white carbohydrates and we will recheck this at her follow-up.  Otherwise labs are good. Thanks, Freeman Caldron, PA-C

## 2017-03-29 MED FILL — TRIAMCINOLONE ACETONIDE 0.1: 0.1 | 30 days supply | Qty: 120 | Fill #1

## 2017-03-29 MED FILL — MEGESTROL 40 MG TABLET: 40 | 30 days supply | Qty: 120 | Fill #4

## 2017-04-18 ENCOUNTER — Ambulatory Visit: Payer: Self-pay | Admitting: Obstetrics & Gynecology

## 2017-04-18 ENCOUNTER — Encounter: Payer: Self-pay | Admitting: Obstetrics & Gynecology

## 2017-04-18 ENCOUNTER — Ambulatory Visit (INDEPENDENT_AMBULATORY_CARE_PROVIDER_SITE_OTHER): Payer: Self-pay | Admitting: Obstetrics & Gynecology

## 2017-04-18 DIAGNOSIS — I10 Essential (primary) hypertension: Secondary | ICD-10-CM

## 2017-04-18 DIAGNOSIS — N938 Other specified abnormal uterine and vaginal bleeding: Secondary | ICD-10-CM

## 2017-04-18 MED ORDER — MEGESTROL ACETATE 40 MG PO TABS: 40.0000 mg | ORAL_TABLET | Freq: Four times a day (QID) | ORAL | 1 refills | Status: DC

## 2017-04-18 MED ORDER — LISINOPRIL 40 MG PO TABS: 40.0000 mg | ORAL_TABLET | Freq: Every day | ORAL | 3 refills | Status: DC

## 2017-04-18 MED FILL — LISINOPRIL 40 MG TAB: 40 | 30 days supply | Qty: 30 | Fill #0

## 2017-04-18 NOTE — Progress Notes (Signed)
   Subjective:    Patient ID: Cindy Garrison, female    DOB: 12/18/1970, 47 y.o.   MRN: [id number]  HPI 48 yo married African American P0 here for a prescription for megace. She takes this for DUB due to fibroids.    Review of Systems   She has not taken her BP medicine for the last 4 days. Objective:   Physical Exam Breathing, conversing, and ambulating normally Video interpretor used for this visit. The patient is a poor historian. Well nourished, well hydrated Black female, no apparent distress Abd- benign     Assessment & Plan:  DUB, fibroids- refill of megace and lisinopril given

## 2017-04-18 NOTE — Progress Notes (Signed)
[employer] interpreter Vernie Shanks [phone]

## 2017-04-20 ENCOUNTER — Ambulatory Visit: Payer: Self-pay | Admitting: Nurse Practitioner

## 2017-04-26 MED FILL — MEGESTROL 40 MG TABLET: 40 | 30 days supply | Qty: 120 | Fill #5

## 2017-04-26 MED FILL — LISINOPRIL 40 MG TABLET: 40 | 30 days supply | Qty: 30 | Fill #1

## 2017-04-26 MED FILL — ?DICLOFENAC SOD DR 75 MG TA: 75 | 30 days supply | Qty: 60 | Fill #2

## 2017-05-30 MED FILL — LISINOPRIL 40 MG TABLET: 40 | 30 days supply | Qty: 30 | Fill #2

## 2017-05-30 MED FILL — MEGESTROL 40 MG TABLET: 40 | 30 days supply | Qty: 120 | Fill #6

## 2017-05-30 MED FILL — DICLOFENAC SOD EC 75 MG TAB: 75 | 30 days supply | Qty: 60 | Fill #3

## 2017-06-02 ENCOUNTER — Ambulatory Visit: Payer: Self-pay | Admitting: Family Medicine

## 2017-08-03 MED FILL — LISINOPRIL 40 MG TABLET: 40 | 30 days supply | Qty: 30 | Fill #3

## 2017-08-03 MED FILL — MEGESTROL 40 MG TABLET: 40 | 30 days supply | Qty: 120 | Fill #0

## 2017-08-09 MED FILL — DICLOFENAC SOD EC 75 MG TAB: 75 | 30 days supply | Qty: 60 | Fill #4

## 2017-09-01 MED FILL — DICLOFENAC SOD EC 75 MG TAB: 75 | 30 days supply | Qty: 60 | Fill #5

## 2017-09-01 MED FILL — MEGESTROL 40 MG TABLET: 40 | 30 days supply | Qty: 120 | Fill #1

## 2017-09-01 MED FILL — LISINOPRIL 40 MG TABLET: 40 | 30 days supply | Qty: 30 | Fill #0

## 2017-09-08 ENCOUNTER — Ambulatory Visit: Payer: Self-pay

## 2017-09-12 ENCOUNTER — Other Ambulatory Visit: Payer: Self-pay

## 2017-09-12 ENCOUNTER — Encounter: Payer: Self-pay | Admitting: Family Medicine

## 2017-09-12 ENCOUNTER — Ambulatory Visit: Payer: Self-pay | Attending: Family Medicine | Admitting: Family Medicine

## 2017-09-12 ENCOUNTER — Ambulatory Visit: Payer: Self-pay | Admitting: Family Medicine

## 2017-09-12 VITALS — BP 166/84 | HR 77 | Temp 98.7°F | Resp 18 | Ht 62.0 in | Wt 128.0 lb

## 2017-09-12 DIAGNOSIS — R739 Hyperglycemia, unspecified: Secondary | ICD-10-CM

## 2017-09-12 DIAGNOSIS — Z79899 Other long term (current) drug therapy: Secondary | ICD-10-CM

## 2017-09-12 DIAGNOSIS — R740 Nonspecific elevation of levels of transaminase and lactic acid dehydrogenase [LDH]: Secondary | ICD-10-CM

## 2017-09-12 DIAGNOSIS — I10 Essential (primary) hypertension: Secondary | ICD-10-CM

## 2017-09-12 DIAGNOSIS — R7401 Elevation of levels of liver transaminase levels: Secondary | ICD-10-CM

## 2017-09-12 LAB — POCT GLYCOSYLATED HEMOGLOBIN (HGB A1C): Hemoglobin A1C: 4.8 % (ref 4.0–5.6)

## 2017-09-12 MED ORDER — AMLODIPINE BESYLATE 5 MG PO TABS: 5.0000 mg | ORAL_TABLET | Freq: Every day | ORAL | 6 refills | Status: DC

## 2017-09-12 NOTE — Progress Notes (Signed)
Subjective:    Patient ID: Cindy Garrison, female    DOB: 13-Apr-1970, 47 y.o.   MRN: [id number]  HPI      47 yo female seen secondary to the complaint of having had pain in her belly button area- dull- which started when she returned from seeing her family in [place] about 4 weeks ago.  Patient states that she also saw a water coming from her bellybutton but this has now stopped.  Patient states that the pain has now resolved.  Patient states that she also after returning from [place] with hearing patient also has hypertension and she states that she did take her lisinopril this morning.  Patient reports no headaches or dizziness related to her blood pressure.  Patient states that she had also been hearing voices in her head and patient went to [facility] and was restarted on Haldol 2 mg at bedtime which she had taken in the past and now the voices are quiet.  Patient states that overall she feels well at this time.  Patient reports past medical history significant for mother having diabetes, patient states that her mother is now deceased.  Patient is married, patient does not smoke.  Patient does not have any children. Past Medical History:  Diagnosis Date  . #[id number]   . Acne   . Back pain   . Essential hypertension   . Seasonal allergies    Social History   Tobacco Use  . Smoking status: Never Smoker  . Smokeless tobacco: Never Used  Substance Use Topics  . Alcohol use: Not Currently    Comment: occ.  . Drug use: No   Review of Systems  Constitutional: Positive for fatigue (occasional). Negative for chills and fever.  HENT: Negative for sore throat and trouble swallowing.   Respiratory: Negative for cough and shortness of breath.   Cardiovascular: Negative for chest pain, palpitations and leg swelling.  Gastrointestinal: Negative for abdominal pain, constipation and nausea.  Endocrine: Negative for polydipsia, polyphagia and polyuria.  Genitourinary: Negative for difficulty  urinating, dysuria and frequency.  Neurological: Negative for dizziness and headaches.  Psychiatric/Behavioral: Negative for hallucinations and suicidal ideas.       Objective:   Physical Exam  Constitutional: She appears well-developed and well-nourished. No distress.  Neck: Normal range of motion. Neck supple. No thyromegaly present.  Cardiovascular: Normal rate and regular rhythm.  Pulmonary/Chest: Effort normal and breath sounds normal.  Abdominal: Soft. She exhibits no mass. There is no tenderness. There is no rebound and no guarding.  No abnormality noted at the umbilicus. No fluid, or discharge, no erythema. Normal appearance and no tenderness present  Musculoskeletal: Normal range of motion. She exhibits no tenderness or deformity.  Psychiatric: She has a normal mood and affect.  Due to some language, it was at times difficult to understand patient but she stated that she did not want an interpreter and [place] is not her primary language. Nurse could not find translator for the language that patient said was her preferred. Patient mentioned hearing voices which has quieted with use of Haldol prescribed by mental health.   Vitals reviewed. BP (!) 166/84 (BP Location: Left Arm, Patient Position: Sitting, Cuff Size: Normal)   Pulse 77   Temp 98.7 F (37.1 C) (Oral)   Resp 18   Ht 5\' 2"  (1.575 m)   Wt 128 lb (58.1 kg)   SpO2 100%   BMI 23.41 kg/m       Assessment & Plan:  1.  Essential hypertension Patient reports that she has taken her blood pressure medication, lisinopril prior to today's visit however patient's blood pressure remains elevated.  We will add amlodipine 5 mg once daily and patient is also to continue use of lisinopril.  Patient will return for blood pressure recheck in approximately 4 weeks with clinical pharmacist. - amLODipine (NORVASC) 5 MG tablet; Take 1 tablet (5 mg total) by mouth daily.  Dispense: 30 tablet; Refill: 6  2. Elevated blood sugar On review  of prior labs, patient has had elevated blood sugar.  Patient will have CMP and hemoglobin A1c in follow-up at today's visit. - Comprehensive metabolic panel - HgB F6O  3. Elevated alanine aminotransferase (ALT) level On review of her prior blood work, patient has had elevated ALT.  Patient will have CMP done in follow-up. - Comprehensive metabolic panel  4. Encounter for long-term current use of medication Patient reports that she is now taking Haldol per [facility] mental health.  Patient has also had past elevated blood sugar as well as elevated ALT and patient will have CMP and hemoglobin A1c done in follow-up of current medication use as well as prior abnormal labs. - Comprehensive metabolic panel  An After Visit Summary was printed and given to the patient. Allergies as of 09/12/2017   No Known Allergies     Medication List        Accurate as of 09/12/17 11:59 PM. Always use your most recent med list.          acetaminophen 500 MG tablet Commonly known as:  TYLENOL Take 1,000 mg by mouth every 6 (six) hours as needed.   amLODipine 5 MG tablet Commonly known as:  NORVASC Take 1 tablet (5 mg total) by mouth daily.   atorvastatin 20 MG tablet Commonly known as:  LIPITOR Take 1 tablet (20 mg total) by mouth daily.   capsicum 0.075 % topical cream Commonly known as:  ZOSTRIX Apply 1 application topically 2 (two) times daily as needed.   diclofenac 75 MG EC tablet Commonly known as:  VOLTAREN Take 1 tablet (75 mg total) by mouth 2 (two) times daily as needed for moderate pain. Take with food.   ferrous sulfate 325 (65 FE) MG tablet Take 1 tablet (325 mg total) by mouth daily with breakfast.   lisinopril 40 MG tablet Commonly known as:  PRINIVIL,ZESTRIL Take 1 tablet (40 mg total) by mouth daily.   megestrol 40 MG tablet Commonly known as:  MEGACE Take 1 tablet (40 mg total) by mouth 4 (four) times daily.     -Patient was offered but declined influenza immunization  at today's visit.  Return in about 3 months (around 12/13/2017) for BP check- 4 weeks with CPP ([facility]); 3 months with PCP.

## 2017-09-12 NOTE — Progress Notes (Signed)
Patient refused flu vaccine today.

## 2017-09-12 NOTE — Patient Instructions (Signed)

## 2017-09-13 LAB — COMPREHENSIVE METABOLIC PANEL WITH GFR
ALT: 10 IU/L (ref 0–32)
AST: 15 IU/L (ref 0–40)
Albumin/Globulin Ratio: 1.4 (ref 1.2–2.2)
Albumin: 4.1 g/dL (ref 3.5–5.5)
Alkaline Phosphatase: 64 IU/L (ref 39–117)
BUN/Creatinine Ratio: 8 — ABNORMAL LOW (ref 9–23)
BUN: 6 mg/dL (ref 6–24)
Bilirubin Total: 0.3 mg/dL (ref 0.0–1.2)
CO2: 21 mmol/L (ref 20–29)
Calcium: 9.3 mg/dL (ref 8.7–10.2)
Chloride: 104 mmol/L (ref 96–106)
Creatinine, Ser: 0.72 mg/dL (ref 0.57–1.00)
GFR calc Af Amer: 114 mL/min/1.73
GFR calc non Af Amer: 99 mL/min/1.73
Globulin, Total: 2.9 g/dL (ref 1.5–4.5)
Glucose: 97 mg/dL (ref 65–99)
Potassium: 4 mmol/L (ref 3.5–5.2)
Sodium: 137 mmol/L (ref 134–144)
Total Protein: 7 g/dL (ref 6.0–8.5)

## 2017-09-14 ENCOUNTER — Telehealth: Payer: Self-pay

## 2017-09-14 NOTE — Telephone Encounter (Signed)
-----   Message from Antony Blackbird, MD sent at 09/13/2017 11:57 AM EDT ----- Notify patient of normal CMP

## 2017-09-14 NOTE — Telephone Encounter (Signed)
Patient was called no answer,LVM. If the patient returns call, please inform her that her lab results were normal.

## 2017-09-28 MED FILL — MEGESTROL 40 MG TABLET: 40 | 30 days supply | Qty: 120 | Fill #0

## 2017-10-10 ENCOUNTER — Encounter: Payer: Self-pay | Admitting: Pharmacist

## 2017-10-14 MED FILL — DICLOFENAC SOD EC 75 MG TAB: 75 | 30 days supply | Qty: 60 | Fill #6

## 2017-10-17 ENCOUNTER — Ambulatory Visit: Payer: Self-pay | Attending: Family Medicine

## 2017-10-17 ENCOUNTER — Ambulatory Visit (HOSPITAL_COMMUNITY)
Admission: EM | Admit: 2017-10-17 | Discharge: 2017-10-17 | Disposition: A | Discharge status: Home / Self Care | Payer: Self-pay | Attending: Family Medicine | Admitting: Family Medicine

## 2017-10-17 ENCOUNTER — Encounter (HOSPITAL_COMMUNITY): Payer: Self-pay | Admitting: Emergency Medicine

## 2017-10-17 DIAGNOSIS — Z79899 Other long term (current) drug therapy: Secondary | ICD-10-CM | POA: Insufficient documentation

## 2017-10-17 DIAGNOSIS — F259 Schizoaffective disorder, unspecified: Secondary | ICD-10-CM | POA: Insufficient documentation

## 2017-10-17 DIAGNOSIS — N920 Excessive and frequent menstruation with regular cycle: Secondary | ICD-10-CM | POA: Insufficient documentation

## 2017-10-17 DIAGNOSIS — N938 Other specified abnormal uterine and vaginal bleeding: Secondary | ICD-10-CM

## 2017-10-17 DIAGNOSIS — I10 Essential (primary) hypertension: Secondary | ICD-10-CM | POA: Insufficient documentation

## 2017-10-17 MED ORDER — MEGESTROL ACETATE 40 MG PO TABS: 40.0000 mg | ORAL_TABLET | Freq: Four times a day (QID) | ORAL | 0 refills | Status: DC

## 2017-10-17 MED ORDER — MEGESTROL ACETATE 40 MG PO TABS: 40.0000 mg | ORAL_TABLET | Freq: Four times a day (QID) | ORAL | 0 refills | Status: AC

## 2017-10-17 MED FILL — LISINOPRIL 40 MG TABLET: 40 | 30 days supply | Qty: 30 | Fill #1

## 2017-10-17 MED FILL — MEGESTROL 40 MG TABLET: 40 | 12 days supply | Qty: 48 | Fill #0

## 2017-10-17 NOTE — Discharge Instructions (Addendum)
Please continue with Megace as prescribed. I have filled enough of your medication to get you to your [facility], where your medication will then be managed.  Please fill your Amlodipine as previously prescribed to help with your blood pressure. Take this today. Please take your lisinopril daily as prescribed.  Please make appointment to follow up with your primary care provider for blood pressure recheck in the next 2 weeks.  If you develop headache, chest pain , dizziness, vision change, abdominal pain or increased bleeding please return to be seen.

## 2017-10-17 NOTE — ED Triage Notes (Addendum)
Pt states sometimes when she works hard she has vaginal bleeding, pt states she takes megace and she takes more to stop the bleeding. Denies bleeding at this time. Pt states she needs refill on megace. [facility] Pt states she has two days left of megace. Pt bp 209, states she took her lisinopril an hour ago.d enies headache or dizziness.

## 2017-10-17 NOTE — ED Provider Notes (Signed)
[facility]    CSN: [id number] Arrival date & time: 10/17/17  1007     History   Chief Complaint Chief Complaint  Patient presents with  . Vaginal Bleeding    HPI Cindy Garrison is a 47 y.o. female.   Cindy Garrison presents with requests for Megace refill. States has been dealing with intermittent abnormal vaginal bleeding for at least a year now. Had stopped bleeding until two weeks ago when she restarted working. States she starts bleeding when she works. Takes megace 4x a day which helps sometimes. No current bleeding, states she was off yesterday. [facility], last seen [facility], per chart review bleeding has been attributed to fibroids. Patient has appointment 10/14 for follow up. Saw her PCP a mo nth ago and was prescribed amlodipine, states she went to fill this today but it was not ready at the pharmacy. Plans to go get this after leaving here. Has been taking lisinopril/hctz 20/12.5, took today prior to being here. Denies headache, chest pain , dizziness. No abdominal pain, back pain or discharge. No fevrs. Hx of htn, DUB, menorrhagia, schizoaffective disorder, anemia.     ROS per HPI.      Past Medical History:  Diagnosis Date  . #[id number]   . Acne   . Back pain   . Essential hypertension   . Seasonal allergies     Patient Active Problem List   Diagnosis Date Noted  . Fibroid uterus 03/26/2015  . DUB (dysfunctional uterine bleeding) 03/26/2015  . Iron deficiency anemia due to chronic blood loss 11/22/2014  . Absolute anemia   . Syncope 09/16/2014  . Anemia 09/16/2014  . Menorrhagia 09/16/2014  . Schizoaffective disorder ([place]) 09/16/2014  . Syncope and collapse 09/16/2014  . Back pain   . Essential hypertension   . Allergic rhinitis 09/13/2006    Past Surgical History:  Procedure Laterality Date  . NO PAST SURGERIES      OB History    Gravida  3   Para  0   Term  0   Preterm  0   AB  3   Living  0     SAB  3   TAB  0   Ectopic  0   Multiple  0   Live Births               Home Medications    Prior to Admission medications   Medication Sig Start Date End Date Taking? Authorizing Provider  acetaminophen (TYLENOL) 500 MG tablet Take 1,000 mg by mouth every 6 (six) hours as needed.    [provider]  amLODipine (NORVASC) 5 MG tablet Take 1 tablet (5 mg total) by mouth daily. 09/12/17   Fulp, Cammie, MD  diclofenac (VOLTAREN) 75 MG EC tablet Take 1 tablet (75 mg total) by mouth 2 (two) times daily as needed for moderate pain. Take with food. 12/17/16   Alfonse Spruce, FNP  lisinopril (PRINIVIL,ZESTRIL) 40 MG tablet Take 1 tablet (40 mg total) by mouth daily. 04/18/17   Emily Filbert, MD  megestrol (MEGACE) 40 MG tablet Take 1 tablet (40 mg total) by mouth 4 (four) times daily for 12 days. 10/17/17 10/29/17  Zigmund Gottron, NP    Family History No family history on file.  Social History Social History   Tobacco Use  . Smoking status: Never Smoker  . Smokeless tobacco: Never Used  Substance Use Topics  . Alcohol use: Not Currently  Comment: occ.  . Drug use: No     Allergies   Patient has no known allergies.   Review of Systems Review of Systems   Physical Exam Triage Vital Signs ED Triage Vitals  Enc Vitals Group     BP 10/17/17 1119 (!) 203/92     Pulse Rate 10/17/17 1119 81     Resp 10/17/17 1119 16     Temp 10/17/17 1119 98.4 F (36.9 C)     Temp src --      SpO2 10/17/17 1119 100 %     Weight --      Height --      Head Circumference --      Peak Flow --      Pain Score --      Pain Loc --      Pain Edu? --      Excl. in [place]? --    No data found.  Updated Vital Signs BP (!) 195/97   Pulse 81   Temp 98.4 F (36.9 C)   Resp 16   SpO2 100%    Physical Exam  Constitutional: She is oriented to person, place, and time. She appears well-developed and well-nourished. No distress.  Cardiovascular: Normal rate, regular rhythm and normal  heart sounds.  Pulmonary/Chest: Effort normal and breath sounds normal.  Abdominal: Soft. There is no tenderness. There is no rigidity, no rebound, no guarding and no CVA tenderness.  Genitourinary:  Genitourinary Comments: Denies sores, lesions, current vaginal bleeding; no pelvic pain; gu exam deferred at this time, vaginal self swab collected to R/O infectious cause of bleeding.    Neurological: She is alert and oriented to person, place, and time.  Skin: Skin is warm and dry.     UC Treatments / Results  Labs (all labs ordered are listed, but only abnormal results are displayed) Labs Reviewed  CERVICOVAGINAL ANCILLARY ONLY    EKG None  Radiology No results found.  Procedures Procedures (including critical care time)  Medications Ordered in UC Medications - No data to display  Initial Impression / Assessment and Plan / UC Course  I have reviewed the triage vital signs and the nursing notes.  Pertinent labs & imaging results that were available during my care of the patient were reviewed by me and considered in my medical decision making (see chart for details).     Noted HTN. Asymptomatic. Brings in her medications today, does not have amlodipine, it has been prescribed, patient aware and states she is going to to fill this today, she has already spoken to pharmacy about it. Brings megace in today, still has two days worth of pills. [facility]. This is not a new problem. 12 additional days of megace filled at this time. To continue to follow with [facility] for management of DUB. Encouraged recheck of BP after filling amlodipine in the next 1-2 weeks. Return precautions provided. Patient verbalized understanding and agreeable to plan.    Final Clinical Impressions(s) / UC Diagnoses   Final diagnoses:  DUB (dysfunctional uterine bleeding)  Essential hypertension     Discharge Instructions     Please continue with Megace as  prescribed. I have filled enough of your medication to get you to your appointment on 10/14 with the [facility], where your medication will then be managed.  Please fill your Amlodipine as previously prescribed to help with your blood pressure. Take this today. Please take your lisinopril  daily as prescribed.  Please make appointment to follow up with your primary care provider for blood pressure recheck in the next 2 weeks.  If you develop headache, chest pain , dizziness, vision change, abdominal pain or increased bleeding please return to be seen.    ED Prescriptions    Medication Sig Dispense Auth. Provider   megestrol (MEGACE) 40 MG tablet  (Status: Discontinued) Take 1 tablet (40 mg total) by mouth 4 (four) times daily for 12 days. 48 tablet Augusto Gamble B, NP   megestrol (MEGACE) 40 MG tablet Take 1 tablet (40 mg total) by mouth 4 (four) times daily for 12 days. 48 tablet Zigmund Gottron, NP     Controlled Substance Prescriptions [place] Controlled Substance Registry consulted? Not Applicable   Zigmund Gottron, NP 10/17/17 1222

## 2017-10-19 LAB — CERVICOVAGINAL ANCILLARY ONLY
Bacterial vaginitis: NEGATIVE
Candida vaginitis: NEGATIVE
Chlamydia: NEGATIVE
Neisseria Gonorrhea: NEGATIVE
Trichomonas: NEGATIVE

## 2017-10-25 ENCOUNTER — Ambulatory Visit: Payer: Self-pay

## 2017-10-31 ENCOUNTER — Ambulatory Visit (INDEPENDENT_AMBULATORY_CARE_PROVIDER_SITE_OTHER): Payer: Self-pay | Admitting: Obstetrics & Gynecology

## 2017-10-31 ENCOUNTER — Encounter: Payer: Self-pay | Admitting: Obstetrics & Gynecology

## 2017-10-31 VITALS — BP 217/100 | HR 73 | Wt 133.0 lb

## 2017-10-31 DIAGNOSIS — I10 Essential (primary) hypertension: Secondary | ICD-10-CM

## 2017-10-31 DIAGNOSIS — N939 Abnormal uterine and vaginal bleeding, unspecified: Secondary | ICD-10-CM

## 2017-10-31 MED ORDER — HYDROCHLOROTHIAZIDE 25 MG PO TABS: 25.0000 mg | ORAL_TABLET | Freq: Every day | ORAL | 3 refills | Status: DC

## 2017-10-31 MED FILL — MEGESTROL 40 MG TABLET: 40 | 30 days supply | Qty: 120 | Fill #1

## 2017-10-31 NOTE — Progress Notes (Signed)
[employer] Interpreter # [phone]

## 2017-10-31 NOTE — Progress Notes (Signed)
History:  47 y.o. G3P0030 here today for eval of AUB. Pt has been taking Megace 240mg  daily. She is taking 40mg  6x/day.   She reports that sometimes the bleeding just 'runs out.' She denies pain. She has severe range BPs. She denies HA or vision changes. She reports that her BP meds were recently changed.       The following portions of the patient's history were reviewed and updated as appropriate: allergies, current medications, past family history, past medical history, past social history, past surgical history and problem list.  Review of Systems:  Pertinent items are noted in HPI.    Objective:  Physical Exam Blood pressure (!) 217/100, pulse 73, weight 133 lb (60.3 kg).  CONSTITUTIONAL: Well-developed, well-nourished female in no acute distress.  HENT:  Normocephalic, atraumatic EYES: Conjunctivae and EOM are normal. No scleral icterus.  NECK: Normal range of motion SKIN: Skin is warm and dry. No rash noted. Not diaphoretic.No pallor. [place]: Alert and oriented to person, place, and time. Normal coordination.  Lungs: CTA CV: RRR Abd: Soft, nontender and nondistended Pelvic: Normal appearing external genitalia; normal appearing vaginal mucosa and cervix.  Normal discharge.  Small uterus, no other palpable masses, no uterine or adnexal tenderness GU: EGBUS: no lesions Vagina: no blood in vault Cervix: no lesion; no mucopurulent d/c Uterus: enlarged 10-12 weeks sized.  The uterus is mobile. fibroids palpated    Adnexa: no masses; non tender   Labs and Imaging 03/31/2016 PAP wnl neg hrHPV   Assessment & Plan:  AUB- pt taking highn doses of Megace. It is possible that her endometrium is now atrophic. Pt agrees to endometrial ablation.   Patient desires surgical management with hysteroscopy with endometrial ablation.  The risks of surgery were discussed in detail with the patient including but not limited to: bleeding which may require transfusion or reoperation; infection  which may require prolonged hospitalization or re-hospitalization and antibiotic therapy; injury to bowel, bladder, ureters and major vessels or other surrounding organs; need for additional procedures including laparotomy; thromboembolic phenomenon, incisional problems and other postoperative or anesthesia complications.  Patient was told that the likelihood that her condition and symptoms will be treated effectively with this surgical management was very high; the postoperative expectations were also discussed in detail. The patient also understands the alternative treatment options which were discussed in full. All questions were answered.  She was told that she will be contacted by our surgical scheduler regarding the time and date of her surgery; routine preoperative instructions of having nothing to eat or drink after midnight on the day prior to surgery and also coming to the hospital 1 1/2 hours prior to her time of surgery were also emphasized.  She was told she may be called for a preoperative appointment about a week prior to surgery and will be given further preoperative instructions at that visit. Printed patient education handouts about the procedure were given to the patient to review at home.  Severe range uncontrolled HTN  Keep Lisinopril 40mg  daily  Add HCTZ 25 mg daily  TC to pts PC to get immediate appt. She will be seen 10/16 @ 2pm. Pt made aware of  appt date and time  Total face-to-face time with patient was 50 min.  Greater than 50% was spent in counseling and coordination of care with the patient.  Entire visit was conducted with use of video interpreter for [place]. Harraway-Smith, M.D., Cherlynn June

## 2017-11-01 ENCOUNTER — Encounter (HOSPITAL_COMMUNITY): Payer: Self-pay

## 2017-11-01 MED FILL — HYDROCHLOROTHIAZIDE 25 MG T: 25 | 30 days supply | Qty: 30 | Fill #0

## 2017-11-02 ENCOUNTER — Inpatient Hospital Stay: Payer: Self-pay | Admitting: Nurse Practitioner

## 2017-11-03 ENCOUNTER — Ambulatory Visit: Payer: Self-pay | Attending: Family Medicine | Admitting: Family Medicine

## 2017-11-03 ENCOUNTER — Other Ambulatory Visit: Payer: Self-pay

## 2017-11-03 ENCOUNTER — Other Ambulatory Visit: Payer: Self-pay | Admitting: Pharmacist

## 2017-11-03 ENCOUNTER — Encounter: Payer: Self-pay | Admitting: Family Medicine

## 2017-11-03 VITALS — BP 183/87 | HR 87 | Temp 98.8°F | Resp 18 | Ht 62.0 in | Wt 133.2 lb

## 2017-11-03 DIAGNOSIS — I10 Essential (primary) hypertension: Secondary | ICD-10-CM

## 2017-11-03 MED ORDER — HYDROCHLOROTHIAZIDE 25 MG PO TABS: 25.0000 mg | ORAL_TABLET | Freq: Every day | ORAL | 6 refills | Status: DC

## 2017-11-03 MED ORDER — LISINOPRIL 40 MG PO TABS: 40.0000 mg | ORAL_TABLET | Freq: Every day | ORAL | 0 refills | Status: DC

## 2017-11-03 MED ORDER — HYDROCHLOROTHIAZIDE 25 MG PO TABS: 25.0000 mg | ORAL_TABLET | Freq: Every day | ORAL | 0 refills | Status: DC

## 2017-11-03 MED ORDER — LISINOPRIL 40 MG PO TABS: 40.0000 mg | ORAL_TABLET | Freq: Every day | ORAL | 6 refills | Status: DC

## 2017-11-03 MED ORDER — AMLODIPINE BESYLATE 5 MG PO TABS: 5.0000 mg | ORAL_TABLET | Freq: Every day | ORAL | 0 refills | Status: DC

## 2017-11-03 MED ORDER — HYDROCHLOROTHIAZIDE 25 MG PO TABS: 25.0000 mg | ORAL_TABLET | Freq: Every day | ORAL | 3 refills | Status: DC

## 2017-11-03 MED ORDER — AMLODIPINE BESYLATE 5 MG PO TABS: 5.0000 mg | ORAL_TABLET | Freq: Every day | ORAL | 6 refills | Status: DC

## 2017-11-03 MED FILL — AMLODIPINE BESYLATE 5 MG TA: 5 | 30 days supply | Qty: 30 | Fill #0

## 2017-11-03 NOTE — Progress Notes (Signed)
Subjective:    Patient ID: Cindy Garrison, female    DOB: May 15, 1970, 47 y.o.   MRN: [id number]   Due to a language barrier, an attempt was made to obtain an interpreter but patient's native dialect of [place] was not available/ Patient however was also able to converse in [place].  HPI 47 year old female who was last seen in the office on 09/12/2017 at which time, patient's blood pressure was elevated despite her use of lisinopril and amlodipine was added to help control her blood pressure.  Patient is also seen at [facility].  Lab work done at patient's last visit showed a normal hemoglobin A1c of 4.8.  Patient with a complete metabolic panel that was normal but patient had previously had a increase in ALT of 55 on blood work done in March 2019.  Patient is seen at today's visit in follow-up of her hypertension and patient apparently also has had recent ED visit on 10/17/2017 with diagnosis of dysfunctional uterine bleeding.  Patient was referred to GYN where she was recently seen on 10/31/2017 and her blood pressure at that visit was 217/100.  Patient was prescribed hydrochlorothiazide 25 mg in addition to lisinopril 40 mg.  Per her note from the emergency department on 10/17/2017, patient had never picked up or started the amlodipine and it does not appear that patient was on amlodipine when she was seen by OB/GYN.      Patient reports that she thought that she was to continue use of lisinopril until she obtained the amlodipine from pharmacy.  Patient states that she did start the amlodipine but stopped use of lisinopril and had only been on the amlodipine for a few days when she saw the OB/GYN.  Patient states that she was not aware that the OB/GYN had sent in an additional prescription for blood pressure medication.  Patient states that she has had some dull headaches on occasion while switching between medications.  Patient reports that she otherwise feels well.   Past Medical History:    Diagnosis Date  . #[id number]   . Acne   . Back pain   . Essential hypertension   . Seasonal allergies    Past Surgical History:  Procedure Laterality Date  . NO PAST SURGERIES    No Known Allergies  Review of Systems  Constitutional: Positive for fatigue (Occasional mild fatigue). Negative for chills, diaphoresis and fever.  Respiratory: Negative for cough and shortness of breath.   Cardiovascular: Negative for chest pain, palpitations and leg swelling.  Gastrointestinal: Negative for abdominal pain and nausea.  Genitourinary: Positive for frequency. Negative for dysuria.  Musculoskeletal: Negative for arthralgias, back pain, gait problem and joint swelling.  Neurological: Positive for headaches. Negative for dizziness, light-headedness and numbness.       Objective:   Physical Exam BP (!) 183/87   Pulse 87   Temp 98.8 F (37.1 C) (Oral)   Resp 18   Ht 5\' 2"  (1.575 m)   Wt 133 lb 3.2 oz (60.4 kg)   SpO2 100%   BMI 24.36 kg/m  Vital signs and nurse's notes reviewed General-well-nourished, well-developed older female in no acute distress ENT- normal appearance of the TMs, mild edema of the nasal turbinates, normal oropharynx Neck-supple, no lymphadenopathy, no thyromegaly, no carotid bruit Lungs-clear to auscultation bilaterally Cardiovascular-regular rate and rhythm Abdomen-soft, nontender Back-no CVA tenderness Neuro-cranial nerves II through XII grossly intact Extremities-no edema           Assessment & Plan:  1. Uncontrolled hypertension Patient had confusion over medications prescribed at the last visit.  Patient had been on lisinopril but her blood pressure was still elevated therefore new prescription was provided to add amlodipine in addition to her lisinopril however patient was under the impression that she was to stop the lisinopril once she started amlodipine and patient therefore had only been on amlodipine for a few days when she was then seen by OB/GYN  and still had elevated blood pressure therefore OB/GYN added prescription for hydrochlorothiazide.  Patient has not picked up the hydrochlorothiazide and she was not aware that this was sent in by OB/GYN.  I discussed with the patient that she was supposed to be on lisinopril in addition to the amlodipine.  Patient will obtain prescription for lisinopril as well as pick up the prescription for hydrochlorothiazide.  New prescriptions were sent to the patient's pharmacy.  Patient will return in a few weeks to meet with the clinical pharmacist regarding her blood pressure to make sure that it is controlled on the 3 drug regimen.  And it was discussed with the patient several times that I would like for her to take all 3 medications to see if these result in control of her blood pressure.  Patient will also need blood work at a future follow-up to check electrolytes after she has been on new medications for blood pressure.  Patient also requested copies of the prescription so that she would know which medications she was to take.  Patient was given prescriptions with number 1 pill and 0 refill so that she would have the names the medication but it was also expressed to the patient that she would receive an after visit summary today which would also list these medications - amLODipine (NORVASC) 5 MG tablet; Take 1 tablet (5 mg total) by mouth daily.  Dispense: 30 tablet; Refill: 6 - hydrochlorothiazide (HYDRODIURIL) 25 MG tablet; Take 1 tablet (25 mg total) by mouth daily.  Dispense: 30 tablet; Refill: 6 - lisinopril (PRINIVIL,ZESTRIL) 40 MG tablet; Take 1 tablet (40 mg total) by mouth daily. To lower blood pressure  Dispense: 30 tablet; Refill: 6  An After Visit Summary was printed and given to the patient.  Return in about 6 weeks (around 12/15/2017) for BP check/lab in 2 weeks; 4-6 weeks with PCP.

## 2017-11-03 NOTE — Progress Notes (Signed)
Pain: no Patient stated she did take her medications today

## 2017-11-14 ENCOUNTER — Ambulatory Visit: Payer: Self-pay | Attending: Family Medicine | Admitting: Pharmacist

## 2017-11-14 ENCOUNTER — Encounter: Payer: Self-pay | Admitting: Pharmacist

## 2017-11-14 VITALS — BP 129/77 | HR 84

## 2017-11-14 DIAGNOSIS — I10 Essential (primary) hypertension: Secondary | ICD-10-CM

## 2017-11-14 DIAGNOSIS — Z9114 Patient's other noncompliance with medication regimen: Secondary | ICD-10-CM | POA: Insufficient documentation

## 2017-11-14 MED FILL — LISINOPRIL 40 MG TABLET: 40 | 30 days supply | Qty: 30 | Fill #0

## 2017-11-14 NOTE — Patient Instructions (Signed)
Thank you for coming to see [place] today.   Blood pressure today is well-controlled! Great job with taking your medications.  Continue taking all 3 blood pressure medication but start taking them as follows:   - Take 1 tablet of lisinopril 40 mg in the morning.  - Take 1 tablet of hydrochlorothiazide 25 mg in the morning.  - Take 1 tablet of amlodipine 5 mg in the evening.   Limiting salt and caffeine, as well as exercising as able for at least 30 minutes for 5 days out of the week, can also help you lower your blood pressure.  Take your blood pressure at home if you are able. Please write down these numbers and bring them to your visits.  If you have any questions about medications, please call me [phone].  Lurena Joiner

## 2017-11-14 NOTE — Progress Notes (Signed)
   S:    PCP: Dr. Chapman Fitch  Patient arrives in good spirits. Presents to the clinic for hypertension management. Patient was referred and last seen by Dr. Chapman Fitch on 11/03/17.  BP at that visit 183/87. Pt was instructed to take all of her anti-hypertensive medications after reporting non-compliance.  Today, she denies CP, SOB, HA or blurred vision. Denies dizziness or BLE edema.   Patient reports adherence with medications.  Current BP Medications include:   - amlodipine 5 mg daily - HCTZ 25 mg daily - Lisinopril 40 mg daily  Antihypertensives tried in the past include:  - NKDA - Lisinopril-HCTZ combination  Dietary habits include:  - Does not limit salt - Reports cutting back on caffeine but still has occasional soda Exercise habits include: - Denies  Family / Social history:  - FH: DM (mother) - Tobacco: never smoker - Alcohol denies  ASCVD risk: 14%  Home BP readings:  - does not take at home  O:  L arm after 5 minutes rest: 129/77, HR 84 Last 3 Office BP readings: BP Readings from Last 3 Encounters:  11/03/17 (!) 183/87  10/31/17 (!) 217/100  10/17/17 (!) 195/97   BMET    Component Value Date/Time   NA 137 09/12/2017 1016   K 4.0 09/12/2017 1016   CL 104 09/12/2017 1016   CO2 21 09/12/2017 1016   GLUCOSE 97 09/12/2017 1016   GLUCOSE 87 03/17/2017 1105   BUN 6 09/12/2017 1016   CREATININE 0.72 09/12/2017 1016   CREATININE 0.66 02/23/2016 0924   CALCIUM 9.3 09/12/2017 1016   GFRNONAA 99 09/12/2017 1016   GFRNONAA >89 02/23/2016 0924   GFRAA 114 09/12/2017 1016   GFRAA >89 02/23/2016 0924    Renal function: CrCl cannot be calculated (Patient's most recent lab result is older than the maximum 21 days allowed.).  A/P: Hypertension longstanding currently controlled on current medications. BP Goal <130/80 mmHg. Patient is adherent with current medications.  -Continued anti-hypertensives. Pt is to take HCTZ and lisinopril qam with amlodipine qpm. -F/u labs  ordered - CMP14+GFR, lipid panel -Counseled on lifestyle modifications for blood pressure control including reduced dietary sodium, increased exercise, adequate sleep  Results reviewed and written information provided. Total time in face-to-face counseling 20 minutes.   F/U Clinic Visit w/ PCP 12/13/17.    Patient seen with:  Dixon Boos, PharmD Candidate [employer] Class of 2021  Benard Halsted, PharmD, [place] [phone]

## 2017-11-15 LAB — CMP14+EGFR
ALT: 13 IU/L (ref 0–32)
AST: 17 IU/L (ref 0–40)
Albumin/Globulin Ratio: 1.8 (ref 1.2–2.2)
Albumin: 4.9 g/dL (ref 3.5–5.5)
Alkaline Phosphatase: 75 IU/L (ref 39–117)
BUN/Creatinine Ratio: 18 (ref 9–23)
BUN: 12 mg/dL (ref 6–24)
Bilirubin Total: 0.4 mg/dL (ref 0.0–1.2)
CO2: 22 mmol/L (ref 20–29)
Calcium: 9.7 mg/dL (ref 8.7–10.2)
Chloride: 96 mmol/L (ref 96–106)
Creatinine, Ser: 0.68 mg/dL (ref 0.57–1.00)
GFR calc Af Amer: 119 mL/min/1.73
GFR calc non Af Amer: 103 mL/min/1.73
Globulin, Total: 2.8 g/dL (ref 1.5–4.5)
Glucose: 106 mg/dL — ABNORMAL HIGH (ref 65–99)
Potassium: 3.7 mmol/L (ref 3.5–5.2)
Sodium: 134 mmol/L (ref 134–144)
Total Protein: 7.7 g/dL (ref 6.0–8.5)

## 2017-11-15 LAB — LIPID PANEL
Chol/HDL Ratio: 4.4 ratio (ref 0.0–4.4)
Cholesterol, Total: 247 mg/dL — ABNORMAL HIGH (ref 100–199)
HDL: 56 mg/dL
LDL Calculated: 160 mg/dL — ABNORMAL HIGH (ref 0–99)
Triglycerides: 155 mg/dL — ABNORMAL HIGH (ref 0–149)
VLDL Cholesterol Cal: 31 mg/dL (ref 5–40)

## 2017-11-16 ENCOUNTER — Other Ambulatory Visit: Payer: Self-pay | Admitting: Family Medicine

## 2017-11-16 DIAGNOSIS — Z79899 Other long term (current) drug therapy: Secondary | ICD-10-CM

## 2017-11-16 DIAGNOSIS — E7800 Pure hypercholesterolemia, unspecified: Secondary | ICD-10-CM

## 2017-11-16 MED ORDER — ATORVASTATIN CALCIUM 20 MG PO TABS: 20.0000 mg | ORAL_TABLET | Freq: Every day | ORAL | 6 refills | Status: DC

## 2017-11-16 NOTE — Progress Notes (Signed)
Patient ID: Cindy Garrison, female   DOB: [birth year], 47 y.o.   MRN: [id number]   Patient with recent lab work showing elevated LDL of 160.  The clinical pharmacist at this office recommends having patient start atorvastatin 20 mg once daily.  Patient will also be notified to return to clinic for LFTs in 4 to 6 weeks if she decides to start the medication to lower her cholesterol.  Patient will be notified of her lab results and treatment suggestions by CMA.

## 2017-11-21 ENCOUNTER — Telehealth: Payer: Self-pay

## 2017-11-21 NOTE — Telephone Encounter (Signed)
-----   Message from Antony Blackbird, MD sent at 11/15/2017  5:45 PM EDT ----- Please notify patient that her recent blood work showed continued abnormal lipids.  Patient's total cholesterol should be 200 or less and this was elevated at 247.  Patient's LDL or bad cholesterol should be 100 or less and patient's was elevated at 160.  Additionally, patient had CMP done.  I am not sure if she was fasting for this test.  Patient's glucose was 106 and otherwise, CMP was normal.

## 2017-11-21 NOTE — Telephone Encounter (Signed)
Patient was called, no answer, lvm to return call. 

## 2017-11-22 ENCOUNTER — Other Ambulatory Visit: Payer: Self-pay

## 2017-11-22 MED ORDER — MEGESTROL ACETATE 20 MG PO TABS: 40.0000 mg | ORAL_TABLET | Freq: Four times a day (QID) | ORAL | 2 refills | Status: DC

## 2017-11-22 MED FILL — MEGESTROL 20 MG TABLET: 20 | 30 days supply | Qty: 240 | Fill #0

## 2017-11-25 NOTE — Telephone Encounter (Signed)
Pt came in today for a refill on her Megace so that it will last her until her ablation that has been scheduled on 12/22/17.  Notified Dr. Ihor Dow who agreed to refill pt's medication until her ablation.  A paper Rx given to pt so that she can take the prescription to whomever she chooses.

## 2017-12-01 ENCOUNTER — Encounter: Payer: Self-pay | Admitting: *Deleted

## 2017-12-01 MED FILL — HYDROCHLOROTHIAZIDE 25 MG T: 25 | 30 days supply | Qty: 30 | Fill #1

## 2017-12-01 MED FILL — ?AMLODIPINE BESYLATE 5 MG T: 5 | 30 days supply | Qty: 30 | Fill #1

## 2017-12-13 ENCOUNTER — Ambulatory Visit: Payer: Self-pay | Attending: Family Medicine | Admitting: Family Medicine

## 2017-12-13 ENCOUNTER — Encounter: Payer: Self-pay | Admitting: Family Medicine

## 2017-12-13 VITALS — BP 130/81 | HR 80 | Resp 16 | Ht 62.0 in | Wt 138.4 lb

## 2017-12-13 DIAGNOSIS — Z79899 Other long term (current) drug therapy: Secondary | ICD-10-CM

## 2017-12-13 DIAGNOSIS — M778 Other enthesopathies, not elsewhere classified: Secondary | ICD-10-CM

## 2017-12-13 DIAGNOSIS — I10 Essential (primary) hypertension: Secondary | ICD-10-CM

## 2017-12-13 DIAGNOSIS — M25532 Pain in left wrist: Secondary | ICD-10-CM | POA: Insufficient documentation

## 2017-12-13 DIAGNOSIS — M25531 Pain in right wrist: Secondary | ICD-10-CM | POA: Insufficient documentation

## 2017-12-13 DIAGNOSIS — E782 Mixed hyperlipidemia: Secondary | ICD-10-CM

## 2017-12-13 DIAGNOSIS — N938 Other specified abnormal uterine and vaginal bleeding: Secondary | ICD-10-CM | POA: Insufficient documentation

## 2017-12-13 MED ORDER — DICLOFENAC SODIUM 1 % TD GEL: 2.0000 g | Freq: Four times a day (QID) | TRANSDERMAL | 4 refills | Status: DC

## 2017-12-13 MED FILL — DICLOFENAC SODIUM 1% GEL: 1 | 25 days supply | Qty: 200 | Fill #0

## 2017-12-13 MED FILL — LISINOPRIL 40 MG TABLET: 40 | 30 days supply | Qty: 30 | Fill #1

## 2017-12-13 MED FILL — ?ATORVASTATIN 20 MG TABLET: 20 | 30 days supply | Qty: 30 | Fill #0

## 2017-12-13 NOTE — Progress Notes (Signed)
Subjective:    Patient ID: Cindy Garrison, female    DOB: 10-14-1970, 47 y.o.   MRN: [id number]  HPI 47 year old female with history of hypertension, hyperlipidemia and dysfunctional uterine bleeding.  Patient has seen GYN regarding the dysfunctional uterine bleeding.  Patient reports that she did not receive notification about the cholesterol medication therefore she has not yet picked this up from the pharmacy.  At today's visit, patient has complaint of pain in her bilateral wrist.  Patient is unsure if this is related to having blood drawn's done in this area in the past but patient also works in shipping and packing.  Patient does get increased pain with use of her wrist/hands.      Patient reports that she is taking her blood pressure medications daily.  Patient denies any headaches or dizziness related to her blood pressure.  Patient states that other than her wrist pain, she feels well at today's visit.  Patient has had no issues with swelling in her legs and no muscle cramping.  Patient has upcoming appointment with GYN for D&C secondary to dysfunctional uterine bleeding.  Past Medical History:  Diagnosis Date  . #[id number]   . Acne   . Back pain   . Essential hypertension   . Seasonal allergies    Past Surgical History:  Procedure Laterality Date  . NO PAST SURGERIES     Social History   Tobacco Use  . Smoking status: Never Smoker  . Smokeless tobacco: Never Used  Substance Use Topics  . Alcohol use: Not Currently    Comment: occ.  . Drug use: No  No Known Allergies    Review of Systems  Constitutional: Positive for fatigue. Negative for chills and fever.  Respiratory: Negative for cough and shortness of breath.   Gastrointestinal: Negative for abdominal pain and nausea.  Endocrine: Negative for polydipsia, polyphagia and polyuria.  Genitourinary: Negative for dysuria, flank pain and frequency.  Musculoskeletal: Positive for arthralgias. Negative for back pain, gait  problem and joint swelling.  Neurological: Negative for dizziness and headaches.  Hematological: Negative for adenopathy. Does not bruise/bleed easily.       Objective:   Physical Exam BP 130/81 (BP Location: Right Arm, Patient Position: Sitting, Cuff Size: Normal)   Pulse 80   Resp 16   Ht 5\' 2"  (1.575 m)   Wt 138 lb 6.4 oz (62.8 kg)   LMP 12/08/2017 (Approximate)   BMI 25.31 kg/m Nurse's notes and vital signs reviewed General-well-nourished, well-developed, female in no acute distress Neck-supple, no lymphadenopathy, no JVD Lungs-clear to auscultation bilaterally Cardiovascular-regular rate and rhythm Abdomen-soft, nontender Back-no CVA tenderness Extremities-no edema Musculoskeletal- no patient with tenderness over the bilateral wrist below the thumbs and has increased discomfort with flexion/extension of the tendon over this area.  No edema, no increased warmth and no erythema       Assessment & Plan:  1. Essential hypertension Patient's blood pressure is well controlled at today's visit.  Patient will continue amlodipine 5 mg, hydrochlorothiazide 25 mg and lisinopril 40 mg daily.  Patient will return for CMP in follow-up of medication use.  Patient also with slightly elevated glucose on her most recent CMP and hemoglobin A1c can increase her risk of onset of diabetes.  Patient will have hemoglobin A1c with upcoming labs. - Comprehensive metabolic panel; Future - Hemoglobin A1c; Future  2. Mixed hyperlipidemia Patient is status post lipid panel done on 11/14/2017 with total cholesterol elevated at 247, triglycerides elevated at 155 and  LDL elevated at 160.  Prescription was sent to patient's pharmacy for atorvastatin but patient states that she never received notification of the labs and medication therefore she will pick up cholesterol medication from pharmacy after today's visit.  Patient will return for CMP in approximately 6 weeks after starting cholesterol medication to  recheck electrolytes and liver function.  Patient was made aware that if she has increased muscle aches after starting the cholesterol medication she should also contact the clinic - Comprehensive metabolic panel; Future  3. Tendinitis of both wrists Based on exam, patient likely has de Quervain's tenosynovitis.  Prescription provided for Voltaren gel to apply up to 4 times daily to the wrist as needed for pain.  Did not prescribe oral nonsteroidal anti-inflammatories as patient with dysfunctional uterine bleeding and scheduled for upcoming D&C.  Patient was made aware that an order would also be placed for x-rays of her wrist if she continues to have pain.  Patient should also call or return to clinic if she has no improvement in her wrist pain or any worsening of her wrist pain.  Patient may require orthopedic referral - DG Wrist Complete Left; Future - DG Wrist Complete Right; Future - diclofenac sodium (VOLTAREN) 1 % GEL; Apply 2 g topically 4 (four) times daily. As needed for pain relief  Dispense: 2 Tube; Refill: 4  4. Encounter for long-term current use of medication Patient will return in approximately 6 weeks for labs in follow-up of long-term use of medication and status post new start of statin medication, atorvastatin. - Comprehensive metabolic panel; Future - Hemoglobin A1c; Future  An After Visit Summary was printed and given to the patient.  Return in about 3 months (around 03/15/2018) for blood pressure f/u; 6 weeks for labs.

## 2017-12-14 NOTE — Pre-Procedure Instructions (Signed)
Cindy Garrison  12/14/2017      [facility] [place] [address] Phone: [phone] Fax: [phone]    Your procedure is scheduled on Thursday December 5.  Report to the Main Entrance at the  [facility] at 10:00 A.M.  Pick up the Phone at the desk and dial [phone]  Call this number if you have problems the morning of surgery:  [phone]   Remember:  Do not eat or drink after midnight.    Take these medicines the morning of surgery with A SIP OF WATER:   Amlodipine (norvasc) Acetaminophen (tylenol) if needed  7 days prior to surgery STOP taking any Aspirin(unless otherwise instructed by your surgeon), diclofenac (Voltaren), Aleve, Naproxen, Ibuprofen, Motrin, Advil, Goody's, BC's, all herbal medications, fish oil, and all vitamins  Do not smoke the day of surgery  Bring inhaler with you the day of surgery  Brush your teeth the morning of surgery    Do not wear jewelry, piercings, make-up or nail polish.  Do not wear lotions, powders, or perfumes, or deodorant.  Do not shave 48 hours prior to surgery.  Men may shave face and neck.  Do not bring valuables to the hospital.  [facility] is not responsible for any belongings or valuables.  Contacts, dentures or bridgework may not be worn into surgery.  Leave your suitcase in the car.  After surgery it may be brought to your room.  For patients admitted to the hospital, checkout time is at 11AM the day of discharge.  Patients discharged the day of surgery will not be allowed to drive home.   Special instructions:    [facility]- Preparing For Surgery  Before surgery, you can play an important role. Because skin is not sterile, your skin needs to be as free of germs as possible. You can reduce the number of germs on your skin by washing with CHG (chlorahexidine gluconate) Soap before surgery.  CHG is an antiseptic cleaner which kills  germs and bonds with the skin to continue killing germs even after washing.    Oral Hygiene is also important to reduce your risk of infection.  Remember - BRUSH YOUR TEETH THE MORNING OF SURGERY WITH YOUR REGULAR TOOTHPASTE  Please do not use if you have an allergy to CHG or antibacterial soaps. If your skin becomes reddened/irritated stop using the CHG.  Do not shave (including legs and underarms) for at least 48 hours prior to first CHG shower. It is OK to shave your face.  Please follow these instructions carefully.   1. Shower the NIGHT BEFORE SURGERY and the MORNING OF SURGERY with CHG.   2. If you chose to wash your hair, wash your hair first as usual with your normal shampoo.  3. After you shampoo, rinse your hair and body thoroughly to remove the shampoo.  4. Use CHG as you would any other liquid soap. You can apply CHG directly to the skin and wash gently with a scrungie or a clean washcloth.   5. Apply the CHG Soap to your body ONLY FROM THE NECK DOWN.  Do not use on open wounds or open sores. Avoid contact with your eyes, ears, mouth and genitals (private parts). Wash Face and genitals (private parts)  with your normal soap.  6. Wash thoroughly, paying special attention to the area where your surgery will be performed.  7. Thoroughly rinse your body  with warm water from the neck down.  8. DO NOT shower/wash with your normal soap after using and rinsing off the CHG Soap.  9. Pat yourself dry with a CLEAN TOWEL.  10. Wear CLEAN PAJAMAS to bed the night before surgery, wear comfortable clothes the morning of surgery  11. Place CLEAN SHEETS on your bed the night of your first shower and DO NOT SLEEP WITH PETS.    Day of Surgery:  Do not apply any deodorants/lotions.  Please wear clean clothes to the hospital/surgery center.   Remember to brush your teeth WITH YOUR REGULAR TOOTHPASTE.    Please read over the following fact sheets that you were given. Coughing and Deep  Breathing, MRSA Information and Surgical Site Infection Prevention

## 2017-12-16 ENCOUNTER — Encounter (HOSPITAL_COMMUNITY)
Admission: RE | Admit: 2017-12-16 | Discharge: 2017-12-16 | Disposition: A | Discharge status: Home / Self Care | Payer: Self-pay | Source: Ambulatory Visit | Attending: Obstetrics & Gynecology | Admitting: Obstetrics & Gynecology

## 2017-12-16 ENCOUNTER — Encounter (HOSPITAL_COMMUNITY): Payer: Self-pay

## 2017-12-16 ENCOUNTER — Other Ambulatory Visit: Payer: Self-pay

## 2017-12-16 DIAGNOSIS — Z01812 Encounter for preprocedural laboratory examination: Secondary | ICD-10-CM | POA: Insufficient documentation

## 2017-12-16 LAB — CBC
HCT: 40.6 % (ref 36.0–46.0)
HEMOGLOBIN: 13.6 g/dL (ref 12.0–15.0)
MCH: 25.5 pg — ABNORMAL LOW (ref 26.0–34.0)
MCHC: 33.5 g/dL (ref 30.0–36.0)
MCV: 76.2 fL — ABNORMAL LOW (ref 80.0–100.0)
Platelets: 277 10*3/uL (ref 150–400)
RBC: 5.33 MIL/uL — ABNORMAL HIGH (ref 3.87–5.11)
RDW: 16.2 % — ABNORMAL HIGH (ref 11.5–15.5)
WBC: 7.2 10*3/uL (ref 4.0–10.5)
nRBC: 0 % (ref 0.0–0.2)

## 2017-12-16 LAB — BASIC METABOLIC PANEL
ANION GAP: 10 (ref 5–15)
BUN: 11 mg/dL (ref 6–20)
CO2: 25 mmol/L (ref 22–32)
Calcium: 9 mg/dL (ref 8.9–10.3)
Chloride: 101 mmol/L (ref 98–111)
Creatinine, Ser: 0.73 mg/dL (ref 0.44–1.00)
GFR calc Af Amer: >60 mL/min (ref >60)
GFR calc non Af Amer: >60 mL/min (ref >60)
Glucose, Bld: 82 mg/dL (ref 70–99)
POTASSIUM: 2.9 mmol/L — AB (ref 3.5–5.1)
Sodium: 136 mmol/L (ref 135–145)

## 2017-12-16 NOTE — Progress Notes (Signed)
Pt's K+ resulted at 2.9. Multiple attempts made to get in contact with someone from Dr. Vladimir Creeks office/practice. Detailed voicemail left at [facility] on the voicemail for Nurses. Call back number left for any clarification.   Jacqlyn Larsen, RN

## 2017-12-16 NOTE — Progress Notes (Signed)
PCP - Antony Blackbird, MD Cardiologist - denies  Chest x-ray - N/A EKG - 03/18/17 Stress Test -denies  ECHO - 09/18/14 Cardiac Cath - denies  Sleep Study - denies  Aspirin Instructions: N/A  Anesthesia review: No  Patient denies shortness of breath, fever, cough and chest pain at PAT appointment  [employer] services used for pre-op appointment. Pt speaks very little [place]. Accompanied by husband who also speaks very little [place]. Will need a [place] interpreter on DOS.  Patient verbalized understanding of instructions that were given to them at the PAT appointment. Patient was also instructed that they will need to review over the PAT instructions again at home before surgery.

## 2017-12-19 ENCOUNTER — Telehealth: Payer: Self-pay

## 2017-12-19 ENCOUNTER — Telehealth: Payer: Self-pay | Admitting: *Deleted

## 2017-12-19 ENCOUNTER — Other Ambulatory Visit: Payer: Self-pay | Admitting: Obstetrics & Gynecology

## 2017-12-19 MED ORDER — POTASSIUM CHLORIDE CRYS ER 20 MEQ PO TBCR: 40.0000 meq | EXTENDED_RELEASE_TABLET | Freq: Three times a day (TID) | ORAL | 0 refills | Status: DC

## 2017-12-19 MED FILL — POTASSIUM CL ER 20 MEQ TAB: 20 | 2 days supply | Qty: 12 | Fill #0

## 2017-12-19 NOTE — Telephone Encounter (Signed)
Received message from [place] to call patient and tell her that her potassium is very low and she needs to begin potassium asap to get her potassium to appropriate level for surgery Thursday.  RX is at pharmacy.  I Called patient with [facility] [phone]- left message on voicemail we are trying to reach you with important information - please call our office asap and ask to speak with nurse.  I also called her contact number ( brother ) and left message with interpreter we are trying to reach [place]- please have her call office.

## 2017-12-19 NOTE — Progress Notes (Signed)
Spoke with [place], [place], regarding voice message left for K+ 2.9; Rosario Adie stated that she would make MD aware.

## 2017-12-19 NOTE — Telephone Encounter (Signed)
Received a voice mail from 12/16/17 1:25 pm from Barbie Haggis, Nurse at [facility]. States they saw this patient of Dr. Vincent Peyer - is scheduled for surgery 12/22/17 - they did labs and her potassium is 2.9- wanted to make someone aware. May be called back at [phone] or surgery line [phone].

## 2017-12-19 NOTE — Telephone Encounter (Signed)
Ester, RN from [employer] [employer] called stating that pt's potassium was 2.9. Message was sent to Dr. Ihor Dow, MD.

## 2017-12-19 NOTE — Telephone Encounter (Signed)
Earlee brother called back - he speaks [place] - and note on chart states to call him with appointments, etc.  I asked if he had another number for her which he states no.  I informed him to please tell her we have sent in rx to her pharmacy for her to pick up and start today for potassium . I explained her level was low and she needs to take this starting today to get her level normal before Thursday.  He states he will try to pick up rx today and will tell her.I asked him if she has questions to please call us back.

## 2017-12-22 ENCOUNTER — Ambulatory Visit (HOSPITAL_COMMUNITY): Payer: Self-pay | Admitting: Anesthesiology

## 2017-12-22 ENCOUNTER — Ambulatory Visit (HOSPITAL_COMMUNITY): Payer: Self-pay | Admitting: Emergency Medicine

## 2017-12-22 ENCOUNTER — Encounter (HOSPITAL_COMMUNITY): Payer: Self-pay | Admitting: Anesthesiology

## 2017-12-22 ENCOUNTER — Encounter (HOSPITAL_COMMUNITY)
Admission: RE | Disposition: A | Discharge status: Home / Self Care | Payer: Self-pay | Source: Ambulatory Visit | Attending: Obstetrics & Gynecology

## 2017-12-22 ENCOUNTER — Other Ambulatory Visit: Payer: Self-pay

## 2017-12-22 ENCOUNTER — Ambulatory Visit (HOSPITAL_COMMUNITY)
Admission: RE | Admit: 2017-12-22 | Discharge: 2017-12-22 | Disposition: A | Discharge status: Home / Self Care | Payer: Self-pay | Source: Ambulatory Visit | Attending: Obstetrics & Gynecology | Admitting: Obstetrics & Gynecology

## 2017-12-22 DIAGNOSIS — D259 Leiomyoma of uterus, unspecified: Secondary | ICD-10-CM | POA: Diagnosis present

## 2017-12-22 DIAGNOSIS — Z79899 Other long term (current) drug therapy: Secondary | ICD-10-CM | POA: Insufficient documentation

## 2017-12-22 DIAGNOSIS — I10 Essential (primary) hypertension: Secondary | ICD-10-CM | POA: Insufficient documentation

## 2017-12-22 DIAGNOSIS — N938 Other specified abnormal uterine and vaginal bleeding: Secondary | ICD-10-CM | POA: Diagnosis present

## 2017-12-22 DIAGNOSIS — D500 Iron deficiency anemia secondary to blood loss (chronic): Secondary | ICD-10-CM

## 2017-12-22 DIAGNOSIS — F259 Schizoaffective disorder, unspecified: Secondary | ICD-10-CM | POA: Insufficient documentation

## 2017-12-22 DIAGNOSIS — D509 Iron deficiency anemia, unspecified: Secondary | ICD-10-CM | POA: Insufficient documentation

## 2017-12-22 DIAGNOSIS — M549 Dorsalgia, unspecified: Secondary | ICD-10-CM | POA: Insufficient documentation

## 2017-12-22 DIAGNOSIS — R9389 Abnormal findings on diagnostic imaging of other specified body structures: Secondary | ICD-10-CM | POA: Insufficient documentation

## 2017-12-22 DIAGNOSIS — D250 Submucous leiomyoma of uterus: Secondary | ICD-10-CM

## 2017-12-22 DIAGNOSIS — D649 Anemia, unspecified: Secondary | ICD-10-CM | POA: Diagnosis present

## 2017-12-22 DIAGNOSIS — R55 Syncope and collapse: Secondary | ICD-10-CM | POA: Insufficient documentation

## 2017-12-22 HISTORY — PX: DILITATION & CURRETTAGE/HYSTROSCOPY WITH NOVASURE ABLATION: SHX5568

## 2017-12-22 LAB — PREGNANCY, URINE: Preg Test, Ur: NEGATIVE

## 2017-12-22 LAB — POTASSIUM: Potassium: 3.8 mmol/L (ref 3.5–5.1)

## 2017-12-22 SURGERY — DILATATION & CURETTAGE/HYSTEROSCOPY WITH NOVASURE ABLATION
Anesthesia: General | Site: Vagina

## 2017-12-22 MED ORDER — DEXAMETHASONE SODIUM PHOSPHATE 10 MG/ML IJ SOLN
INTRAMUSCULAR | Status: AC
  Filled 2017-12-22: qty 1

## 2017-12-22 MED ORDER — DEXAMETHASONE SODIUM PHOSPHATE 4 MG/ML IJ SOLN
INTRAMUSCULAR | Status: DC | PRN
  Administered 2017-12-22: 4 mg via INTRAVENOUS

## 2017-12-22 MED ORDER — FENTANYL CITRATE (PF) 250 MCG/5ML IJ SOLN
INTRAMUSCULAR | Status: DC | PRN
  Administered 2017-12-22: 50 ug via INTRAVENOUS

## 2017-12-22 MED ORDER — OXYCODONE HCL 5 MG PO TABS: 5.0000 mg | ORAL_TABLET | Freq: Once | ORAL | Status: DC | PRN

## 2017-12-22 MED ORDER — HYDROMORPHONE HCL 1 MG/ML IJ SOLN: 0.2500 mg | INTRAMUSCULAR | Status: DC | PRN

## 2017-12-22 MED ORDER — PROPOFOL 10 MG/ML IV BOLUS
INTRAVENOUS | Status: AC
  Filled 2017-12-22: qty 20

## 2017-12-22 MED ORDER — PROMETHAZINE HCL 25 MG/ML IJ SOLN: 6.2500 mg | INTRAMUSCULAR | Status: DC | PRN

## 2017-12-22 MED ORDER — BUPIVACAINE HCL (PF) 0.5 % IJ SOLN
INTRAMUSCULAR | Status: AC
  Filled 2017-12-22: qty 30

## 2017-12-22 MED ORDER — SCOPOLAMINE 1 MG/3DAYS TD PT72
1.0000 | MEDICATED_PATCH | Freq: Once | TRANSDERMAL | Status: DC
  Administered 2017-12-22: 1.5 mg via TRANSDERMAL

## 2017-12-22 MED ORDER — GLYCOPYRROLATE 0.2 MG/ML IJ SOLN
INTRAMUSCULAR | Status: DC | PRN
  Administered 2017-12-22: 0.1 mg via INTRAVENOUS

## 2017-12-22 MED ORDER — LIDOCAINE HCL (CARDIAC) PF 100 MG/5ML IV SOSY
PREFILLED_SYRINGE | INTRAVENOUS | Status: DC | PRN
  Administered 2017-12-22: 100 mg via INTRAVENOUS

## 2017-12-22 MED ORDER — LIDOCAINE HCL (CARDIAC) PF 100 MG/5ML IV SOSY
PREFILLED_SYRINGE | INTRAVENOUS | Status: AC
  Filled 2017-12-22: qty 5

## 2017-12-22 MED ORDER — PHENYLEPHRINE HCL 10 MG/ML IJ SOLN
INTRAMUSCULAR | Status: DC | PRN
  Administered 2017-12-22: 80 ug via INTRAVENOUS

## 2017-12-22 MED ORDER — PROPOFOL 10 MG/ML IV BOLUS
INTRAVENOUS | Status: DC | PRN
  Administered 2017-12-22: 180 mg via INTRAVENOUS

## 2017-12-22 MED ORDER — MEPERIDINE HCL 25 MG/ML IJ SOLN: 6.2500 mg | INTRAMUSCULAR | Status: DC | PRN

## 2017-12-22 MED ORDER — ONDANSETRON HCL 4 MG/2ML IJ SOLN
INTRAMUSCULAR | Status: DC | PRN
  Administered 2017-12-22: 4 mg via INTRAVENOUS

## 2017-12-22 MED ORDER — OXYCODONE HCL 5 MG/5ML PO SOLN: 5.0000 mg | Freq: Once | ORAL | Status: DC | PRN

## 2017-12-22 MED ORDER — SODIUM CHLORIDE 0.9 % IR SOLN
Status: DC | PRN
  Administered 2017-12-22: 3000 mL

## 2017-12-22 MED ORDER — LACTATED RINGERS IV SOLN: INTRAVENOUS | Status: DC

## 2017-12-22 MED ORDER — MIDAZOLAM HCL 2 MG/2ML IJ SOLN
INTRAMUSCULAR | Status: DC | PRN
  Administered 2017-12-22: 1 mg via INTRAVENOUS

## 2017-12-22 MED ORDER — FENTANYL CITRATE (PF) 100 MCG/2ML IJ SOLN
INTRAMUSCULAR | Status: AC
  Filled 2017-12-22: qty 2

## 2017-12-22 MED ORDER — MIDAZOLAM HCL 2 MG/2ML IJ SOLN
INTRAMUSCULAR | Status: AC
  Filled 2017-12-22: qty 2

## 2017-12-22 MED ORDER — SCOPOLAMINE 1 MG/3DAYS TD PT72
MEDICATED_PATCH | TRANSDERMAL | Status: AC
  Administered 2017-12-22: 1.5 mg via TRANSDERMAL
  Filled 2017-12-22: qty 1

## 2017-12-22 MED ORDER — LACTATED RINGERS IV SOLN
INTRAVENOUS | Status: DC
  Administered 2017-12-22: 12:00:00 via INTRAVENOUS
  Administered 2017-12-22: 125 mL/h via INTRAVENOUS

## 2017-12-22 MED ORDER — BUPIVACAINE HCL (PF) 0.5 % IJ SOLN
INTRAMUSCULAR | Status: DC | PRN
  Administered 2017-12-22: 20 mL

## 2017-12-22 MED ORDER — PHENYLEPHRINE 40 MCG/ML (10ML) SYRINGE FOR IV PUSH (FOR BLOOD PRESSURE SUPPORT)
PREFILLED_SYRINGE | INTRAVENOUS | Status: AC
  Filled 2017-12-22: qty 10

## 2017-12-22 MED FILL — MEGESTROL 20 MG TABLET: 20 | 30 days supply | Qty: 240 | Fill #1

## 2017-12-22 SURGICAL SUPPLY — 12 items
ABLATOR SURESOUND NOVASURE (ABLATOR) ×2 IMPLANT
CATH ROBINSON RED A/P 16FR (CATHETERS) ×2 IMPLANT
DEVICE MYOSURE REACH (MISCELLANEOUS) ×2 IMPLANT
GLOVE BIO SURGEON STRL SZ7 (GLOVE) ×2 IMPLANT
GLOVE BIOGEL PI IND STRL 7.0 (GLOVE) ×2 IMPLANT
GLOVE BIOGEL PI INDICATOR 7.0 (GLOVE) ×2
GOWN STRL REUS W/TWL LRG LVL3 (GOWN DISPOSABLE) ×4 IMPLANT
GOWN STRL REUS W/TWL XL LVL3 (GOWN DISPOSABLE) ×2 IMPLANT
KIT PROCEDURE FLUENT (KITS) ×2 IMPLANT
PACK VAGINAL MINOR WOMEN LF (CUSTOM PROCEDURE TRAY) ×2 IMPLANT
PAD OB MATERNITY 4.3X12.25 (PERSONAL CARE ITEMS) ×2 IMPLANT
TOWEL OR 17X24 6PK STRL BLUE (TOWEL DISPOSABLE) ×4 IMPLANT

## 2017-12-22 NOTE — Op Note (Signed)
12/22/2017  12:53 PM  PATIENT:  Cindy Garrison  47 y.o. female  PRE-OPERATIVE DIAGNOSIS:  AUB  POST-OPERATIVE DIAGNOSIS:  AUB, uterine fibroids  PROCEDURE:  Procedure(s): DILATATION & CURETTAGE/ HYSTEROSCOPY WITH ENDOMETRIAL FIBROID AND  NOVASURE ABLATION (N/A);   SURGEON:  Surgeon(s) and Role:    * Lavonia Drafts, MD - Primary  ANESTHESIA:   general  EBL: minimal  BLOOD ADMINISTERED:none  DRAINS: none   LOCAL MEDICATIONS USED:  MARCAINE     SPECIMEN:  Source of Specimen:  fibroids segments and endometrila currettings    DISPOSITION OF SPECIMEN:  PATHOLOGY  COUNTS:  YES  TOURNIQUET:  * No tourniquets in log *  DICTATION: .Note written in EPIC  PLAN OF CARE: Discharge to home after PACU  PATIENT DISPOSITION:  PACU - hemodynamically stable.   Delay start of Pharmacological VTE agent (>24hrs) due to surgical blood loss or risk of bleeding: not applicable  Indications: AUB not responsive to medical management .   PROCEDURE DETAILS:  The patient was taken to the operating room where general anesthesia was administered and was found to be adequate.  After an adequate timeout was performed, she was placed in the dorsal lithotomy position and examined; then prepped and draped in the sterile manner.   Her bladder was catheterized for an unmeasured amount of clear, yellow urine. A speculum was then placed in the patient's vagina and a single tooth tenaculum was applied to the anterior lip of the cervix.   A paracervical block using 20 ml of 0.5% Marcaine was administered.  The cervix was sounded to 11 cm and dilated manually with metal dilators to accommodate the Myosure operative hysteroscope.  Once the cervix was dilated, the hysteroscope was inserted under direct visualization using LR as a suspension medium.  The uterine cavity was carefully examined, both ostia were recognized, and diffusely proliferative endometrium with a fibroid located  posteriorly in the lower uterine segment. A sharp curettage was then performed to obtain a moderate amount of endometrial curettings.  The fibroid was resected using the Myosure device.  After further careful visualization of the uterine cavity, the hysteroscope was removed under direct visualization. The NovaSure device was then inserted and seated using 6.5cm as the cavity length and 3.0cm as the cavity width.  The device was activated and the burn time was 90 sec. Marland Kitchen  The hysteroscope was reinserted and an even burn pattern was noted to the fundus.  The single toothed tenaculum was removed at the end of the case and no bleeding was noted from the cervix.  The patient tolerated the procedure well and was taken to the recovery area awake, extubated and in stable condition.  Lenford Beddow L. Harraway-Smith, M.D., [facility]

## 2017-12-22 NOTE — Anesthesia Preprocedure Evaluation (Signed)
Anesthesia Evaluation  Patient identified by MRN, date of birth, ID band Patient awake    Reviewed: Allergy & Precautions, NPO status , Patient's Chart, lab work & pertinent test results  Airway Mallampati: II  TM Distance: >3 FB Neck ROM: Full    Dental no notable dental hx.    Pulmonary neg pulmonary ROS,    Pulmonary exam normal breath sounds clear to auscultation       Cardiovascular hypertension, Pt. on medications negative cardio ROS Normal cardiovascular exam Rhythm:Regular Rate:Normal     Neuro/Psych negative neurological ROS  negative psych ROS   GI/Hepatic negative GI ROS, Neg liver ROS,   Endo/Other  negative endocrine ROS  Renal/GU negative Renal ROS  negative genitourinary   Musculoskeletal negative musculoskeletal ROS (+)   Abdominal   Peds negative pediatric ROS (+)  Hematology negative hematology ROS (+)   Anesthesia Other Findings   Reproductive/Obstetrics negative OB ROS                             Anesthesia Physical Anesthesia Plan  ASA: II  Anesthesia Plan: General   Post-op Pain Management:    Induction: Intravenous  PONV Risk Score and Plan: 3 and Ondansetron, Dexamethasone and Midazolam  Airway Management Planned: LMA  Additional Equipment:   Intra-op Plan:   Post-operative Plan: Extubation in OR  Informed Consent: I have reviewed the patients History and Physical, chart, labs and discussed the procedure including the risks, benefits and alternatives for the proposed anesthesia with the patient or authorized representative who has indicated his/her understanding and acceptance.   Dental advisory given  Plan Discussed with: CRNA  Anesthesia Plan Comments:         Anesthesia Quick Evaluation

## 2017-12-22 NOTE — Transfer of Care (Signed)
Immediate Anesthesia Transfer of Care Note  Patient: Cindy Garrison  Procedure(s) Performed: DILATATION & CURETTAGE/ HYSTEROSCOPY WITH ENDOMETRIAL FIBROID AND  NOVASURE ABLATION (N/A Vagina )  Patient Location: PACU  Anesthesia Type:General  Level of Consciousness: awake, alert  and drowsy  Airway & Oxygen Therapy: Patient Spontanous Breathing and Patient connected to nasal cannula oxygen  Post-op Assessment: Report given to RN and Post -op Vital signs reviewed and stable  Post vital signs: Reviewed and stable  Last Vitals:  Vitals Value Taken Time  BP 146/86 12/22/2017  1:30 PM  Temp    Pulse 87 12/22/2017  1:31 PM  Resp 17 12/22/2017  1:31 PM  SpO2 98 % 12/22/2017  1:31 PM  Vitals shown include unvalidated device data.  Last Pain:  Vitals:   12/22/17 1315  TempSrc:   PainSc: 0-No pain      Patients Stated Pain Goal: 2 (54/49/20 1007)  Complications: No apparent anesthesia complications

## 2017-12-22 NOTE — Anesthesia Procedure Notes (Signed)
Procedure Name: LMA Insertion Date/Time: 12/22/2017 11:53 AM Performed by: Flossie Dibble, CRNA Pre-anesthesia Checklist: Patient identified, Patient being monitored, Emergency Drugs available, Timeout performed and Suction available Patient Re-evaluated:Patient Re-evaluated prior to induction Oxygen Delivery Method: Circle System Utilized Preoxygenation: Pre-oxygenation with 100% oxygen Induction Type: IV induction Ventilation: Mask ventilation without difficulty LMA: LMA inserted LMA Size: 4.0 Number of attempts: 1 Placement Confirmation: positive ETCO2 and breath sounds checked- equal and bilateral Tube secured with: Tape Dental Injury: Teeth and Oropharynx as per pre-operative assessment

## 2017-12-22 NOTE — Discharge Instructions (Signed)
Hysteroscopy, Care After Refer to this sheet in the next few weeks. These instructions provide you with information on caring for yourself after your procedure. Your health care provider may also give you more specific instructions. Your treatment has been planned according to current medical practices, but problems sometimes occur. Call your health care provider if you have any problems or questions after your procedure. What can I expect after the procedure? After your procedure, it is typical to have the following:  You may have some cramping. This normally lasts for a couple days.  You may have bleeding. This can vary from light spotting for a few days to menstrual-like bleeding for 3-7 days.  Follow these instructions at home:  Rest for the first 1-2 days after the procedure.  Only take over-the-counter or prescription medicines as directed by your health care provider. Do not take aspirin. It can increase the chances of bleeding.  Take showers instead of baths for 2 weeks or as directed by your health care provider.  Do not drive for 24 hours or as directed.  Do not drink alcohol while taking pain medicine.  Do not use tampons, douche, or have sexual intercourse for 2 weeks or until your health care provider says it is okay.  Take your temperature twice a day for 4-5 days. Write it down each time.  Follow your health care provider's advice about diet, exercise, and lifting.  If you develop constipation, you may: ? Take a mild laxative if your health care provider approves. ? Add bran foods to your diet. ? Drink enough fluids to keep your urine clear or pale yellow.  Try to have someone with you or available to you for the first 24-48 hours, especially if you were given a general anesthetic.  Follow up with your health care provider as directed. Contact a health care provider if:  You feel dizzy or lightheaded.  You feel sick to your stomach (nauseous).  You have  abnormal vaginal discharge.  You have a rash.  You have pain that is not controlled with medicine. Get help right away if:  You have bleeding that is heavier than a normal menstrual period.  You have a fever.  You have increasing cramps or pain, not controlled with medicine.  You have new belly (abdominal) pain.  You pass out.  You have pain in the tops of your shoulders (shoulder strap areas).  You have shortness of breath. This information is not intended to replace advice given to you by your health care provider. Make sure you discuss any questions you have with your health care provider. Document Released: 10/25/2012 Document Revised: 06/12/2015 Document Reviewed: 08/03/2012 Elsevier Interactive Patient Education  2017 [facility] Instructions  Activity: Get plenty of rest for the remainder of the day. A responsible individual must stay with you for 24 hours following the procedure.  For the next 24 hours, DO NOT: -Drive a car -Paediatric nurse -Drink alcoholic beverages -Take any medication unless instructed by your physician -Make any legal decisions or sign important papers.  Meals: Start with liquid foods such as gelatin or soup. Progress to regular foods as tolerated. Avoid greasy, spicy, heavy foods. If nausea and/or vomiting occur, drink only clear liquids until the nausea and/or vomiting subsides. Call your physician if vomiting continues.  Special Instructions/Symptoms: Your throat may feel dry or sore from the anesthesia or the breathing tube placed in your throat during surgery. If this causes discomfort, gargle  with warm salt water. The discomfort should disappear within 24 hours. ° °If you had a scopolamine patch placed behind your ear for the management of post- operative nausea and/or vomiting: ° °1. The medication in the patch is effective for 72 hours, after which it should be removed.  Wrap patch in a tissue and discard in  the trash. Wash hands thoroughly with soap and water. °2. You may remove the patch earlier than 72 hours if you experience unpleasant side effects which may include dry mouth, dizziness or visual disturbances. °3. Avoid touching the patch. Wash your hands with soap and water after contact with the patch. °  ° ° °

## 2017-12-22 NOTE — Anesthesia Postprocedure Evaluation (Signed)
Anesthesia Post Note  Patient: Louis Matte Dewald  Procedure(s) Performed: DILATATION & CURETTAGE/ HYSTEROSCOPY WITH ENDOMETRIAL FIBROID AND  NOVASURE ABLATION (N/A Vagina )     Patient location during evaluation: PACU Anesthesia Type: General Level of consciousness: awake and alert Pain management: pain level controlled Vital Signs Assessment: post-procedure vital signs reviewed and stable Respiratory status: spontaneous breathing, nonlabored ventilation and respiratory function stable Cardiovascular status: blood pressure returned to baseline and stable Postop Assessment: no apparent nausea or vomiting Anesthetic complications: no    Last Vitals:  Vitals:   12/22/17 1315 12/22/17 1330  BP: (!) 145/86 (!) 146/86  Pulse: 91 87  Resp: 18 17  Temp:    SpO2: 98% 98%    Last Pain:  Vitals:   12/22/17 1345  TempSrc:   PainSc: 0-No pain   Pain Goal: Patients Stated Pain Goal: 2 (12/22/17 1046)               Lynda Rainwater

## 2017-12-22 NOTE — Brief Op Note (Signed)
12/22/2017  12:53 PM  PATIENT:  Cindy Garrison  47 y.o. female  PRE-OPERATIVE DIAGNOSIS:  AUB  POST-OPERATIVE DIAGNOSIS:  AUB, uterine fibroids  PROCEDURE:  Procedure(s): DILATATION & CURETTAGE/ HYSTEROSCOPY WITH ENDOMETRIAL FIBROID AND  NOVASURE ABLATION (N/A);   SURGEON:  Surgeon(s) and Role:    * Lavonia Drafts, MD - Primary  ANESTHESIA:   general  EBL: minimal  BLOOD ADMINISTERED:none  DRAINS: none   LOCAL MEDICATIONS USED:  MARCAINE     SPECIMEN:  Source of Specimen:  fibroids segments and endometrila currettings    DISPOSITION OF SPECIMEN:  PATHOLOGY  COUNTS:  YES  TOURNIQUET:  * No tourniquets in log *  DICTATION: .Note written in EPIC  PLAN OF CARE: Discharge to home after PACU  PATIENT DISPOSITION:  PACU - hemodynamically stable.   Delay start of Pharmacological VTE agent (>24hrs) due to surgical blood loss or risk of bleeding: not applicable  Complications: none immediate  Jay Haskew L. Harraway-Smith, M.D., [facility]

## 2017-12-22 NOTE — H&P (Signed)
Preoperative History and Physical  Cindy Garrison is a 47 y.o. G3P0030 here for surgical management of AUB.   Proposed surgery: hysteroscopy with endometrial ablation   Past Medical History:  Diagnosis Date  . #[id number]   . Acne   . Back pain   . Essential hypertension   . Seasonal allergies    Past Surgical History:  Procedure Laterality Date  . NO PAST SURGERIES     OB History    Gravida  3   Para  0   Term  0   Preterm  0   AB  3   Living  0     SAB  3   TAB  0   Ectopic  0   Multiple  0   Live Births             Patient denies any cervical dysplasia or STIs. Medications Prior to Admission  Medication Sig Dispense Refill Last Dose  . acetaminophen (TYLENOL) 500 MG tablet Take 500-1,000 mg by mouth every 6 (six) hours as needed for mild pain.    12/22/2017 at 0500  . amLODipine (NORVASC) 5 MG tablet Take 1 tablet (5 mg total) by mouth daily. 30 tablet 6 12/22/2017 at 0500  . atorvastatin (LIPITOR) 20 MG tablet Take 1 tablet (20 mg total) by mouth daily. To lower cholesterol 30 tablet 6 Past Week at Unknown time  . diclofenac (VOLTAREN) 75 MG EC tablet Take 1 tablet (75 mg total) by mouth 2 (two) times daily as needed for moderate pain. Take with food. 60 tablet 2 Past Week at Unknown time  . diclofenac sodium (VOLTAREN) 1 % GEL Apply 2 g topically 4 (four) times daily. As needed for pain relief 2 Tube 4 Past Week at Unknown time  . haloperidol (HALDOL) 1 MG tablet Take 2 mg by mouth at bedtime.   Past Week at Unknown time  . hydrochlorothiazide (HYDRODIURIL) 25 MG tablet Take 1 tablet (25 mg total) by mouth daily. 30 tablet 6 12/21/2017 at Unknown time  . lisinopril (PRINIVIL,ZESTRIL) 40 MG tablet Take 1 tablet (40 mg total) by mouth daily. To lower blood pressure 30 tablet 6 12/21/2017 at Unknown time  . megestrol (MEGACE) 20 MG tablet Take 2 tablets (40 mg total) by mouth 4 (four) times daily. 240 tablet 2 12/21/2017 at Unknown time  . potassium chloride SA  (K-DUR,KLOR-CON) 20 MEQ tablet Take 2 tablets (40 mEq total) by mouth 3 (three) times daily. 12 tablet 0 Past Week at Unknown time    Allergies  Allergen Reactions  . Other Rash    Blood pressure pill (unknown name)   Social History:   reports that she has never smoked. She has never used smokeless tobacco. She reports that she drank alcohol. She reports that she does not use drugs. No family history on file.  Review of Systems: Noncontributory  PHYSICAL EXAM: Last menstrual period 12/08/2017. General appearance - alert, well appearing, and in no distress Chest - clear to auscultation, no wheezes, rales or rhonchi, symmetric air entry Heart - normal rate and regular rhythm Abdomen - soft, nontender, nondistended, no masses or organomegaly Pelvic - examination not indicated Extremities - peripheral pulses normal, no pedal edema, no clubbing or cyanosis  Labs: Results for orders placed or performed during the hospital encounter of 12/22/17 (from the past 336 hour(s))  Pregnancy, urine   Collection Time: 12/22/17 10:00 AM  Result Value Ref Range   Preg Test, Ur NEGATIVE NEGATIVE  Potassium  Collection Time: 12/22/17 10:08 AM  Result Value Ref Range   Potassium 3.8 3.5 - 5.1 mmol/L  Results for orders placed or performed during the hospital encounter of 12/16/17 (from the past 336 hour(s))  Basic metabolic panel   Collection Time: 12/16/17  8:41 AM  Result Value Ref Range   Sodium 136 135 - 145 mmol/L   Potassium 2.9 (L) 3.5 - 5.1 mmol/L   Chloride 101 98 - 111 mmol/L   CO2 25 22 - 32 mmol/L   Glucose, Bld 82 70 - 99 mg/dL   BUN 11 6 - 20 mg/dL   Creatinine, Ser 0.73 0.44 - 1.00 mg/dL   Calcium 9.0 8.9 - 10.3 mg/dL   GFR calc non Af Amer >60 >60 mL/min   GFR calc Af Amer >60 >60 mL/min   Anion gap 10 5 - 15  CBC   Collection Time: 12/16/17  8:41 AM  Result Value Ref Range   WBC 7.2 4.0 - 10.5 K/uL   RBC 5.33 (H) 3.87 - 5.11 MIL/uL   Hemoglobin 13.6 12.0 - 15.0 g/dL    HCT 40.6 36.0 - 46.0 %   MCV 76.2 (L) 80.0 - 100.0 fL   MCH 25.5 (L) 26.0 - 34.0 pg   MCHC 33.5 30.0 - 36.0 g/dL   RDW 16.2 (H) 11.5 - 15.5 %   Platelets 277 150 - 400 K/uL   nRBC 0.0 0.0 - 0.2 %   03/31/2016 Diagnosis Endometrium, biopsy ENDOMETRIUM WITH HORMONE EFFECT ENDOCERVICAL POLYP NEGATIVE FOR ATYPIA OR MALIGNANCY  Imaging Studies: 03/26/2015 CLINICAL DATA:  Vaginal bleeding.  EXAM: TRANSABDOMINAL AND TRANSVAGINAL ULTRASOUND OF PELVIS  TECHNIQUE: Both transabdominal and transvaginal ultrasound examinations of the pelvis were performed. Transabdominal technique was performed for global imaging of the pelvis including uterus, ovaries, adnexal regions, and pelvic cul-de-sac. It was necessary to proceed with endovaginal exam following the transabdominal exam to visualize the uterus and ovaries.  COMPARISON:  10/05/2014.  FINDINGS: Uterus  Measurements: 12.3 x 6.0 x 13.2 cm. Multiple fibroids are present with the largest measuring 6 cm. Present.  Endometrium  Thickness: 19 mm.  A 3.1 cm endometrial or submucosal mass is noted.  Right ovary  Measurements: 3.4 x 2.2 x 2.3 cm. Normal appearance/no adnexal mass.  Left ovary  Measurements: 3.5 x 2.6 x 3.5 cm. Normal appearance/no adnexal mass.  Other findings  No abnormal free fluid.  IMPRESSION: 1. Multiple fibroids.  2. Endometrial thickening to 19 mm. A 3.1 cm endometrial or submucosal mass is noted.  A focal endometrial lesion issuspected. Consider sonohysterogram forfurther evaluation, prior to hysteroscopy or endometrial biopsy.  Assessment: Patient Active Problem List   Diagnosis Date Noted  . Fibroid uterus 03/26/2015  . DUB (dysfunctional uterine bleeding) 03/26/2015  . Iron deficiency anemia due to chronic blood loss 11/22/2014  . Absolute anemia   . Syncope 09/16/2014  . Anemia 09/16/2014  . Menorrhagia 09/16/2014  . Schizoaffective disorder ([place]) 09/16/2014  . Syncope  and collapse 09/16/2014  . Back pain   . Essential hypertension   . Allergic rhinitis 09/13/2006    Plan: Patient will undergo surgical management with hysteroscopy with endometrial ablation. The risks of surgery were discussed in detail with the patient including but not limited to: bleeding which may require transfusion or reoperation; infection which may require antibiotics; injury to surrounding organs which may involve bowel, bladder, ureters ; need for additional procedures including laparoscopy or laparotomy; thromboembolic phenomenon, surgical site problems and other postoperative/anesthesia complications. Likelihood of success in  alleviating the patient's condition was discussed. Routine postoperative instructions will be reviewed with the patient and her family in detail after surgery.  The patient concurred with the proposed plan, giving informed written consent for the surgery.  Patient has been NPO since last night she will remain NPO for procedure.  Anesthesia and OR aware.  Preoperative prophylactic antibiotics and SCDs ordered on call to the OR.  To OR when ready.  Garrison Michie L. Ihor Dow, M.D., [facility] 12/22/2017 10:41 AM

## 2017-12-22 NOTE — Progress Notes (Signed)
Dr. Ihor Dow at bedside to review procedure with the patient, Cindy Garrison , [place], [phone]

## 2017-12-23 ENCOUNTER — Encounter (HOSPITAL_COMMUNITY): Payer: Self-pay | Admitting: Obstetrics & Gynecology

## 2018-01-05 MED FILL — ?AMLODIPINE BESYLATE 5 MG T: 5 | 30 days supply | Qty: 30 | Fill #2

## 2018-01-05 MED FILL — HYDROCHLOROTHIAZIDE 25 MG T: 25 | 30 days supply | Qty: 30 | Fill #2

## 2018-01-06 MED FILL — HALOPERIDOL 2 MG TABLET: 2 | 30 days supply | Qty: 30 | Fill #0

## 2018-01-09 MED FILL — ?ATORVASTATIN 20 MG TABLET: 20 | 30 days supply | Qty: 30 | Fill #1

## 2018-01-09 MED FILL — LISINOPRIL 40 MG TABLET: 40 | 30 days supply | Qty: 30 | Fill #2

## 2018-01-16 MED FILL — MEGESTROL 20 MG TABLET: 20 | 30 days supply | Qty: 240 | Fill #2

## 2018-01-24 ENCOUNTER — Ambulatory Visit: Payer: Self-pay | Attending: Family Medicine

## 2018-01-24 DIAGNOSIS — E782 Mixed hyperlipidemia: Secondary | ICD-10-CM | POA: Insufficient documentation

## 2018-01-24 DIAGNOSIS — I10 Essential (primary) hypertension: Secondary | ICD-10-CM | POA: Insufficient documentation

## 2018-01-24 DIAGNOSIS — E7800 Pure hypercholesterolemia, unspecified: Secondary | ICD-10-CM | POA: Insufficient documentation

## 2018-01-24 DIAGNOSIS — Z79899 Other long term (current) drug therapy: Secondary | ICD-10-CM | POA: Insufficient documentation

## 2018-01-24 NOTE — Progress Notes (Signed)
Patient here for lab visit only 

## 2018-01-25 LAB — HEPATIC FUNCTION PANEL: Bilirubin, Direct: 0.09 mg/dL (ref 0.00–0.40)

## 2018-01-25 LAB — COMPREHENSIVE METABOLIC PANEL WITH GFR
ALT: 16 IU/L (ref 0–32)
AST: 13 IU/L (ref 0–40)
Albumin/Globulin Ratio: 2 (ref 1.2–2.2)
Albumin: 4.5 g/dL (ref 3.5–5.5)
Alkaline Phosphatase: 68 IU/L (ref 39–117)
BUN/Creatinine Ratio: 22 (ref 9–23)
BUN: 17 mg/dL (ref 6–24)
Bilirubin Total: 0.3 mg/dL (ref 0.0–1.2)
CO2: 25 mmol/L (ref 20–29)
Calcium: 8.6 mg/dL — ABNORMAL LOW (ref 8.7–10.2)
Chloride: 98 mmol/L (ref 96–106)
Creatinine, Ser: 0.77 mg/dL (ref 0.57–1.00)
GFR calc Af Amer: 105 mL/min/1.73
GFR calc non Af Amer: 91 mL/min/1.73
Globulin, Total: 2.3 g/dL (ref 1.5–4.5)
Glucose: 86 mg/dL (ref 65–99)
Potassium: 3.7 mmol/L (ref 3.5–5.2)
Sodium: 140 mmol/L (ref 134–144)
Total Protein: 6.8 g/dL (ref 6.0–8.5)

## 2018-01-25 LAB — HEMOGLOBIN A1C
Est. average glucose Bld gHb Est-mCnc: 111 mg/dL
Hgb A1c MFr Bld: 5.5 % (ref 4.8–5.6)

## 2018-01-31 ENCOUNTER — Encounter: Payer: Self-pay | Admitting: Obstetrics & Gynecology

## 2018-01-31 ENCOUNTER — Ambulatory Visit (INDEPENDENT_AMBULATORY_CARE_PROVIDER_SITE_OTHER): Payer: Self-pay | Admitting: Obstetrics & Gynecology

## 2018-01-31 VITALS — BP 138/88 | HR 83 | Ht 60.0 in | Wt 138.0 lb

## 2018-01-31 DIAGNOSIS — Z9889 Other specified postprocedural states: Secondary | ICD-10-CM

## 2018-01-31 NOTE — Progress Notes (Signed)
History:  48 y.o. G3P0030 here today for post op check 12/22/2017. Pt reports bleeding since that time. She has been taking megace (or a medication that she had delivered from [place]). She does not have the meds with her. Pt wants a history.  The following portions of the patient's history were reviewed and updated as appropriate: allergies, current medications, past family history, past medical history, past social history, past surgical history and problem list.  Review of Systems:  Pertinent items are noted in HPI.    Objective:  Physical Exam Height 5' (1.524 m), weight 138 lb (62.6 kg), last menstrual period 01/11/2018.  CONSTITUTIONAL: Well-developed, well-nourished female in no acute distress.  HENT:  Normocephalic, atraumatic EYES: Conjunctivae and EOM are normal. No scleral icterus.  NECK: Normal range of motion SKIN: Skin is warm and dry. No rash noted. Not diaphoretic.No pallor. [place]: Alert and oriented to person, place, and time. Normal coordination.  Abd: Soft, nontender and nondistended Pelvic: deferred   Assessment & Plan:  6 week post op check  rec stop meds from [place]  F/u in 4 weeks  Reeducated on sx after the procedure  Pt to call sooner if she keeps bleeding and ask to have a note left for me if  Needed.  Dravon Nott L. Harraway-Smith, M.D., [facility]

## 2018-02-02 ENCOUNTER — Other Ambulatory Visit: Payer: Self-pay | Admitting: Obstetrics & Gynecology

## 2018-02-02 ENCOUNTER — Other Ambulatory Visit: Payer: Self-pay | Admitting: Family Medicine

## 2018-02-02 ENCOUNTER — Telehealth: Payer: Self-pay | Admitting: General Practice

## 2018-02-02 DIAGNOSIS — N939 Abnormal uterine and vaginal bleeding, unspecified: Secondary | ICD-10-CM

## 2018-02-02 MED ORDER — MEGESTROL ACETATE 20 MG PO TABS: 40.0000 mg | ORAL_TABLET | Freq: Two times a day (BID) | ORAL | 1 refills | Status: DC

## 2018-02-02 MED FILL — HYDROCHLOROTHIAZIDE 25 MG T: 25 | 30 days supply | Qty: 30 | Fill #3

## 2018-02-02 NOTE — Telephone Encounter (Signed)
Called patient with [place] interpreter [phone] multiple times, no answer- left message stating we are calling to inform you, your prescription has been sent to the pharmacy. Please only take the medication twice a day and no more than twice a day. You may call us back if you have questions.

## 2018-02-02 NOTE — Telephone Encounter (Signed)
-----  Message from Lavonia Drafts, MD sent at 02/02/2018 11:54 AM EST ----- Regarding: RE: Megace rx request Please call pt. Her Megace is at the pharmacy. PLEASE let her know that it is ONLY twice a day.   You will need a Scientist, water quality.   Thx, Clh-S    ----- Message ----- From: Aura Camps, MD Sent: 02/02/2018   8:43 AM EST To: Lavonia Drafts, MD Subject: Megace rx request                              Good Morning, This patient asked for a refill of megace. I declined to rx because she's already had an ablation and I've never met her. I'm sending you this message in case you think she needs it.   Thanks! Nimeka

## 2018-02-03 MED FILL — ?AMLODIPINE BESYLATE 5 MG T: 5 MG | 30 days supply | Qty: 30 | Fill #3

## 2018-02-04 ENCOUNTER — Ambulatory Visit (INDEPENDENT_AMBULATORY_CARE_PROVIDER_SITE_OTHER): Payer: Self-pay | Admitting: Family Medicine

## 2018-02-04 ENCOUNTER — Encounter: Payer: Self-pay | Admitting: Family Medicine

## 2018-02-04 ENCOUNTER — Other Ambulatory Visit: Payer: Self-pay

## 2018-02-04 VITALS — BP 128/84 | HR 68 | Temp 98.0°F | Resp 16 | Ht 62.21 in | Wt 138.0 lb

## 2018-02-04 DIAGNOSIS — Z9889 Other specified postprocedural states: Secondary | ICD-10-CM

## 2018-02-04 DIAGNOSIS — N921 Excessive and frequent menstruation with irregular cycle: Secondary | ICD-10-CM

## 2018-02-04 DIAGNOSIS — N946 Dysmenorrhea, unspecified: Secondary | ICD-10-CM

## 2018-02-04 DIAGNOSIS — I10 Essential (primary) hypertension: Secondary | ICD-10-CM

## 2018-02-04 DIAGNOSIS — Z09 Encounter for follow-up examination after completed treatment for conditions other than malignant neoplasm: Secondary | ICD-10-CM

## 2018-02-04 MED ORDER — HYDROCHLOROTHIAZIDE 25 MG PO TABS: 25.0000 mg | ORAL_TABLET | Freq: Every day | ORAL | 6 refills | Status: DC

## 2018-02-04 NOTE — Patient Instructions (Addendum)
She will follow up at GYN for possible hysterectomy. Referral placed.     If you have lab work done today you will be contacted with your lab results within the next 2 weeks.  If you have not heard from [place] then please contact us. The fastest way to get your results is to register for My Chart.   IF you received an x-ray today, you will receive an invoice from [facility]. Please contact [facility] at [phone] with questions or concerns regarding your invoice.   IF you received labwork today, you will receive an invoice from [facility]. Please contact [facility] at [phone] with questions or concerns regarding your invoice.   Our billing staff will not be able to assist you with questions regarding bills from these companies.  You will be contacted with the lab results as soon as they are available. The fastest way to get your results is to activate your My Chart account. Instructions are located on the last page of this paperwork. If you have not heard from [place] regarding the results in 2 weeks, please contact this office.

## 2018-02-04 NOTE — Progress Notes (Signed)
New Patient Office Visit  Subjective:  Patient ID: Cindy Garrison, female    DOB: [birth year]  Age: 48 y.o. MRN: [id number]  CC:  Chief Complaint  Patient presents with  . Menstrual Problem    pt states she has been having spotting x 3 weeks   . Contraception    pt would like the birthcontrol patch     HPI Cindy Garrison presents to establish care today. She was a previous patient at [facility]. She is currently taking Megace because of continuous periods for 3 weeks. She takes OTC pain medications for menstrual cramps. Today she would like to be on birth control to regulate menstrual cycles with plans for a hysterectomy in the future. Recent D&C on 12/22/2017 for Dysfunctional Uterine Bleeding. She is concerned that she will conceive and would like to plan for hysterectomy asap. She follows up with GYN at [facility], and she has an appointment in 2 months. She has no other issues at this time. She is accompanied today by an interpreter. She denies fevers, chills, fatigue, recent infections, weight loss, and night sweats. She has not had any headaches, visual changes, dizziness, and falls. No chest pain, heart palpitations, cough and shortness of breath reported. No reports of GI problems such as nausea, vomiting, diarrhea, and constipation. She has no reports of blood in stools, dysuria and hematuria. No depression or anxiety reported.   Past Medical History:  Diagnosis Date  . #[id number]   . Acne   . Back pain   . Essential hypertension   . Seasonal allergies     Past Surgical History:  Procedure Laterality Date  . DILITATION & CURRETTAGE/HYSTROSCOPY WITH NOVASURE ABLATION N/A 12/22/2017   Procedure: DILATATION & CURETTAGE/ HYSTEROSCOPY WITH ENDOMETRIAL FIBROID AND  NOVASURE ABLATION;  Surgeon: Lavonia Drafts, MD;  Location: [facility];  Service: Gynecology;  Laterality: N/A;  . NO PAST SURGERIES      History reviewed. No pertinent family history.  Social  History   Socioeconomic History  . Marital status: Single    Spouse name: Not on file  . Number of children: Not on file  . Years of education: Not on file  . Highest education level: Not on file  Occupational History  . Not on file  Social Needs  . Financial resource strain: Not on file  . Food insecurity:    Worry: Often true    Inability: Often true  . Transportation needs:    Medical: Yes    Non-medical: No  Tobacco Use  . Smoking status: Never Smoker  . Smokeless tobacco: Never Used  Substance and Sexual Activity  . Alcohol use: Not Currently    Comment: occ.  . Drug use: No  . Sexual activity: Not on file    Comment: monogamous female partner for 5 years (2010)  Lifestyle  . Physical activity:    Days per week: Not on file    Minutes per session: Not on file  . Stress: Not on file  Relationships  . Social connections:    Talks on phone: Not on file    Gets together: Not on file    Attends religious service: Not on file    Active member of club or organization: Not on file    Attends meetings of clubs or organizations: Not on file    Relationship status: Not on file  . Intimate partner violence:    Fear of current or ex partner: Not on file  Emotionally abused: Not on file    Physically abused: Not on file    Forced sexual activity: Not on file  Other Topics Concern  . Not on file  Social History Narrative  . Not on file    ROS Review of Systems  Constitutional: Negative.   HENT: Negative.   Eyes: Negative.   Respiratory: Negative.   Cardiovascular: Negative.   Gastrointestinal: Negative.   Endocrine: Negative.   Genitourinary: Negative.   Musculoskeletal: Negative.   Skin: Negative.   Allergic/Immunologic: Negative.   Neurological: Negative.   Hematological: Negative.   Psychiatric/Behavioral: Negative.    Objective:   Today's Vitals: BP 128/84   Pulse 68   Temp 98 F (36.7 C) (Oral)   Resp 16   Ht 5' 2.21" (1.58 m)   Wt 138 lb (62.6  kg)   LMP 01/11/2018 (Exact Date)   SpO2 100%   BMI 25.07 kg/m   Physical Exam Vitals signs and nursing note reviewed.  Constitutional:      Appearance: Normal appearance. She is normal weight.  HENT:     Head: Normocephalic and atraumatic.     Right Ear: External ear normal.     Left Ear: External ear normal.     Nose: Nose normal.     Mouth/Throat:     Mouth: Mucous membranes are moist.     Pharynx: Oropharynx is clear.  Eyes:     Conjunctiva/sclera: Conjunctivae normal.  Neck:     Musculoskeletal: Normal range of motion and neck supple.  Cardiovascular:     Rate and Rhythm: Normal rate and regular rhythm.     Pulses: Normal pulses.     Heart sounds: Normal heart sounds.  Pulmonary:     Effort: Pulmonary effort is normal.     Breath sounds: Normal breath sounds.  Abdominal:     General: Bowel sounds are normal.     Palpations: Abdomen is soft.  Musculoskeletal: Normal range of motion.  Skin:    General: Skin is warm and dry.     Capillary Refill: Capillary refill takes less than 2 seconds.  Neurological:     General: No focal deficit present.     Mental Status: She is alert and oriented to person, place, and time.  Psychiatric:        Mood and Affect: Mood normal.        Behavior: Behavior normal.        Thought Content: Thought content normal.        Judgment: Judgment normal.    Assessment & Plan:   1. Status post D&C She is doing well. Minimal spotting. We will refer her for possible hysterectomy.  - Ambulatory referral to Gynecology  2. Dysmenorrhea Keep appointment with GYN for possible hysterectomy. - Ambulatory referral to Gynecology  3. Menorrhagia with irregular cycle Detailed explanation of several birth control options including birth control path. She is uninterested in all at this time. She would prefer hysterectomy at this time. We will refer her back to her GYN for further discussion. - Ambulatory referral to Gynecology  4.  Hypercalcemia Continue OTC Calcium supplement.  5. Uncontrolled hypertension - hydrochlorothiazide (HYDRODIURIL) 25 MG tablet; Take 1 tablet (25 mg total) by mouth daily.  Dispense: 30 tablet; Refill: 6  6. Essential hypertension Blood pressure is stable today at 128/84. She will continue Amlodipine, HCTZ, and Lisinopril as prescribed. She will continue to decrease high sodium intake, excessive alcohol intake, increase potassium intake, smoking cessation, and increase physical  activity of at least 30 minutes of cardio activity daily. She will continue to follow Heart Healthy or DASH diet. - hydrochlorothiazide (HYDRODIURIL) 25 MG tablet; Take 1 tablet (25 mg total) by mouth daily.  Dispense: 30 tablet; Refill: 6  7. Follow up She will follow up in 3 months.    Problem List Items Addressed This Visit      Cardiovascular and Mediastinum   Essential hypertension   Relevant Medications   hydrochlorothiazide (HYDRODIURIL) 25 MG tablet     Other   Menorrhagia (Chronic)   Relevant Orders   Ambulatory referral to Gynecology    Other Visit Diagnoses    Status post D&C    -  Primary   Relevant Orders   Ambulatory referral to Gynecology   Dysmenorrhea       Relevant Orders   Ambulatory referral to Gynecology   Hypercalcemia       Uncontrolled hypertension       Relevant Medications   hydrochlorothiazide (HYDRODIURIL) 25 MG tablet   Follow up          Outpatient Encounter Medications as of 02/04/2018  Medication Sig  . acetaminophen (TYLENOL) 500 MG tablet Take 500-1,000 mg by mouth every 6 (six) hours as needed for mild pain.   Marland Kitchen amLODipine (NORVASC) 5 MG tablet Take 1 tablet (5 mg total) by mouth daily.  Marland Kitchen atorvastatin (LIPITOR) 20 MG tablet Take 1 tablet (20 mg total) by mouth daily. To lower cholesterol  . diclofenac sodium (VOLTAREN) 1 % GEL Apply 2 g topically 4 (four) times daily. As needed for pain relief  . haloperidol (HALDOL) 1 MG tablet Take 2 mg by mouth at bedtime.  .  hydrochlorothiazide (HYDRODIURIL) 25 MG tablet Take 1 tablet (25 mg total) by mouth daily.  Marland Kitchen lisinopril (PRINIVIL,ZESTRIL) 40 MG tablet Take 1 tablet (40 mg total) by mouth daily. To lower blood pressure  . megestrol (MEGACE) 20 MG tablet Take 2 tablets (40 mg total) by mouth 2 (two) times daily.  . [DISCONTINUED] hydrochlorothiazide (HYDRODIURIL) 25 MG tablet Take 1 tablet (25 mg total) by mouth daily.  . [DISCONTINUED] hydrochlorothiazide (HYDRODIURIL) 25 MG tablet Take 1 tablet (25 mg total) by mouth daily.   No facility-administered encounter medications on file as of 02/04/2018.     Follow-up: Return in about 3 months (around 05/06/2018).   Azzie Glatter, FNP

## 2018-02-06 ENCOUNTER — Telehealth: Payer: Self-pay | Admitting: *Deleted

## 2018-02-06 ENCOUNTER — Encounter: Payer: Self-pay | Admitting: *Deleted

## 2018-02-06 ENCOUNTER — Other Ambulatory Visit: Payer: Self-pay | Admitting: Family Medicine

## 2018-02-06 DIAGNOSIS — N939 Abnormal uterine and vaginal bleeding, unspecified: Secondary | ICD-10-CM

## 2018-02-06 MED FILL — MEGESTROL 20 MG TABLET: 20 | 30 days supply | Qty: 60 | Fill #0

## 2018-02-06 NOTE — Telephone Encounter (Signed)
Received a voice message from [place] that patient has requested refill of her megace. Per chart new refill written 02/02/18. I called [facility] and we reviewed the order which was printed and not e-prescribed . They took a verbal order of prescription from Dr. Maylene RoesTamala Julian on 02/02/18 for megace 20 mg - take 2 tablets bid. They state she was taking more than prescribed of previous rx from other prescriber because she ran out. I asked them to fill new rx and asked if they can put label in french which they said they could. I called patient with [facility] [employer] [phone] and left a message we are calling to let you know We have filled your prescription and please pay close attention to the prescription instructions.  I will also send letter.

## 2018-02-06 NOTE — Addendum Note (Signed)
Addended by: Azzie Glatter on: 02/06/2018 09:44 PM   Modules accepted: Level of Service

## 2018-02-06 NOTE — Addendum Note (Signed)
Addended by: Azzie Glatter on: 02/06/2018 09:46 PM   Modules accepted: Level of Service

## 2018-02-09 MED FILL — LISINOPRIL 40 MG TABLET: 40 | 30 days supply | Qty: 30 | Fill #3

## 2018-02-16 MED FILL — HALOPERIDOL 2 MG TABLET: 2 | 30 days supply | Qty: 30 | Fill #0

## 2018-02-17 ENCOUNTER — Telehealth: Payer: Self-pay | Admitting: *Deleted

## 2018-02-17 NOTE — Telephone Encounter (Signed)
Medical Assistant used [employer] to contact patient.  Interpreter Name: Carlene Coria #: [id number] Patient is aware of not being diabetic and blood work being normal. Psychologist, sport and exercise left message on patient's home and cell voicemail. Voicemail states to give a call back to [place] with [facility] at [phone].

## 2018-02-17 NOTE — Telephone Encounter (Signed)
-----   Message from Antony Blackbird, MD sent at 01/25/2018 10:08 PM EST ----- Hgb A1c was normal at 5.5 and complete metabolic panel was normal

## 2018-02-22 MED FILL — ?ATORVASTATIN 20 MG TABLET: 20 | 30 days supply | Qty: 30 | Fill #2

## 2018-03-06 MED FILL — MEGESTROL 20 MG TABLET: 20 | 30 days supply | Qty: 60 | Fill #1

## 2018-03-06 MED FILL — AMLODIPINE BESYLATE 5 MG TA: 5 | 30 days supply | Qty: 30 | Fill #4

## 2018-03-06 MED FILL — HYDROCHLOROTHIAZIDE 25 MG T: 25 | 30 days supply | Qty: 30 | Fill #0

## 2018-03-06 MED FILL — DICLOFENAC SODIUM 1% GEL: 1 | 12 days supply | Qty: 100 | Fill #1

## 2018-03-09 ENCOUNTER — Ambulatory Visit (INDEPENDENT_AMBULATORY_CARE_PROVIDER_SITE_OTHER): Payer: Self-pay | Admitting: Obstetrics & Gynecology

## 2018-03-09 ENCOUNTER — Encounter: Payer: Self-pay | Admitting: Obstetrics & Gynecology

## 2018-03-09 DIAGNOSIS — N939 Abnormal uterine and vaginal bleeding, unspecified: Secondary | ICD-10-CM

## 2018-03-09 MED ORDER — MEGESTROL ACETATE 20 MG PO TABS: 40.0000 mg | ORAL_TABLET | Freq: Two times a day (BID) | ORAL | 3 refills | Status: DC

## 2018-03-09 NOTE — Addendum Note (Signed)
Addended by: Shelly Coss on: 03/09/2018 02:13 PM   Modules accepted: Orders

## 2018-03-09 NOTE — Progress Notes (Signed)
History:  48 y.o. G3P0030 here today for f/u of AUB. She reports that she is no longer bleeding She reports that her last bleeding has been improved since she stopped taking the high doses of Megace She is taking the Megace bid. She denies side effects from the meds.   She is s/p endometrial ablation on 12/22/2017.   Dicynone (Etamsylate) 500mg  (this is the med she was taking from [place]) she is no longer taking it.  The following portions of the patient's history were reviewed and updated as appropriate: allergies, current medications, past family history, past medical history, past social history, past surgical history and problem list.  Review of Systems:  Pertinent items are noted in HPI.    Objective:  Physical Exam Blood pressure 131/75, pulse 90, height 5' (1.524 m), weight 143 lb (64.9 kg).  CONSTITUTIONAL: Well-developed, well-nourished female in no acute distress.  HENT:  Normocephalic, atraumatic EYES: Conjunctivae and EOM are normal. No scleral icterus.  NECK: Normal range of motion SKIN: Skin is warm and dry. No rash noted. Not diaphoretic.No pallor. [place]: Alert and oriented to person, place, and time. Normal coordination.     Assessment & Plan:  AUB- improved Pt remains on Megace. Given he rresistance to stopping meds will keep Megace Megace 40mg  bid.  F/u in 3 months Will attempt to wean from Megace at that time    Total face-to-face time with patient was 20 min.  Greater than 50% was spent in counseling and coordination of care with the patient.   Video interpreter used. [id number]  Total face-to-face time with patient was 20 min.  Greater than 50% was spent in counseling and coordination of care with the patient.   Amyriah Buras L. Harraway-Smith, M.D., Cherlynn June

## 2018-03-15 ENCOUNTER — Other Ambulatory Visit: Payer: Self-pay

## 2018-03-15 ENCOUNTER — Encounter: Payer: Self-pay | Admitting: Family Medicine

## 2018-03-15 ENCOUNTER — Ambulatory Visit: Payer: Self-pay | Attending: Family Medicine | Admitting: Family Medicine

## 2018-03-15 VITALS — BP 135/88 | HR 77 | Temp 98.5°F | Resp 16 | Wt 146.0 lb

## 2018-03-15 DIAGNOSIS — I10 Essential (primary) hypertension: Secondary | ICD-10-CM

## 2018-03-15 DIAGNOSIS — M79641 Pain in right hand: Secondary | ICD-10-CM

## 2018-03-15 DIAGNOSIS — M25532 Pain in left wrist: Secondary | ICD-10-CM

## 2018-03-15 DIAGNOSIS — M79642 Pain in left hand: Secondary | ICD-10-CM

## 2018-03-15 DIAGNOSIS — G8929 Other chronic pain: Secondary | ICD-10-CM

## 2018-03-15 MED ORDER — DICLOFENAC SODIUM 1 % TD GEL: 2.0000 g | Freq: Four times a day (QID) | TRANSDERMAL | 11 refills | Status: DC

## 2018-03-15 MED FILL — LISINOPRIL 40 MG TABLET: 40 | 30 days supply | Qty: 30 | Fill #4 | Status: TO

## 2018-03-15 MED FILL — DICLOFENAC SODIUM 1% GEL: 1 | 12 days supply | Qty: 100 | Fill #0

## 2018-03-15 NOTE — Progress Notes (Signed)
Pt here for follow -up  Appointment to address blood pressure. She expressed pain in left wrist 10/10.    Interpreter services provided by:  Benita Stabile [phone]

## 2018-03-15 NOTE — Progress Notes (Signed)
Subjective:    Patient ID: Cindy Garrison, female    DOB: 1970/10/08, 48 y.o.   MRN: [id number]   Due to a language barrier, [employer] video interpretation system used.  Patient does have some fluency in [place] but native language is [place].  HPI       48 yo female seen in follow-up of hypertension.  Patient also with complaint at today's visit of bilateral hand pain and pain in her left wrist just below the thumb.  Patient reports that the pain in her hands and wrist is dull and aching and occurs when she has been using her hands a lot or lifting heavy objects.  Patient reports that when she was using the diclofenac gel that the pain in her hands was reduced.  Patient however states that she believes that I discontinued the medication and since she is not using the medicine her hand pain is recurrent.  Patient was asked several times in different ways how long she had been having the hand pain however patient would continue to answer that she was only having the pain since running out of the diclofenac gel (but this medication was likely ordered to help with hand pain).  Patient also with difficulty quantifying the level of pain in her hands on a 0-to-10 scale.      Patient reports that she otherwise feels well.  Patient is taking her blood pressure medication daily.  Patient denies any issues with headaches or dizziness related to her blood pressure.  Patient has had no muscle cramping.  Patient did have hypokalemia in the past.  Patient denies any swelling in her legs, no chest pain or palpitations.  No shortness of breath or cough and no fatigue.  Past Medical History:  Diagnosis Date  . #[id number]   . Acne   . Back pain   . Essential hypertension   . Seasonal allergies    Past Surgical History:  Procedure Laterality Date  . DILITATION & CURRETTAGE/HYSTROSCOPY WITH NOVASURE ABLATION N/A 12/22/2017   Procedure: DILATATION & CURETTAGE/ HYSTEROSCOPY WITH ENDOMETRIAL FIBROID AND  NOVASURE  ABLATION;  Surgeon: Lavonia Drafts, MD;  Location: [facility];  Service: Gynecology;  Laterality: N/A;  On review of chart, patient has seen her GYN on 03/09/18 Social History   Tobacco Use  . Smoking status: Never Smoker  . Smokeless tobacco: Never Used  Substance Use Topics  . Alcohol use: Not Currently    Comment: occ.  . Drug use: No   Allergies  Allergen Reactions  . Other Rash    Blood pressure pill (unknown name)     Review of Systems  Constitutional: Negative for chills, fatigue and fever.  Respiratory: Negative for cough and shortness of breath.   Cardiovascular: Negative for chest pain and leg swelling.  Endocrine: Negative for polydipsia, polyphagia and polyuria.  Genitourinary: Negative for dysuria and frequency.  Musculoskeletal: Positive for arthralgias. Negative for gait problem and joint swelling.  Neurological: Negative for dizziness, numbness and headaches.  Hematological: Negative for adenopathy. Does not bruise/bleed easily.       Objective:   Physical Exam BP 135/88 (BP Location: Left Arm, Patient Position: Sitting, Cuff Size: Normal)   Pulse 77   Temp 98.5 F (36.9 C) (Oral)   Resp 16   Wt 146 lb (66.2 kg)   LMP 09/12/2017 (LMP Unknown)   SpO2 100%   BMI 28.51 kg/m Nurse's notes and vital signs reviewed General-well-nourished, well-developed short statured female in no acute distress Neck-supple,  no lymphadenopathy, no thyromegaly, no carotid bruit Lungs-clear to auscultation bilaterally, breathing is nonlabored Cardiovascular-regular rate and regular rhythm Abdomen-soft, nontender Back-no CVA tenderness Extremities-no edema Musculoskeletal-patient with complaint of tenderness with palpation of the left radial wrist proximal to the base of the thumb/hand and patient with complaint of discomfort to palpation in the left dorsum of the mid hand just above the wrist.  Patient also with complaint of discomfort in the right mid dorsum of the hand.   No palpable abnormality.        Assessment & Plan:  1. Essential hypertension Patient's blood pressure is reasonably controlled at today's visit and stable therefore she will continue her current medication regimen of lisinopril, hydrochlorothiazide and amlodipine.  Patient had blood work done in December of last year therefore this was not needed at today's visit.  Patient is encouraged to have orange juice or bananas once or twice per week to help with normalization of potassium as well as potassium rich diet has been shown to help with blood pressure.  2. Bilateral hand pain Patient with bilateral hand pain complaint but patient was unable to quantify the level of pain or the duration of the pain.  New prescription sent to patient's pharmacy for diclofenac gel which patient states has helped in the past.  Orders were placed for patient to have x-rays of her hands as well as left wrist if her pain does not resolve after restarting the diclofenac gel - DG Wrist Complete Left; Future - DG Hand Complete Left; Future - DG Hand Complete Right; Future - diclofenac sodium (VOLTAREN) 1 % GEL; Apply 2 g topically 4 (four) times daily. As needed for pain relief  Dispense: 2 Tube; Refill: 11  3. Chronic pain of left wrist Patient with complaint of recurrent pain of her left wrist.  Patient given prescription for diclofenac gel but order also placed for an x-ray if patient continues to have wrist pain after he start of diclofenac gel - DG Wrist Complete Left; Future - diclofenac sodium (VOLTAREN) 1 % GEL; Apply 2 g topically 4 (four) times daily. As needed for pain relief  Dispense: 2 Tube; Refill: 11  An After Visit Summary was printed and given to the patient.  Return in about 4 months (around 07/14/2018) for HTN.

## 2018-03-29 MED FILL — ?ATORVASTATIN 20 MG TABLET: 20 | 30 days supply | Qty: 30 | Fill #3

## 2018-03-29 MED FILL — HALOPERIDOL 2 MG TABLET: 2 | 30 days supply | Qty: 30 | Fill #1

## 2018-04-06 MED FILL — HYDROCHLOROTHIAZIDE 25 MG T: 25 | 30 days supply | Qty: 30 | Fill #1

## 2018-04-06 MED FILL — MEGESTROL 20 MG TABLET: 20 | 30 days supply | Qty: 60 | Fill #0

## 2018-04-06 MED FILL — AMLODIPINE BESYLATE 5 MG TA: 5 | 30 days supply | Qty: 30 | Fill #5

## 2018-04-27 MED FILL — HALOPERIDOL 2 MG TABLET: 2 | 30 days supply | Qty: 30 | Fill #2

## 2018-04-27 MED FILL — ?ATORVASTATIN 20 MG TABLET: 20 | 30 days supply | Qty: 30 | Fill #4

## 2018-05-01 MED FILL — AMLODIPINE BESYLATE 5 MG TA: 5 | 30 days supply | Qty: 30 | Fill #6

## 2018-05-01 MED FILL — HYDROCHLOROTHIAZIDE 25 MG T: 25 | 30 days supply | Qty: 30 | Fill #2

## 2018-05-03 MED FILL — MEGESTROL 20 MG TABLET: 20 | 30 days supply | Qty: 60 | Fill #1

## 2018-05-31 ENCOUNTER — Telehealth: Payer: Self-pay | Admitting: Family Medicine

## 2018-05-31 NOTE — Telephone Encounter (Signed)
A gentleman called in with the patient to get her scheduled for her 3 month check-up. Used french interpreter ID# [phone] to communicate with patient and gentleman. A virtual appointment was given to them on 6/1 @ 1:15. Patient was informed that this will be a virtual visit and she will not be coming to the office. Patient verbalized understanding. Patient verbalized that she could not download the app right now while we were on the phone but would download it later today. App information was given.

## 2018-06-05 MED FILL — ?ATORVASTATIN 20 MG TABLET: 20 | 30 days supply | Qty: 30 | Fill #5

## 2018-06-05 MED FILL — MEGESTROL 20 MG TABLET: 20 | 30 days supply | Qty: 60 | Fill #2

## 2018-06-05 MED FILL — HYDROCHLOROTHIAZIDE 25 MG T: 25 | 30 days supply | Qty: 30 | Fill #3

## 2018-06-05 MED FILL — AMLODIPINE BESYLATE 5 MG TA: 5 | 30 days supply | Qty: 30 | Fill #0

## 2018-06-08 ENCOUNTER — Other Ambulatory Visit: Payer: Self-pay | Admitting: Family Medicine

## 2018-06-08 DIAGNOSIS — I10 Essential (primary) hypertension: Secondary | ICD-10-CM

## 2018-06-08 MED FILL — LISINOPRIL 40 MG TABLET: 40 | 30 days supply | Qty: 30 | Fill #0

## 2018-06-08 MED FILL — DICLOFENAC SODIUM 1% GEL: 1 | 12 days supply | Qty: 100 | Fill #2

## 2018-06-19 ENCOUNTER — Encounter: Payer: Self-pay | Admitting: Obstetrics & Gynecology

## 2018-06-19 ENCOUNTER — Telehealth: Payer: Self-pay | Admitting: Obstetrics & Gynecology

## 2018-06-19 ENCOUNTER — Other Ambulatory Visit: Payer: Self-pay

## 2018-06-19 NOTE — Telephone Encounter (Signed)
Attempted to call patient to get her rescheduled for her missed appointment. No answer, left a voicemail with new appointment ( 6/18 @ 9:35 -Virtual). Instruction left to downloaded [employer] if not downloaded yet. Appointment remainder mail with [facility] information as well.

## 2018-06-19 NOTE — Progress Notes (Signed)
1:07 I called Shaney with Interpreter and left a message I am calling for your virtual appt and will call again in a few minutes. Please be available by phone. Linda,RN 1:16  I called Lakelyn with Interpreter and left a message I am calling for your virtual appt and will call again in a few minutes. Please be available by phone. Linda,RN 1:22 I called Jaydence with Interpreter and left a message I am calling for your virtual appt and since we missed you we will need to have you reschedule. Please call our office.  Linda,RN

## 2018-06-29 MED FILL — HALOPERIDOL 2 MG TABLET: 2 | 30 days supply | Qty: 30 | Fill #0

## 2018-07-06 MED FILL — HYDROCHLOROTHIAZIDE 25 MG T: 25 | 30 days supply | Qty: 30 | Fill #4

## 2018-07-06 MED FILL — AMLODIPINE BESYLATE 5 MG TA: 5 | 30 days supply | Qty: 30 | Fill #1

## 2018-07-07 ENCOUNTER — Other Ambulatory Visit: Payer: Self-pay

## 2018-07-07 ENCOUNTER — Encounter: Payer: Self-pay | Admitting: Obstetrics & Gynecology

## 2018-07-07 NOTE — Progress Notes (Signed)
Mardene Celeste #[id number]   Had interpreter call patient, phone didn't ring and had a message stating that the phone is unable to receive calls at the moment attempted to call brothers phone on file and it just kept ringing no vm picked up couldn't leave message.

## 2018-07-14 ENCOUNTER — Ambulatory Visit: Payer: Self-pay | Admitting: Family Medicine

## 2018-07-17 MED FILL — DICLOFENAC SODIUM 1% GEL: 1 | 12 days supply | Qty: 100 | Fill #3

## 2018-07-17 MED FILL — LISINOPRIL 40 MG TAB: 40 | 30 days supply | Qty: 30 | Fill #1

## 2018-07-26 ENCOUNTER — Ambulatory Visit: Payer: Self-pay | Admitting: Family Medicine

## 2018-07-28 ENCOUNTER — Emergency Department (HOSPITAL_COMMUNITY): Payer: Self-pay

## 2018-07-28 ENCOUNTER — Encounter (HOSPITAL_COMMUNITY): Payer: Self-pay | Admitting: Emergency Medicine

## 2018-07-28 ENCOUNTER — Emergency Department (HOSPITAL_COMMUNITY)
Admission: EM | Admit: 2018-07-28 | Discharge: 2018-07-28 | Disposition: A | Discharge status: Home / Self Care | Payer: Self-pay | Attending: Emergency Medicine | Admitting: Emergency Medicine

## 2018-07-28 ENCOUNTER — Other Ambulatory Visit: Payer: Self-pay

## 2018-07-28 DIAGNOSIS — Z20828 Contact with and (suspected) exposure to other viral communicable diseases: Secondary | ICD-10-CM | POA: Insufficient documentation

## 2018-07-28 DIAGNOSIS — D251 Intramural leiomyoma of uterus: Secondary | ICD-10-CM | POA: Insufficient documentation

## 2018-07-28 DIAGNOSIS — K5900 Constipation, unspecified: Secondary | ICD-10-CM | POA: Insufficient documentation

## 2018-07-28 DIAGNOSIS — R1031 Right lower quadrant pain: Secondary | ICD-10-CM | POA: Insufficient documentation

## 2018-07-28 DIAGNOSIS — Z79899 Other long term (current) drug therapy: Secondary | ICD-10-CM | POA: Insufficient documentation

## 2018-07-28 DIAGNOSIS — I10 Essential (primary) hypertension: Secondary | ICD-10-CM | POA: Insufficient documentation

## 2018-07-28 LAB — I-STAT BETA HCG BLOOD, ED (MC, WL, AP ONLY): I-stat hCG, quantitative: <5 m[IU]/mL (ref <5)

## 2018-07-28 LAB — COMPREHENSIVE METABOLIC PANEL
ALT: 36 U/L (ref 0–44)
AST: 29 U/L (ref 15–41)
Albumin: 4.3 g/dL (ref 3.5–5.0)
Alkaline Phosphatase: 78 U/L (ref 38–126)
Anion gap: 11 (ref 5–15)
BUN: 11 mg/dL (ref 6–20)
CO2: 24 mmol/L (ref 22–32)
Calcium: 9 mg/dL (ref 8.9–10.3)
Chloride: 101 mmol/L (ref 98–111)
Creatinine, Ser: 0.61 mg/dL (ref 0.44–1.00)
GFR calc Af Amer: >60 mL/min (ref >60)
GFR calc non Af Amer: >60 mL/min (ref >60)
Glucose, Bld: 106 mg/dL — ABNORMAL HIGH (ref 70–99)
Potassium: 3.2 mmol/L — ABNORMAL LOW (ref 3.5–5.1)
Sodium: 136 mmol/L (ref 135–145)
Total Bilirubin: 0.7 mg/dL (ref 0.3–1.2)
Total Protein: 7.3 g/dL (ref 6.5–8.1)

## 2018-07-28 LAB — URINALYSIS, ROUTINE W REFLEX MICROSCOPIC
Bilirubin Urine: NEGATIVE
Glucose, UA: NEGATIVE mg/dL
Ketones, ur: NEGATIVE mg/dL
Nitrite: NEGATIVE
Protein, ur: NEGATIVE mg/dL
Specific Gravity, Urine: 1.016 (ref 1.005–1.030)
pH: 6 (ref 5.0–8.0)

## 2018-07-28 LAB — CBC
HCT: 36 % (ref 36.0–46.0)
Hemoglobin: 13 g/dL (ref 12.0–15.0)
MCH: 27.6 pg (ref 26.0–34.0)
MCHC: 36.1 g/dL — ABNORMAL HIGH (ref 30.0–36.0)
MCV: 76.4 fL — ABNORMAL LOW (ref 80.0–100.0)
Platelets: 246 10*3/uL (ref 150–400)
RBC: 4.71 MIL/uL (ref 3.87–5.11)
RDW: 12.8 % (ref 11.5–15.5)
WBC: 8.2 10*3/uL (ref 4.0–10.5)
nRBC: 0 % (ref 0.0–0.2)

## 2018-07-28 LAB — SARS CORONAVIRUS 2 BY RT PCR (HOSPITAL ORDER, PERFORMED IN ~~LOC~~ HOSPITAL LAB): SARS Coronavirus 2: NEGATIVE

## 2018-07-28 LAB — LIPASE, BLOOD: Lipase: 28 U/L (ref 11–51)

## 2018-07-28 MED ORDER — KETOROLAC TROMETHAMINE 60 MG/2ML IM SOLN
60.0000 mg | Freq: Once | INTRAMUSCULAR | Status: AC
  Administered 2018-07-28: 60 mg via INTRAMUSCULAR
  Filled 2018-07-28: qty 2

## 2018-07-28 MED ORDER — IBUPROFEN 800 MG PO TABS: 800.0000 mg | ORAL_TABLET | Freq: Three times a day (TID) | ORAL | 0 refills | Status: DC

## 2018-07-28 MED FILL — IBUPROFEN 800 MG TABLET: 800 | 7 days supply | Qty: 21 | Fill #0

## 2018-07-28 NOTE — ED Triage Notes (Signed)
Patient c/o lower abd pain x2 days. Denies dysuria, N/V or constipation. Last BM was today.

## 2018-07-28 NOTE — ED Provider Notes (Signed)
Pt signed out by Dr. Randal Buba pending CT scan results.  The CT scan showed a possible early appendicitis.  I went back to speak with pt and she said she feels better, but still has some pain in her right and her left side.  Since she still has pain, I spoke with surgery.  Surgery saw her and do not think she has appendicitis.  I have a low suspicion for appendicitis due to clinical picture, so I will send her home and have her return if her pain worsens.    Due to language barrier, an interpreter was present during the history-taking and subsequent discussion (and for part of the physical exam) with this patient.   Isla Pence, MD 07/28/18 [phone]

## 2018-07-28 NOTE — ED Provider Notes (Signed)
[facility]ERGENC[facility] DEPARTMENT Provider Note   CSN: [id number] Arrival date & time: 07/28/18  0035     History   Chief Complaint Chief Complaint  Patient presents with  . Abdominal Pain    HPI Cindy Garrison is a 48 y.o. female.     The history is provided by the patient.  Abdominal Pain Pain location:  Suprapubic Pain quality: dull   Pain radiates to:  Does not radiate Pain severity:  Severe Onset quality:  Gradual Duration:  2 days Timing:  Constant Progression:  Unchanged Chronicity:  New Context: not recent travel   Relieved by:  Nothing Worsened by:  Nothing Ineffective treatments:  None tried (no bowel movement in 4 days) Associated symptoms: constipation   Associated symptoms: no anorexia, no chest pain, no cough, no diarrhea, no dysuria, no fever, no nausea and no shortness of breath   Risk factors: no alcohol abuse and has not had multiple surgeries     Past Medical History:  Diagnosis Date  . #[id number]   . Acne   . Back pain   . Essential hypertension   . Seasonal allergies     Patient Active Problem List   Diagnosis Date Noted  . Fibroid uterus 03/26/2015  . DUB (dysfunctional uterine bleeding) 03/26/2015  . Iron deficiency anemia due to chronic blood loss 11/22/2014  . Absolute anemia   . Syncope 09/16/2014  . Anemia 09/16/2014  . Menorrhagia 09/16/2014  . Schizoaffective disorder ([place]) 09/16/2014  . Syncope and collapse 09/16/2014  . Back pain   . Essential hypertension   . Allergic rhinitis 09/13/2006    Past Surgical History:  Procedure Laterality Date  . DILITATION & CURRETTAGE/HYSTROSCOPY WITH NOVASURE ABLATION N/A 12/22/2017   Procedure: DILATATION & CURETTAGE/ HYSTEROSCOPY WITH ENDOMETRIAL FIBROID AND  NOVASURE ABLATION;  Surgeon: Lavonia Drafts, MD;  Location: [facility];  Service: Gynecology;  Laterality: N/A;     OB History    Gravida  3   Para  0   Term  0   Preterm  0   AB  3   Living   0     SAB  3   TAB  0   Ectopic  0   Multiple  0   Live Births               Home Medications    Prior to Admission medications   Medication Sig Start Date End Date Taking? Authorizing Provider  acetaminophen (TYLENOL) 500 MG tablet Take 500-1,000 mg by mouth every 6 (six) hours as needed for mild pain.     [provider]  amLODipine (NORVASC) 5 MG tablet Take 1 tablet (5 mg total) by mouth daily. 11/03/17   Fulp, Cammie, MD  atorvastatin (LIPITOR) 20 MG tablet Take 1 tablet (20 mg total) by mouth daily. To lower cholesterol 11/16/17   Fulp, Cammie, MD  diclofenac sodium (VOLTAREN) 1 % GEL Apply 2 g topically 4 (four) times daily. As needed for pain relief 03/15/18   Fulp, Cammie, MD  haloperidol (HALDOL) 1 MG tablet Take 2 mg by mouth at bedtime.    [provider]  hydrochlorothiazide (HYDRODIURIL) 25 MG tablet Take 1 tablet (25 mg total) by mouth daily. 02/04/18   Azzie Glatter, FNP  lisinopril (ZESTRIL) 40 MG tablet TAKE 1 TABLET (40 MG TOTAL) BY MOUTH DAILY. TO LOWER BLOOD PRESSURE 06/08/18   Fulp, Cammie, MD  megestrol (MEGACE) 20 MG tablet Take 2 tablets (40 mg  total) by mouth 2 (two) times daily. 03/09/18   Lavonia Drafts, MD    Family History No family history on file.  Social History Social History   Tobacco Use  . Smoking status: Never Smoker  . Smokeless tobacco: Never Used  Substance Use Topics  . Alcohol use: Not Currently    Comment: occ.  . Drug use: No     Allergies   Other   Review of Systems Review of Systems  Constitutional: Negative for appetite change and fever.  Respiratory: Negative for cough and shortness of breath.   Cardiovascular: Negative for chest pain.  Gastrointestinal: Positive for abdominal pain and constipation. Negative for anal bleeding, anorexia, diarrhea, nausea and rectal pain.  Genitourinary: Negative for dysuria.     Physical Exam Updated Vital Signs BP 126/72   Pulse 83   Temp  98.4 F (36.9 C) (Oral)   Resp 16   SpO2 99%   Physical Exam Vitals signs and nursing note reviewed.  Constitutional:      General: She is not in acute distress.    Appearance: She is normal weight.  HENT:     Head: Normocephalic and atraumatic.     Nose: Nose normal.  Eyes:     Conjunctiva/sclera: Conjunctivae normal.     Pupils: Pupils are equal, round, and reactive to light.  Neck:     Musculoskeletal: Normal range of motion and neck supple.  Cardiovascular:     Rate and Rhythm: Normal rate and regular rhythm.     Pulses: Normal pulses.     Heart sounds: Normal heart sounds.  Pulmonary:     Effort: Pulmonary effort is normal.     Breath sounds: Normal breath sounds.  Abdominal:     General: Abdomen is flat. Bowel sounds are normal.     Tenderness: There is no abdominal tenderness. There is no guarding or rebound.  Musculoskeletal: Normal range of motion.  Skin:    General: Skin is warm and dry.     Capillary Refill: Capillary refill takes less than 2 seconds.  Neurological:     General: No focal deficit present.     Mental Status: She is alert and oriented to person, place, and time.  Psychiatric:        Mood and Affect: Mood normal.        Behavior: Behavior normal.      ED Treatments / Results  Labs (all labs ordered are listed, but only abnormal results are displayed) Results for orders placed or performed during the hospital encounter of 07/28/18  Lipase, blood  Result Value Ref Range   Lipase 28 11 - 51 U/L  Comprehensive metabolic panel  Result Value Ref Range   Sodium 136 135 - 145 mmol/L   Potassium 3.2 (L) 3.5 - 5.1 mmol/L   Chloride 101 98 - 111 mmol/L   CO2 24 22 - 32 mmol/L   Glucose, Bld 106 (H) 70 - 99 mg/dL   BUN 11 6 - 20 mg/dL   Creatinine, Ser 0.61 0.44 - 1.00 mg/dL   Calcium 9.0 8.9 - 10.3 mg/dL   Total Protein 7.3 6.5 - 8.1 g/dL   Albumin 4.3 3.5 - 5.0 g/dL   AST 29 15 - 41 U/L   ALT 36 0 - 44 U/L   Alkaline Phosphatase 78 38 - 126  U/L   Total Bilirubin 0.7 0.3 - 1.2 mg/dL   GFR calc non Af Amer >60 >60 mL/min   GFR calc Af Amer >  60 >60 mL/min   Anion gap 11 5 - 15  CBC  Result Value Ref Range   WBC 8.2 4.0 - 10.5 K/uL   RBC 4.71 3.87 - 5.11 MIL/uL   Hemoglobin 13.0 12.0 - 15.0 g/dL   HCT 36.0 36.0 - 46.0 %   MCV 76.4 (L) 80.0 - 100.0 fL   MCH 27.6 26.0 - 34.0 pg   MCHC 36.1 (H) 30.0 - 36.0 g/dL   RDW 12.8 11.5 - 15.5 %   Platelets 246 150 - 400 K/uL   nRBC 0.0 0.0 - 0.2 %  Urinalysis, Routine w reflex microscopic  Result Value Ref Range   Color, Urine YELLOW YELLOW   APPearance CLEAR CLEAR   Specific Gravity, Urine 1.016 1.005 - 1.030   pH 6.0 5.0 - 8.0   Glucose, UA NEGATIVE NEGATIVE mg/dL   Hgb urine dipstick SMALL (A) NEGATIVE   Bilirubin Urine NEGATIVE NEGATIVE   Ketones, ur NEGATIVE NEGATIVE mg/dL   Protein, ur NEGATIVE NEGATIVE mg/dL   Nitrite NEGATIVE NEGATIVE   Leukocytes,Ua TRACE (A) NEGATIVE   RBC / HPF 0-5 0 - 5 RBC/hpf   WBC, UA 0-5 0 - 5 WBC/hpf   Bacteria, UA RARE (A) NONE SEEN   Squamous Epithelial / LPF 6-10 0 - 5   Mucus PRESENT   I-Stat beta hCG blood, ED  Result Value Ref Range   I-stat hCG, quantitative <5.0 <5 mIU/mL   Comment 3           No results found. Radiology No results found.  Procedures Procedures (including critical care time)  Medications Ordered in ED Medications  ketorolac (TORADOL) injection 60 mg (has no administration in time range)      Final Clinical Impressions(s) / ED Diagnoses   Signed out to Dr. Gilford Raid pending CT    Jerni Selmer, MD 07/28/18 [phone]

## 2018-07-28 NOTE — Discharge Instructions (Addendum)
Return immediately if your pain worsens, if you develop a fever, or if you have nausea or vomiting.

## 2018-07-28 NOTE — Consult Note (Signed)
[facility] Surgery Consult Note  Cindy Garrison 08/22/1970  [id number].    Requesting MD: Isla Pence Chief Complaint/Reason for Consult: abdominal pain  HPI:  Cindy Garrison is a 48yo female with h/o uterine fibroids, who presented to [facility] earlier today complaining of 2 days of abdominal pain. States that the pain is suprapubic. It has been constant. Nothing makes it worse and she has never had pain like this before. Describes the pain as sharp. Denies nausea, vomiting, fever, chills, dysuria, hematuria, diarrhea, constipation, or flank pain. Last BM was this morning and normal. Last menstrual period 12/2018. Denies vaginal bleeding. She has been tolerating a diet.  ED workup included CT scan which shows small amount of fluid in the right pericolic gutter adjacent to the air-filled appendix and tiny amount of fluid in the left pericolic gutter; fibroid uterus. WBC 8.2. LFTs and lipase WNL. U/s with trace leukocytes, no nitrites, rare bacteria, +squamous epithelial cells.  Patient received IV toradol at 0700 this morning and she states that she is feeling much better.   Abdominal surgical history: none Anticoagulants: none Nonsmoker Employment: working in [employer]   ROS: Review of Systems  Constitutional: Negative.  Negative for chills, fever and malaise/fatigue.  HENT: Negative.   Eyes: Negative.   Respiratory: Negative.   Cardiovascular: Negative.   Gastrointestinal: Positive for abdominal pain. Negative for blood in stool, constipation, diarrhea, heartburn, melena, nausea and vomiting.  Genitourinary: Negative.  Negative for dysuria, flank pain, frequency, hematuria and urgency.  Musculoskeletal: Negative.   Skin: Negative.   Neurological: Negative.     All systems reviewed and otherwise negative except for as above  No family history on file.  Past Medical History:  Diagnosis Date  . #[id number]   . Acne   . Back pain   . Essential hypertension    . Seasonal allergies     Past Surgical History:  Procedure Laterality Date  . DILITATION & CURRETTAGE/HYSTROSCOPY WITH NOVASURE ABLATION N/A 12/22/2017   Procedure: DILATATION & CURETTAGE/ HYSTEROSCOPY WITH ENDOMETRIAL FIBROID AND  NOVASURE ABLATION;  Surgeon: Lavonia Drafts, MD;  Location: [facility];  Service: Gynecology;  Laterality: N/A;    Social History:  reports that she has never smoked. She has never used smokeless tobacco. She reports previous alcohol use. She reports that she does not use drugs.  Allergies:  Allergies  Allergen Reactions  . Other Rash    Blood pressure pill (unknown name)    (Not in a hospital admission)   Prior to Admission medications   Medication Sig Start Date End Date Taking? Authorizing Provider  acetaminophen (TYLENOL) 500 MG tablet Take 500-1,000 mg by mouth every 6 (six) hours as needed for mild pain.     [provider]  amLODipine (NORVASC) 5 MG tablet Take 1 tablet (5 mg total) by mouth daily. 11/03/17   Fulp, Cammie, MD  atorvastatin (LIPITOR) 20 MG tablet Take 1 tablet (20 mg total) by mouth daily. To lower cholesterol 11/16/17   Fulp, Cammie, MD  diclofenac sodium (VOLTAREN) 1 % GEL Apply 2 g topically 4 (four) times daily. As needed for pain relief 03/15/18   Fulp, Cammie, MD  haloperidol (HALDOL) 1 MG tablet Take 2 mg by mouth at bedtime.    [provider]  hydrochlorothiazide (HYDRODIURIL) 25 MG tablet Take 1 tablet (25 mg total) by mouth daily. 02/04/18   Azzie Glatter, FNP  ibuprofen (ADVIL) 800 MG tablet Take 1 tablet (800 mg total) by mouth 3 (three) times daily.  07/28/18   Palumbo, April, MD  lisinopril (ZESTRIL) 40 MG tablet TAKE 1 TABLET (40 MG TOTAL) BY MOUTH DAILY. TO LOWER BLOOD PRESSURE 06/08/18   Fulp, Cammie, MD  megestrol (MEGACE) 20 MG tablet Take 2 tablets (40 mg total) by mouth 2 (two) times daily. 03/09/18   Lavonia Drafts, MD    Blood pressure 128/67, pulse 80, temperature 98.4 F (36.9  C), temperature source Oral, resp. rate 16, SpO2 100 %. Physical Exam: General: pleasant, WD/WN AA female who is laying in bed in NAD HEENT: head is normocephalic, atraumatic.  Sclera are noninjected.  Pupils equal and round.  Ears and nose without any masses or lesions.  Mouth is pink and moist. Dentition fair Heart: regular, rate, and rhythm.  No obvious murmurs, gallops, or rubs noted.  Palpable pedal pulses bilaterally Lungs: CTAB, no wheezes, rhonchi, or rales noted.  Respiratory effort nonlabored Abd: soft, ND, +BS, no masses, hernias, or organomegaly. Subjective global tenderness but she points to suprapubic region as the location of the majority of her pain. No peritonitis  MS: all 4 extremities are symmetrical with no cyanosis, clubbing, or edema. Skin: warm and dry with no masses, lesions, or rashes Psych: A&Ox3 with an appropriate affect. Neuro: cranial nerves grossly intact, extremity CSM intact bilaterally, normal speech  Results for orders placed or performed during the hospital encounter of 07/28/18 (from the past 48 hour(s))  Urinalysis, Routine w reflex microscopic     Status: Abnormal   Collection Time: 07/28/18 12:57 AM  Result Value Ref Range   Color, Urine YELLOW YELLOW   APPearance CLEAR CLEAR   Specific Gravity, Urine 1.016 1.005 - 1.030   pH 6.0 5.0 - 8.0   Glucose, UA NEGATIVE NEGATIVE mg/dL   Hgb urine dipstick SMALL (A) NEGATIVE   Bilirubin Urine NEGATIVE NEGATIVE   Ketones, ur NEGATIVE NEGATIVE mg/dL   Protein, ur NEGATIVE NEGATIVE mg/dL   Nitrite NEGATIVE NEGATIVE   Leukocytes,Ua TRACE (A) NEGATIVE   RBC / HPF 0-5 0 - 5 RBC/hpf   WBC, UA 0-5 0 - 5 WBC/hpf   Bacteria, UA RARE (A) NONE SEEN   Squamous Epithelial / LPF 6-10 0 - 5   Mucus PRESENT     Comment: Performed at [facility], [address], [place], [place] [address]  Lipase, blood     Status: None   Collection Time: 07/28/18  1:07 AM  Result Value Ref Range   Lipase 28 11 - 51 U/L     Comment: Performed at [facility] [address], [place], [place] [address]  Comprehensive metabolic panel     Status: Abnormal   Collection Time: 07/28/18  1:07 AM  Result Value Ref Range   Sodium 136 135 - 145 mmol/L   Potassium 3.2 (L) 3.5 - 5.1 mmol/L   Chloride 101 98 - 111 mmol/L   CO2 24 22 - 32 mmol/L   Glucose, Bld 106 (H) 70 - 99 mg/dL   BUN 11 6 - 20 mg/dL   Creatinine, Ser 0.61 0.44 - 1.00 mg/dL   Calcium 9.0 8.9 - 10.3 mg/dL   Total Protein 7.3 6.5 - 8.1 g/dL   Albumin 4.3 3.5 - 5.0 g/dL   AST 29 15 - 41 U/L   ALT 36 0 - 44 U/L   Alkaline Phosphatase 78 38 - 126 U/L   Total Bilirubin 0.7 0.3 - 1.2 mg/dL   GFR calc non Af Amer >60 >60 mL/min   GFR calc  Af Amer >60 >60 mL/min   Anion gap 11 5 - 15    Comment: Performed at [facility] [address], [place], [place] [address]  CBC     Status: Abnormal   Collection Time: 07/28/18  1:07 AM  Result Value Ref Range   WBC 8.2 4.0 - 10.5 K/uL   RBC 4.71 3.87 - 5.11 MIL/uL   Hemoglobin 13.0 12.0 - 15.0 g/dL   HCT 36.0 36.0 - 46.0 %   MCV 76.4 (L) 80.0 - 100.0 fL   MCH 27.6 26.0 - 34.0 pg   MCHC 36.1 (H) 30.0 - 36.0 g/dL   RDW 12.8 11.5 - 15.5 %   Platelets 246 150 - 400 K/uL   nRBC 0.0 0.0 - 0.2 %    Comment: Performed at [facility], [place] [address] [place], [place] [address]  I-Stat beta hCG blood, ED     Status: None   Collection Time: 07/28/18  1:57 AM  Result Value Ref Range   I-stat hCG, quantitative <5.0 <5 mIU/mL   Comment 3            Comment:   GEST. AGE      CONC.  (mIU/mL)   <=1 WEEK        5 - 50     2 WEEKS       50 - 500     3 WEEKS       100 - 10,000     4 WEEKS     1,000 - 30,000        FEMALE AND NON-PREGNANT FEMALE:     LESS THAN 5 mIU/mL    Ct Renal Stone Study  Result Date: 07/28/2018 CLINICAL DATA:  Left flank pain. EXAM: CT ABDOMEN AND PELVIS WITHOUT CONTRAST TECHNIQUE: Multidetector CT imaging of the abdomen and pelvis was performed following the standard protocol  without IV contrast. COMPARISON:  None. FINDINGS: Lower chest: Normal. Hepatobiliary: No focal liver abnormality is seen. No gallstones, gallbladder wall thickening, or biliary dilatation. Pancreas: Unremarkable. No pancreatic ductal dilatation or surrounding inflammatory changes. Spleen: Normal in size without focal abnormality. Adrenals/Urinary Tract: Adrenal glands are unremarkable. Kidneys are normal, without renal calculi, focal lesion, or hydronephrosis. Bladder is unremarkable. Stomach/Bowel: There is a small amount of fluid in the right pericolic gutter adjacent to the air-filled appendix. There is also a tiny amount of fluid in the left pericolic gutter. The bowel appears normal. Vascular/Lymphatic: No significant vascular findings are present. No enlarged abdominal or pelvic lymph nodes. Reproductive: The uterus is diffusely enlarged. There is a lobulated contour at the fundus of the uterus consistent with fibroids. There is blood in the endometrial cavity. Ovaries appear normal. Other: No abdominal wall hernia or abnormality. No abdominopelvic ascites. Musculoskeletal: No acute or significant osseous findings. IMPRESSION: 1. There is a small amount of fluid in the right pericolic gutter adjacent to the air-filled appendix. There is also a tiny amount of fluid in the left pericolic gutter. Findings are nonspecific but could represent early appendicitis. 2. Fibroid uterus. 3. There is blood in the endometrial cavity. Electronically Signed   By: Lorriane Shire M.D.   On: 07/28/2018 07:26      Assessment/Plan  Uterine fibroids Suprapubic abdominal pain - CT scan shows small amount of fluid in the right pericolic gutter adjacent to the appendix and in the left pericolic gutter. Appendix is air-filled likely indicating a normal appendix. She has no leukocytosis and is completely nontender  on abdominal exam now. Pain was suprapubic and she has no associated symptoms.  Low suspicion for appendicitis.  Would not recommend surgery. Could consider medical admission for observation, or discharge home with strict return precautions.   Wellington Hampshire, [facility] Surgery 07/28/2018, 9:50 AM Pager: [phone] Mon-Thurs 7:00 am-4:30 pm Fri 7:00 am -11:30 AM Sat-Sun 7:00 am-11:30 am

## 2018-08-09 MED FILL — DICLOFENAC SODIUM 1 % GEL: 1 | 12 days supply | Qty: 100 | Fill #4

## 2018-08-09 MED FILL — HALOPERIDOL 2 MG TABLET: 2 | 30 days supply | Qty: 30 | Fill #1

## 2018-08-09 MED FILL — ATORVASTATIN 20 MG TABLET: 20 | 30 days supply | Qty: 30 | Fill #6

## 2018-08-09 MED FILL — LISINOPRIL 40 MG TABLET: 40 | 30 days supply | Qty: 30 | Fill #2

## 2018-08-09 MED FILL — AMLODIPINE BESYLATE 5 MG TA: 5 | 30 days supply | Qty: 30 | Fill #2

## 2018-08-09 MED FILL — HYDROCHLOROTHIAZIDE 25 MG T: 25 | 30 days supply | Qty: 30 | Fill #5

## 2018-08-23 NOTE — Progress Notes (Deleted)
Patient ID: Cindy Garrison, female   DOB: 02-Feb-1970, 48 y.o.   MRN: [id number]  Virtual Visit via Telephone Note  I connected with Cindy Garrison on 08/24/18 at 10:30 AM EDT by telephone and verified that I am speaking with the correct person using two identifiers.   I discussed the limitations, risks, security and privacy concerns of performing an evaluation and management service by telephone and the availability of in person appointments. I also discussed with the patient that there may be a patient responsible charge related to this service. The patient expressed understanding and agreed to proceed.  Patient location: My Location:  [facility] office Persons on the call:    History of Present Illness: After being seen in ED 07/28/2018 for RLQ pain.  She improved in the ED.  Fibroid uterus was found on CT.     Observations/Objective:   Assessment and Plan:   Follow Up Instructions:    I discussed the assessment and treatment plan with the patient. The patient was provided an opportunity to ask questions and all were answered. The patient agreed with the plan and demonstrated an understanding of the instructions.   The patient was advised to call back or seek an in-person evaluation if the symptoms worsen or if the condition fails to improve as anticipated.  I provided *** minutes of non-face-to-face time during this encounter.   Cindy Caldron, PA-C

## 2018-08-24 ENCOUNTER — Ambulatory Visit: Payer: Self-pay | Attending: Family Medicine | Admitting: Physician Assistant

## 2018-08-24 ENCOUNTER — Other Ambulatory Visit: Payer: Self-pay

## 2018-08-24 VITALS — BP 123/85 | HR 69 | Temp 98.3°F | Ht 60.0 in | Wt 138.8 lb

## 2018-08-24 DIAGNOSIS — Z789 Other specified health status: Secondary | ICD-10-CM

## 2018-08-24 DIAGNOSIS — R1084 Generalized abdominal pain: Secondary | ICD-10-CM

## 2018-08-24 DIAGNOSIS — Z09 Encounter for follow-up examination after completed treatment for conditions other than malignant neoplasm: Secondary | ICD-10-CM

## 2018-08-24 DIAGNOSIS — D250 Submucous leiomyoma of uterus: Secondary | ICD-10-CM

## 2018-08-24 DIAGNOSIS — E7800 Pure hypercholesterolemia, unspecified: Secondary | ICD-10-CM

## 2018-08-24 DIAGNOSIS — I10 Essential (primary) hypertension: Secondary | ICD-10-CM

## 2018-08-24 DIAGNOSIS — D509 Iron deficiency anemia, unspecified: Secondary | ICD-10-CM

## 2018-08-24 MED ORDER — CETIRIZINE HCL 10 MG PO TABS: 10.0000 mg | ORAL_TABLET | Freq: Every day | ORAL | 11 refills | Status: DC

## 2018-08-24 MED ORDER — AMLODIPINE BESYLATE 5 MG PO TABS: 5.0000 mg | ORAL_TABLET | Freq: Every day | ORAL | 6 refills | Status: DC

## 2018-08-24 MED ORDER — ATORVASTATIN CALCIUM 20 MG PO TABS: 20.0000 mg | ORAL_TABLET | Freq: Every day | ORAL | 6 refills | Status: DC

## 2018-08-24 MED ORDER — LISINOPRIL 40 MG PO TABS: 40.0000 mg | ORAL_TABLET | Freq: Every day | ORAL | 2 refills | Status: DC

## 2018-08-24 MED ORDER — IBUPROFEN 800 MG PO TABS: 800.0000 mg | ORAL_TABLET | Freq: Three times a day (TID) | ORAL | 0 refills | Status: DC | PRN

## 2018-08-24 MED ORDER — FLUTICASONE PROPIONATE 50 MCG/ACT NA SUSP: 2.0000 | Freq: Every day | NASAL | 6 refills | Status: DC

## 2018-08-24 MED ORDER — HYDROCHLOROTHIAZIDE 25 MG PO TABS: 25.0000 mg | ORAL_TABLET | Freq: Every day | ORAL | 6 refills | Status: DC

## 2018-08-24 MED FILL — IBUPROFEN 800 MG TABLET: 800 | 20 days supply | Qty: 60 | Fill #0

## 2018-08-24 MED FILL — FLUTICASONE PROP 50 MCG SPR: 50 | 30 days supply | Qty: 16 | Fill #0

## 2018-08-24 NOTE — Progress Notes (Signed)
Patient ID: Cindy Garrison, female   DOB: May 29, 1970, 48 y.o.   MRN: [id number]   Cindy Garrison, is a 48 y.o. female  QIH:[id number]  OVF:[id number]  DOB - 12/27/1970  Subjective:  Chief Complaint and HPI: Cindy Garrison is a 48 y.o. female here today for a follow up visit after being seen in the ED 07/28/2018 for lower abdominal pain.  Pain comes and goes.  CT showed possible early appendicitis but pain began to improve and didn't progress.  Other findings on CT abdomen were fibroids in the uterus.  No period since December. No changes in BM.  No melena/hematochezia.  No change in appetite. No urinary s/sx.  No N/V/D/C.  Ibuprofen helps the pain.  Pain feels like menstrual cramps and comes and goes  Pain started about 1 month ago.    ED/Hospital notes reviewed.    ROS:   Constitutional:  No f/c, No night sweats, No unexplained weight loss. EENT:  No vision changes, No blurry vision, No hearing changes. No mouth, throat, or ear problems.  Respiratory: No cough, No SOB Cardiac: No CP, no palpitations GI:  + abd pain, No N/V/D. GU: No Urinary s/sx Musculoskeletal: No joint pain Neuro: No headache, no dizziness, no motor weakness.  Skin: No rash Endocrine:  No polydipsia. No polyuria.  Psych: Denies SI/HI  No problems updated.  ALLERGIES: Allergies  Allergen Reactions  . Other Rash    Blood pressure pill (unknown name)    PAST MEDICAL HISTORY: Past Medical History:  Diagnosis Date  . #[id number]   . Acne   . Back pain   . Essential hypertension   . Seasonal allergies     MEDICATIONS AT HOME: Prior to Admission medications   Medication Sig Start Date End Date Taking? Authorizing Provider  acetaminophen (TYLENOL) 500 MG tablet Take 500-1,000 mg by mouth every 6 (six) hours as needed for mild pain.    Yes [provider]  amLODipine (NORVASC) 5 MG tablet Take 1 tablet (5 mg total) by mouth daily. 08/24/18  Yes Freeman Caldron M, PA-C  atorvastatin (LIPITOR) 20  MG tablet Take 1 tablet (20 mg total) by mouth daily. To lower cholesterol 08/24/18  Yes Argentina Donovan, PA-C  diclofenac sodium (VOLTAREN) 1 % GEL Apply 2 g topically 4 (four) times daily. As needed for pain relief 03/15/18  Yes Fulp, Cammie, MD  haloperidol (HALDOL) 1 MG tablet Take 2 mg by mouth at bedtime.   Yes [provider]  hydrochlorothiazide (HYDRODIURIL) 25 MG tablet Take 1 tablet (25 mg total) by mouth daily. 08/24/18  Yes Freeman Caldron M, PA-C  ibuprofen (ADVIL) 800 MG tablet Take 1 tablet (800 mg total) by mouth every 8 (eight) hours as needed. 08/24/18  Yes Freeman Caldron M, PA-C  lisinopril (ZESTRIL) 40 MG tablet Take 1 tablet (40 mg total) by mouth daily. To lower blood pressure 08/24/18  Yes Raejean Swinford M, PA-C  megestrol (MEGACE) 20 MG tablet Take 2 tablets (40 mg total) by mouth 2 (two) times daily. 03/09/18  Yes Lavonia Drafts, MD     Objective:  EXAM:   Vitals:   08/24/18 1119  BP: 123/85  Pulse: 69  Temp: 98.3 F (36.8 C)  TempSrc: Oral  SpO2: 100%  Weight: 138 lb 12.8 oz (63 kg)  Height: 5' (1.524 m)    General appearance : A&OX3. NAD. Non-toxic-appearing HEENT: Atraumatic and Normocephalic.  PERRLA. EOM intact.   Chest/Lungs:  Breathing-non-labored, Good air entry bilaterally, breath sounds normal without  rales, rhonchi, or wheezing  CVS: S1 S2 regular, no murmurs, gallops, rubs  Abdomen: Bowel sounds present, Non tender and not distended with no gaurding, rigidity or rebound. Extremities: Bilateral Lower Ext shows no edema, both legs are warm to touch with = pulse throughout Neurology:  CN II-XII grossly intact, Non focal.   Psych:  TP linear. J/I WNL. Normal speech. Appropriate eye contact and affect.  Skin:  No Rash  Data Review Lab Results  Component Value Date   HGBA1C 5.5 01/24/2018   HGBA1C 4.8 09/12/2017     Assessment & Plan   1. Iron deficiency anemia, unspecified iron deficiency anemia type - CBC with  Differential/Platelet  2. Submucous leiomyoma of uterus Likely source of pain - Ambulatory referral to Gynecology - ibuprofen (ADVIL) 800 MG tablet; Take 1 tablet (800 mg total) by mouth every 8 (eight) hours as needed.  Dispense: 60 tablet; Refill: 0  3. Generalized abdominal pain - Comprehensive metabolic panel - TSH - Vitamin D, 25-hydroxy - H. pylori breath test - Ambulatory referral to Gynecology - ibuprofen (ADVIL) 800 MG tablet; Take 1 tablet (800 mg total) by mouth every 8 (eight) hours as needed.  Dispense: 60 tablet; Refill: 0  4. Pure hypercholesterolemia - atorvastatin (LIPITOR) 20 MG tablet; Take 1 tablet (20 mg total) by mouth daily. To lower cholesterol  Dispense: 30 tablet; Refill: 6  5. Essential hypertension Controlled- - lisinopril (ZESTRIL) 40 MG tablet; Take 1 tablet (40 mg total) by mouth daily. To lower blood pressure  Dispense: 30 tablet; Refill: 2 - hydrochlorothiazide (HYDRODIURIL) 25 MG tablet; Take 1 tablet (25 mg total) by mouth daily.  Dispense: 30 tablet; Refill: 6 - amLODipine (NORVASC) 5 MG tablet; Take 1 tablet (5 mg total) by mouth daily.  Dispense: 30 tablet; Refill: 6  6. Encounter for examination following treatment at hospital  7. Language barrier [employer] interpreters used and additional time performing visit was required.  Patient have been counseled extensively about nutrition and exercise  Return in about 3 months (around 11/24/2018) for pcp for chronic conditions.  The patient was given clear instructions to go to ER or return to medical center if symptoms don't improve, worsen or new problems develop. The patient verbalized understanding. The patient was told to call to get lab results if they haven't heard anything in the next week.     Freeman Caldron, PA-C [facility] [place], [place]   08/24/2018, 11:44 AM

## 2018-08-25 ENCOUNTER — Other Ambulatory Visit: Payer: Self-pay | Admitting: Physician Assistant

## 2018-08-25 ENCOUNTER — Encounter: Payer: Self-pay | Admitting: Family Medicine

## 2018-08-25 ENCOUNTER — Ambulatory Visit (INDEPENDENT_AMBULATORY_CARE_PROVIDER_SITE_OTHER): Payer: Self-pay | Admitting: Family Medicine

## 2018-08-25 VITALS — BP 132/83 | HR 74 | Temp 98.1°F | Wt 139.9 lb

## 2018-08-25 DIAGNOSIS — R102 Pelvic and perineal pain: Secondary | ICD-10-CM

## 2018-08-25 DIAGNOSIS — E559 Vitamin D deficiency, unspecified: Secondary | ICD-10-CM

## 2018-08-25 DIAGNOSIS — N938 Other specified abnormal uterine and vaginal bleeding: Secondary | ICD-10-CM

## 2018-08-25 LAB — CBC WITH DIFFERENTIAL/PLATELET
Basophils Absolute: 0 10*3/uL (ref 0.0–0.2)
Basos: 0 %
EOS (ABSOLUTE): 0.3 10*3/uL (ref 0.0–0.4)
Eos: 4 %
Hematocrit: 33.3 % — ABNORMAL LOW (ref 34.0–46.6)
Hemoglobin: 11.3 g/dL (ref 11.1–15.9)
Immature Grans (Abs): 0 10*3/uL (ref 0.0–0.1)
Immature Granulocytes: 0 %
Lymphocytes Absolute: 2 10*3/uL (ref 0.7–3.1)
Lymphs: 24 %
MCH: 27.2 pg (ref 26.6–33.0)
MCHC: 33.9 g/dL (ref 31.5–35.7)
MCV: 80 fL (ref 79–97)
Monocytes Absolute: 0.7 10*3/uL (ref 0.1–0.9)
Monocytes: 8 %
Neutrophils Absolute: 5.4 10*3/uL (ref 1.4–7.0)
Neutrophils: 64 %
Platelets: 224 10*3/uL (ref 150–450)
RBC: 4.16 x10E6/uL (ref 3.77–5.28)
RDW: 13.7 % (ref 11.7–15.4)
WBC: 8.4 10*3/uL (ref 3.4–10.8)

## 2018-08-25 LAB — COMPREHENSIVE METABOLIC PANEL
ALT: 12 IU/L (ref 0–32)
AST: 13 IU/L (ref 0–40)
Albumin/Globulin Ratio: 1.7 (ref 1.2–2.2)
Albumin: 4.7 g/dL (ref 3.8–4.8)
Alkaline Phosphatase: 63 IU/L (ref 39–117)
BUN/Creatinine Ratio: 12 (ref 9–23)
BUN: 7 mg/dL (ref 6–24)
Bilirubin Total: 0.6 mg/dL (ref 0.0–1.2)
CO2: 22 mmol/L (ref 20–29)
Calcium: 9.1 mg/dL (ref 8.7–10.2)
Chloride: 99 mmol/L (ref 96–106)
Creatinine, Ser: 0.58 mg/dL (ref 0.57–1.00)
GFR calc Af Amer: 124 mL/min/{1.73_m2} (ref >59)
GFR calc non Af Amer: 108 mL/min/{1.73_m2} (ref >59)
Globulin, Total: 2.8 g/dL (ref 1.5–4.5)
Glucose: 76 mg/dL (ref 65–99)
Potassium: 3.7 mmol/L (ref 3.5–5.2)
Sodium: 139 mmol/L (ref 134–144)
Total Protein: 7.5 g/dL (ref 6.0–8.5)

## 2018-08-25 LAB — TSH: TSH: 1.84 u[IU]/mL (ref 0.450–4.500)

## 2018-08-25 LAB — H. PYLORI BREATH TEST: H pylori Breath Test: POSITIVE — AB

## 2018-08-25 LAB — VITAMIN D 25 HYDROXY (VIT D DEFICIENCY, FRACTURES): Vit D, 25-Hydroxy: 5.3 ng/mL — ABNORMAL LOW (ref 30.0–100.0)

## 2018-08-25 MED ORDER — VITAMIN D (ERGOCALCIFEROL) 1.25 MG (50000 UNIT) PO CAPS: 50000.0000 [IU] | ORAL_CAPSULE | ORAL | 0 refills | Status: DC

## 2018-08-25 NOTE — Assessment & Plan Note (Signed)
No longer bleeding and off her Megace.--may resume if bleeding recurs.

## 2018-08-25 NOTE — Progress Notes (Signed)
  Video [place] interpreter: Angelica used  Subjective:    Patient ID: Cindy Garrison is a 48 y.o. female presenting with Abdominal Pain  on 08/25/2018  HPI: Here for f/u. She is s/p endometrial ablation and then with continued bleeding, on Megace and underwent D and C last fall with negative pathology. She had been on Megace and then stopped it until the pain came back. States she had stopped it for 4-5 months. She has been having abdominal pain in the lower abdomen. This has been going on for 1 months. Comes and goes. Worse when she works hard. If she takes Ibuprofen it goes away. Lasts for 1 week. It is located in the midline, it is cramping.  Reports abdominal pain with urinating. Denies bleeding.  Review of Systems  Constitutional: Negative for chills and fever.  Respiratory: Negative for shortness of breath.   Cardiovascular: Negative for chest pain.  Gastrointestinal: Negative for abdominal pain, nausea and vomiting.  Genitourinary: Negative for dysuria.  Skin: Negative for rash.      Objective:    BP 132/83   Pulse 74   Temp 98.1 F (36.7 C)   Wt 139 lb 14.4 oz (63.5 kg)   BMI 27.32 kg/m  Physical Exam Constitutional:      General: She is not in acute distress.    Appearance: She is well-developed.  HENT:     Head: Normocephalic and atraumatic.  Eyes:     General: No scleral icterus. Neck:     Musculoskeletal: Neck supple.  Cardiovascular:     Rate and Rhythm: Normal rate.  Pulmonary:     Effort: Pulmonary effort is normal.  Abdominal:     Palpations: Abdomen is soft.     Tenderness: There is abdominal tenderness in the right lower quadrant, suprapubic area and left lower quadrant. There is no guarding or rebound.  Skin:    General: Skin is warm and dry.  Neurological:     Mental Status: She is alert and oriented to person, place, and time.         Assessment & Plan:   Problem List Items Addressed This Visit      Unprioritized   DUB (dysfunctional  uterine bleeding)    No longer bleeding and off her Megace.--may resume if bleeding recurs.       Other Visit Diagnoses    Pelvic pain    -  Primary   will check u/s to r/o ovarian cyst/issue or hematometra.   Relevant Orders   US PELVIS TRANSVAGINAL NON-OB (TV ONLY)      Total face-to-face time with patient: 15 minutes. Over 50% of encounter was spent on counseling and coordination of care. Return in about 4 weeks (around 09/22/2018) for virtual with Dr. Ihor Dow.  Cindy Garrison 08/25/2018 11:20 AM

## 2018-08-30 ENCOUNTER — Other Ambulatory Visit: Payer: Self-pay | Admitting: Physician Assistant

## 2018-08-30 MED ORDER — OMEPRAZOLE 20 MG PO CPDR: 20.0000 mg | DELAYED_RELEASE_CAPSULE | Freq: Two times a day (BID) | ORAL | 0 refills | Status: DC

## 2018-08-30 MED ORDER — CLARITHROMYCIN 500 MG PO TABS: 500.0000 mg | ORAL_TABLET | Freq: Two times a day (BID) | ORAL | 0 refills | Status: DC

## 2018-08-30 MED ORDER — AMOXICILLIN 500 MG PO CAPS: 1000.0000 mg | ORAL_CAPSULE | Freq: Two times a day (BID) | ORAL | 0 refills | Status: DC

## 2018-08-30 MED FILL — OMEPRAZOLE 20 MG CAPSULE DR: 20 | 30 days supply | Qty: 60 | Fill #0

## 2018-08-30 MED FILL — CLARITHROMYCIN 500 MG TAB: 500 | 14 days supply | Qty: 28 | Fill #0

## 2018-08-30 MED FILL — AMOXICILLIN 500 MG CAPSULE: 500 | 14 days supply | Qty: 56 | Fill #0

## 2018-09-05 ENCOUNTER — Other Ambulatory Visit: Payer: Self-pay

## 2018-09-05 ENCOUNTER — Ambulatory Visit (HOSPITAL_COMMUNITY)
Admission: RE | Admit: 2018-09-05 | Discharge: 2018-09-05 | Disposition: A | Discharge status: Home / Self Care | Payer: Self-pay | Source: Ambulatory Visit | Attending: Family Medicine | Admitting: Family Medicine

## 2018-09-05 DIAGNOSIS — R102 Pelvic and perineal pain: Secondary | ICD-10-CM | POA: Insufficient documentation

## 2018-09-06 ENCOUNTER — Encounter: Payer: Self-pay | Admitting: *Deleted

## 2018-09-07 MED FILL — AMLODIPINE BESYLATE 5 MG TA: 5 | 30 days supply | Qty: 30 | Fill #0

## 2018-09-07 MED FILL — VIT D2 1.25 MG (50,000 UNIT: 1.25 MG | 28 days supply | Qty: 4 | Fill #0

## 2018-09-07 MED FILL — ATORVASTATIN 20 MG TABLET: 20 | 30 days supply | Qty: 30 | Fill #0

## 2018-09-07 MED FILL — HYDROCHLOROTHIAZIDE 25 MG T: 25 | 30 days supply | Qty: 30 | Fill #0

## 2018-09-15 MED FILL — LISINOPRIL 40 MG TABLET: 40 | 30 days supply | Qty: 30 | Fill #0

## 2018-09-22 ENCOUNTER — Telehealth: Payer: Self-pay | Admitting: Obstetrics & Gynecology

## 2018-09-22 NOTE — Telephone Encounter (Signed)
Attempted to contact patient w/ [place] interpreter ID# [phone] about her appointment on 9/8 @ 8:55. No answer, interpreter left voicemail instructing patient that the appointment is a virtual visit. Patient instructed to download the [facility] app if not already done so. Patient instructed to give the office a call with any concerns.

## 2018-09-26 ENCOUNTER — Encounter: Payer: Self-pay | Admitting: Obstetrics & Gynecology

## 2018-09-26 ENCOUNTER — Other Ambulatory Visit: Payer: Self-pay

## 2018-09-26 ENCOUNTER — Telehealth: Payer: Self-pay | Admitting: Obstetrics & Gynecology

## 2018-09-26 NOTE — Telephone Encounter (Signed)
Attempted to contact patient about her missed appointment on 9/8 w/ [place] interpreter ID# [phone]. No answer, interpreter left voicemail for patient to give the office a call back to be rescheduled. No show letter mailed

## 2018-09-26 NOTE — Progress Notes (Signed)
Called pt @ [phone] and LM that I am calling in regards to her appt @ 0855 if she could please call the office back.  I will call back in 15 min in which if I do not reach you I will have to request that she reschedules her appt.   Called pt @ 0915 and LM that I am calling in regards to appt that this is my second attempt in trying to reach her.  I have to request that she reschedules her appt.   Mel Almond, RN

## 2018-10-05 MED FILL — DICLOFENAC SODIUM 1% GEL: 1 | 12 days supply | Qty: 100 | Fill #5

## 2018-10-05 MED FILL — ?AMLODIPINE BESYLATE 5MG TA: 5 | 30 days supply | Qty: 30 | Fill #1

## 2018-10-05 MED FILL — ?HYDROCHLOROTHIAZIDE 25MG T: 25 | 30 days supply | Qty: 30 | Fill #1

## 2018-10-09 MED FILL — VIT D2 1.25 MG (50,000 UNIT: 1.25 MG | 84 days supply | Qty: 12 | Fill #1

## 2018-10-10 ENCOUNTER — Other Ambulatory Visit: Payer: Self-pay | Admitting: Pharmacist

## 2018-10-10 DIAGNOSIS — D250 Submucous leiomyoma of uterus: Secondary | ICD-10-CM

## 2018-10-10 DIAGNOSIS — R1084 Generalized abdominal pain: Secondary | ICD-10-CM

## 2018-10-10 MED ORDER — IBUPROFEN 800 MG PO TABS: 800.0000 mg | ORAL_TABLET | Freq: Three times a day (TID) | ORAL | 0 refills | Status: DC | PRN

## 2018-10-10 MED FILL — ?IBUPROFEN 800 MG TABS: AMNEAL | 20 days supply | Qty: 60 | Fill #0

## 2018-10-19 MED FILL — ATORVASTATIN CALCIUM 20 MG: 20 | 30 days supply | Qty: 30 | Fill #1

## 2018-10-19 MED FILL — LISINOPRIL 40 MG TABLET: 40 | 30 days supply | Qty: 30 | Fill #1

## 2018-10-19 MED FILL — HALOPERIDOL 2 MG TABLET: 2 | 30 days supply | Qty: 30 | Fill #2

## 2018-11-03 ENCOUNTER — Other Ambulatory Visit: Payer: Self-pay | Admitting: Family Medicine

## 2018-11-03 DIAGNOSIS — D250 Submucous leiomyoma of uterus: Secondary | ICD-10-CM

## 2018-11-03 DIAGNOSIS — R1084 Generalized abdominal pain: Secondary | ICD-10-CM

## 2018-11-06 ENCOUNTER — Other Ambulatory Visit: Payer: Self-pay | Admitting: Pharmacist

## 2018-11-06 DIAGNOSIS — R1084 Generalized abdominal pain: Secondary | ICD-10-CM

## 2018-11-06 DIAGNOSIS — D250 Submucous leiomyoma of uterus: Secondary | ICD-10-CM

## 2018-11-06 MED ORDER — IBUPROFEN 800 MG PO TABS: 800.0000 mg | ORAL_TABLET | Freq: Three times a day (TID) | ORAL | 0 refills | Status: DC | PRN

## 2018-11-06 MED FILL — IBUPROFEN 800 MG TABLET: 800 | 20 days supply | Qty: 60 | Fill #0

## 2018-11-10 MED FILL — ?AMLODIPINE BESYLATE 5MG TA: 5 | 30 days supply | Qty: 30 | Fill #2

## 2018-11-10 MED FILL — ?HYDROCHLOROTHIAZIDE 25MG T: 25 | 30 days supply | Qty: 30 | Fill #2

## 2018-11-22 MED FILL — LISINOPRIL 40 MG TABLET: 40 | 30 days supply | Qty: 30 | Fill #2

## 2018-11-24 ENCOUNTER — Other Ambulatory Visit: Payer: Self-pay

## 2018-11-24 ENCOUNTER — Ambulatory Visit: Payer: Self-pay | Attending: Family Medicine | Admitting: Family Medicine

## 2018-12-11 MED FILL — ?ATORVASTATIN 20 MG TABLET: 20 | 30 days supply | Qty: 30 | Fill #2

## 2018-12-11 MED FILL — ?AMLODIPINE BESYLATE 5 MG T: 5 MG | 30 days supply | Qty: 30 | Fill #3

## 2018-12-11 MED FILL — ?HYDROCHLOROTHIAZIDE 25MG T: 25 | 30 days supply | Qty: 30 | Fill #3

## 2018-12-18 ENCOUNTER — Other Ambulatory Visit: Payer: Self-pay | Admitting: Family Medicine

## 2018-12-18 DIAGNOSIS — R1084 Generalized abdominal pain: Secondary | ICD-10-CM

## 2018-12-18 DIAGNOSIS — D250 Submucous leiomyoma of uterus: Secondary | ICD-10-CM

## 2018-12-18 MED FILL — ?IBUPROFEN 800 MG TABS: AMNEAL | 20 days supply | Qty: 60 | Fill #0

## 2018-12-19 ENCOUNTER — Other Ambulatory Visit: Payer: Self-pay | Admitting: Physician Assistant

## 2018-12-19 DIAGNOSIS — I10 Essential (primary) hypertension: Secondary | ICD-10-CM

## 2018-12-19 MED FILL — LISINOPRIL 40 MG TABLET: 40 | 30 days supply | Qty: 30 | Fill #0

## 2018-12-19 MED FILL — ?HALOPERIDOL 2 MG TABLET: 2 | 30 days supply | Qty: 30 | Fill #0

## 2018-12-28 ENCOUNTER — Ambulatory Visit: Payer: Self-pay | Attending: Family Medicine | Admitting: Family Medicine

## 2018-12-28 ENCOUNTER — Other Ambulatory Visit: Payer: Self-pay

## 2018-12-28 ENCOUNTER — Encounter: Payer: Self-pay | Admitting: Family Medicine

## 2018-12-28 DIAGNOSIS — R0789 Other chest pain: Secondary | ICD-10-CM

## 2018-12-28 DIAGNOSIS — R1030 Lower abdominal pain, unspecified: Secondary | ICD-10-CM

## 2018-12-28 DIAGNOSIS — K219 Gastro-esophageal reflux disease without esophagitis: Secondary | ICD-10-CM

## 2018-12-28 DIAGNOSIS — R300 Dysuria: Secondary | ICD-10-CM

## 2018-12-28 LAB — POCT URINALYSIS DIP (CLINITEK)
Bilirubin, UA: NEGATIVE
Blood, UA: NEGATIVE
Glucose, UA: NEGATIVE mg/dL
Ketones, POC UA: NEGATIVE mg/dL
Leukocytes, UA: NEGATIVE
Nitrite, UA: NEGATIVE
POC PROTEIN,UA: NEGATIVE
Spec Grav, UA: 1.015
Urobilinogen, UA: 0.2 U/dL
pH, UA: 6

## 2018-12-28 MED ORDER — OMEPRAZOLE 40 MG PO CPDR: 40.0000 mg | DELAYED_RELEASE_CAPSULE | Freq: Two times a day (BID) | ORAL | 2 refills | Status: DC

## 2018-12-28 MED ORDER — CIPROFLOXACIN HCL 500 MG PO TABS: 500.0000 mg | ORAL_TABLET | Freq: Two times a day (BID) | ORAL | 0 refills | Status: DC

## 2018-12-28 MED FILL — CIPROFLOXACIN HCL 500 MG TA: 500 | 3 days supply | Qty: 6 | Fill #0

## 2018-12-28 MED FILL — OMEPRAZOLE DR 40 MG CAPSULE: 40 | 30 days supply | Qty: 60 | Fill #0

## 2018-12-28 NOTE — Progress Notes (Signed)
Virtual Visit via Telephone Note  I connected with Cindy Garrison on 12/28/18 at 10:30 AM EST by telephone and verified that I am speaking with the correct person using two identifiers.   I discussed the limitations, risks, security and privacy concerns of performing an evaluation and management service by telephone and the availability of in person appointments. I also discussed with the patient that there may be a patient responsible charge related to this service. The patient expressed understanding and agreed to proceed.  Patient Location: Work Engineer, structural: [facility] Office Others participating in call: call initiated by [place], [place] who obtained an interpreter through [place] [employer] due to a language barrier with the patient   History of Present Illness:      48 yo female with complaint of onset of pain in the mid upper chest (above level of the breast) and pain in her abdomen which occurs with bending over.  She also has complaint of burning with urination.  She also has back pain for which she has had to take ibuprofen.  Patient believes that she should have been seen in the office and not by telephone for today's visit. (Patient was working while attempting to perform telemedicine appointment and conversation was often interrupted while patient was working).  Patient states that she thinks that she just needs refills of medication.  Interpreter asked patient several times if she had any other symptoms associated with her chest pain or abdominal pain such as nausea or sweating but no response received.  Through interpreter, patient was asked if she can come into the office today for blood work and possible quick physical examination and patient states that she can come at noon and she was informed as well that office closes at 12:30 until 1:30 PM for lunch.   Past Medical History:  Diagnosis Date  . [id number]   . Acne   . Back pain   . Essential hypertension   .  Seasonal allergies     Past Surgical History:  Procedure Laterality Date  . DILITATION & CURRETTAGE/HYSTROSCOPY WITH NOVASURE ABLATION N/A 12/22/2017   Procedure: DILATATION & CURETTAGE/ HYSTEROSCOPY WITH ENDOMETRIAL FIBROID AND  NOVASURE ABLATION;  Surgeon: Lavonia Drafts, MD;  Location: [facility];  Service: Gynecology;  Laterality: N/A;    No family history on file.  Social History   Tobacco Use  . Smoking status: Never Smoker  . Smokeless tobacco: Never Used  Substance Use Topics  . Alcohol use: Not Currently    Comment: occ.  . Drug use: No     Allergies  Allergen Reactions  . Other Rash    Blood pressure pill (unknown name)       Observations/Objective: No vital signs or physical exam conducted as visit was done via telephone Patient was able to come into the office and limited physical exam done with lungs clear auscultation, cardiovascular regular rate and rhythm and abdomen with lower abdominal distention and mild suprapubic discomfort to palpation.  When asked about the area of chest pain, patient actually points to the mid upper abdomen/epigastric area rather than the actual chest.  [employer] video interpretation system used with [place] interpreter.  Assessment and Plan: 1. Atypical chest pain Based on examination, her chest pain is more mid upper abdominal/epigastric and I suspect that the chest pain may be related to acid reflux type symptoms.  She has been encouraged to avoid foods that she knows tend to cause the symptoms as well as avoidance of late night  eating and making sure that she eats prior to taking ibuprofen.  She can try switching to Tylenol to help with pain.  She is also being prescribed omeprazole.  If she continues however to have chest pain or any concerns, she should go to the emergency department for further evaluation and treatment.  2. Lower abdominal pain; 3. Dysuria Patient with lower abdominal discomfort and complaint of dysuria.  Will have  patient come in to give urine sample for urinalysis and culture as I suspect that she may have urinary tract infection as a cause of her lower abdominal pain.  She has also had ultrasound in August showing presence of multiple uterine fibroids.  She is to continue follow-up with OB/GYN as fibroids can also contribute to her lower abdominal pain.  She will also have CBC to look for anemia or increased white blood count indicative of infection. - POCT URINALYSIS DIP (CLINITEK) - Urine Culture - CBC - Urine Culture; Future  4. Gastroesophageal reflux disease, unspecified whether esophagitis present Discussed with patient that her atypical chest pain may be related to acid reflux from her use of ibuprofen.  Will have patient start omeprazole 40 mg twice daily and also avoid known trigger foods and avoid eating within 2 hours of bedtime.  Follow Up Instructions:Return in about 2 weeks (around 01/11/2019) for lower abdominal/chest pain; ED if worse.    I discussed the assessment and treatment plan with the patient. The patient was provided an opportunity to ask questions and all were answered. The patient agreed with the plan and demonstrated an understanding of the instructions.   The patient was advised to call back or seek an in-person evaluation if the symptoms worsen or if the condition fails to improve as anticipated.  I provided 15 minutes of non-face-to-face time during this encounter.   Antony Blackbird, MD

## 2018-12-28 NOTE — Progress Notes (Signed)
Patient verified DOB Patient has eaten today Patient has taken medication today. Patient complains of pain in the belly and chest beginning yesterday. The pain is described as sharp pain when she is over working. Patient works as a Technical sales engineer.  Patient is requesting a refill on Voltaren which helps with the pain.

## 2018-12-29 ENCOUNTER — Other Ambulatory Visit: Payer: Self-pay

## 2018-12-29 ENCOUNTER — Encounter (HOSPITAL_COMMUNITY): Payer: Self-pay | Admitting: *Deleted

## 2018-12-29 ENCOUNTER — Emergency Department (HOSPITAL_COMMUNITY)
Admission: EM | Admit: 2018-12-29 | Discharge: 2018-12-29 | Disposition: A | Discharge status: Home / Self Care | Payer: Self-pay | Attending: Emergency Medicine | Admitting: Emergency Medicine

## 2018-12-29 DIAGNOSIS — Z79899 Other long term (current) drug therapy: Secondary | ICD-10-CM | POA: Insufficient documentation

## 2018-12-29 DIAGNOSIS — R102 Pelvic and perineal pain: Secondary | ICD-10-CM | POA: Insufficient documentation

## 2018-12-29 DIAGNOSIS — F259 Schizoaffective disorder, unspecified: Secondary | ICD-10-CM | POA: Insufficient documentation

## 2018-12-29 DIAGNOSIS — I10 Essential (primary) hypertension: Secondary | ICD-10-CM | POA: Insufficient documentation

## 2018-12-29 LAB — CBC
Hematocrit: 38.2 % (ref 34.0–46.6)
Hemoglobin: 12.6 g/dL (ref 11.1–15.9)
MCH: 26 pg — ABNORMAL LOW (ref 26.6–33.0)
MCHC: 33 g/dL (ref 31.5–35.7)
MCV: 79 fL (ref 79–97)
Platelets: 284 x10E3/uL (ref 150–450)
RBC: 4.85 x10E6/uL (ref 3.77–5.28)
RDW: 15 % (ref 11.7–15.4)
WBC: 8.5 x10E3/uL (ref 3.4–10.8)

## 2018-12-29 LAB — URINALYSIS, ROUTINE W REFLEX MICROSCOPIC
Bilirubin Urine: NEGATIVE
Glucose, UA: NEGATIVE mg/dL
Hgb urine dipstick: NEGATIVE
Ketones, ur: NEGATIVE mg/dL
Leukocytes,Ua: NEGATIVE
Nitrite: NEGATIVE
Protein, ur: NEGATIVE mg/dL
Specific Gravity, Urine: 1.015 (ref 1.005–1.030)
pH: 6 (ref 5.0–8.0)

## 2018-12-29 LAB — POC URINE PREG, ED: Preg Test, Ur: NEGATIVE

## 2018-12-29 MED ORDER — ACETAMINOPHEN 500 MG PO TABS
1000.0000 mg | ORAL_TABLET | Freq: Once | ORAL | Status: AC
  Administered 2018-12-29: 12:00:00 1000 mg via ORAL
  Filled 2018-12-29: qty 2

## 2018-12-29 MED ORDER — ACETAMINOPHEN 500 MG PO TABS: 1000.0000 mg | ORAL_TABLET | Freq: Four times a day (QID) | ORAL | 0 refills | Status: AC | PRN

## 2018-12-29 MED ORDER — SODIUM CHLORIDE 0.9% FLUSH: 3.0000 mL | Freq: Once | INTRAVENOUS | Status: DC

## 2018-12-29 NOTE — ED Provider Notes (Signed)
[facility] EMERGENCY DEPARTMENT Provider Note   CSN: [id number] Arrival date & time: 12/29/18  [id number]     History Chief Complaint  Patient presents with  . Abdominal Pain    Cindy Garrison is a 48 y.o. female.  48 year old female with past medical history including hypertension, uterine fibroid, dysfunctional uterine bleeding, schizoaffective disorder who presents with abdominal pain.  She initially states that the abdominal pain has been going on for the past 3 days but later clarifies that she has been having abdominal pain since her previous ED evaluation (07/28/2018.)  At that time, she was prescribed ibuprofen which she states helps her pain and allows her to work.  She notes that her abdominal pain is in her lower abdomen and it gets worse when she moves around and does a lot of activity at work.  Recently, she was prescribed 800 mg ibuprofen tablets and another medication (PPI) but she feels like the 800 mg tablets are too strong because her stomach hurts after she takes the medication.  She tries to eat before taking medication; pt explains that she used to eat roast beef at [place] or pizza at [place] before taking the medication but because of the pandemic she hasn't been able to get these foods. She requests a medication to fix the pain. No vaginal bleeding/discharge, urinary symptoms, vomiting, or fevers.  The history is provided by the patient. The history is limited by a language barrier. A language interpreter was used.  Abdominal Pain      Past Medical History:  Diagnosis Date  . [id number]   . Acne   . Back pain   . Essential hypertension   . Seasonal allergies     Patient Active Problem List   Diagnosis Date Noted  . Fibroid uterus 03/26/2015  . DUB (dysfunctional uterine bleeding) 03/26/2015  . Iron deficiency anemia due to chronic blood loss 11/22/2014  . Absolute anemia   . Syncope 09/16/2014  . Anemia 09/16/2014  . Menorrhagia  09/16/2014  . Schizoaffective disorder ([place]) 09/16/2014  . Syncope and collapse 09/16/2014  . Back pain   . Essential hypertension   . Allergic rhinitis 09/13/2006    Past Surgical History:  Procedure Laterality Date  . DILITATION & CURRETTAGE/HYSTROSCOPY WITH NOVASURE ABLATION N/A 12/22/2017   Procedure: DILATATION & CURETTAGE/ HYSTEROSCOPY WITH ENDOMETRIAL FIBROID AND  NOVASURE ABLATION;  Surgeon: Lavonia Drafts, MD;  Location: [facility];  Service: Gynecology;  Laterality: N/A;     OB History    Gravida  3   Para  0   Term  0   Preterm  0   AB  3   Living  0     SAB  3   TAB  0   Ectopic  0   Multiple  0   Live Births              No family history on file.  Social History   Tobacco Use  . Smoking status: Never Smoker  . Smokeless tobacco: Never Used  Substance Use Topics  . Alcohol use: Not Currently    Comment: occ.  . Drug use: No    Home Medications Prior to Admission medications   Medication Sig Start Date End Date Taking? Authorizing Provider  acetaminophen (TYLENOL) 500 MG tablet Take 2 tablets (1,000 mg total) by mouth every 6 (six) hours as needed for moderate pain. 12/29/18 01/28/19  , Wenda Overland, MD  amLODipine (NORVASC) 5 MG tablet Take  1 tablet (5 mg total) by mouth daily. 08/24/18   Argentina Donovan, PA-C  amoxicillin (AMOXIL) 500 MG capsule Take 2 capsules (1,000 mg total) by mouth 2 (two) times daily. 08/30/18   Argentina Donovan, PA-C  atorvastatin (LIPITOR) 20 MG tablet Take 1 tablet (20 mg total) by mouth daily. To lower cholesterol 08/24/18   Freeman Caldron M, PA-C  cetirizine (ZYRTEC) 10 MG tablet Take 1 tablet (10 mg total) by mouth daily. 08/24/18   Argentina Donovan, PA-C  ciprofloxacin (CIPRO) 500 MG tablet Take 1 tablet (500 mg total) by mouth 2 (two) times daily. 12/28/18   Fulp, Cammie, MD  clarithromycin (BIAXIN) 500 MG tablet Take 1 tablet (500 mg total) by mouth 2 (two) times daily. 08/30/18   Argentina Donovan,  PA-C  diclofenac sodium (VOLTAREN) 1 % GEL Apply 2 g topically 4 (four) times daily. As needed for pain relief 03/15/18   Fulp, Cammie, MD  fluticasone (FLONASE) 50 MCG/ACT nasal spray Place 2 sprays into both nostrils daily. 08/24/18   Argentina Donovan, PA-C  haloperidol (HALDOL) 1 MG tablet Take 2 mg by mouth at bedtime.    [provider]  hydrochlorothiazide (HYDRODIURIL) 25 MG tablet Take 1 tablet (25 mg total) by mouth daily. 08/24/18   Argentina Donovan, PA-C  lisinopril (ZESTRIL) 40 MG tablet TAKE 1 TABLET (40 MG TOTAL) BY MOUTH DAILY. TO LOWER BLOOD PRESSURE 12/19/18   Fulp, Cammie, MD  megestrol (MEGACE) 20 MG tablet Take 2 tablets (40 mg total) by mouth 2 (two) times daily. 03/09/18   Lavonia Drafts, MD  Vitamin D, Ergocalciferol, (DRISDOL) 1.25 MG (50000 UT) CAPS capsule Take 1 capsule (50,000 Units total) by mouth every 7 (seven) days. 08/25/18   Argentina Donovan, PA-C  omeprazole (PRILOSEC) 40 MG capsule Take 1 capsule (40 mg total) by mouth 2 (two) times daily before a meal. 12/28/18 12/29/18  Antony Blackbird, MD    Allergies    Other  Review of Systems   Review of Systems  Gastrointestinal: Positive for abdominal pain.   All other systems reviewed and are negative except that which was mentioned in HPI  Physical Exam Updated Vital Signs BP 134/73 (BP Location: Right Arm)   Pulse (!) 102   Temp 98 F (36.7 C) (Oral)   Resp 18   LMP 09/12/2017 (LMP Unknown)   SpO2 100%   Physical Exam Vitals and nursing note reviewed.  Constitutional:      General: She is not in acute distress.    Appearance: She is well-developed.  HENT:     Head: Normocephalic and atraumatic.  Eyes:     Conjunctiva/sclera: Conjunctivae normal.  Cardiovascular:     Rate and Rhythm: Normal rate and regular rhythm.     Heart sounds: Normal heart sounds. No murmur.  Pulmonary:     Effort: Pulmonary effort is normal.     Breath sounds: Normal breath sounds.  Abdominal:     General:  Bowel sounds are normal. There is no distension.     Palpations: Abdomen is soft.     Tenderness: There is abdominal tenderness.     Comments: TTP across lower abdomen predominantly suprapubic  Musculoskeletal:     Cervical back: Neck supple.  Skin:    General: Skin is warm and dry.  Neurological:     Mental Status: She is alert and oriented to person, place, and time.     Comments: Fluent speech  Psychiatric:  Judgment: Judgment normal.     ED Results / Procedures / Treatments   Labs (all labs ordered are listed, but only abnormal results are displayed) Labs Reviewed  URINALYSIS, [place] PREG, ED    EKG None  Radiology No results found.  Procedures Procedures (including critical care time)  Medications Ordered in ED Medications  acetaminophen (TYLENOL) tablet 1,000 mg (1,000 mg Oral Given 12/29/18 1217)    ED Course  I have reviewed the triage vital signs and the nursing notes.  Pertinent labs  that were available during my care of the patient were reviewed by me and considered in my medical decision making (see chart for details).   Patient ambulatory and well-appearing on exam with reassuring vital signs.  UA normal and urine pregnancy test negative.  Mild lower abdominal tenderness noted.  I had a long discussion with the patient with the assistance of interpreter to try to better understand her symptom pattern.  It sounds that her symptoms have been present since previous ED visit several months ago and I see multiple notes in the chart regarding follow-ups with OB/GYN for known fibroid.  She was provided with ibuprofen 800 mg tablets and PPI recently but based on her complaint of abdominal pain when taking the medication, I directed her to discontinue this medication as I am concerned about gastritis or PUD.  Instructed to switch to Tylenol and discussed max dose of 1000mg  q6h. emphasized the importance of following up with  OB/GYN (she missed appt in Sept 2020) for definitive management.  Given extensive previous work-up during last ED visit including CT of abdomen and pelvis and the chronicity of the patient's symptoms, I do not feel she needs any further imaging or work-up today.  She voiced understanding of follow-up plan.   MDM Rules/Calculators/A&P   Final Clinical Impression(s) / ED Diagnoses Final diagnoses:  Pelvic pain    Rx / DC Orders ED Discharge Orders         Ordered    acetaminophen (TYLENOL) 500 MG tablet  Every 6 hours PRN     12/29/18 1308           , Wenda Overland, MD 12/29/18 1334

## 2018-12-29 NOTE — ED Triage Notes (Signed)
Pt reports abdominal pain for the last 3 days. Denies recent fevers, vomiting. VSS.

## 2018-12-29 NOTE — ED Triage Notes (Signed)
C/o abd. Pain onset 3 days ago denies vomiting or diarrahea

## 2018-12-29 NOTE — ED Notes (Signed)
Patient verbalizes understanding of discharge instructions. Opportunity for questioning and answers were provided. Armband removed by staff, pt discharged from ED to home. Questions answered, RN stress importance of follow-up care with specialist per paperwork. Spoke to pt about daily limits for tylenol, aware she has already received 1000mg .

## 2018-12-29 NOTE — ED Notes (Signed)
Noted pt lying on floor, assisted to bed and encouraged not to lie on floor

## 2018-12-30 LAB — URINE CULTURE

## 2018-12-31 ENCOUNTER — Other Ambulatory Visit: Payer: Self-pay | Admitting: Family Medicine

## 2018-12-31 DIAGNOSIS — N3000 Acute cystitis without hematuria: Secondary | ICD-10-CM

## 2018-12-31 MED ORDER — AMOXICILLIN-POT CLAVULANATE 500-125 MG PO TABS: 1.0000 | ORAL_TABLET | Freq: Two times a day (BID) | ORAL | 0 refills | Status: AC

## 2018-12-31 NOTE — Progress Notes (Signed)
Urine culture with growth of bacteria consistent with urinary tract infection. An additional antibiotic will be sent to patient's pharmacy-[facility]- based on the urine culture results

## 2019-01-01 MED FILL — AMOX-CLAV 500-125 MG TABLET: 500-125 | 7 days supply | Qty: 14 | Fill #0

## 2019-01-04 ENCOUNTER — Encounter: Payer: Self-pay | Admitting: *Deleted

## 2019-01-08 MED FILL — AMLODIPINE BESYLATE 5 MG TA: 5 | 30 days supply | Qty: 30 | Fill #4

## 2019-01-08 MED FILL — ?HYDROCHLOROTHIAZIDE 25MG T: 25 | 30 days supply | Qty: 30 | Fill #4

## 2019-01-17 MED FILL — ?ATORVASTATIN 20 MG TABLET: 20 | 30 days supply | Qty: 30 | Fill #3

## 2019-01-22 MED FILL — LISINOPRIL 40 MG TABLET: 40 | 30 days supply | Qty: 30 | Fill #1

## 2019-01-25 ENCOUNTER — Encounter: Payer: Self-pay | Admitting: Family Medicine

## 2019-01-25 ENCOUNTER — Encounter: Payer: Self-pay | Admitting: Obstetrics and Gynecology

## 2019-01-25 ENCOUNTER — Other Ambulatory Visit: Payer: Self-pay

## 2019-01-25 ENCOUNTER — Ambulatory Visit: Payer: Self-pay | Attending: Family Medicine | Admitting: Family Medicine

## 2019-01-25 ENCOUNTER — Ambulatory Visit (INDEPENDENT_AMBULATORY_CARE_PROVIDER_SITE_OTHER): Payer: Self-pay | Admitting: Obstetrics and Gynecology

## 2019-01-25 VITALS — BP 98/67 | HR 101 | Wt 137.0 lb

## 2019-01-25 DIAGNOSIS — Z758 Other problems related to medical facilities and other health care: Secondary | ICD-10-CM

## 2019-01-25 DIAGNOSIS — Z01419 Encounter for gynecological examination (general) (routine) without abnormal findings: Secondary | ICD-10-CM

## 2019-01-25 DIAGNOSIS — Z603 Acculturation difficulty: Secondary | ICD-10-CM

## 2019-01-25 DIAGNOSIS — Z09 Encounter for follow-up examination after completed treatment for conditions other than malignant neoplasm: Secondary | ICD-10-CM

## 2019-01-25 DIAGNOSIS — N3000 Acute cystitis without hematuria: Secondary | ICD-10-CM

## 2019-01-25 DIAGNOSIS — R1030 Lower abdominal pain, unspecified: Secondary | ICD-10-CM

## 2019-01-25 DIAGNOSIS — D259 Leiomyoma of uterus, unspecified: Secondary | ICD-10-CM

## 2019-01-25 DIAGNOSIS — Z1239 Encounter for other screening for malignant neoplasm of breast: Secondary | ICD-10-CM

## 2019-01-25 DIAGNOSIS — R300 Dysuria: Secondary | ICD-10-CM

## 2019-01-25 DIAGNOSIS — N3020 Other chronic cystitis without hematuria: Secondary | ICD-10-CM

## 2019-01-25 DIAGNOSIS — Z789 Other specified health status: Secondary | ICD-10-CM

## 2019-01-25 MED ORDER — PHENAZOPYRIDINE HCL 200 MG PO TABS: 200.0000 mg | ORAL_TABLET | Freq: Three times a day (TID) | ORAL | 1 refills | Status: DC | PRN

## 2019-01-25 MED ORDER — AMOXICILLIN-POT CLAVULANATE 500-125 MG PO TABS: 1.0000 | ORAL_TABLET | Freq: Two times a day (BID) | ORAL | 0 refills | Status: DC

## 2019-01-25 MED ORDER — CEPHALEXIN 500 MG PO CAPS: 500.0000 mg | ORAL_CAPSULE | Freq: Three times a day (TID) | ORAL | 0 refills | Status: DC

## 2019-01-25 MED ORDER — CEPHALEXIN 500 MG PO CAPS: ORAL_CAPSULE | ORAL | 0 refills | Status: DC

## 2019-01-25 MED FILL — AMOX-CLAV 500-125 MG TABLET: 500-125 | 7 days supply | Qty: 14 | Fill #0

## 2019-01-25 MED FILL — ?CEPHALEXIN 500MG CAPSULE: 500 | 21 days supply | Qty: 35 | Fill #0

## 2019-01-25 NOTE — Progress Notes (Signed)
Virtual Visit via Telephone Note  I connected with Cindy Garrison on 01/25/19 at  9:50 AM EST by telephone and verified that I am speaking with the correct person using two identifiers.   I discussed the limitations, risks, security and privacy concerns of performing an evaluation and management service by telephone and the availability of in person appointments. I also discussed with the patient that there may be a patient responsible charge related to this service. The patient expressed understanding and agreed to proceed.  Patient Location: Home Provider Location: [facility] Office  Others participating in call:    History of Present Illness:        49 yo female who is seen in follow-up of abdominal pain- left lower abdominal pain that has been ongoing for months. She is s/p recent emergency department visit in follow-up of her lower abdominal pain and per her ED notes, her pain was thought to be due to uterine fibroids. She has complaint of chills and frequency of urination along with pain with urination. Upon questioning, she never picked up antibiotic that was prescribed in mid-December. Her urine culture in December showed streptococcal bacterial growth. She denies any recent increase in back pain and no nausea. She has an appointment at 2 pm today with gynecology in follow-up of her fibroids.  Abdominal pain is dull and aching and usually starts and then gets worse a few hours after she is at her job and working.   Past Medical History:  Diagnosis Date  . [id number]   . Acne   . Back pain   . Essential hypertension   . Seasonal allergies     Past Surgical History:  Procedure Laterality Date  . DILITATION & CURRETTAGE/HYSTROSCOPY WITH NOVASURE ABLATION N/A 12/22/2017   Procedure: DILATATION & CURETTAGE/ HYSTEROSCOPY WITH ENDOMETRIAL FIBROID AND  NOVASURE ABLATION;  Surgeon: Lavonia Drafts, MD;  Location: [facility];  Service: Gynecology;  Laterality: N/A;    History reviewed. No  pertinent family history.  Social History   Tobacco Use  . Smoking status: Never Smoker  . Smokeless tobacco: Never Used  Substance Use Topics  . Alcohol use: Not Currently    Comment: occ.  . Drug use: No     Allergies  Allergen Reactions  . Other Rash    Blood pressure pill (unknown name)       Observations/Objective: No vital signs or physical exam conducted as visit was done via telephone  Assessment and Plan: 1. Lower abdominal pain; 4.  Encounter for examination following treatment at hospital She is status post emergency department visit on 12/29/2018 due to complaint of lower abdominal and pelvic pain.  Her pain was thought to be due to uterine fibroids.  Urinalysis was negative however patient has had prior negative urinalysis with growth of bacteria on culture.  As she is having lower abdominal pain along with dysuria and has had abnormal urine culture without treatment, new prescription has been sent to her pharmacy for Augmentin 500 mg twice daily x7 days and she has been asked to make follow-up appointment or lab visit for repeat urine culture in approximately 2 weeks.  She is to keep appointment at 2 PM today with gynecology in follow-up of uterine fibroids and lower abdominal/pelvic pain. - amoxicillin-clavulanate (AUGMENTIN) 500-125 MG tablet; Take 1 tablet (500 mg total) by mouth 2 (two) times daily for 7 days. Take after eating  Dispense: 14 tablet; Refill: 0 - Urine Culture; Future  2. Acute cystitis without hematuria; 5.  Dysuria Patient with abnormal urine culture which was done on 12/28/2018 and patient reports that she did not pick up the prescribed antibiotic to take for urinary tract infection/cystitis based on urine culture results.  Prescription will be sent to patient's pharmacy for Augmentin 500 mg to take 1 twice daily after a meal.  She is also encouraged to return in approximately 2 weeks after completing antibiotic therapy to have a repeat urine culture.   She should also call or return sooner if she continues to have any continued symptoms with dysuria or abdominal pain. - amoxicillin-clavulanate (AUGMENTIN) 500-125 MG tablet; Take 1 tablet (500 mg total) by mouth 2 (two) times daily for 7 days. Take after eating  Dispense: 14 tablet; Refill: 0 - Urine Culture; Future  3. Uterine leiomyoma, unspecified location Patient with recurrent lower abdominal pain and she has had pelvic ultrasound 07/28/2018 showing a diffusely enlarged uterus with lobulated contour at the fundus of the uterus consistent with fibroids.  Patient has GYN appointment at today's visit in follow-up.  6.  Language barrier Patient initially did not wish to have [place] interpreter when she spoke with [place] however when I called patient to start her visit, she was unable to answer some of the questions regarding her current pain and urinary symptoms and she agreed to have interpreter therefore [place] interpretation systems was contacted to obtain French-speaking interpreter to help with communication barrier that might otherwise impede health care.  Follow Up Instructions:Return in about 2 weeks (around 02/08/2019) for Lower abdominal pain/UTI-2 weeks in office or lab visit for urine culture.    I discussed the assessment and treatment plan with the patient. The patient was provided an opportunity to ask questions and all were answered. The patient agreed with the plan and demonstrated an understanding of the instructions.   The patient was advised to call back or seek an in-person evaluation if the symptoms worsen or if the condition fails to improve as anticipated.  I provided 16 minutes of non-face-to-face time during this encounter.   Antony Blackbird, MD

## 2019-01-25 NOTE — Progress Notes (Signed)
Pt. Is still having lower abdominal pain.

## 2019-01-26 NOTE — Progress Notes (Signed)
Ms Borton presents with live [place] interrupter.  She reports lower abd pain for several months.  She describes the pain as a pressure feeling, comes and goes and some days at worst than others. OTC pain meds help a little. She denies any N/V bowel or bladder dysfunction.  She has a H/O DUB. She is s/p myosure resection of fibroid, followed by Novasure ablation. Completed 12/19 by Dr Rip Harbour.   She denies any vagina bleeding. Is not sexual active.  U/S 8/20 demonstrated uterine fibroids and endometrium with thickness and possible blood clot.   PE AF VSS Lungs clear Heart RRR Abd soft + BS diffuse lower abd tenderness, no rebound GU Nl EGBUS, no vaginal discharge, cervix without lesions, uterus 8-10 weeks, non tender, no adnexal tenderness, + bladder tenderness  A/P Chronic cystitis.        Abnormal endometrium        Uterine fibroids  I suspect pt's Sx are related to her bladder. Will treat accordingly with Pyridium and Keflex. Feel abnormal endometrium on U/S related to previous Myosure resection and Novasure. Endometrial sampling at time of surgery benign and pt has no vaginal bleeding. Will f/u in 4 weeks

## 2019-01-30 ENCOUNTER — Encounter (HOSPITAL_COMMUNITY): Payer: Self-pay | Admitting: Emergency Medicine

## 2019-01-30 ENCOUNTER — Other Ambulatory Visit: Payer: Self-pay

## 2019-01-30 ENCOUNTER — Emergency Department (HOSPITAL_COMMUNITY)
Admission: EM | Admit: 2019-01-30 | Discharge: 2019-01-30 | Disposition: A | Discharge status: Home / Self Care | Payer: Self-pay | Attending: Emergency Medicine | Admitting: Emergency Medicine

## 2019-01-30 DIAGNOSIS — I10 Essential (primary) hypertension: Secondary | ICD-10-CM | POA: Insufficient documentation

## 2019-01-30 DIAGNOSIS — D259 Leiomyoma of uterus, unspecified: Secondary | ICD-10-CM | POA: Insufficient documentation

## 2019-01-30 DIAGNOSIS — Z79899 Other long term (current) drug therapy: Secondary | ICD-10-CM | POA: Insufficient documentation

## 2019-01-30 DIAGNOSIS — E876 Hypokalemia: Secondary | ICD-10-CM | POA: Insufficient documentation

## 2019-01-30 DIAGNOSIS — R300 Dysuria: Secondary | ICD-10-CM | POA: Insufficient documentation

## 2019-01-30 DIAGNOSIS — R102 Pelvic and perineal pain: Secondary | ICD-10-CM | POA: Insufficient documentation

## 2019-01-30 LAB — URINALYSIS, ROUTINE W REFLEX MICROSCOPIC
Bacteria, UA: NONE SEEN
Bilirubin Urine: NEGATIVE
Glucose, UA: NEGATIVE mg/dL
Hgb urine dipstick: NEGATIVE
Ketones, ur: NEGATIVE mg/dL
Leukocytes,Ua: NEGATIVE
Nitrite: POSITIVE — AB
Protein, ur: 100 mg/dL — AB
Specific Gravity, Urine: 1.016 (ref 1.005–1.030)
pH: 7 (ref 5.0–8.0)

## 2019-01-30 LAB — COMPREHENSIVE METABOLIC PANEL
ALT: 35 U/L (ref 0–44)
AST: 18 U/L (ref 15–41)
Albumin: 3.9 g/dL (ref 3.5–5.0)
Alkaline Phosphatase: 75 U/L (ref 38–126)
Anion gap: 14 (ref 5–15)
BUN: 7 mg/dL (ref 6–20)
CO2: 26 mmol/L (ref 22–32)
Calcium: 9.1 mg/dL (ref 8.9–10.3)
Chloride: 95 mmol/L — ABNORMAL LOW (ref 98–111)
Creatinine, Ser: 0.71 mg/dL (ref 0.44–1.00)
GFR calc Af Amer: >60 mL/min (ref >60)
GFR calc non Af Amer: >60 mL/min (ref >60)
Glucose, Bld: 98 mg/dL (ref 70–99)
Potassium: 3 mmol/L — ABNORMAL LOW (ref 3.5–5.1)
Sodium: 135 mmol/L (ref 135–145)
Total Bilirubin: 0.6 mg/dL (ref 0.3–1.2)
Total Protein: 7.7 g/dL (ref 6.5–8.1)

## 2019-01-30 LAB — CBC
HCT: 34.5 % — ABNORMAL LOW (ref 36.0–46.0)
Hemoglobin: 11.8 g/dL — ABNORMAL LOW (ref 12.0–15.0)
MCH: 26 pg (ref 26.0–34.0)
MCHC: 34.2 g/dL (ref 30.0–36.0)
MCV: 76 fL — ABNORMAL LOW (ref 80.0–100.0)
Platelets: 334 10*3/uL (ref 150–400)
RBC: 4.54 MIL/uL (ref 3.87–5.11)
RDW: 14.2 % (ref 11.5–15.5)
WBC: 7.9 10*3/uL (ref 4.0–10.5)
nRBC: 0 % (ref 0.0–0.2)

## 2019-01-30 LAB — I-STAT BETA HCG BLOOD, ED (MC, WL, AP ONLY): I-stat hCG, quantitative: <5 m[IU]/mL (ref <5)

## 2019-01-30 LAB — LIPASE, BLOOD: Lipase: 19 U/L (ref 11–51)

## 2019-01-30 MED ORDER — POTASSIUM CHLORIDE CRYS ER 20 MEQ PO TBCR
40.0000 meq | EXTENDED_RELEASE_TABLET | Freq: Once | ORAL | Status: AC
  Administered 2019-01-30: 40 meq via ORAL
  Filled 2019-01-30: qty 2

## 2019-01-30 MED ORDER — SODIUM CHLORIDE 0.9% FLUSH: 3.0000 mL | Freq: Once | INTRAVENOUS | Status: DC

## 2019-01-30 MED ORDER — POTASSIUM CHLORIDE ER 10 MEQ PO TBCR: 10.0000 meq | EXTENDED_RELEASE_TABLET | Freq: Every day | ORAL | 0 refills | Status: DC

## 2019-01-30 MED ORDER — ACETAMINOPHEN 325 MG PO TABS
650.0000 mg | ORAL_TABLET | Freq: Once | ORAL | Status: AC
  Administered 2019-01-30: 650 mg via ORAL
  Filled 2019-01-30: qty 2

## 2019-01-30 MED FILL — POTASSIUM CHLORIDE ER 10 ME: 10 | 4 days supply | Qty: 4 | Fill #0

## 2019-01-30 NOTE — ED Triage Notes (Signed)
Patient reports hypogastric/pelvic  pain onset last Thursday , no emesis or diarrhea , denies fever or chills , currently taking oral antibiotic for cystitis .

## 2019-01-30 NOTE — ED Notes (Signed)
Using interpreter, pt verbalized understanding of discharge paperwork, prescriptions and follow-up care

## 2019-01-30 NOTE — Discharge Instructions (Signed)
You should continue taking the antibiotic, Keflex until you complete the course.  You should also take 650 mg of Tylenol every 6 hours to help with your pelvic pain.  You were also given a prescription for potassium.  Please take as directed and follow-up with your regular doctor next week for repeat labs to recheck your potassium.  Please also make an appointment to follow-up with your OB/GYN in regards to your pelvic pain.  Return to the emergency department for any new or worsening symptoms including any fevers, worsening abdominal pain, vomiting, or other concerns.

## 2019-01-30 NOTE — ED Provider Notes (Addendum)
[facility] EMERGENCY DEPARTMENT Provider Note   CSN: [id number] Arrival date & time: 01/30/19  0557     History Chief Complaint  Patient presents with  . Abdominal Pain   [place] interpreter was used throughout this evaluation  Cindy Garrison is a 49 y.o. female.  HPI   48 year old female with history of hypertension, seasonal allergies, uterine fibroids, presenting to the emergency department today with lower abdominal pain.  States this has been present intermittently for the last several months however over the last 6 days pain is seem to be worse.  Pain is constant in nature and she rates it 10/10.  She states it feels like the pain she used to have when she was menstruating however she is postmenopausal at this time.  She reports intermittent dysuria.  Denies frequency, urgency, hematuria.  No associated nausea, vomiting, diarrhea or constipation.  No associated fevers.  Records reviewed, patient has been seen in the ED several times for similar abdominal pain and she was actually also seen last week by her family medicine provider and her OB/GYN.  At that time she was diagnosed with cystitis and she was started on an antibiotic and Pyridium.  She states that on Thursday she did take acetaminophen for her symptoms which did improve her pain however she stopped taking it when she started the antibiotic and since then has had pain.  Past Medical History:  Diagnosis Date  . [id number]   . Acne   . Back pain   . Essential hypertension   . Seasonal allergies   . Syncope 09/16/2014  . Syncope and collapse 09/16/2014    Patient Active Problem List   Diagnosis Date Noted  . Chronic cystitis 01/25/2019  . Fibroid uterus 03/26/2015  . DUB (dysfunctional uterine bleeding) 03/26/2015  . Iron deficiency anemia due to chronic blood loss 11/22/2014  . Absolute anemia   . Anemia 09/16/2014  . Menorrhagia 09/16/2014  . Schizoaffective disorder ([place]) 09/16/2014  . Back  pain   . Essential hypertension   . Allergic rhinitis 09/13/2006    Past Surgical History:  Procedure Laterality Date  . DILITATION & CURRETTAGE/HYSTROSCOPY WITH NOVASURE ABLATION N/A 12/22/2017   Procedure: DILATATION & CURETTAGE/ HYSTEROSCOPY WITH ENDOMETRIAL FIBROID AND  NOVASURE ABLATION;  Surgeon: Lavonia Drafts, MD;  Location: [facility];  Service: Gynecology;  Laterality: N/A;     OB History    Gravida  3   Para  0   Term  0   Preterm  0   AB  3   Living  0     SAB  3   TAB  0   Ectopic  0   Multiple  0   Live Births              No family history on file.  Social History   Tobacco Use  . Smoking status: Never Smoker  . Smokeless tobacco: Never Used  Substance Use Topics  . Alcohol use: Not Currently    Comment: occ.  . Drug use: No    Home Medications Prior to Admission medications   Medication Sig Start Date End Date Taking? Authorizing Provider  amLODipine (NORVASC) 5 MG tablet Take 1 tablet (5 mg total) by mouth daily. 08/24/18   Argentina Donovan, PA-C  atorvastatin (LIPITOR) 20 MG tablet Take 1 tablet (20 mg total) by mouth daily. To lower cholesterol 08/24/18   Freeman Caldron M, PA-C  cephALEXin (KEFLEX) 500 MG capsule Take 1 tab  TID x 1 week. Then 1 capsule at night time until gone 01/25/19   Chancy Milroy, MD  diclofenac sodium (VOLTAREN) 1 % GEL Apply 2 g topically 4 (four) times daily. As needed for pain relief 03/15/18   Fulp, Cammie, MD  haloperidol (HALDOL) 1 MG tablet Take 2 mg by mouth at bedtime.    [provider]  hydrochlorothiazide (HYDRODIURIL) 25 MG tablet Take 1 tablet (25 mg total) by mouth daily. 08/24/18   Argentina Donovan, PA-C  lisinopril (ZESTRIL) 40 MG tablet TAKE 1 TABLET (40 MG TOTAL) BY MOUTH DAILY. TO LOWER BLOOD PRESSURE 12/19/18   Fulp, Cammie, MD  megestrol (MEGACE) 20 MG tablet Take 2 tablets (40 mg total) by mouth 2 (two) times daily. Patient not taking: Reported on 01/25/2019 03/09/18    Lavonia Drafts, MD  phenazopyridine (PYRIDIUM) 200 MG tablet Take 1 tablet (200 mg total) by mouth 3 (three) times daily as needed for pain (urethral spasm). 01/25/19   Chancy Milroy, MD  potassium chloride (KLOR-CON) 10 MEQ tablet Take 1 tablet (10 mEq total) by mouth daily for 4 days. 01/30/19 02/03/19  Angelea Penny S, PA-C  omeprazole (PRILOSEC) 40 MG capsule Take 1 capsule (40 mg total) by mouth 2 (two) times daily before a meal. 12/28/18 12/29/18  Fulp, Ander Gaster, MD    Allergies    Other  Review of Systems   Review of Systems  Constitutional: Negative for fever.  HENT: Negative for ear pain and sore throat.   Eyes: Negative for visual disturbance.  Respiratory: Negative for cough and shortness of breath.   Cardiovascular: Negative for chest pain.  Gastrointestinal: Positive for abdominal pain. Negative for constipation, diarrhea, nausea and vomiting.  Genitourinary: Positive for dysuria and pelvic pain. Negative for frequency, hematuria, urgency, vaginal bleeding and vaginal discharge.  Musculoskeletal: Negative for back pain.  Skin: Negative for color change and rash.  Neurological: Negative for headaches.  All other systems reviewed and are negative.   Physical Exam Updated Vital Signs BP 123/66 (BP Location: Left Arm)   Pulse 95   Temp 98.1 F (36.7 C) (Oral)   Resp 16   LMP 09/12/2017 (LMP Unknown)   SpO2 99%   Physical Exam Vitals and nursing note reviewed.  Constitutional:      General: She is not in acute distress.    Appearance: She is well-developed.  HENT:     Head: Normocephalic and atraumatic.  Eyes:     Conjunctiva/sclera: Conjunctivae normal.  Cardiovascular:     Rate and Rhythm: Normal rate and regular rhythm.     Heart sounds: Normal heart sounds. No murmur.  Pulmonary:     Effort: Pulmonary effort is normal. No respiratory distress.     Breath sounds: Normal breath sounds. No wheezing, rhonchi or rales.  Abdominal:     General: Bowel  sounds are normal.     Palpations: Abdomen is soft.     Tenderness: There is abdominal tenderness in the suprapubic area. There is no right CVA tenderness, left CVA tenderness, guarding or rebound.  Musculoskeletal:     Cervical back: Neck supple.  Skin:    General: Skin is warm and dry.  Neurological:     Mental Status: She is alert.     ED Results / Procedures / Treatments   Labs (all labs ordered are listed, but only abnormal results are displayed) Labs Reviewed  COMPREHENSIVE METABOLIC PANEL - Abnormal; Notable for the following components:      Result Value  Potassium 3.0 (*)    Chloride 95 (*)    All other components within normal limits  CBC - Abnormal; Notable for the following components:   Hemoglobin 11.8 (*)    HCT 34.5 (*)    MCV 76.0 (*)    All other components within normal limits  URINALYSIS, ROUTINE W REFLEX MICROSCOPIC - Abnormal; Notable for the following components:   Color, Urine AMBER (*)    Protein, ur 100 (*)    Nitrite POSITIVE (*)    All other components within normal limits  URINE CULTURE  LIPASE, BLOOD  I-STAT BETA HCG BLOOD, ED ([facility], [facility], [facility] ONLY)    EKG None  Radiology No results found.  Procedures Procedures (including critical care time)  Medications Ordered in ED Medications  sodium chloride flush (NS) 0.9 % injection 3 mL (3 mLs Intravenous Not Given 01/30/19 0954)  potassium chloride SA (KLOR-CON) CR tablet 40 mEq (40 mEq Oral Given 01/30/19 0953)  acetaminophen (TYLENOL) tablet 650 mg (650 mg Oral Given 01/30/19 [id number])    ED Course  I have reviewed the triage vital signs and the nursing notes.  Pertinent labs & imaging results that were available during my care of the patient were reviewed by me and considered in my medical decision making (see chart for details).    MDM Rules/Calculators/A&P                      49 year old female with history of uterine fibroids presenting for evaluation of lower abdominal pain.  Symptoms  chronic in nature but worsened over the last week.  Recently diagnosed with UTI and started on Pyridium and antibiotics.  Had been taking Tylenol for pain but stopped when she started antibiotics.  No other associated GI/GU symptoms.  On arrival patient initially tachycardic however this improved during a period of observation in the ED.  Blood pressures are borderline but on chart review this does appear consistent with her baseline.  Her abdominal exam reveals some tenderness suprapubically but there is no guarding, rebound, rigidity.  Reviewed labs ordered in triage CBC without leukocytosis, mild anemia present. CMP with mild hypokalemia at 3, supplemented in the ED.  Otherwise reassuring Lipase negative Pregnancy test negative UA with proteinuria and nitrites however no leukocytes, 0-5 RBCs, 0-5 WBCs and no bacteria seen.  - Suspect nitrites secondary to her taking Pyridium.  Will add urine culture as it appears that she did not complete urine culture when evaluated earlier this week.  Patient's work-up is reassuring.  I suspect that her continued pain is likely related to her uterine fibroids and I advised that she restart taking her Tylenol.  I did advise she continue her Keflex as well.  At this time she does not appear to have any emergent cause of her symptoms that would require further work-up or admission in the hospital at this time.  I reviewed her prior imaging which were consistent with uterine leiomyoma.  CT abdomen/pelvis from July also reviewed without any findings relevant to today's visit.  Have advised her to follow-up with her OB/GYN in 1 week for reevaluation.  Also advised follow-up with PCP in regards to her hypokalemia.  Advised on specific return precautions.  She voices understanding of the plan and reasons to return.  All questions answered.  Patient stable for discharge.  Final Clinical Impression(s) / ED Diagnoses Final diagnoses:  Pelvic pain in female  Uterine  leiomyoma, unspecified location  Hypokalemia    Rx /  DC Orders ED Discharge Orders         Ordered    potassium chloride (KLOR-CON) 10 MEQ tablet  Daily     01/30/19 0956           Rodney Booze, PA-C 01/30/19 [id number]    Rodney Booze, PA-C 01/30/19 [id number]    Veryl Speak, MD 01/30/19 1510

## 2019-01-31 LAB — URINE CULTURE: Culture: NO GROWTH

## 2019-02-05 ENCOUNTER — Encounter: Payer: Self-pay | Admitting: *Deleted

## 2019-02-06 MED FILL — MEGESTROL 20 MG TABLET: 20 | 15 days supply | Qty: 60 | Fill #3

## 2019-02-06 MED FILL — ?HYDROCHLOROTHIAZIDE 25MG T: 25 | 30 days supply | Qty: 30 | Fill #5

## 2019-02-06 MED FILL — AMLODIPINE BESYLATE 5 MG TA: 5 | 30 days supply | Qty: 30 | Fill #5

## 2019-02-15 ENCOUNTER — Encounter: Payer: Self-pay | Admitting: Obstetrics and Gynecology

## 2019-02-15 ENCOUNTER — Ambulatory Visit (INDEPENDENT_AMBULATORY_CARE_PROVIDER_SITE_OTHER): Payer: Self-pay | Admitting: Obstetrics and Gynecology

## 2019-02-15 ENCOUNTER — Other Ambulatory Visit: Payer: Self-pay

## 2019-02-15 VITALS — BP 153/88 | HR 94 | Temp 98.4°F | Wt 143.3 lb

## 2019-02-15 DIAGNOSIS — N939 Abnormal uterine and vaginal bleeding, unspecified: Secondary | ICD-10-CM

## 2019-02-15 DIAGNOSIS — D259 Leiomyoma of uterus, unspecified: Secondary | ICD-10-CM

## 2019-02-15 MED ORDER — MEGESTROL ACETATE 40 MG PO TABS: 40.0000 mg | ORAL_TABLET | Freq: Two times a day (BID) | ORAL | 5 refills | Status: DC

## 2019-02-15 MED ORDER — MEGESTROL ACETATE 20 MG PO TABS: 40.0000 mg | ORAL_TABLET | Freq: Two times a day (BID) | ORAL | 3 refills | Status: DC

## 2019-02-15 MED FILL — MEGESTROL 20 MG TABLET: 20 | 15 days supply | Qty: 60 | Fill #0

## 2019-02-15 NOTE — Progress Notes (Signed)
Interpreter present for encounter. Pt states her pain has improved since taking Megace. She requests refill. She gets the pain when she is working and using "force".

## 2019-02-15 NOTE — Progress Notes (Signed)
Patient ID: Cindy Garrison, female   DOB: 1970-05-21, 49 y.o.   MRN: [id number] [name] presents for follow up of her abd/pelvic pain. She has almost completed her antibiotics for chronic cystitis. She reports no more pain today. She has also restarted taking Megace bid. Still no bleeding.  PE AF VSS Lungs clear  Heart RRR Abd soft + BS non tender GU deferred  A/P Chronic cystitis, resolved        Uterine fibroids  Will continue with Megace. F/U in 3 months for virtual visit with Dr. Loletha Grayer- [facility].

## 2019-02-20 ENCOUNTER — Other Ambulatory Visit: Payer: Self-pay | Admitting: Obstetrics and Gynecology

## 2019-02-20 DIAGNOSIS — N3020 Other chronic cystitis without hematuria: Secondary | ICD-10-CM

## 2019-02-20 MED FILL — ?HALOPERIDOL 2 MG TABLET: 2 | 30 days supply | Qty: 30 | Fill #1

## 2019-02-20 MED FILL — LISINOPRIL 40 MG TABLET: 40 | 30 days supply | Qty: 30 | Fill #2

## 2019-02-21 MED FILL — CEPHALEXIN 500 MG CAPSULE: 500 | 14 days supply | Qty: 35 | Fill #0

## 2019-02-23 ENCOUNTER — Ambulatory Visit: Payer: Self-pay | Admitting: Obstetrics and Gynecology

## 2019-03-01 ENCOUNTER — Other Ambulatory Visit: Payer: Self-pay

## 2019-03-01 ENCOUNTER — Encounter: Payer: Self-pay | Admitting: Family Medicine

## 2019-03-01 ENCOUNTER — Ambulatory Visit: Payer: Self-pay | Attending: Family Medicine | Admitting: Family Medicine

## 2019-03-01 VITALS — BP 111/72 | HR 74 | Temp 98.0°F | Resp 18 | Ht 62.0 in | Wt 147.0 lb

## 2019-03-01 DIAGNOSIS — R1031 Right lower quadrant pain: Secondary | ICD-10-CM

## 2019-03-01 DIAGNOSIS — R82998 Other abnormal findings in urine: Secondary | ICD-10-CM

## 2019-03-01 DIAGNOSIS — Z758 Other problems related to medical facilities and other health care: Secondary | ICD-10-CM

## 2019-03-01 DIAGNOSIS — R1030 Lower abdominal pain, unspecified: Secondary | ICD-10-CM

## 2019-03-01 DIAGNOSIS — Z789 Other specified health status: Secondary | ICD-10-CM

## 2019-03-01 DIAGNOSIS — Z603 Acculturation difficulty: Secondary | ICD-10-CM

## 2019-03-01 LAB — POCT URINALYSIS DIP (CLINITEK)
Bilirubin, UA: NEGATIVE
Glucose, UA: NEGATIVE mg/dL
Ketones, POC UA: NEGATIVE mg/dL
Nitrite, UA: NEGATIVE
POC PROTEIN,UA: NEGATIVE
Spec Grav, UA: 1.02
Urobilinogen, UA: 0.2 U/dL
pH, UA: 6.5

## 2019-03-01 MED ORDER — IBUPROFEN 600 MG PO TABS: 600.0000 mg | ORAL_TABLET | Freq: Three times a day (TID) | ORAL | 0 refills | Status: DC | PRN

## 2019-03-01 MED FILL — ?ATORVASTATIN 20 MG TABLET: 20 | 30 days supply | Qty: 30 | Fill #4

## 2019-03-01 MED FILL — ?IBUPROFEN 600 MG TABLETS: 600 | 10 days supply | Qty: 30 | Fill #0

## 2019-03-01 NOTE — Progress Notes (Signed)
Established Patient Office Visit  Subjective:  Patient ID: Cindy Garrison, female    DOB: 1970/03/18  Age: 49 y.o. MRN: [id number]  CC:  Chief Complaint  Patient presents with  . Follow-up    HTN  . Groin Pain   Due to a language barrier, [employer] video interpretation system which is to today's visit  HPI Cindy Garrison, 49 year old female who has been seen several times recently for lower abdominal pain and was found to have urinary tract infection as well as uterine fibroids, who presents secondary to complaint of pain in the right groin area.  She reports that her job requires her to do heavy lifting and she believes that this is the cause of her current right groin pain.  She reports that she has to pick up a heavy box and then place it on a shelf behind her.  She does not recall any discrete episode of onset of right groin pain while at work or while lifting but she states that recently she had onset of pain in the right groin the day after being at work.  Pain is sharp.  Pain is always there but sometimes pain is worse than at other times.  Pain increases when she does have to do lifting.  Pain ranges from about a 4 on a 0-to-10 scale up to an 8.  She denies any current urinary symptoms such as frequency, urgency or dysuria.  No current fever or chills.  No abdominal pain other than that site of the groin pain.  She reports that her abdominal pain went away after she started taking an antibiotic, Keflex, prescribed at the emergency department.  She has the medication bottle with her.  She states that she initially took 2 pills a day and now only takes 1 pill a day of the antibiotic and she has had no further lower abdominal pain.  Past Medical History:  Diagnosis Date  . [id number]   . Acne   . Back pain   . Essential hypertension   . Seasonal allergies   . Syncope 09/16/2014  . Syncope and collapse 09/16/2014    Past Surgical History:  Procedure Laterality Date  . DILITATION  & CURRETTAGE/HYSTROSCOPY WITH NOVASURE ABLATION N/A 12/22/2017   Procedure: DILATATION & CURETTAGE/ HYSTEROSCOPY WITH ENDOMETRIAL FIBROID AND  NOVASURE ABLATION;  Surgeon: Lavonia Drafts, MD;  Location: [facility];  Service: Gynecology;  Laterality: N/A;    History reviewed. No pertinent family history.  Social History   Socioeconomic History  . Marital status: Married    Spouse name: Not on file  . Number of children: Not on file  . Years of education: Not on file  . Highest education level: Not on file  Occupational History  . Not on file  Tobacco Use  . Smoking status: Never Smoker  . Smokeless tobacco: Never Used  Substance and Sexual Activity  . Alcohol use: Not Currently    Comment: occ.  . Drug use: No  . Sexual activity: Not on file    Comment: monogamous female partner for 5 years (2010)  Other Topics Concern  . Not on file  Social History Narrative  . Not on file   Social Determinants of Health   Financial Resource Strain:   . Difficulty of Paying Living Expenses: Not on file  Food Insecurity:   . Worried About Charity fundraiser in the Last Year: Not on file  . Ran Out of Food in the Last Year: Not  on file  Transportation Needs:   . Lack of Transportation (Medical): Not on file  . Lack of Transportation (Non-Medical): Not on file  Physical Activity:   . Days of Exercise per Week: Not on file  . Minutes of Exercise per Session: Not on file  Stress:   . Feeling of Stress : Not on file  Social Connections:   . Frequency of Communication with Friends and Family: Not on file  . Frequency of Social Gatherings with Friends and Family: Not on file  . Attends Religious Services: Not on file  . Active Member of Clubs or Organizations: Not on file  . Attends Archivist Meetings: Not on file  . Marital Status: Not on file  Intimate Partner Violence:   . Fear of Current or Ex-Partner: Not on file  . Emotionally Abused: Not on file  . Physically  Abused: Not on file  . Sexually Abused: Not on file    Outpatient Medications Prior to Visit  Medication Sig Dispense Refill  . amLODipine (NORVASC) 5 MG tablet Take 1 tablet (5 mg total) by mouth daily. 30 tablet 6  . atorvastatin (LIPITOR) 20 MG tablet Take 1 tablet (20 mg total) by mouth daily. To lower cholesterol 30 tablet 6  . cephALEXin (KEFLEX) 500 MG capsule TAKE1 CAPSULE THREE TIMES A DAY FOR ONE WEEK THEN 1 CAPSULE AT NIGHT UNTIL GONE 35 capsule 0  . diclofenac sodium (VOLTAREN) 1 % GEL Apply 2 g topically 4 (four) times daily. As needed for pain relief 2 Tube 11  . haloperidol (HALDOL) 1 MG tablet Take 2 mg by mouth at bedtime.    . hydrochlorothiazide (HYDRODIURIL) 25 MG tablet Take 1 tablet (25 mg total) by mouth daily. 30 tablet 6  . lisinopril (ZESTRIL) 40 MG tablet TAKE 1 TABLET (40 MG TOTAL) BY MOUTH DAILY. TO LOWER BLOOD PRESSURE 30 tablet 2  . megestrol (MEGACE) 20 MG tablet Take 2 tablets (40 mg total) by mouth 2 (two) times daily. 60 tablet 3  . megestrol (MEGACE) 40 MG tablet Take 1 tablet (40 mg total) by mouth 2 (two) times daily. Can increase to two tablets twice a day in the event of heavy bleeding 60 tablet 5  . phenazopyridine (PYRIDIUM) 200 MG tablet Take 1 tablet (200 mg total) by mouth 3 (three) times daily as needed for pain (urethral spasm). (Patient not taking: Reported on 02/15/2019) 10 tablet 1  . potassium chloride (KLOR-CON) 10 MEQ tablet Take 1 tablet (10 mEq total) by mouth daily for 4 days. (Patient not taking: Reported on 03/01/2019) 4 tablet 0   No facility-administered medications prior to visit.    Allergies  Allergen Reactions  . Other Rash    Blood pressure pill (unknown name)    ROS Review of Systems  Constitutional: Positive for fatigue. Negative for chills and fever.  HENT: Negative for sore throat and trouble swallowing.   Respiratory: Negative for cough and shortness of breath.   Cardiovascular: Negative for chest pain, palpitations  and leg swelling.  Gastrointestinal: Negative for abdominal pain, blood in stool, constipation, diarrhea and nausea.  Endocrine: Negative for polydipsia, polyphagia and polyuria.  Genitourinary: Positive for pelvic pain (Right groin area). Negative for dysuria, flank pain and frequency.  Musculoskeletal: Positive for arthralgias (Right groin pain). Negative for back pain.  Neurological: Negative for dizziness and headaches.  Hematological: Negative for adenopathy. Does not bruise/bleed easily.      Objective:    Physical Exam  Constitutional: She is  oriented to person, place, and time. She appears well-developed and well-nourished.  Cardiovascular: Normal rate and regular rhythm.  Pulmonary/Chest: Effort normal and breath sounds normal.  Abdominal: Soft. There is no abdominal tenderness. There is no rebound and no guarding.  Musculoskeletal:        General: Tenderness present. No edema.     Comments: Patient with tenderness to palpation in the right groin/inguinal area.  She reports no discomfort with internal or external rotation of the right hip.  She has some relief/decrease in groin area pain when she has her right knee bent and her right foot flat on the table.  She reported some increase in inguinal area pain when she was asked to cough.  I was unable to palpate hernial defect on exam.  Neurological: She is alert and oriented to person, place, and time.  Skin: Skin is warm and dry.  Psychiatric: She has a normal mood and affect. Her behavior is normal.  Nursing note and vitals reviewed.   BP 111/72 (BP Location: Left Arm, Patient Position: Sitting, Cuff Size: Normal)   Pulse 74   Temp 98 F (36.7 C) (Oral)   Resp 18   Ht 5\' 2"  (1.575 m)   Wt 147 lb (66.7 kg)   LMP 09/12/2017 (LMP Unknown)   SpO2 100%   BMI 26.89 kg/m  Wt Readings from Last 3 Encounters:  03/01/19 147 lb (66.7 kg)  02/15/19 143 lb 4.8 oz (65 kg)  01/25/19 137 lb (62.1 kg)     Health Maintenance Due    Topic Date Due  . MAMMOGRAM  04/10/2018  . COLONOSCOPY  04/10/2018  . PAP SMEAR-Modifier  04/01/2019     Lab Results  Component Value Date   TSH 1.840 08/24/2018   Lab Results  Component Value Date   WBC 7.9 01/30/2019   HGB 11.8 (L) 01/30/2019   HCT 34.5 (L) 01/30/2019   MCV 76.0 (L) 01/30/2019   PLT 334 01/30/2019   Lab Results  Component Value Date   NA 135 01/30/2019   K 3.0 (L) 01/30/2019   CO2 26 01/30/2019   GLUCOSE 98 01/30/2019   BUN 7 01/30/2019   CREATININE 0.71 01/30/2019   BILITOT 0.6 01/30/2019   ALKPHOS 75 01/30/2019   AST 18 01/30/2019   ALT 35 01/30/2019   PROT 7.7 01/30/2019   ALBUMIN 3.9 01/30/2019   CALCIUM 9.1 01/30/2019   ANIONGAP 14 01/30/2019   Lab Results  Component Value Date   CHOL 247 (H) 11/14/2017   Lab Results  Component Value Date   HDL 56 11/14/2017   Lab Results  Component Value Date   LDLCALC 160 (H) 11/14/2017   Lab Results  Component Value Date   TRIG 155 (H) 11/14/2017   Lab Results  Component Value Date   CHOLHDL 4.4 11/14/2017   Lab Results  Component Value Date   HGBA1C 5.5 01/24/2018      Assessment & Plan:  1. Lower abdominal pain; 2.  Leukocytes in urine She has had recurrent issues of lower abdominal pain but reports that these have now resolved after taking Keflex however she is currently only taking 1 pill daily.  Patient was asked to give urine sample which did have presence of leukocytes and urine will be sent for culture.  We will make sure that patient has had complete resolution of her prior urinary tract infection. - POCT URINALYSIS DIP (CLINITEK) - Urine Culture  2. Right groin pain Patient will be scheduled for CT  of the pelvis and follow-up of right groin pain/inguinal area pain which may be related to hernia.  Prescription provided for ibuprofen 600 mg to take as needed for pain.  She was also asked to avoid lifting more than 10 pounds at a time.  She was offered to be written out of work  but states that she does not go back to work until Sunday and she wishes to try return to work this Sunday. - [place]; Future - ibuprofen (ADVIL) 600 MG tablet; Take 1 tablet (600 mg total) by mouth every 8 (eight) hours as needed for moderate pain. Take after eating  Dispense: 30 tablet; Refill: 0  4. Language barrier [employer] video interpretation system used at today's visit to help with language barrier but patient was still somewhat unable to relay the timeline of onset of her groin pain.   Meds ordered this encounter  Medications  . ibuprofen (ADVIL) 600 MG tablet    Sig: Take 1 tablet (600 mg total) by mouth every 8 (eight) hours as needed for moderate pain. Take after eating    Dispense:  30 tablet    Refill:  0    Follow-up: Return in about 2 weeks (around 03/15/2019) for inguinal pain but sooner if needed.  Go to emergency department if acute worsening of pain.   Antony Blackbird, MD

## 2019-03-01 NOTE — Progress Notes (Signed)
Patient requesting a letter from PCP regarding her limitations to provide to disability.

## 2019-03-03 LAB — URINE CULTURE: Organism ID, Bacteria: NO GROWTH

## 2019-03-12 MED FILL — ?HYDROCHLOROTHIAZIDE 25MG T: 25 | 30 days supply | Qty: 30 | Fill #6

## 2019-03-12 MED FILL — AMLODIPINE BESYLATE 5 MG TA: 5 | 30 days supply | Qty: 30 | Fill #6

## 2019-03-14 MED FILL — MEGESTROL 20 MG TABLET: 20 | 15 days supply | Qty: 60 | Fill #1

## 2019-03-16 ENCOUNTER — Encounter: Payer: Self-pay | Admitting: Family Medicine

## 2019-03-16 ENCOUNTER — Other Ambulatory Visit: Payer: Self-pay

## 2019-03-16 ENCOUNTER — Ambulatory Visit: Payer: Self-pay | Attending: Family Medicine | Admitting: Family Medicine

## 2019-03-16 VITALS — BP 135/69 | HR 90 | Temp 97.6°F | Resp 18 | Ht 61.0 in | Wt 148.0 lb

## 2019-03-16 DIAGNOSIS — R1031 Right lower quadrant pain: Secondary | ICD-10-CM

## 2019-03-16 NOTE — Progress Notes (Signed)
Subjective:  Patient ID: Cindy Garrison, female    DOB: [birth year]  Age: 49 y.o. MRN: [id number]  CC: Follow-up (inguinal pain)   HPI Cindy Garrison, 49 yo female seen in follow-up of right inguinal area pain. Pain has completely resolved and she has no other issues today. She did not have imaging done that was ordered at her last visit. She was not contacted regarding imaging.  Past Medical History:  Diagnosis Date  . [id number]   . Acne   . Back pain   . Essential hypertension   . Seasonal allergies   . Syncope 09/16/2014  . Syncope and collapse 09/16/2014    Past Surgical History:  Procedure Laterality Date  . DILITATION & CURRETTAGE/HYSTROSCOPY WITH NOVASURE ABLATION N/A 12/22/2017   Procedure: DILATATION & CURETTAGE/ HYSTEROSCOPY WITH ENDOMETRIAL FIBROID AND  NOVASURE ABLATION;  Surgeon: Lavonia Drafts, MD;  Location: [facility];  Service: Gynecology;  Laterality: N/A;    History reviewed. No pertinent family history.  Social History   Tobacco Use  . Smoking status: Never Smoker  . Smokeless tobacco: Never Used  Substance Use Topics  . Alcohol use: Not Currently    Comment: occ.    ROS Review of Systems  Objective:   Today's Vitals: BP 135/69 (BP Location: Left Arm, Patient Position: Sitting, Cuff Size: Normal)   Pulse 90   Temp 97.6 F (36.4 C) (Oral)   Resp 18   Ht 5\' 1"  (1.549 m)   Wt 148 lb (67.1 kg)   LMP 09/12/2017 (LMP Unknown)   SpO2 99%   BMI 27.96 kg/m   Physical Exam  General; WNWD female in NAD Abdomen-soft, nontender, no rebound or guarding; no palpable hernia Musculoskeletal: No pain with hip flexion and no pain with palpation of the hip area or with internal/external hip rotation   Assessment & Plan:  1. Right groin pain Symptoms have resolved. No current issues with pain. Patient has no other issues today. Will not charge patient for today's visit. Schedule 3 month follow-up of chronic issues.  Outpatient Encounter  Medications as of 03/16/2019  Medication Sig  . amLODipine (NORVASC) 5 MG tablet Take 1 tablet (5 mg total) by mouth daily.  Marland Kitchen atorvastatin (LIPITOR) 20 MG tablet Take 1 tablet (20 mg total) by mouth daily. To lower cholesterol  . cephALEXin (KEFLEX) 500 MG capsule TAKE1 CAPSULE THREE TIMES A DAY FOR ONE WEEK THEN 1 CAPSULE AT NIGHT UNTIL GONE  . diclofenac sodium (VOLTAREN) 1 % GEL Apply 2 g topically 4 (four) times daily. As needed for pain relief  . haloperidol (HALDOL) 1 MG tablet Take 2 mg by mouth at bedtime.  . hydrochlorothiazide (HYDRODIURIL) 25 MG tablet Take 1 tablet (25 mg total) by mouth daily.  Marland Kitchen ibuprofen (ADVIL) 600 MG tablet Take 1 tablet (600 mg total) by mouth every 8 (eight) hours as needed for moderate pain. Take after eating  . lisinopril (ZESTRIL) 40 MG tablet TAKE 1 TABLET (40 MG TOTAL) BY MOUTH DAILY. TO LOWER BLOOD PRESSURE  . megestrol (MEGACE) 20 MG tablet Take 2 tablets (40 mg total) by mouth 2 (two) times daily.  . megestrol (MEGACE) 40 MG tablet Take 1 tablet (40 mg total) by mouth 2 (two) times daily. Can increase to two tablets twice a day in the event of heavy bleeding  . phenazopyridine (PYRIDIUM) 200 MG tablet Take 1 tablet (200 mg total) by mouth 3 (three) times daily as needed for pain (urethral spasm). (Patient not taking: Reported on  02/15/2019)  . potassium chloride (KLOR-CON) 10 MEQ tablet Take 1 tablet (10 mEq total) by mouth daily for 4 days. (Patient not taking: Reported on 03/01/2019)  . [DISCONTINUED] omeprazole (PRILOSEC) 40 MG capsule Take 1 capsule (40 mg total) by mouth 2 (two) times daily before a meal.   No facility-administered encounter medications on file as of 03/16/2019.      Follow-up: Return in about 3 months (around 06/13/2019) for HTN and chronic issues.    Antony Blackbird MD

## 2019-03-22 ENCOUNTER — Other Ambulatory Visit: Payer: Self-pay | Admitting: Family Medicine

## 2019-03-22 DIAGNOSIS — I10 Essential (primary) hypertension: Secondary | ICD-10-CM

## 2019-03-23 MED FILL — LISINOPRIL 40 MG TABLET: 40 | 30 days supply | Qty: 30 | Fill #0

## 2019-03-30 MED FILL — ?HALOPERIDOL 2 MG TABLET: 2 | 30 days supply | Qty: 30 | Fill #0

## 2019-03-30 MED FILL — MEGESTROL 20 MG TABLET: 20 | 15 days supply | Qty: 60 | Fill #2

## 2019-04-16 ENCOUNTER — Other Ambulatory Visit: Payer: Self-pay | Admitting: Physician Assistant

## 2019-04-16 DIAGNOSIS — I10 Essential (primary) hypertension: Secondary | ICD-10-CM

## 2019-04-17 MED FILL — AMLODIPINE BESYLATE 5 MG TA: 5 | 30 days supply | Qty: 30 | Fill #0

## 2019-04-17 MED FILL — ?HYDROCHLOROTHIAZIDE 25MG T: 25 | 30 days supply | Qty: 30 | Fill #0

## 2019-04-24 MED FILL — LISINOPRIL 40 MG TABLET: 40 | 30 days supply | Qty: 30 | Fill #1

## 2019-04-24 MED FILL — ?ATORVASTATIN 20 MG TABLET: 20 | 30 days supply | Qty: 30 | Fill #5

## 2019-04-24 MED FILL — MEGESTROL 20 MG TABLET: 20 | 15 days supply | Qty: 60 | Fill #3

## 2019-05-16 ENCOUNTER — Other Ambulatory Visit: Payer: Self-pay | Admitting: Obstetrics and Gynecology

## 2019-05-16 DIAGNOSIS — N939 Abnormal uterine and vaginal bleeding, unspecified: Secondary | ICD-10-CM

## 2019-05-16 MED FILL — MEGESTROL 20 MG TABLET: 20 | 15 days supply | Qty: 60 | Fill #0

## 2019-05-16 MED FILL — HYDROCHLOROTHIAZIDE 25 MG T: 25 | 30 days supply | Qty: 30 | Fill #1

## 2019-05-16 MED FILL — ?HALOPERIDOL 2 MG TABLET: 2 | 30 days supply | Qty: 30 | Fill #1

## 2019-05-16 MED FILL — ?AMLODIPINE BESYLATE 5MG TA: 5 | 30 days supply | Qty: 30 | Fill #1

## 2019-05-25 MED FILL — ?ATORVASTATIN 20 MG TABLET: 20 | 30 days supply | Qty: 30 | Fill #6

## 2019-05-25 MED FILL — LISINOPRIL 40 MG TABLET: 40 | 30 days supply | Qty: 30 | Fill #2

## 2019-06-13 ENCOUNTER — Telehealth: Payer: Self-pay | Admitting: Family Medicine

## 2019-06-15 ENCOUNTER — Other Ambulatory Visit: Payer: Self-pay

## 2019-06-15 ENCOUNTER — Ambulatory Visit (INDEPENDENT_AMBULATORY_CARE_PROVIDER_SITE_OTHER): Payer: Self-pay | Admitting: Obstetrics and Gynecology

## 2019-06-15 ENCOUNTER — Encounter: Payer: Self-pay | Admitting: Obstetrics and Gynecology

## 2019-06-15 VITALS — BP 127/78 | HR 82 | Wt 147.9 lb

## 2019-06-15 DIAGNOSIS — D259 Leiomyoma of uterus, unspecified: Secondary | ICD-10-CM

## 2019-06-15 NOTE — Progress Notes (Signed)
Patient ID: Cindy Garrison, female   DOB: 08/28/1970, 49 y.o.   MRN: [id number] Pt presents in follow up for her uterine fibroids and bleeding. Still no pain. Taking Megace sometime qd and other times bid. No bleeding either way.  PE AF VSS Lungs clear Heart RRR Abd soft + Bs  A/P Uterine fibroids        DUB  Will stop Megace and following bleeding pattern. Pt instructed to take Megace 20 mg bid of bleeding returns. She will otherwise follow up in 4 months.

## 2019-06-15 NOTE — Progress Notes (Signed)
[place]

## 2019-06-20 ENCOUNTER — Other Ambulatory Visit: Payer: Self-pay | Admitting: Physician Assistant

## 2019-06-20 ENCOUNTER — Other Ambulatory Visit: Payer: Self-pay

## 2019-06-20 ENCOUNTER — Ambulatory Visit: Payer: Self-pay | Attending: Family Medicine | Admitting: Physician Assistant

## 2019-06-20 VITALS — BP 117/78 | HR 78 | Ht 61.0 in | Wt 151.8 lb

## 2019-06-20 DIAGNOSIS — R253 Fasciculation: Secondary | ICD-10-CM

## 2019-06-20 DIAGNOSIS — I10 Essential (primary) hypertension: Secondary | ICD-10-CM

## 2019-06-20 DIAGNOSIS — Z1239 Encounter for other screening for malignant neoplasm of breast: Secondary | ICD-10-CM

## 2019-06-20 DIAGNOSIS — Z789 Other specified health status: Secondary | ICD-10-CM

## 2019-06-20 DIAGNOSIS — E7800 Pure hypercholesterolemia, unspecified: Secondary | ICD-10-CM

## 2019-06-20 MED ORDER — HYDROCHLOROTHIAZIDE 25 MG PO TABS: 25.0000 mg | ORAL_TABLET | Freq: Every day | ORAL | 2 refills | Status: DC

## 2019-06-20 MED ORDER — ATORVASTATIN CALCIUM 20 MG PO TABS: 20.0000 mg | ORAL_TABLET | Freq: Every day | ORAL | 6 refills | Status: DC

## 2019-06-20 MED ORDER — LISINOPRIL 40 MG PO TABS: 40.0000 mg | ORAL_TABLET | Freq: Every day | ORAL | 2 refills | Status: DC

## 2019-06-20 MED ORDER — AMLODIPINE BESYLATE 5 MG PO TABS: 5.0000 mg | ORAL_TABLET | Freq: Every day | ORAL | 2 refills | Status: DC

## 2019-06-20 MED FILL — ?AMLODIPINE BESYLATE 5MG TA: 5 | 30 days supply | Qty: 30 | Fill #2

## 2019-06-20 MED FILL — ?ATORVASTATIN 20 MG TABLET: 20 | 30 days supply | Qty: 30 | Fill #0

## 2019-06-20 MED FILL — LISINOPRIL 40 MG TABLET: 40 | 30 days supply | Qty: 30 | Fill #0

## 2019-06-20 MED FILL — HYDROCHLOROTHIAZIDE 25 MG T: 25 | 30 days supply | Qty: 30 | Fill #2

## 2019-06-20 MED FILL — ?HALOPERIDOL 2 MG TABLET: 2 | 30 days supply | Qty: 30 | Fill #2

## 2019-06-20 NOTE — Progress Notes (Signed)
Cindy Garrison, is a 49 y.o. female  [id number]  [id number]  DOB - 11-Feb-1971  Subjective:  Chief Complaint and HPI: Cindy Garrison is a 49 y.o. female here today for on and off breast pain and has never had a mmg.  She denies any lumps/massess or current tenderness.  Also says that the L side of her face twitches after wearing a mask all day.  This has been going on for about 6 months.  She has had this same thing about 3 years ago that lasted for several months that was not associated with wearing a mask.  No facial pain or asymmetry.  No mouth pain.  No difficulty swallowing.  No extremity issues.    Madeline with [employer] interpreting.    ROS:   Constitutional:  No f/c, No night sweats, No unexplained weight loss. EENT:  No vision changes, No blurry vision, No hearing changes. No other mouth, throat, or ear problems.  Respiratory: No cough, No SOB Cardiac: No CP, no palpitations GI:  No abd pain, No N/V/D. GU: No Urinary s/sx Musculoskeletal: No joint pain Neuro: No headache, no dizziness, no motor weakness.  Skin: No rash Endocrine:  No polydipsia. No polyuria.  Psych: Denies SI/HI  No problems updated.  ALLERGIES: Allergies  Allergen Reactions  . Other Rash    Blood pressure pill (unknown name)    PAST MEDICAL HISTORY: Past Medical History:  Diagnosis Date  . [id number]   . Acne   . Back pain   . Essential hypertension   . Seasonal allergies   . Syncope 09/16/2014  . Syncope and collapse 09/16/2014    MEDICATIONS AT HOME: Prior to Admission medications   Medication Sig Start Date End Date Taking? Authorizing Provider  amLODipine (NORVASC) 5 MG tablet Take 1 tablet (5 mg total) by mouth daily. 06/20/19  Yes Freeman Caldron M, PA-C  atorvastatin (LIPITOR) 20 MG tablet Take 1 tablet (20 mg total) by mouth daily. To lower cholesterol 06/20/19  Yes Argentina Donovan, PA-C  diclofenac sodium (VOLTAREN) 1 % GEL Apply 2 g topically 4 (four) times daily. As needed  for pain relief 03/15/18  Yes Fulp, Cammie, MD  haloperidol (HALDOL) 1 MG tablet Take 2 mg by mouth at bedtime.   Yes [provider]  hydrochlorothiazide (HYDRODIURIL) 25 MG tablet Take 1 tablet (25 mg total) by mouth daily. 06/20/19  Yes Argentina Donovan, PA-C  ibuprofen (ADVIL) 600 MG tablet Take 1 tablet (600 mg total) by mouth every 8 (eight) hours as needed for moderate pain. Take after eating 03/01/19  Yes Fulp, Cammie, MD  lisinopril (ZESTRIL) 40 MG tablet Take 1 tablet (40 mg total) by mouth daily. To lower blood pressure 06/20/19  Yes Riki Berninger M, PA-C  megestrol (MEGACE) 20 MG tablet TAKE 2 TABLETS (40 MG TOTAL) BY MOUTH 2 (TWO) TIMES DAILY. Patient not taking: Reported on 06/20/2019 05/16/19   Chancy Milroy, MD  megestrol (MEGACE) 40 MG tablet Take 1 tablet (40 mg total) by mouth 2 (two) times daily. Can increase to two tablets twice a day in the event of heavy bleeding Patient not taking: Reported on 06/20/2019 02/15/19   Chancy Milroy, MD  omeprazole (PRILOSEC) 40 MG capsule Take 1 capsule (40 mg total) by mouth 2 (two) times daily before a meal. 12/28/18 12/29/18  Fulp, Cammie, MD     Objective:  EXAM:   Vitals:   06/20/19 1037  BP: 117/78  Pulse: 78  SpO2: 100%  Weight:  151 lb 12.8 oz (68.9 kg)  Height: 5\' 1"  (1.549 m)    General appearance : A&OX3. NAD. Non-toxic-appearing HEENT: Atraumatic and Normocephalic.  PERRLA. EOM intact.  TM clear B. Mouth-MMM, post pharynx WNL w/o erythema, No PND. Neck: supple, no JVD. No cervical lymphadenopathy. No thyromegaly Chest/Lungs:  Breathing-non-labored, Good air entry bilaterally, breath sounds normal without rales, rhonchi, or wheezing  CVS: S1 S2 regular, no murmurs, gallops, rubs  Extremities: Bilateral Lower Ext shows no edema, both legs are warm to touch with = pulse throughout Neurology:  CN II-XII grossly intact, Non focal.  Facial muscles and movements are symmetrical Psych:  TP hard to follow. J/I fair.  Normal speech. Appropriate eye contact and affect.  Skin:  No Rash  Data Review Lab Results  Component Value Date   HGBA1C 5.5 01/24/2018   HGBA1C 4.8 09/12/2017     Assessment & Plan   1. Encounter for screening for malignant neoplasm of breast, unspecified screening modality Has never had mmg - MM Digital Screening; Future  2. Muscle twitching Unsure etiology but no abnormalities on exam - TSH - Vitamin D, 25-hydroxy - CBC with Differential/Platelet  3. Essential hypertension Controlled-continue current regimen - TSH - Comprehensive metabolic panel - CBC with Differential/Platelet - amLODipine (NORVASC) 5 MG tablet; Take 1 tablet (5 mg total) by mouth daily.  Dispense: 30 tablet; Refill: 2 - hydrochlorothiazide (HYDRODIURIL) 25 MG tablet; Take 1 tablet (25 mg total) by mouth daily.  Dispense: 30 tablet; Refill: 2 - lisinopril (ZESTRIL) 40 MG tablet; Take 1 tablet (40 mg total) by mouth daily. To lower blood pressure  Dispense: 30 tablet; Refill: 2  4. Pure hypercholesterolemia - TSH - Comprehensive metabolic panel - atorvastatin (LIPITOR) 20 MG tablet; Take 1 tablet (20 mg total) by mouth daily. To lower cholesterol  Dispense: 30 tablet; Refill: 6  5. Language barrier [employer] interpreters used and additional time performing visit was required.   Patient have been counseled extensively about nutrition and exercise  Return in about 3 months (around 09/20/2019) for PCP;  chronic conditions.  The patient was given clear instructions to go to ER or return to medical center if symptoms don't improve, worsen or new problems develop. The patient verbalized understanding. The patient was told to call to get lab results if they haven't heard anything in the next week.     Freeman Caldron, PA-C [facility] [place], [place]   06/20/2019, 11:25 AMPatient ID: Cindy Garrison, female   DOB: [birth year], 49 y.o.   MRN: [id number]

## 2019-06-20 NOTE — Progress Notes (Signed)
States that her mouth is twisted and she is having pain in breast.

## 2019-06-21 ENCOUNTER — Other Ambulatory Visit: Payer: Self-pay | Admitting: Physician Assistant

## 2019-06-21 LAB — COMPREHENSIVE METABOLIC PANEL
ALT: 21 IU/L (ref 0–32)
AST: 19 IU/L (ref 0–40)
Albumin/Globulin Ratio: 1.7 (ref 1.2–2.2)
Albumin: 4.5 g/dL (ref 3.8–4.9)
Alkaline Phosphatase: 51 IU/L (ref 48–121)
BUN/Creatinine Ratio: 13 (ref 9–23)
BUN: 10 mg/dL (ref 6–24)
Bilirubin Total: 0.3 mg/dL (ref 0.0–1.2)
CO2: 20 mmol/L (ref 20–29)
Calcium: 9.7 mg/dL (ref 8.7–10.2)
Chloride: 100 mmol/L (ref 96–106)
Creatinine, Ser: 0.75 mg/dL (ref 0.57–1.00)
GFR calc Af Amer: 107 mL/min/{1.73_m2} (ref >59)
GFR calc non Af Amer: 93 mL/min/{1.73_m2} (ref >59)
Globulin, Total: 2.6 g/dL (ref 1.5–4.5)
Glucose: 76 mg/dL (ref 65–99)
Potassium: 4.3 mmol/L (ref 3.5–5.2)
Sodium: 136 mmol/L (ref 134–144)
Total Protein: 7.1 g/dL (ref 6.0–8.5)

## 2019-06-21 LAB — CBC WITH DIFFERENTIAL/PLATELET
Basophils Absolute: 0 10*3/uL (ref 0.0–0.2)
Basos: 0 %
EOS (ABSOLUTE): 0.2 10*3/uL (ref 0.0–0.4)
Eos: 3 %
Hematocrit: 36.9 % (ref 34.0–46.6)
Hemoglobin: 12.7 g/dL (ref 11.1–15.9)
Immature Grans (Abs): 0 10*3/uL (ref 0.0–0.1)
Immature Granulocytes: 0 %
Lymphocytes Absolute: 2.7 10*3/uL (ref 0.7–3.1)
Lymphs: 42 %
MCH: 27.3 pg (ref 26.6–33.0)
MCHC: 34.4 g/dL (ref 31.5–35.7)
MCV: 79 fL (ref 79–97)
Monocytes Absolute: 0.6 10*3/uL (ref 0.1–0.9)
Monocytes: 9 %
Neutrophils Absolute: 2.9 10*3/uL (ref 1.4–7.0)
Neutrophils: 46 %
Platelets: 225 10*3/uL (ref 150–450)
RBC: 4.66 x10E6/uL (ref 3.77–5.28)
RDW: 15.1 % (ref 11.7–15.4)
WBC: 6.5 10*3/uL (ref 3.4–10.8)

## 2019-06-21 LAB — TSH: TSH: 1.26 u[IU]/mL (ref 0.450–4.500)

## 2019-06-21 LAB — VITAMIN D 25 HYDROXY (VIT D DEFICIENCY, FRACTURES): Vit D, 25-Hydroxy: 22.6 ng/mL — ABNORMAL LOW (ref 30.0–100.0)

## 2019-06-21 MED ORDER — VITAMIN D (ERGOCALCIFEROL) 1.25 MG (50000 UNIT) PO CAPS
50000.0000 [IU] | ORAL_CAPSULE | ORAL | 0 refills | Status: DC
Start: 2019-06-21 — End: 2020-01-23

## 2019-06-21 MED FILL — ?ERGOCALCIFEROL 50000 UNITC: 1.25 MG | 28 days supply | Qty: 4 | Fill #0

## 2019-06-29 ENCOUNTER — Encounter: Payer: Self-pay | Admitting: *Deleted

## 2019-07-19 MED FILL — ?ATORVASTATIN 20 MG TABLET: 20 | 30 days supply | Qty: 30 | Fill #1

## 2019-07-19 MED FILL — MEGESTROL 20 MG TABLET: 20 | 15 days supply | Qty: 60 | Fill #2

## 2019-07-19 MED FILL — HYDROCHLOROTHIAZIDE 25 MG T: 25 | 30 days supply | Qty: 30 | Fill #0

## 2019-07-24 MED FILL — ?HALOPERIDOL 2 MG TABLET: 2 | 30 days supply | Qty: 30 | Fill #0

## 2019-07-24 MED FILL — ?ERGOCALCIFEROL 50000 UNITC: 1.25 MG | 28 days supply | Qty: 4 | Fill #1

## 2019-07-24 MED FILL — ?AMLODIPINE BESYLATE 5MG TA: 5 | 30 days supply | Qty: 30 | Fill #0

## 2019-07-24 MED FILL — LISINOPRIL 40 MG TABLET: 40 | 30 days supply | Qty: 30 | Fill #1

## 2019-08-30 MED FILL — ?AMLODIPINE BESYLATE 5MG TA: 5 | 30 days supply | Qty: 30 | Fill #1

## 2019-08-30 MED FILL — ?HALOPERIDOL 2 MG TABLET: 2 | 30 days supply | Qty: 30 | Fill #1

## 2019-08-30 MED FILL — MEGESTROL 20 MG TABLET: 20 | 15 days supply | Qty: 60 | Fill #3

## 2019-08-30 MED FILL — ?ERGOCALCIFEROL 50000 UNITC: 1.25 MG | 28 days supply | Qty: 4 | Fill #2

## 2019-08-30 MED FILL — LISINOPRIL 40 MG TABLET: 40 | 30 days supply | Qty: 30 | Fill #2

## 2019-08-30 MED FILL — HYDROCHLOROTHIAZIDE 25 MG T: 25 | 30 days supply | Qty: 30 | Fill #1

## 2019-09-13 ENCOUNTER — Ambulatory Visit (HOSPITAL_COMMUNITY): Payer: Self-pay | Admitting: Psychiatry

## 2019-09-17 ENCOUNTER — Other Ambulatory Visit (HOSPITAL_COMMUNITY): Payer: Self-pay | Admitting: Psychiatry

## 2019-09-17 ENCOUNTER — Ambulatory Visit (INDEPENDENT_AMBULATORY_CARE_PROVIDER_SITE_OTHER): Payer: No Payment, Other | Admitting: Psychiatry

## 2019-09-17 ENCOUNTER — Other Ambulatory Visit: Payer: Self-pay

## 2019-09-17 ENCOUNTER — Encounter (HOSPITAL_COMMUNITY): Payer: Self-pay | Admitting: Psychiatry

## 2019-09-17 DIAGNOSIS — F259 Schizoaffective disorder, unspecified: Secondary | ICD-10-CM | POA: Diagnosis not present

## 2019-09-17 MED ORDER — HALOPERIDOL 2 MG PO TABS: 2.0000 mg | ORAL_TABLET | Freq: Every day | ORAL | 2 refills | Status: DC

## 2019-09-17 NOTE — Progress Notes (Signed)
Psychiatric Initial Adult Assessment     Patient Identification: Cindy Garrison MRN:  [id number] Date of Evaluation:  09/17/2019   Patient was seen with the help of [place] interpreter via EMS services.  Referral Source: [facility]  Chief Complaint:   " I am doing fine."   Visit Diagnosis:    ICD-10-CM   1. Schizoaffective disorder, unspecified type ([place])  F25.9 haloperidol (HALDOL) 2 MG tablet    History of Present Illness: This is a 49 year old female with history of schizoaffective disorder now seen for evaluation and establishing care.  Patient showed her bottle of Haldol 2 mg tablets to the Probation officer.  She stated that this medicine helps her immensely.  She stated that her sleep is good and she also eats well. She stated that she has been on this medicine for around 10 years.  Writer asked her what were the reasons for her to be started on this medicine.  She informed that she would sometimes hear voices and seeing things.  She denied any paranoid delusions in the past. She denied any current hallucinations or current delusions. She reported her mood is stable and denied any anxiety.  She denied any symptom suggestive of depression, mania, hypomania.  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  She lives with her husband. She stated that she does not have any children. She is originally from [place] and has been in this country for more than a decade.  Other than requesting refills, she denied any other complaints or issues.  She denied any twitching of her face as she had reported that to a provider back in June.  She denied any symptoms suggestive of EPS or akathisia.   Past Psychiatric History: Schizoaffective disorder, Has been on Haldol for 10 years.  Was being seen at [facility] in the past.  Previous Psychotropic Medications: Yes   Substance Abuse History in the last 12 months:  No.  Consequences of Substance Abuse: NA  Past Medical  History:  Past Medical History:  Diagnosis Date  . #[id number]   . Acne   . Back pain   . Essential hypertension   . Seasonal allergies   . Syncope 09/16/2014  . Syncope and collapse 09/16/2014    Past Surgical History:  Procedure Laterality Date  . DILITATION & CURRETTAGE/HYSTROSCOPY WITH NOVASURE ABLATION N/A 12/22/2017   Procedure: DILATATION & CURETTAGE/ HYSTEROSCOPY WITH ENDOMETRIAL FIBROID AND  NOVASURE ABLATION;  Surgeon: Lavonia Drafts, MD;  Location: [facility];  Service: Gynecology;  Laterality: N/A;    Family Psychiatric History: denied  Family History: No family history on file.  Social History:   Social History   Socioeconomic History  . Marital status: Married    Spouse name: Not on file  . Number of children: Not on file  . Years of education: Not on file  . Highest education level: Not on file  Occupational History  . Not on file  Tobacco Use  . Smoking status: Never Smoker  . Smokeless tobacco: Never Used  Vaping Use  . Vaping Use: Never used  Substance and Sexual Activity  . Alcohol use: Not Currently    Comment: occ.  . Drug use: No  . Sexual activity: Not on file    Comment: monogamous female partner for 5 years (2010)  Other Topics Concern  . Not on file  Social History Narrative  . Not on file   Social Determinants of Health   Financial Resource Strain:   .  Difficulty of Paying Living Expenses: Not on file  Food Insecurity:   . Worried About Charity fundraiser in the Last Year: Not on file  . Ran Out of Food in the Last Year: Not on file  Transportation Needs:   . Lack of Transportation (Medical): Not on file  . Lack of Transportation (Non-Medical): Not on file  Physical Activity:   . Days of Exercise per Week: Not on file  . Minutes of Exercise per Session: Not on file  Stress:   . Feeling of Stress : Not on file  Social Connections:   . Frequency of Communication with Friends and Family: Not on file  . Frequency of Social  Gatherings with Friends and Family: Not on file  . Attends Religious Services: Not on file  . Active Member of Clubs or Organizations: Not on file  . Attends Archivist Meetings: Not on file  . Marital Status: Not on file    Additional Social History: Lives with spouse, works at a Engineer, water. Has no children.  Allergies:   Allergies  Allergen Reactions  . Other Rash    Blood pressure pill (unknown name)    Metabolic Disorder Labs: Lab Results  Component Value Date   HGBA1C 5.5 01/24/2018   No results found for: PROLACTIN Lab Results  Component Value Date   CHOL 247 (H) 11/14/2017   TRIG 155 (H) 11/14/2017   HDL 56 11/14/2017   CHOLHDL 4.4 11/14/2017   LDLCALC 160 (H) 11/14/2017   LDLCALC 151 (H) 12/17/2016   Lab Results  Component Value Date   TSH 1.260 06/20/2019    Therapeutic Level Labs: No results found for: LITHIUM No results found for: CBMZ No results found for: VALPROATE  Current Medications: Current Outpatient Medications  Medication Sig Dispense Refill  . amLODipine (NORVASC) 5 MG tablet Take 1 tablet (5 mg total) by mouth daily. 30 tablet 2  . atorvastatin (LIPITOR) 20 MG tablet Take 1 tablet (20 mg total) by mouth daily. To lower cholesterol 30 tablet 6  . diclofenac sodium (VOLTAREN) 1 % GEL Apply 2 g topically 4 (four) times daily. As needed for pain relief 2 Tube 11  . haloperidol (HALDOL) 1 MG tablet Take 2 mg by mouth at bedtime.    . haloperidol (HALDOL) 2 MG tablet Take 1 tablet (2 mg total) by mouth at bedtime. 30 tablet 2  . hydrochlorothiazide (HYDRODIURIL) 25 MG tablet Take 1 tablet (25 mg total) by mouth daily. 30 tablet 2  . ibuprofen (ADVIL) 600 MG tablet Take 1 tablet (600 mg total) by mouth every 8 (eight) hours as needed for moderate pain. Take after eating 30 tablet 0  . lisinopril (ZESTRIL) 40 MG tablet Take 1 tablet (40 mg total) by mouth daily. To lower blood pressure 30 tablet 2  . megestrol (MEGACE) 20 MG tablet  TAKE 2 TABLETS (40 MG TOTAL) BY MOUTH 2 (TWO) TIMES DAILY. (Patient not taking: Reported on 06/20/2019) 60 tablet 3  . megestrol (MEGACE) 40 MG tablet Take 1 tablet (40 mg total) by mouth 2 (two) times daily. Can increase to two tablets twice a day in the event of heavy bleeding (Patient not taking: Reported on 06/20/2019) 60 tablet 5  . Vitamin D, Ergocalciferol, (DRISDOL) 1.25 MG (50000 UNIT) CAPS capsule Take 1 capsule (50,000 Units total) by mouth every 7 (seven) days. 16 capsule 0   No current facility-administered medications for this visit.    Musculoskeletal: Strength & Muscle Tone: within  normal limits Gait & Station: normal Patient leans: N/A  Psychiatric Specialty Exam: Review of Systems  Last menstrual period 09/12/2017.There is no height or weight on file to calculate BMI.  General Appearance: Fairly Groomed  Eye Contact:  Good  Speech:  Clear and Coherent and Normal Rate  Volume:  Normal  Mood:  Euthymic  Affect:  Constricted  Thought Process:  Goal Directed and Descriptions of Associations: Intact  Orientation:  Full (Time, Place, and Person)  Thought Content:  Logical  Suicidal Thoughts:  No  Homicidal Thoughts:  No  Memory:  Immediate;   Good Recent;   Good  Judgement:  Fair  Insight:  Fair  Psychomotor Activity:  Normal  Concentration:  Concentration: Good and Attention Span: Good  Recall:  Good  Fund of Knowledge:Good  Language: Good  Akathisia:  Negative  Handed:  Right  AIMS (if indicated):  0  Assets:  Communication Skills Desire for Improvement Financial Resources/Insurance Housing  ADL's:  Intact  Cognition: WNL  Sleep:  Good   Screenings: GAD-7     Office Visit from 03/16/2019 in [facility] Office Visit from 03/01/2019 in [facility] Office Visit from 03/23/2017 in [facility] Office Visit from 02/23/2016 in [facility]  Total GAD-7  Score 0 0 2 0    PHQ2-9     Office Visit from 03/16/2019 in [facility] Office Visit from 03/01/2019 in [place] Office Visit from 02/04/2018 in [facility] [place] from 01/31/2018 in [facility] Office Visit from 03/23/2017 in [facility]  PHQ-2 Total Score 0 0 0 0 1  PHQ-9 Total Score -- -- -- 2 6      Assessment and Plan: Patient appears to be stable on current regimen.  1. Schizoaffective disorder, unspecified type ([place])  - haloperidol (HALDOL) 2 MG tablet; Take 1 tablet (2 mg total) by mouth at bedtime.  Dispense: 30 tablet; Refill: 2   Continue same medication regimen. Follow up in 3 months.   [place] Crane, MD 8/30/20219:44 AM

## 2019-09-20 ENCOUNTER — Encounter: Payer: Self-pay | Admitting: Family Medicine

## 2019-09-20 ENCOUNTER — Other Ambulatory Visit: Payer: Self-pay | Admitting: Family Medicine

## 2019-09-20 ENCOUNTER — Other Ambulatory Visit: Payer: Self-pay

## 2019-09-20 ENCOUNTER — Ambulatory Visit: Payer: Self-pay | Attending: Family Medicine | Admitting: Family Medicine

## 2019-09-20 VITALS — BP 116/73 | HR 92 | Ht 61.0 in | Wt 157.0 lb

## 2019-09-20 DIAGNOSIS — M25511 Pain in right shoulder: Secondary | ICD-10-CM

## 2019-09-20 DIAGNOSIS — E7800 Pure hypercholesterolemia, unspecified: Secondary | ICD-10-CM

## 2019-09-20 DIAGNOSIS — F258 Other schizoaffective disorders: Secondary | ICD-10-CM

## 2019-09-20 DIAGNOSIS — I10 Essential (primary) hypertension: Secondary | ICD-10-CM

## 2019-09-20 MED ORDER — IBUPROFEN 600 MG PO TABS: 600.0000 mg | ORAL_TABLET | Freq: Three times a day (TID) | ORAL | 0 refills | Status: DC | PRN

## 2019-09-20 MED ORDER — AMLODIPINE BESYLATE 5 MG PO TABS: 5.0000 mg | ORAL_TABLET | Freq: Every day | ORAL | 2 refills | Status: DC

## 2019-09-20 MED ORDER — PREDNISONE 20 MG PO TABS: 20.0000 mg | ORAL_TABLET | Freq: Every day | ORAL | 0 refills | Status: DC

## 2019-09-20 MED ORDER — LISINOPRIL 40 MG PO TABS: 40.0000 mg | ORAL_TABLET | Freq: Every day | ORAL | 2 refills | Status: DC

## 2019-09-20 MED ORDER — HYDROCHLOROTHIAZIDE 25 MG PO TABS: 25.0000 mg | ORAL_TABLET | Freq: Every day | ORAL | 2 refills | Status: DC

## 2019-09-20 MED ORDER — ATORVASTATIN CALCIUM 20 MG PO TABS: 20.0000 mg | ORAL_TABLET | Freq: Every day | ORAL | 2 refills | Status: DC

## 2019-09-20 MED FILL — ?IBUPROFEN 600 MG TABLETS: 600 | 10 days supply | Qty: 30 | Fill #0

## 2019-09-20 MED FILL — ?ATORVASTATIN 20 MG TABLET: 20 | 30 days supply | Qty: 30 | Fill #0

## 2019-09-20 MED FILL — ?PREDNISONE 20MG TABLET: 20 | 5 days supply | Qty: 5 | Fill #0

## 2019-09-20 NOTE — Progress Notes (Signed)
Subjective:  Patient ID: Cindy Garrison, female    DOB: Jul 18, 1970  Age: 49 y.o. MRN: [id number]  CC: Hypertension   HPI Cindy Garrison is a 49 year old female patient of Dr Chapman Fitch with a history of hypertension, hyperlipidemia, schizoaffective disorder who presents today for chronic disease management.  Schizoaffective disorder is managed by psych, Dr.Kaur with her last visit on 09/17/2019. Endorses compliance with her antihypertensive and her statin and she is tolerating her medications okay with no adverse effects.  Complains of posterior  R shoulder pain worse when she is working and when she sleeps on the R side. Denies heavy lifting, history of trauma.  Pain does not radiate down her right upper extremity and she denies presence of numbness or tingling and denies dropping things. She is right-handed. She is unable to identify alleviating factors.  Past Medical History:  Diagnosis Date  . #[id number]   . Acne   . Back pain   . Essential hypertension   . Seasonal allergies   . Syncope 09/16/2014  . Syncope and collapse 09/16/2014    Past Surgical History:  Procedure Laterality Date  . DILITATION & CURRETTAGE/HYSTROSCOPY WITH NOVASURE ABLATION N/A 12/22/2017   Procedure: DILATATION & CURETTAGE/ HYSTEROSCOPY WITH ENDOMETRIAL FIBROID AND  NOVASURE ABLATION;  Surgeon: Lavonia Drafts, MD;  Location: [facility];  Service: Gynecology;  Laterality: N/A;    History reviewed. No pertinent family history.  Allergies  Allergen Reactions  . Other Rash    Blood pressure pill (unknown name)    Outpatient Medications Prior to Visit  Medication Sig Dispense Refill  . amLODipine (NORVASC) 5 MG tablet Take 1 tablet (5 mg total) by mouth daily. 30 tablet 2  . atorvastatin (LIPITOR) 20 MG tablet Take 1 tablet (20 mg total) by mouth daily. To lower cholesterol 30 tablet 6  . diclofenac sodium (VOLTAREN) 1 % GEL Apply 2 g topically 4 (four) times daily. As needed for pain relief 2 Tube  11  . haloperidol (HALDOL) 2 MG tablet Take 1 tablet (2 mg total) by mouth at bedtime. 30 tablet 2  . hydrochlorothiazide (HYDRODIURIL) 25 MG tablet Take 1 tablet (25 mg total) by mouth daily. 30 tablet 2  . ibuprofen (ADVIL) 600 MG tablet Take 1 tablet (600 mg total) by mouth every 8 (eight) hours as needed for moderate pain. Take after eating 30 tablet 0  . lisinopril (ZESTRIL) 40 MG tablet Take 1 tablet (40 mg total) by mouth daily. To lower blood pressure 30 tablet 2  . megestrol (MEGACE) 20 MG tablet TAKE 2 TABLETS (40 MG TOTAL) BY MOUTH 2 (TWO) TIMES DAILY. (Patient not taking: Reported on 06/20/2019) 60 tablet 3  . megestrol (MEGACE) 40 MG tablet Take 1 tablet (40 mg total) by mouth 2 (two) times daily. Can increase to two tablets twice a day in the event of heavy bleeding (Patient not taking: Reported on 06/20/2019) 60 tablet 5  . Vitamin D, Ergocalciferol, (DRISDOL) 1.25 MG (50000 UNIT) CAPS capsule Take 1 capsule (50,000 Units total) by mouth every 7 (seven) days. (Patient not taking: Reported on 09/20/2019) 16 capsule 0   No facility-administered medications prior to visit.     ROS Review of Systems  Constitutional: Negative for activity change, appetite change and fatigue.  HENT: Negative for congestion, sinus pressure and sore throat.   Eyes: Negative for visual disturbance.  Respiratory: Negative for cough, chest tightness, shortness of breath and wheezing.   Cardiovascular: Negative for chest pain and palpitations.  Gastrointestinal: Negative  for abdominal distention, abdominal pain and constipation.  Endocrine: Negative for polydipsia.  Genitourinary: Negative for dysuria and frequency.  Musculoskeletal:       See HPI  Skin: Negative for rash.  Neurological: Negative for tremors, light-headedness and numbness.  Hematological: Does not bruise/bleed easily.  Psychiatric/Behavioral: Negative for agitation and behavioral problems.    Objective:  BP 116/73   Pulse 92   Ht 5\' 1"   (1.549 m)   Wt 157 lb (71.2 kg)   LMP 09/12/2017 (LMP Unknown)   SpO2 97%   BMI 29.66 kg/m   BP/Weight 09/20/2019 06/20/2019 4/43/1540  Systolic BP 086 761 950  Diastolic BP 73 78 78  Wt. (Lbs) 157 151.8 147.9  BMI 29.66 28.68 27.95      Physical Exam Constitutional:      Appearance: She is well-developed.  Neck:     Vascular: No JVD.  Cardiovascular:     Rate and Rhythm: Normal rate.     Heart sounds: Normal heart sounds. No murmur heard.   Pulmonary:     Effort: Pulmonary effort is normal.     Breath sounds: Normal breath sounds. No wheezing or rales.  Chest:     Chest wall: No tenderness.  Abdominal:     General: Bowel sounds are normal. There is no distension.     Palpations: Abdomen is soft. There is no mass.     Tenderness: There is no abdominal tenderness.  Musculoskeletal:     Right lower leg: No edema.     Left lower leg: No edema.     Comments: Tenderness on palpation of right posterior and right anterior shoulder joint Right shoulder abduction limited to 90 degrees with associated tenderness past that point. Negative Hawkins sign  Neurological:     Mental Status: She is alert and oriented to person, place, and time.  Psychiatric:        Mood and Affect: Mood normal.     CMP Latest Ref Rng & Units 06/20/2019 01/30/2019 08/24/2018  Glucose 65 - 99 mg/dL 76 98 76  BUN 6 - 24 mg/dL 10 7 7   Creatinine 0.57 - 1.00 mg/dL 0.75 0.71 0.58  Sodium 134 - 144 mmol/L 136 135 139  Potassium 3.5 - 5.2 mmol/L 4.3 3.0(L) 3.7  Chloride 96 - 106 mmol/L 100 95(L) 99  CO2 20 - 29 mmol/L 20 26 22   Calcium 8.7 - 10.2 mg/dL 9.7 9.1 9.1  Total Protein 6.0 - 8.5 g/dL 7.1 7.7 7.5  Total Bilirubin 0.0 - 1.2 mg/dL 0.3 0.6 0.6  Alkaline Phos 48 - 121 IU/L 51 75 63  AST 0 - 40 IU/L 19 18 13   ALT 0 - 32 IU/L 21 35 12    Lipid Panel     Component Value Date/Time   CHOL 247 (H) 11/14/2017 1031   TRIG 155 (H) 11/14/2017 1031   HDL 56 11/14/2017 1031   CHOLHDL 4.4 11/14/2017 1031    LDLCALC 160 (H) 11/14/2017 1031    CBC    Component Value Date/Time   WBC 6.5 06/20/2019 1102   WBC 7.9 01/30/2019 0608   RBC 4.66 06/20/2019 1102   RBC 4.54 01/30/2019 0608   HGB 12.7 06/20/2019 1102   HCT 36.9 06/20/2019 1102   PLT 225 06/20/2019 1102   MCV 79 06/20/2019 1102   MCH 27.3 06/20/2019 1102   MCH 26.0 01/30/2019 0608   MCHC 34.4 06/20/2019 1102   MCHC 34.2 01/30/2019 0608   RDW 15.1 06/20/2019 1102  LYMPHSABS 2.7 06/20/2019 1102   MONOABS 0.7 03/17/2017 1105   EOSABS 0.2 06/20/2019 1102   BASOSABS 0.0 06/20/2019 1102    Lab Results  Component Value Date   HGBA1C 5.5 01/24/2018    Assessment & Plan:  1. Essential hypertension Controlled Counseled on blood pressure goal of less than 130/80, low-sodium, DASH diet, medication compliance, 150 minutes of moderate intensity exercise per week. Discussed medication compliance, adverse effects. - amLODipine (NORVASC) 5 MG tablet; Take 1 tablet (5 mg total) by mouth daily.  Dispense: 30 tablet; Refill: 2 - hydrochlorothiazide (HYDRODIURIL) 25 MG tablet; Take 1 tablet (25 mg total) by mouth daily.  Dispense: 30 tablet; Refill: 2 - lisinopril (ZESTRIL) 40 MG tablet; Take 1 tablet (40 mg total) by mouth daily. To lower blood pressure  Dispense: 30 tablet; Refill: 2  2. Pure hypercholesterolemia Uncontrolled from 2019 She is due for repeat lipid panel Low-cholesterol diet - atorvastatin (LIPITOR) 20 MG tablet; Take 1 tablet (20 mg total) by mouth daily. To lower cholesterol  Dispense: 30 tablet; Refill: 2  3. Acute pain of right shoulder No underlying history of trauma Suspicion for rotator cuff tendinopathy We will treat for the acute course of prednisone and was completed she will commence ibuprofen - ibuprofen (ADVIL) 600 MG tablet; Take 1 tablet (600 mg total) by mouth every 8 (eight) hours as needed for moderate pain. Take after completing course of prednisone  Dispense: 30 tablet; Refill: 0 - predniSONE  (DELTASONE) 20 MG tablet; Take 1 tablet (20 mg total) by mouth daily with breakfast.  Dispense: 5 tablet; Refill: 0  4. Other schizoaffective disorders ([place]) Stable Management as per psych    No orders of the defined types were placed in this encounter.   Follow-up: Return in about 3 months (around 12/20/2019) for Dr Chapman Fitch - chronic conditions.       Charlott Rakes, MD, FAAFP. [facility] and Geraldine [place], [place]   09/20/2019, 9:46 AM

## 2019-09-20 NOTE — Progress Notes (Signed)
Having pain in right shoulder.

## 2019-09-20 NOTE — Patient Instructions (Signed)
Shoulder Pain Many things can cause shoulder pain, including:  An injury.  Moving the shoulder in the same way again and again (overuse).  Joint pain (arthritis). Pain can come from:  Swelling and irritation (inflammation) of any part of the shoulder.  An injury to the shoulder joint.  An injury to: ? Tissues that connect muscle to bone (tendons). ? Tissues that connect bones to each other (ligaments). ? Bones. Follow these instructions at home: Watch for changes in your symptoms. Let your doctor know about them. Follow these instructions to help with your pain. If you have a sling:  Wear the sling as told by your doctor. Remove it only as told by your doctor.  Loosen the sling if your fingers: ? Tingle. ? Become numb. ? Turn cold and blue.  Keep the sling clean.  If the sling is not waterproof: ? Do not let it get wet. ? Take the sling off when you shower or bathe. Managing pain, stiffness, and swelling   If told, put ice on the painful area: ? Put ice in a plastic bag. ? Place a towel between your skin and the bag. ? Leave the ice on for 20 minutes, 2-3 times a day. Stop putting ice on if it does not help with the pain.  Squeeze a soft ball or a foam pad as much as possible. This prevents swelling in the shoulder. It also helps to strengthen the arm. General instructions  Take over-the-counter and prescription medicines only as told by your doctor.  Keep all follow-up visits as told by your doctor. This is important. Contact a doctor if:  Your pain gets worse.  Medicine does not help your pain.  You have new pain in your arm, hand, or fingers. Get help right away if:  Your arm, hand, or fingers: ? Tingle. ? Are numb. ? Are swollen. ? Are painful. ? Turn white or blue. Summary  Shoulder pain can be caused by many things. These include injury, moving the shoulder in the same away again and again, and joint pain.  Watch for changes in your symptoms.  Let your doctor know about them.  This condition may be treated with a sling, ice, and pain medicine.  Contact your doctor if the pain gets worse or you have new pain. Get help right away if your arm, hand, or fingers tingle or get numb, swollen, or painful.  Keep all follow-up visits as told by your doctor. This is important. This information is not intended to replace advice given to you by your health care provider. Make sure you discuss any questions you have with your health care provider. Document Revised: 07/19/2017 Document Reviewed: 07/19/2017 Elsevier Patient Education  2020 [employer]vier Inc.  

## 2019-10-01 MED FILL — HYDROCHLOROTHIAZIDE 25 MG T: 25 | 30 days supply | Qty: 30 | Fill #2

## 2019-10-01 MED FILL — ?HALOPERIDOL 2 MG TABLET: 2 | 30 days supply | Qty: 30 | Fill #2

## 2019-10-01 MED FILL — LISINOPRIL 40 MG TABLET: 40 | 30 days supply | Qty: 30 | Fill #0

## 2019-10-01 MED FILL — VIT D2 1.25 MG (50,000 UNIT: 1.25 MG | 28 days supply | Qty: 4 | Fill #3

## 2019-10-01 MED FILL — AMLODIPINE BESYLATE 5 MG TA: 5 | 30 days supply | Qty: 30 | Fill #2

## 2019-10-04 ENCOUNTER — Other Ambulatory Visit: Payer: Self-pay | Admitting: Obstetrics and Gynecology

## 2019-10-04 DIAGNOSIS — N939 Abnormal uterine and vaginal bleeding, unspecified: Secondary | ICD-10-CM

## 2019-10-18 ENCOUNTER — Encounter: Payer: Self-pay | Admitting: Obstetrics and Gynecology

## 2019-10-18 ENCOUNTER — Telehealth: Payer: Self-pay

## 2019-10-18 ENCOUNTER — Other Ambulatory Visit: Payer: Self-pay

## 2019-10-18 ENCOUNTER — Ambulatory Visit (INDEPENDENT_AMBULATORY_CARE_PROVIDER_SITE_OTHER): Payer: Self-pay | Admitting: Obstetrics and Gynecology

## 2019-10-18 VITALS — BP 127/85 | HR 93 | Ht 61.0 in | Wt 151.9 lb

## 2019-10-18 DIAGNOSIS — D259 Leiomyoma of uterus, unspecified: Secondary | ICD-10-CM

## 2019-10-18 NOTE — Progress Notes (Signed)
Patient ID: Cindy Garrison, female   DOB: 18-Sep-1970, 49 y.o.   MRN: [id number] Ms Cindy Garrison presents for follow up. See Prior office notes. Off Megace and no more bleeding. She reports an occ right side pain after working hard. Denies any bowel or bladder dysfunction.  PE AF VSS Lungs clear Heart RRR Abd soft + BS  A/P Uterine fibroids        DUB  Bleeding has resolved. Suspect pain in musculoskeletal in origin. Instructed to use OTC Tylenol/Motrin as needed. Will check GYN U/S. F/U in 6 months. Live interrupter used during today's visit.

## 2019-10-18 NOTE — Progress Notes (Signed)
[place] SCHEDULED FOR 10/25/19 @ 9 AM, Pt to arrive at 8:45a w/ full bladder.

## 2019-10-18 NOTE — Progress Notes (Signed)
Pt states is not bleeding at this time but still having pain on her right side.

## 2019-10-22 NOTE — Telephone Encounter (Signed)
Opened in error

## 2019-10-25 ENCOUNTER — Ambulatory Visit: Admission: RE | Admit: 2019-10-25 | Payer: Self-pay | Source: Ambulatory Visit

## 2019-10-31 MED FILL — ATORVASTATIN CALCIUM 20 MG: 20 | 30 days supply | Qty: 30 | Fill #1

## 2019-10-31 MED FILL — LISINOPRIL 40 MG TABLET: 40 | 30 days supply | Qty: 30 | Fill #1

## 2019-10-31 MED FILL — HYDROCHLOROTHIAZIDE 25 MG T: 25 | 30 days supply | Qty: 30 | Fill #0

## 2019-10-31 MED FILL — AMLODIPINE BESYLATE 5 MG TA: 5 | 30 days supply | Qty: 30 | Fill #0

## 2019-11-30 ENCOUNTER — Other Ambulatory Visit: Payer: Self-pay

## 2019-11-30 ENCOUNTER — Other Ambulatory Visit: Payer: Self-pay | Admitting: Obstetrics & Gynecology

## 2019-11-30 DIAGNOSIS — N939 Abnormal uterine and vaginal bleeding, unspecified: Secondary | ICD-10-CM

## 2019-11-30 MED ORDER — MEGESTROL ACETATE 20 MG PO TABS: 40.0000 mg | ORAL_TABLET | Freq: Two times a day (BID) | ORAL | 3 refills | Status: DC

## 2019-11-30 MED FILL — MEGESTROL 20 MG TABLET: 20 | 30 days supply | Qty: 120 | Fill #0

## 2019-11-30 MED FILL — ?HALOPERIDOL 2 MG TABLET: 2 | 30 days supply | Qty: 30 | Fill #0

## 2019-11-30 MED FILL — LISINOPRIL 40 MG TABLET: 40 | 30 days supply | Qty: 30 | Fill #2

## 2019-11-30 MED FILL — ATORVASTATIN CALCIUM 20 MG: 20 | 30 days supply | Qty: 30 | Fill #2

## 2019-11-30 MED FILL — AMLODIPINE BESYLATE 5 MG TA: 5 | 30 days supply | Qty: 30 | Fill #1

## 2019-11-30 MED FILL — HYDROCHLOROTHIAZIDE 25 MG T: 25 | 30 days supply | Qty: 30 | Fill #1

## 2019-11-30 NOTE — Telephone Encounter (Signed)
Pt came to front desk requesting a paper Rx for her Megace so that she can take it to the pharmacy of her choice.  Per [place] pt can have a refill on Megace.  Pt provided with paper Rx.    Mel Almond, RN  11/30/19

## 2019-12-04 ENCOUNTER — Other Ambulatory Visit: Payer: Self-pay

## 2019-12-04 ENCOUNTER — Other Ambulatory Visit (HOSPITAL_COMMUNITY): Payer: Self-pay | Admitting: Psychiatry

## 2019-12-04 ENCOUNTER — Encounter (HOSPITAL_COMMUNITY): Payer: Self-pay | Admitting: Psychiatry

## 2019-12-04 ENCOUNTER — Ambulatory Visit (INDEPENDENT_AMBULATORY_CARE_PROVIDER_SITE_OTHER): Payer: No Payment, Other | Admitting: Psychiatry

## 2019-12-04 VITALS — BP 145/92 | HR 92 | Ht 61.0 in | Wt 156.0 lb

## 2019-12-04 DIAGNOSIS — F259 Schizoaffective disorder, unspecified: Secondary | ICD-10-CM | POA: Diagnosis not present

## 2019-12-04 MED ORDER — HALOPERIDOL 2 MG PO TABS: 2.0000 mg | ORAL_TABLET | Freq: Every day | ORAL | 3 refills | Status: DC

## 2019-12-04 NOTE — Progress Notes (Signed)
[facility] Progress Note    Patient Identification: Cindy Garrison MRN:  [id number] Date of Evaluation:  12/04/2019   Patient was seen with the help of [place] interpreter via EMS services.  Chief Complaint:  " I am doing very well."   Visit Diagnosis:    ICD-10-CM   1. Schizoaffective disorder, unspecified type ([place])  F25.9     History of Present Illness: Pt seen for f/up. She informed she has been doing well. She has been taking haldol 2 mg HS regularly. She reported that she has been sleeping well. Her mood has been stable. She denied any hallucinations or delusions. She denied any suicidal or homicidal ideations. She is still working at the [employer]. When asked regarding her plans for the holidays, she denied any plans of traveling.  Past Psychiatric History: Schizoaffective disorder, Has been on Haldol for 10 years.  Was being seen at [facility] in the past.  Past Medical History:  Past Medical History:  Diagnosis Date  . #[id number]   . Acne   . Back pain   . Essential hypertension   . Seasonal allergies   . Syncope 09/16/2014  . Syncope and collapse 09/16/2014    Past Surgical History:  Procedure Laterality Date  . DILITATION & CURRETTAGE/HYSTROSCOPY WITH NOVASURE ABLATION N/A 12/22/2017   Procedure: DILATATION & CURETTAGE/ HYSTEROSCOPY WITH ENDOMETRIAL FIBROID AND  NOVASURE ABLATION;  Surgeon: Lavonia Drafts, MD;  Location: [facility];  Service: Gynecology;  Laterality: N/A;    Family Psychiatric History: denied  Family History: No family history on file.  Social History:   Social History   Socioeconomic History  . Marital status: Married    Spouse name: Not on file  . Number of children: Not on file  . Years of education: Not on file  . Highest education level: Not on file  Occupational History  . Not on file  Tobacco Use  . Smoking status: Never Smoker  . Smokeless tobacco: Never Used  Vaping Use  . Vaping Use: Never used  Substance and Sexual  Activity  . Alcohol use: Not Currently    Comment: occ.  . Drug use: No  . Sexual activity: Not on file    Comment: monogamous female partner for 5 years (2010)  Other Topics Concern  . Not on file  Social History Narrative  . Not on file   Social Determinants of Health   Financial Resource Strain:   . Difficulty of Paying Living Expenses: Not on file  Food Insecurity:   . Worried About Charity fundraiser in the Last Year: Not on file  . Ran Out of Food in the Last Year: Not on file  Transportation Needs:   . Lack of Transportation (Medical): Not on file  . Lack of Transportation (Non-Medical): Not on file  Physical Activity:   . Days of Exercise per Week: Not on file  . Minutes of Exercise per Session: Not on file  Stress:   . Feeling of Stress : Not on file  Social Connections:   . Frequency of Communication with Friends and Family: Not on file  . Frequency of Social Gatherings with Friends and Family: Not on file  . Attends Religious Services: Not on file  . Active Member of Clubs or Organizations: Not on file  . Attends Archivist Meetings: Not on file  . Marital Status: Not on file    Additional S Allergies:   Allergies  Allergen Reactions  . Other Rash  Blood pressure pill (unknown name)    Metabolic Disorder Labs: Lab Results  Component Value Date   HGBA1C 5.5 01/24/2018   No results found for: PROLACTIN Lab Results  Component Value Date   CHOL 247 (H) 11/14/2017   TRIG 155 (H) 11/14/2017   HDL 56 11/14/2017   CHOLHDL 4.4 11/14/2017   LDLCALC 160 (H) 11/14/2017   LDLCALC 151 (H) 12/17/2016   Lab Results  Component Value Date   TSH 1.260 06/20/2019    Therapeutic Level Labs: No results found for: LITHIUM No results found for: CBMZ No results found for: VALPROATE  Current Medications: Current Outpatient Medications  Medication Sig Dispense Refill  . amLODipine (NORVASC) 5 MG tablet Take 1 tablet (5 mg total) by mouth daily. 30  tablet 2  . atorvastatin (LIPITOR) 20 MG tablet Take 1 tablet (20 mg total) by mouth daily. To lower cholesterol 30 tablet 2  . diclofenac sodium (VOLTAREN) 1 % GEL Apply 2 g topically 4 (four) times daily. As needed for pain relief 2 Tube 11  . haloperidol (HALDOL) 2 MG tablet Take 1 tablet (2 mg total) by mouth at bedtime. 30 tablet 2  . hydrochlorothiazide (HYDRODIURIL) 25 MG tablet Take 1 tablet (25 mg total) by mouth daily. 30 tablet 2  . ibuprofen (ADVIL) 600 MG tablet Take 1 tablet (600 mg total) by mouth every 8 (eight) hours as needed for moderate pain. Take after completing course of prednisone 30 tablet 0  . lisinopril (ZESTRIL) 40 MG tablet Take 1 tablet (40 mg total) by mouth daily. To lower blood pressure 30 tablet 2  . megestrol (MEGACE) 20 MG tablet Take 2 tablets (40 mg total) by mouth 2 (two) times daily. 120 tablet 3  . predniSONE (DELTASONE) 20 MG tablet Take 1 tablet (20 mg total) by mouth daily with breakfast. 5 tablet 0  . Vitamin D, Ergocalciferol, (DRISDOL) 1.25 MG (50000 UNIT) CAPS capsule Take 1 capsule (50,000 Units total) by mouth every 7 (seven) days. 16 capsule 0   No current facility-administered medications for this visit.    Musculoskeletal: Strength & Muscle Tone: within normal limits Gait & Station: normal Patient leans: N/A  Psychiatric Specialty Exam: Review of Systems  Blood pressure (!) 145/92, pulse 92, height 5\' 1"  (1.549 m), weight 156 lb (70.8 kg), last menstrual period 09/12/2017, SpO2 100 %.Body mass index is 29.48 kg/m.  General Appearance: Well Groomed  Eye Contact:  Good  Speech:  Clear and Coherent and Normal Rate  Volume:  Normal  Mood:  Euthymic  Affect:  Constricted  Thought Process:  Goal Directed and Descriptions of Associations: Intact  Orientation:  Full (Time, Place, and Person)  Thought Content:  Logical  Suicidal Thoughts:  No  Homicidal Thoughts:  No  Memory:  Immediate;   Good Recent;   Good  Judgement:  Fair  Insight:   Fair  Psychomotor Activity:  Normal  Concentration:  Concentration: Good and Attention Span: Good  Recall:  Good  Fund of Knowledge:Good  Language: Good  Akathisia:  Negative  Handed:  Right  AIMS (if indicated):  0  Assets:  Communication Skills Desire for Improvement Financial Resources/Insurance Housing  ADL's:  Intact  Cognition: WNL  Sleep:  Good   Screenings: GAD-7     Office Visit from 10/18/2019 in [place] for [employer] at [facility] Office Visit from 03/16/2019 in [place] Office Visit from 03/01/2019 in [place]  Visit from 03/23/2017 in [place] Office Visit from 02/23/2016 in [place]  Total GAD-7 Score 3 0 0 2 0    PHQ2-9     Office Visit from 10/18/2019 in [place] for [place] at [facility] Office Visit from 03/16/2019 in [place] Office Visit from 03/01/2019 in [place] Office Visit from 02/04/2018 in [facility] at [facility] Visit from 01/31/2018 in [place] for [facility]  PHQ-2 Total Score 4 0 0 0 0  PHQ-9 Total Score 10 -- -- -- 2      Assessment and Plan: Patient appears to be stable on current regimen.  1. Schizoaffective disorder, unspecified type ([place])  - Continue haloperidol (HALDOL) 2 MG tablet; Take 1 tablet (2 mg total) by mouth at bedtime.  Dispense: 30 tablet; Refill: 2   Continue same medication regimen. Follow up in 4 months.   [place] Crane, MD 11/16/20218:58 AM

## 2019-12-17 ENCOUNTER — Ambulatory Visit (INDEPENDENT_AMBULATORY_CARE_PROVIDER_SITE_OTHER): Payer: Self-pay | Admitting: Obstetrics and Gynecology

## 2019-12-17 ENCOUNTER — Encounter: Payer: Self-pay | Admitting: Obstetrics and Gynecology

## 2019-12-17 ENCOUNTER — Other Ambulatory Visit: Payer: Self-pay

## 2019-12-17 VITALS — BP 144/88 | HR 82 | Ht 61.0 in | Wt 158.7 lb

## 2019-12-17 DIAGNOSIS — N938 Other specified abnormal uterine and vaginal bleeding: Secondary | ICD-10-CM

## 2019-12-17 DIAGNOSIS — D259 Leiomyoma of uterus, unspecified: Secondary | ICD-10-CM

## 2019-12-17 NOTE — Progress Notes (Signed)
Cindy Garrison presents for follow up for her DUB and uterine fibroids. Last seen in Sept. At that time she was doing well, off Megace and had no bleeding She did report some RLQ pain, thought to be musleskeletal in origin. GYN U/S was schedule but pt did not show.  Today she still reports some RLQ pain at times. It appears she restarted her Megace but I am not sure why She reports no problems with bleeding again today.  PE AF VSS Lungs clear Heart RRR Abd soft + BS   A/P DUB        Uterine Fibroids        RLQ pain.  Pt instructed to d/c Megace. We will reschedule her GYN U/S. To sss PCP for BP. F/U in 3 months Live interrupter used during today's visit

## 2019-12-18 ENCOUNTER — Ambulatory Visit: Payer: Self-pay | Attending: Family Medicine

## 2019-12-18 ENCOUNTER — Other Ambulatory Visit: Payer: Self-pay

## 2019-12-24 ENCOUNTER — Ambulatory Visit
Admission: RE | Admit: 2019-12-24 | Discharge: 2019-12-24 | Disposition: A | Discharge status: Home / Self Care | Payer: Self-pay | Source: Ambulatory Visit | Attending: Obstetrics and Gynecology | Admitting: Obstetrics and Gynecology

## 2019-12-24 ENCOUNTER — Other Ambulatory Visit: Payer: Self-pay

## 2019-12-24 DIAGNOSIS — D259 Leiomyoma of uterus, unspecified: Secondary | ICD-10-CM | POA: Insufficient documentation

## 2019-12-24 DIAGNOSIS — N938 Other specified abnormal uterine and vaginal bleeding: Secondary | ICD-10-CM | POA: Insufficient documentation

## 2019-12-26 ENCOUNTER — Telehealth: Payer: Self-pay

## 2019-12-26 ENCOUNTER — Telehealth: Payer: Self-pay | Admitting: Family Medicine

## 2019-12-26 MED FILL — MEGESTROL 20 MG TABLET: 20 | 30 days supply | Qty: 120 | Fill #1

## 2019-12-26 NOTE — Telephone Encounter (Addendum)
Called Pt. Using [employer] [place] id# [phone] to go over [place] results, explained to pt is she's still having pain she needs to schedule Appointment with her PCP. Pt states that she has an appointment for PCP in January, advised may need to get an earlier appointment, Pt verbalized understanding.

## 2019-12-26 NOTE — Telephone Encounter (Signed)
-----   Message from Chancy Milroy, MD sent at 12/26/2019  9:16 AM EST ----- Please let Ms Gavina that her U/S demonstrated no GYN cause for RLQ pain except for her fibroids..  Thanks Legrand Como

## 2019-12-26 NOTE — Telephone Encounter (Signed)
I try to contact the Pt to inform her that am missing the last page of her husband bank statement for each month, I need page #3 for each month to complete her application

## 2019-12-28 ENCOUNTER — Ambulatory Visit: Payer: Self-pay | Admitting: Family Medicine

## 2020-01-08 ENCOUNTER — Other Ambulatory Visit: Payer: Self-pay | Admitting: Family Medicine

## 2020-01-08 DIAGNOSIS — I10 Essential (primary) hypertension: Secondary | ICD-10-CM

## 2020-01-08 MED FILL — ?ATORVASTATIN 20 MG TABLET: 20 | 30 days supply | Qty: 30 | Fill #2

## 2020-01-08 MED FILL — AMLODIPINE BESYLATE 5 MG TA: 5 | 30 days supply | Qty: 30 | Fill #2

## 2020-01-08 MED FILL — HYDROCHLOROTHIAZIDE 25 MG T: 25 | 30 days supply | Qty: 30 | Fill #2

## 2020-01-08 MED FILL — LISINOPRIL 40 MG TABS: 40 | 30 days supply | Qty: 30 | Fill #0

## 2020-01-08 MED FILL — ?HALOPERIDOL 2 MG TABLET: 2 | 30 days supply | Qty: 30 | Fill #1

## 2020-01-23 ENCOUNTER — Other Ambulatory Visit: Payer: Self-pay

## 2020-01-23 ENCOUNTER — Encounter: Payer: Self-pay | Admitting: Critical Care Medicine

## 2020-01-23 ENCOUNTER — Other Ambulatory Visit: Payer: Self-pay | Admitting: Critical Care Medicine

## 2020-01-23 ENCOUNTER — Ambulatory Visit: Payer: Self-pay | Attending: Critical Care Medicine | Admitting: Critical Care Medicine

## 2020-01-23 VITALS — BP 145/87 | HR 87 | Temp 98.4°F | Resp 16 | Wt 161.8 lb

## 2020-01-23 DIAGNOSIS — Z1211 Encounter for screening for malignant neoplasm of colon: Secondary | ICD-10-CM

## 2020-01-23 DIAGNOSIS — Z124 Encounter for screening for malignant neoplasm of cervix: Secondary | ICD-10-CM

## 2020-01-23 DIAGNOSIS — F259 Schizoaffective disorder, unspecified: Secondary | ICD-10-CM

## 2020-01-23 DIAGNOSIS — Z1159 Encounter for screening for other viral diseases: Secondary | ICD-10-CM

## 2020-01-23 DIAGNOSIS — E559 Vitamin D deficiency, unspecified: Secondary | ICD-10-CM | POA: Insufficient documentation

## 2020-01-23 DIAGNOSIS — I10 Essential (primary) hypertension: Secondary | ICD-10-CM

## 2020-01-23 DIAGNOSIS — E7800 Pure hypercholesterolemia, unspecified: Secondary | ICD-10-CM

## 2020-01-23 MED ORDER — AMLODIPINE BESYLATE 10 MG PO TABS: 10.0000 mg | ORAL_TABLET | Freq: Every day | ORAL | 1 refills | Status: DC

## 2020-01-23 MED ORDER — VALSARTAN-HYDROCHLOROTHIAZIDE 160-25 MG PO TABS: 1.0000 | ORAL_TABLET | Freq: Every day | ORAL | 1 refills | Status: DC

## 2020-01-23 MED ORDER — ATORVASTATIN CALCIUM 20 MG PO TABS: 20.0000 mg | ORAL_TABLET | Freq: Every day | ORAL | 2 refills | Status: DC

## 2020-01-23 MED ORDER — VITAMIN D (ERGOCALCIFEROL) 1.25 MG (50000 UNIT) PO CAPS
50000.0000 [IU] | ORAL_CAPSULE | ORAL | 1 refills | Status: DC
Start: 2020-01-23 — End: 2020-01-23

## 2020-01-23 MED FILL — MEGESTROL 20 MG TABLET: 20 | 30 days supply | Qty: 120 | Fill #2

## 2020-01-23 MED FILL — AMLODIPINE BESYLATE 10 MG T: 10 | 30 days supply | Qty: 30 | Fill #0

## 2020-01-23 MED FILL — VALSARTAN-HCTZ 160-25 MG TA: 160-25 | 30 days supply | Qty: 30 | Fill #0

## 2020-01-23 MED FILL — VIT D2 1.25 MG (50,000 UNIT: 1.25 MG | 84 days supply | Qty: 12 | Fill #0

## 2020-01-23 NOTE — Assessment & Plan Note (Signed)
For colon cancer screening will give a fecal occult kit

## 2020-01-23 NOTE — Patient Instructions (Signed)
Resume vitamin D take 1 weekly Discontinue lisinopril and hydrochlorothiazide and begin valsartan HCT 1 daily Amlodipine will be increased to 10 mg daily Stay on atorvastatin daily for cholesterol  Labs today include lipid panel liver function hepatitis C assay  Your mammogram will be scheduled that is already been ordered  Obtain a fecal occult kit at the lab to screen for colon cancer Referral to Dr. Laurena Bering of gynecology will be made for a Pap smear  Follow a healthy diet as outlined below  Please obtain a Covid booster shot call the number below to see where you can obtain you the [employer] or [employer] booster COVID-19 Vaccine Information can be found at: [email or web address].uk For questions related to vaccine distribution or appointments, please email [email or web address] [email or web address] or call [phone].   Return to see Dr. Joya Gaskins 2 months   DASH Eating Plan DASH stands for "Dietary Approaches to Stop Hypertension." The DASH eating plan is a healthy eating plan that has been shown to reduce high blood pressure (hypertension). It may also reduce your risk for type 2 diabetes, heart disease, and stroke. The DASH eating plan may also help with weight loss. What are tips for following this plan?  General guidelines  Avoid eating more than 2,300 mg (milligrams) of salt (sodium) a day. If you have hypertension, you may need to reduce your sodium intake to 1,500 mg a day.  Limit alcohol intake to no more than 1 drink a day for nonpregnant women and 2 drinks a day for men. One drink equals 12 oz of beer, 5 oz of wine, or 1 oz of hard liquor.  Work with your health care provider to maintain a healthy body weight or to lose weight. Ask what an ideal weight is for you.  Get at least 30 minutes of exercise that causes your heart to beat faster (aerobic exercise) most days of the week. Activities may include walking, swimming, or  biking.  Work with your health care provider or diet and nutrition specialist (dietitian) to adjust your eating plan to your individual calorie needs. Reading food labels   Check food labels for the amount of sodium per serving. Choose foods with less than 5 percent of the Daily Value of sodium. Generally, foods with less than 300 mg of sodium per serving fit into this eating plan.  To find whole grains, look for the word "whole" as the first word in the ingredient list. Shopping  Buy products labeled as "low-sodium" or "no salt added."  Buy fresh foods. Avoid canned foods and premade or frozen meals. Cooking  Avoid adding salt when cooking. Use salt-free seasonings or herbs instead of table salt or sea salt. Check with your health care provider or pharmacist before using salt substitutes.  Do not fry foods. Cook foods using healthy methods such as baking, boiling, grilling, and broiling instead.  Cook with heart-healthy oils, such as olive, canola, soybean, or sunflower oil. Meal planning  Eat a balanced diet that includes: ? 5 or more servings of fruits and vegetables each day. At each meal, try to fill half of your plate with fruits and vegetables. ? Up to 6-8 servings of whole grains each day. ? Less than 6 oz of lean meat, poultry, or fish each day. A 3-oz serving of meat is about the same size as a deck of cards. One egg equals 1 oz. ? 2 servings of low-fat dairy each day. ? A serving of nuts, seeds, or beans 5 times each week. ?  Heart-healthy fats. Healthy fats called Omega-3 fatty acids are found in foods such as flaxseeds and coldwater fish, like sardines, salmon, and mackerel.  Limit how much you eat of the following: ? Canned or prepackaged foods. ? Food that is high in trans fat, such as fried foods. ? Food that is high in saturated fat, such as fatty meat. ? Sweets, desserts, sugary drinks, and other foods with added sugar. ? Full-fat dairy products.  Do not salt  foods before eating.  Try to eat at least 2 vegetarian meals each week.  Eat more home-cooked food and less restaurant, buffet, and fast food.  When eating at a restaurant, ask that your food be prepared with less salt or no salt, if possible. What foods are recommended? The items listed may not be a complete list. Talk with your dietitian about what dietary choices are best for you. Grains Whole-grain or whole-wheat bread. Whole-grain or whole-wheat pasta. Brown rice. [place]. Bulgur. Whole-grain and low-sodium cereals. Pita bread. Low-fat, low-sodium crackers. Whole-wheat flour tortillas. Vegetables Fresh or frozen vegetables (raw, steamed, roasted, or grilled). Low-sodium or reduced-sodium tomato and vegetable juice. Low-sodium or reduced-sodium tomato sauce and tomato paste. Low-sodium or reduced-sodium canned vegetables. Fruits All fresh, dried, or frozen fruit. Canned fruit in natural juice (without added sugar). Meat and other protein foods Skinless chicken or [place]. Ground chicken or [place]. Pork with fat trimmed off. Fish and seafood. Egg whites. Dried beans, peas, or lentils. Unsalted nuts, nut butters, and seeds. Unsalted canned beans. Lean cuts of beef with fat trimmed off. Low-sodium, lean deli meat. Dairy Low-fat (1%) or fat-free (skim) milk. Fat-free, low-fat, or reduced-fat cheeses. Nonfat, low-sodium ricotta or cottage cheese. Low-fat or nonfat yogurt. Low-fat, low-sodium cheese. Fats and oils Soft margarine without trans fats. Vegetable oil. Low-fat, reduced-fat, or light mayonnaise and salad dressings (reduced-sodium). Canola, safflower, olive, soybean, and sunflower oils. Avocado. Seasoning and other foods Herbs. Spices. Seasoning mixes without salt. Unsalted popcorn and pretzels. Fat-free sweets. What foods are not recommended? The items listed may not be a complete list. Talk with your dietitian about what dietary choices are best for you. Grains Baked goods  made with fat, such as croissants, muffins, or some breads. Dry pasta or rice meal packs. Vegetables Creamed or fried vegetables. Vegetables in a cheese sauce. Regular canned vegetables (not low-sodium or reduced-sodium). Regular canned tomato sauce and paste (not low-sodium or reduced-sodium). Regular tomato and vegetable juice (not low-sodium or reduced-sodium). Angie Fava. Olives. Fruits Canned fruit in a light or heavy syrup. Fried fruit. Fruit in cream or butter sauce. Meat and other protein foods Fatty cuts of meat. Ribs. Fried meat. Berniece Salines. Sausage. Bologna and other processed lunch meats. Salami. Fatback. Hotdogs. Bratwurst. Salted nuts and seeds. Canned beans with added salt. Canned or smoked fish. Whole eggs or egg yolks. Chicken or [place] with skin. Dairy Whole or 2% milk, cream, and half-and-half. Whole or full-fat cream cheese. Whole-fat or sweetened yogurt. Full-fat cheese. Nondairy creamers. Whipped toppings. Processed cheese and cheese spreads. Fats and oils Butter. Stick margarine. Lard. Shortening. Ghee. Bacon fat. Tropical oils, such as coconut, palm kernel, or palm oil. Seasoning and other foods Salted popcorn and pretzels. Onion salt, garlic salt, seasoned salt, table salt, and sea salt. Worcestershire sauce. Tartar sauce. Barbecue sauce. Teriyaki sauce. Soy sauce, including reduced-sodium. Steak sauce. Canned and packaged gravies. Fish sauce. Oyster sauce. Cocktail sauce. Horseradish that you find on the shelf. Ketchup. Mustard. Meat flavorings and tenderizers. Bouillon cubes. Hot sauce and Tabasco sauce.  Premade or packaged marinades. Premade or packaged taco seasonings. Relishes. Regular salad dressings. Where to find more information:  National Heart, Lung, and [place]: https://wilson-eaton.com/  American Heart Association: www.heart.org Summary  The DASH eating plan is a healthy eating plan that has been shown to reduce high blood pressure (hypertension). It may also reduce  your risk for type 2 diabetes, heart disease, and stroke.  With the DASH eating plan, you should limit salt (sodium) intake to 2,300 mg a day. If you have hypertension, you may need to reduce your sodium intake to 1,500 mg a day.  When on the DASH eating plan, aim to eat more fresh fruits and vegetables, whole grains, lean proteins, low-fat dairy, and heart-healthy fats.  Work with your health care provider or diet and nutrition specialist (dietitian) to adjust your eating plan to your individual calorie needs. This information is not intended to replace advice given to you by your health care provider. Make sure you discuss any questions you have with your health care provider. Document Revised: 12/17/2016 Document Reviewed: 12/29/2015 Elsevier Patient Education  2020 [employer].

## 2020-01-23 NOTE — Assessment & Plan Note (Signed)
Care per psychiatry continue haloperidol

## 2020-01-23 NOTE — Assessment & Plan Note (Signed)
Referral to gynecology who follows her for uterine fibroids for cervical cancer screening

## 2020-01-23 NOTE — Assessment & Plan Note (Signed)
History of vitamin D deficiency will refill 50,000 units vitamin D weekly

## 2020-01-23 NOTE — Assessment & Plan Note (Signed)
Follow-up lipid panel continue atorvastatin 20 mg daily refills given

## 2020-01-23 NOTE — Progress Notes (Signed)
Subjective:    Patient ID: Cindy Garrison, female    DOB: 23-Feb-1970, 50 y.o.   MRN: [id number]  49 y.o.F former [facility] pt.  Here to reest PCP Hx of HTN Schizophrenia, Iron def anemia  Last seen 09/2019 by Dr Margarita Rana  This patient has a history of hyperlipidemia hypertension schizoaffective disorder and here for follow-up chronic disease management.  Her mental health is managed by psychiatrist at the behavioral [place] and she is maintaining her haloperidol without difficulty.  Patient states she also maintains her blood pressure medicine but note on arrival blood pressure is elevated 145/87.  She states her right shoulder pain she had previously has now resolved.  Patient does take her cholesterol medication is due for repeat lipid panel and is fasting today. Note this patient had her Covid vaccine [employer] in May 2021 and she knows she needs a booster vaccine at this time  There are no new symptom complaints  The patient is deferring a flu vaccine at this time the patient does need a mammogram and colon cancer screening and Pap smear screening  The patient does see gynecology for her uterine fibroids and will need a Pap smear and a mammogram was ordered but she is yet to have this scheduled  Past Medical History:  Diagnosis Date  . #[id number]   . Acne   . Back pain   . Essential hypertension   . Seasonal allergies   . Syncope 09/16/2014  . Syncope and collapse 09/16/2014     History reviewed. No pertinent family history.   Social History   Socioeconomic History  . Marital status: Married    Spouse name: Not on file  . Number of children: Not on file  . Years of education: Not on file  . Highest education level: Not on file  Occupational History  . Not on file  Tobacco Use  . Smoking status: Never Smoker  . Smokeless tobacco: Never Used  Vaping Use  . Vaping Use: Never used  Substance and Sexual Activity  . Alcohol use: Not Currently    Comment:  occ.  . Drug use: No  . Sexual activity: Not on file    Comment: monogamous female partner for 5 years (2010)  Other Topics Concern  . Not on file  Social History Narrative  . Not on file   Social Determinants of Health   Financial Resource Strain: Not on file  Food Insecurity: Not on file  Transportation Needs: Not on file  Physical Activity: Not on file  Stress: Not on file  Social Connections: Not on file  Intimate Partner Violence: Not on file     Allergies  Allergen Reactions  . Other Rash    Blood pressure pill (unknown name)     Outpatient Medications Prior to Visit  Medication Sig Dispense Refill  . haloperidol (HALDOL) 2 MG tablet Take 1 tablet (2 mg total) by mouth at bedtime. 30 tablet 3  . amLODipine (NORVASC) 5 MG tablet Take 1 tablet (5 mg total) by mouth daily. 30 tablet 2  . atorvastatin (LIPITOR) 20 MG tablet Take 1 tablet (20 mg total) by mouth daily. To lower cholesterol 30 tablet 2  . hydrochlorothiazide (HYDRODIURIL) 25 MG tablet Take 1 tablet (25 mg total) by mouth daily. 30 tablet 2  . lisinopril (ZESTRIL) 40 MG tablet TAKE 1 TABLET (40 MG TOTAL) BY MOUTH DAILY. TO LOWER BLOOD PRESSURE 30 tablet 2  . diclofenac sodium (VOLTAREN) 1 % GEL  Apply 2 g topically 4 (four) times daily. As needed for pain relief (Patient not taking: Reported on 01/23/2020) 2 Tube 11  . ibuprofen (ADVIL) 600 MG tablet Take 1 tablet (600 mg total) by mouth every 8 (eight) hours as needed for moderate pain. Take after completing course of prednisone (Patient not taking: Reported on 01/23/2020) 30 tablet 0  . megestrol (MEGACE) 20 MG tablet Take 2 tablets (40 mg total) by mouth 2 (two) times daily. (Patient not taking: Reported on 01/23/2020) 120 tablet 3  . predniSONE (DELTASONE) 20 MG tablet Take 1 tablet (20 mg total) by mouth daily with breakfast. (Patient not taking: Reported on 01/23/2020) 5 tablet 0  . Vitamin D, Ergocalciferol, (DRISDOL) 1.25 MG (50000 UNIT) CAPS capsule Take 1 capsule  (50,000 Units total) by mouth every 7 (seven) days. (Patient not taking: Reported on 01/23/2020) 16 capsule 0   No facility-administered medications prior to visit.     Review of Systems  Constitutional: Negative.   HENT: Negative.   Eyes: Negative.   Respiratory: Negative.   Gastrointestinal: Negative.   Endocrine: Negative.   Genitourinary: Negative.   Musculoskeletal: Negative.        Objective:   Physical Exam Vitals:   01/23/20 0900  BP: (!) 145/87  Pulse: 87  Resp: 16  Temp: 98.4 F (36.9 C)  SpO2: 100%  Weight: 161 lb 12.8 oz (73.4 kg)    Gen: Pleasant, well-nourished, in no distress,  normal affect  ENT: No lesions,  mouth clear,  oropharynx clear, no postnasal drip  Neck: No JVD, no TMG, no carotid bruits  Lungs: No use of accessory muscles, no dullness to percussion, clear without rales or rhonchi  Cardiovascular: RRR, heart sounds normal, no murmur or gallops, no peripheral edema  Abdomen: soft and NT, no HSM,  BS normal  Musculoskeletal: No deformities, no cyanosis or clubbing  Neuro: alert, non focal  Skin: Warm, no lesions or rashes        Assessment & Plan:  I personally reviewed all images and lab data in the [facility] system as well as any outside material available during this office visit and agree with the  radiology impressions.   Essential hypertension Blood pressure not well controlled plan will be to discontinue hydrochlorothiazide and lisinopril and begin valsartan HCT 160/25 daily she will increase amlodipine to 10 mg daily  I reviewed with the patient a low-salt diet  Pure hypercholesterolemia Follow-up lipid panel continue atorvastatin 20 mg daily refills given  Schizoaffective disorder [facility]) Care per psychiatry continue haloperidol  Colon cancer screening For colon cancer screening will give a fecal occult kit  Cervical cancer screening Referral to gynecology who follows her for uterine fibroids for cervical cancer  screening  Vitamin D deficiency History of vitamin D deficiency will refill 50,000 units vitamin D weekly   Benna was seen today for hypertension and re-establish care.  Diagnoses and all orders for this visit:  Cervical cancer screening -     Ambulatory referral to Gynecology  Essential hypertension -     amLODipine (NORVASC) 10 MG tablet; Take 1 tablet (10 mg total) by mouth daily.  Pure hypercholesterolemia -     atorvastatin (LIPITOR) 20 MG tablet; Take 1 tablet (20 mg total) by mouth daily. To lower cholesterol -     Lipid panel -     Hepatic function panel  Need for hepatitis C screening test -     HCV Ab w Reflex to Quant PCR  Colon cancer  screening -     Fecal occult blood, imunochemical  Schizoaffective disorder, unspecified type (HCC)  Vitamin D deficiency  Other orders -     Vitamin D, Ergocalciferol, (DRISDOL) 1.25 MG (50000 UNIT) CAPS capsule; Take 1 capsule (50,000 Units total) by mouth every 7 (seven) days. -     valsartan-hydrochlorothiazide (DIOVAN HCT) 160-25 MG tablet; Take 1 tablet by mouth daily.    Mammogram will be scheduled and hepatitis C assay obtained  Vitamin D will be refilled

## 2020-01-23 NOTE — Assessment & Plan Note (Signed)
Blood pressure not well controlled plan will be to discontinue hydrochlorothiazide and lisinopril and begin valsartan HCT 160/25 daily she will increase amlodipine to 10 mg daily  I reviewed with the patient a low-salt diet

## 2020-01-24 ENCOUNTER — Telehealth: Payer: Self-pay

## 2020-01-24 LAB — HEPATIC FUNCTION PANEL
ALT: 25 IU/L (ref 0–32)
AST: 18 IU/L (ref 0–40)
Albumin: 5 g/dL — ABNORMAL HIGH (ref 3.8–4.9)
Alkaline Phosphatase: 58 IU/L (ref 44–121)
Bilirubin Total: 0.3 mg/dL (ref 0.0–1.2)
Bilirubin, Direct: 0.11 mg/dL (ref 0.00–0.40)
Total Protein: 7.5 g/dL (ref 6.0–8.5)

## 2020-01-24 LAB — LIPID PANEL
Chol/HDL Ratio: 2.7 ratio (ref 0.0–4.4)
Cholesterol, Total: 159 mg/dL (ref 100–199)
HDL: 58 mg/dL (ref >39)
LDL Chol Calc (NIH): 90 mg/dL (ref 0–99)
Triglycerides: 54 mg/dL (ref 0–149)
VLDL Cholesterol Cal: 11 mg/dL (ref 5–40)

## 2020-01-24 LAB — HCV INTERPRETATION

## 2020-01-24 LAB — HCV AB W REFLEX TO QUANT PCR: HCV Ab: <0.1 s/co ratio (ref 0.0–0.9)

## 2020-01-24 NOTE — Telephone Encounter (Signed)
Contacted pt to go over lab results pt didn't answer lvm  

## 2020-01-25 LAB — FECAL OCCULT BLOOD, IMMUNOCHEMICAL: Fecal Occult Bld: NEGATIVE

## 2020-01-31 ENCOUNTER — Encounter: Payer: Self-pay | Admitting: *Deleted

## 2020-02-20 MED FILL — AMLODIPINE BESYLATE 10 MG T: 10 | 30 days supply | Qty: 30 | Fill #1

## 2020-02-20 MED FILL — VALSARTAN-HCTZ 160-25 MG TA: 160-25 | 30 days supply | Qty: 30 | Fill #1

## 2020-02-20 MED FILL — ?HALOPERIDOL 2 MG TABLET: 2 | 30 days supply | Qty: 30 | Fill #2

## 2020-02-20 MED FILL — ?ATORVASTATIN 20 MG TABLET: 20 | 30 days supply | Qty: 30 | Fill #3

## 2020-02-20 MED FILL — MEGESTROL 20 MG TABLET: 20 | 30 days supply | Qty: 120 | Fill #3

## 2020-03-12 ENCOUNTER — Other Ambulatory Visit: Payer: Self-pay

## 2020-03-12 ENCOUNTER — Other Ambulatory Visit (HOSPITAL_COMMUNITY): Payer: Self-pay | Admitting: Medical Oncology

## 2020-03-12 ENCOUNTER — Ambulatory Visit (HOSPITAL_COMMUNITY)
Admission: EM | Admit: 2020-03-12 | Discharge: 2020-03-12 | Disposition: A | Discharge status: Home / Self Care | Payer: Self-pay | Attending: Medical Oncology | Admitting: Medical Oncology

## 2020-03-12 ENCOUNTER — Encounter (HOSPITAL_COMMUNITY): Payer: Self-pay | Admitting: Emergency Medicine

## 2020-03-12 DIAGNOSIS — R609 Edema, unspecified: Secondary | ICD-10-CM

## 2020-03-12 MED ORDER — AMLODIPINE BESYLATE 5 MG PO TABS: 5.0000 mg | ORAL_TABLET | Freq: Every day | ORAL | 0 refills | Status: DC

## 2020-03-12 MED FILL — AMLODIPINE BESYLATE 5 MG TA: 5 | 20 days supply | Qty: 20 | Fill #0

## 2020-03-12 NOTE — ED Triage Notes (Addendum)
Pt presents with bilateral leg swelling xs 2 weeks. Pt also c/o of knee pain. Denies any fall or injury.

## 2020-03-12 NOTE — ED Provider Notes (Signed)
[facility]    CSN: [id number] Arrival date & time: 03/12/20  1136      History   Chief Complaint Chief Complaint  Patient presents with  . Leg Pain    HPI Cindy Garrison is a 50 y.o. female. Medical Interpretor assisted today  HPI  Leg Pain: Pt reports that she has had bilateral foot swelling for the past 2 weeks. She denies any injury, increased weight, SOB, chest pain, abdominal swelling, or calf pain. She has not tried anything for symptoms. Upon questioning she states that she started amlodipine around the same time. Her next follow up with her PCP is in 2 weeks.   Past Medical History:  Diagnosis Date  . #[id number]   . Acne   . Back pain   . Essential hypertension   . Seasonal allergies   . Syncope 09/16/2014  . Syncope and collapse 09/16/2014    Patient Active Problem List   Diagnosis Date Noted  . Cervical cancer screening 01/23/2020  . Pure hypercholesterolemia 01/23/2020  . Colon cancer screening 01/23/2020  . Vitamin D deficiency 01/23/2020  . Fibroid uterus 03/26/2015  . Schizoaffective disorder ([place]) 09/16/2014  . Essential hypertension   . Allergic rhinitis 09/13/2006    Past Surgical History:  Procedure Laterality Date  . DILITATION & CURRETTAGE/HYSTROSCOPY WITH NOVASURE ABLATION N/A 12/22/2017   Procedure: DILATATION & CURETTAGE/ HYSTEROSCOPY WITH ENDOMETRIAL FIBROID AND  NOVASURE ABLATION;  Surgeon: Lavonia Drafts, MD;  Location: [facility];  Service: Gynecology;  Laterality: N/A;    OB History    Gravida  3   Para  0   Term  0   Preterm  0   AB  3   Living  0     SAB  3   IAB  0   Ectopic  0   Multiple  0   Live Births               Home Medications    Prior to Admission medications   Medication Sig Start Date End Date Taking? Authorizing Provider  amLODipine (NORVASC) 10 MG tablet Take 1 tablet (10 mg total) by mouth daily. 01/23/20   Elsie Stain, MD  atorvastatin (LIPITOR) 20 MG tablet Take 1  tablet (20 mg total) by mouth daily. To lower cholesterol 01/23/20   Elsie Stain, MD  haloperidol (HALDOL) 2 MG tablet Take 1 tablet (2 mg total) by mouth at bedtime. 12/04/19   Nev[place] Crane, MD  valsartan-hydrochlorothiazide (DIOVAN HCT) 160-25 MG tablet Take 1 tablet by mouth daily. 01/23/20   Elsie Stain, MD  Vitamin D, Ergocalciferol, (DRISDOL) 1.25 MG (50000 UNIT) CAPS capsule Take 1 capsule (50,000 Units total) by mouth every 7 (seven) days. 01/23/20   Elsie Stain, MD  omeprazole (PRILOSEC) 40 MG capsule Take 1 capsule (40 mg total) by mouth 2 (two) times daily before a meal. 12/28/18 12/29/18  Antony Blackbird, MD    Family History History reviewed. No pertinent family history.  Social History Social History   Tobacco Use  . Smoking status: Never Smoker  . Smokeless tobacco: Never Used  Vaping Use  . Vaping Use: Never used  Substance Use Topics  . Alcohol use: Not Currently    Comment: occ.  . Drug use: No     Allergies   Other   Review of Systems Review of Systems  As stated above in HPI Physical Exam Triage Vital Signs ED Triage Vitals  Enc Vitals Group  BP 03/12/20 1203 127/84     Pulse Rate 03/12/20 1203 (!) 101     Resp 03/12/20 1203 18     Temp 03/12/20 1203 98.8 F (37.1 C)     Temp Source 03/12/20 1203 Oral     SpO2 03/12/20 1203 97 %     Weight --      Height --      Head Circumference --      Peak Flow --      Pain Score 03/12/20 1202 2     Pain Loc --      Pain Edu? --      Excl. in [place]? --    No data found.  Updated Vital Signs BP 127/84 (BP Location: Right Arm)   Pulse (!) 101   Temp 98.8 F (37.1 C) (Oral)   Resp 18   LMP 09/12/2017 (LMP Unknown)   SpO2 97%   Physical Exam Vitals and nursing note reviewed.  Constitutional:      General: She is not in acute distress.    Appearance: Normal appearance. She is not ill-appearing, toxic-appearing or diaphoretic.  HENT:     Mouth/Throat:     Mouth: Mucous membranes are  moist.  Eyes:     Comments: NO evidence of pallor  Cardiovascular:     Rate and Rhythm: Normal rate and regular rhythm.     Pulses: Normal pulses.     Heart sounds: Normal heart sounds.     Comments: Moderate non pitting edema of bilateral feet extending to ankles. No erythema, ecchymosis, increased warmth, skin breakdown Pulmonary:     Effort: Pulmonary effort is normal.     Breath sounds: Normal breath sounds.  Abdominal:     General: There is no distension.     Palpations: Abdomen is soft.  Skin:    General: Skin is warm and dry.  Neurological:     General: No focal deficit present.     Mental Status: She is alert. Mental status is at baseline.     Sensory: No sensory deficit.      UC Treatments / Results  Labs (all labs ordered are listed, but only abnormal results are displayed) Labs Reviewed - No data to display  EKG   Radiology No results found.  Procedures Procedures (including critical care time)  Medications Ordered in UC Medications - No data to display  Initial Impression / Assessment and Plan / UC Course  I have reviewed the triage vital signs and the nursing notes.  Pertinent labs & imaging results that were available during my care of the patient were reviewed by me and considered in my medical decision making (see chart for details).     New. I discussed with patient that this appears to be a fairly common side effect from the amlodipine. Will decrease her amlodipine from 10 mg to 5 mg. She will need to monitor her BP and follow up with her PCP at her normally scheduled time or sooner as needed.  Final Clinical Impressions(s) / UC Diagnoses   Final diagnoses:  None   Discharge Instructions   None    ED Prescriptions    None     PDMP not reviewed this encounter.   Hughie Closs, [place] 03/12/20 1318

## 2020-03-12 NOTE — Discharge Instructions (Addendum)
Reduce your Amlodipine from 10 mg to 5 mg once daily

## 2020-03-23 NOTE — Progress Notes (Signed)
Subjective:    Patient ID: Cindy Garrison, female    DOB: 1970/08/21, 50 y.o.   MRN: [id number]  49 y.o.F former [facility] pt.  Here to reest PCP Hx of HTN Schizophrenia, Iron def anemia  Last seen 09/2019 by Dr Margarita Rana  This patient has a history of hyperlipidemia hypertension schizoaffective disorder and here for follow-up chronic disease management.  Her mental health is managed by psychiatrist at the behavioral [facility] and she is maintaining her haloperidol without difficulty.  Patient states she also maintains her blood pressure medicine but note on arrival blood pressure is elevated 145/87.  She states her right shoulder pain she had previously has now resolved.  Patient does take her cholesterol medication is due for repeat lipid panel and is fasting today. Note this patient had her Covid vaccine [employer] in May 2021 and she knows she needs a booster vaccine at this time  There are no new symptom complaints  The patient is deferring a flu vaccine at this time the patient does need a mammogram and colon cancer screening and Pap smear screening  The patient does see gynecology for her uterine fibroids and will need a Pap smear and a mammogram was ordered but she is yet to have this scheduled  03/24/20 this visit was assisted by [place] interpreters for [place] Since the last office visit the patient went to the urgent care just over the past week and because of edema in the lower extremities her amlodipine dose was reduced to 5 mg a day she states in the interim she has stayed on the 5 mg daily dose and has been taking the valsartan and cutting it in half of 160/25 tablet on arrival edema has resolved and blood pressure is 115/74  Patient continues haloperidol per psychiatry.  Patient also is taking the atorvastatin.  The patient is yet to have her gynecology visit.  Patient does take vitamin D weekly 50,000 units.  There are no other complaints   Past  Medical History:  Diagnosis Date   #[id number]    Acne    Back pain    Essential hypertension    Seasonal allergies    Syncope 09/16/2014   Syncope and collapse 09/16/2014     No family history on file.   Social History   Socioeconomic History   Marital status: Married    Spouse name: Not on file   Number of children: Not on file   Years of education: Not on file   Highest education level: Not on file  Occupational History   Not on file  Tobacco Use   Smoking status: Never Smoker   Smokeless tobacco: Never Used  Vaping Use   Vaping Use: Never used  Substance and Sexual Activity   Alcohol use: Not Currently    Comment: occ.   Drug use: No   Sexual activity: Not on file    Comment: monogamous female partner for 5 years (2010)  Other Topics Concern   Not on file  Social History Narrative   Not on file   Social Determinants of Health   Financial Resource Strain: Not on file  Food Insecurity: Not on file  Transportation Needs: Not on file  Physical Activity: Not on file  Stress: Not on file  Social Connections: Not on file  Intimate Partner Violence: Not on file     Allergies  Allergen Reactions   Other Rash    Blood pressure pill (unknown name)  Outpatient Medications Prior to Visit  Medication Sig Dispense Refill   atorvastatin (LIPITOR) 20 MG tablet Take 1 tablet (20 mg total) by mouth daily. To lower cholesterol 90 tablet 2   haloperidol (HALDOL) 2 MG tablet Take 1 tablet (2 mg total) by mouth at bedtime. 30 tablet 3   Vitamin D, Ergocalciferol, (DRISDOL) 1.25 MG (50000 UNIT) CAPS capsule Take 1 capsule (50,000 Units total) by mouth every 7 (seven) days. 16 capsule 1   amLODipine (NORVASC) 5 MG tablet Take 1 tablet (5 mg total) by mouth daily. 20 tablet 0   valsartan-hydrochlorothiazide (DIOVAN HCT) 160-25 MG tablet Take 1 tablet by mouth daily. 90 tablet 1   megestrol (MEGACE) 20 MG tablet Take 40 mg by mouth 2 (two) times daily.      No facility-administered medications prior to visit.     Review of Systems  Constitutional: Negative.   HENT: Negative.   Eyes: Negative.   Respiratory: Negative.   Gastrointestinal: Negative.   Endocrine: Negative.   Genitourinary: Negative.   Musculoskeletal: Negative.        Objective:   Physical Exam Vitals:   03/24/20 0909  BP: 115/74  Pulse: 98  SpO2: 98%  Weight: 160 lb (72.6 kg)  Height: 5\' 1"  (1.549 m)    Gen: Pleasant, well-nourished, in no distress,  normal affect  ENT: No lesions,  mouth clear,  oropharynx clear, no postnasal drip  Neck: No JVD, no TMG, no carotid bruits  Lungs: No use of accessory muscles, no dullness to percussion, clear without rales or rhonchi  Cardiovascular: RRR, heart sounds normal, no murmur or gallops, no peripheral edema  Abdomen: soft and NT, no HSM,  BS normal  Musculoskeletal: No deformities, no cyanosis or clubbing  Neuro: alert, non focal  Skin: Warm, no lesions or rashes        Assessment & Plan:  I personally reviewed all images and lab data in the [facility] system as well as any outside material available during this office visit and agree with the  radiology impressions.   Essential hypertension Blood pressure under good control will reduce valsartan HCT to 80/12.5 daily and continue 5 mg daily amlodipine  Schizoaffective disorder (HCC) Continue mental health follow-up  Vitamin D deficiency Continue weekly vitamin D  Pure hypercholesterolemia Continue atorvastatin daily   Senita was seen today for hypertension.  Diagnoses and all orders for this visit:  Essential hypertension  Schizoaffective disorder, unspecified type ([place])  Vitamin D deficiency  Pure hypercholesterolemia  Other orders -     valsartan-hydrochlorothiazide (DIOVAN HCT) 80-12.5 MG tablet; Take 1 tablet by mouth daily. -     amLODipine (NORVASC) 5 MG tablet; Take 1 tablet (5 mg total) by mouth daily.   Patient agrees to  obtaining Covid vaccine booster was given a location where she can achieve this

## 2020-03-24 ENCOUNTER — Encounter: Payer: Self-pay | Admitting: Critical Care Medicine

## 2020-03-24 ENCOUNTER — Other Ambulatory Visit: Payer: Self-pay | Admitting: Critical Care Medicine

## 2020-03-24 ENCOUNTER — Other Ambulatory Visit: Payer: Self-pay

## 2020-03-24 ENCOUNTER — Ambulatory Visit: Payer: Self-pay | Attending: Critical Care Medicine | Admitting: Critical Care Medicine

## 2020-03-24 VITALS — BP 115/74 | HR 98 | Ht 61.0 in | Wt 160.0 lb

## 2020-03-24 DIAGNOSIS — E7800 Pure hypercholesterolemia, unspecified: Secondary | ICD-10-CM

## 2020-03-24 DIAGNOSIS — E559 Vitamin D deficiency, unspecified: Secondary | ICD-10-CM

## 2020-03-24 DIAGNOSIS — I10 Essential (primary) hypertension: Secondary | ICD-10-CM

## 2020-03-24 DIAGNOSIS — F259 Schizoaffective disorder, unspecified: Secondary | ICD-10-CM

## 2020-03-24 MED ORDER — VALSARTAN-HYDROCHLOROTHIAZIDE 80-12.5 MG PO TABS: 1.0000 | ORAL_TABLET | Freq: Every day | ORAL | 2 refills | Status: DC

## 2020-03-24 MED ORDER — AMLODIPINE BESYLATE 5 MG PO TABS: 5.0000 mg | ORAL_TABLET | Freq: Every day | ORAL | 1 refills | Status: DC

## 2020-03-24 MED FILL — ?HALOPERIDOL 2 MG TABLET: 2 | 30 days supply | Qty: 30 | Fill #0

## 2020-03-24 MED FILL — VALSARTAN-HCTZ 80-12.5 MG T: 80-12.5 | 30 days supply | Qty: 30 | Fill #0

## 2020-03-24 MED FILL — ?ATORVASTATIN 20 MG TABLET: 20 | 30 days supply | Qty: 30 | Fill #4

## 2020-03-24 NOTE — Progress Notes (Signed)
Went to Ed for leg swelling and they lowered Amlodipine to 5mg . Taking 1/2 tables of Valsartan_HCTZ.

## 2020-03-24 NOTE — Assessment & Plan Note (Signed)
Continue atorvastatin daily

## 2020-03-24 NOTE — Assessment & Plan Note (Signed)
Continue mental health follow-up

## 2020-03-24 NOTE — Assessment & Plan Note (Signed)
Blood pressure under good control will reduce valsartan HCT to 80/12.5 daily and continue 5 mg daily amlodipine

## 2020-03-24 NOTE — Patient Instructions (Signed)
Change valsartan HCT to a 80 mg / 12.51 tablet daily refills sent to our pharmacy  Stay on amlodipine 5 mg daily  No other medication changes  A mammogram has been ordered please follow through on obtaining this  Go to your local pharmacy and obtain a Pfizer booster shot for COVID vaccination  Return to see Dr. Joya Gaskins 3 months  Keep your gynecology appointment upcoming  Remplacez valsartan HCT par des recharges quotidiennes de 80 mg / 12,51 comprims envoyes  notre pharmacie  Restez sous amlodipine 5 mg par jour  Aucun autre changement de mdicament  Une mammographie a t commande, Psychologist, counselling suivre pour l'obtenir  Rendez-vous  votre pharmacie locale et obtenez un rappel Pfizer pour la vaccination COVID  Retourner voir le Dr Joya Gaskins 3 mois  Gardez votre rendez-vous de gyncologie  venir

## 2020-03-24 NOTE — Assessment & Plan Note (Signed)
Continue weekly vitamin-D.

## 2020-04-01 ENCOUNTER — Ambulatory Visit (INDEPENDENT_AMBULATORY_CARE_PROVIDER_SITE_OTHER): Payer: No Payment, Other | Admitting: Psychiatry

## 2020-04-01 ENCOUNTER — Encounter (HOSPITAL_COMMUNITY): Payer: Self-pay | Admitting: Psychiatry

## 2020-04-01 ENCOUNTER — Other Ambulatory Visit (HOSPITAL_COMMUNITY): Payer: Self-pay | Admitting: Psychiatry

## 2020-04-01 ENCOUNTER — Other Ambulatory Visit: Payer: Self-pay

## 2020-04-01 VITALS — BP 134/87 | HR 87 | Ht 61.0 in | Wt 157.0 lb

## 2020-04-01 DIAGNOSIS — F259 Schizoaffective disorder, unspecified: Secondary | ICD-10-CM | POA: Diagnosis not present

## 2020-04-01 MED ORDER — HALOPERIDOL 2 MG PO TABS: 2.0000 mg | ORAL_TABLET | Freq: Every day | ORAL | 3 refills | Status: DC

## 2020-04-01 NOTE — Progress Notes (Signed)
[facility] Progress Note    Patient Identification: Cindy Garrison MRN:  [id number] Date of Evaluation:  04/01/2020   Patient was seen with the help of [place] interpreter via [employer].  Chief Complaint:  "I am doing good."   Visit Diagnosis:    ICD-10-CM   1. Schizoaffective disorder, unspecified type ([place])  F25.9 haloperidol (HALDOL) 2 MG tablet    History of Present Illness: Patient reported she is doing well.  She informed that her mood is stable.  She denied any auditory or visual hallucinations.  She denied any paranoid delusions.  She reported that she is sleeping well at night. She informed that her family is doing well. 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 When the writer asked her if she has any other questions or concerns for today, she asked through the interpreter, " Can I get a raise in my salary?" Writer informed her that unfortunately Probation officer had no control over that and that she will have to discuss this issue with her supervisor. Patient was agreeable to this suggestion She denied any other concerns or issues today.   Past Psychiatric History: Schizoaffective disorder, Has been on Haldol for 10 years.  Was being seen at [facility] in the past.  Past Medical History:  Past Medical History:  Diagnosis Date  . #[id number]   . Acne   . Back pain   . Essential hypertension   . Seasonal allergies   . Syncope 09/16/2014  . Syncope and collapse 09/16/2014    Past Surgical History:  Procedure Laterality Date  . DILITATION & CURRETTAGE/HYSTROSCOPY WITH NOVASURE ABLATION N/A 12/22/2017   Procedure: DILATATION & CURETTAGE/ HYSTEROSCOPY WITH ENDOMETRIAL FIBROID AND  NOVASURE ABLATION;  Surgeon: Lavonia Drafts, MD;  Location: [facility];  Service: Gynecology;  Laterality: N/A;    Family Psychiatric History: denied  Family History: No family history on file.  Social History:   Social History   Socioeconomic History   . Marital status: Married    Spouse name: Not on file  . Number of children: Not on file  . Years of education: Not on file  . Highest education level: Not on file  Occupational History  . Not on file  Tobacco Use  . Smoking status: Never Smoker  . Smokeless tobacco: Never Used  Vaping Use  . Vaping Use: Never used  Substance and Sexual Activity  . Alcohol use: Not Currently    Comment: occ.  . Drug use: No  . Sexual activity: Not on file    Comment: monogamous female partner for 5 years (2010)  Other Topics Concern  . Not on file  Social History Narrative  . Not on file   Social Determinants of Health   Financial Resource Strain: Not on file  Food Insecurity: Not on file  Transportation Needs: Not on file  Physical Activity: Not on file  Stress: Not on file  Social Connections: Not on file    Additional S Allergies:   Allergies  Allergen Reactions  . Other Rash    Blood pressure pill (unknown name)    Metabolic Disorder Labs: Lab Results  Component Value Date   HGBA1C 5.5 01/24/2018   No results found for: PROLACTIN Lab Results  Component Value Date   CHOL 159 01/23/2020   TRIG 54 01/23/2020   HDL 58 01/23/2020   CHOLHDL 2.7 01/23/2020   LDLCALC 90 01/23/2020   LDLCALC 160 (H) 11/14/2017   Lab Results  Component Value Date   TSH 1.260 06/20/2019    Therapeutic Level Labs: No results found for: LITHIUM No results found for: CBMZ No results found for: VALPROATE  Current Medications: Current Outpatient Medications  Medication Sig Dispense Refill  . amLODipine (NORVASC) 5 MG tablet Take 1 tablet (5 mg total) by mouth daily. 90 tablet 1  . atorvastatin (LIPITOR) 20 MG tablet Take 1 tablet (20 mg total) by mouth daily. To lower cholesterol 90 tablet 2  . valsartan-hydrochlorothiazide (DIOVAN HCT) 80-12.5 MG tablet Take 1 tablet by mouth daily. 90 tablet 2  . Vitamin D, Ergocalciferol, (DRISDOL) 1.25 MG (50000 UNIT) CAPS capsule Take 1 capsule (50,000  Units total) by mouth every 7 (seven) days. 16 capsule 1  . haloperidol (HALDOL) 2 MG tablet Take 1 tablet (2 mg total) by mouth at bedtime. 30 tablet 3   No current facility-administered medications for this visit.    Musculoskeletal: Strength & Muscle Tone: within normal limits Gait & Station: normal Patient leans: N/A  Psychiatric Specialty Exam: Review of Systems      Blood pressure 134/87, pulse 87, height 5\' 1"  (1.549 m), weight 157 lb (71.2 kg), last menstrual period 09/12/2017, SpO2 100 %.Body mass index is 29.66 kg/m.  General Appearance: Well Groomed  Eye Contact:  Good  Speech:  Clear and Coherent and Normal Rate  Volume:  Normal  Mood:  Euthymic  Affect:  Congruent  Thought Process:  Goal Directed and Descriptions of Associations: Intact  Orientation:  Full (Time, Place, and Person)  Thought Content:  Logical  Suicidal Thoughts:  No  Homicidal Thoughts:  No  Memory:  Immediate;   Good Recent;   Good  Judgement:  Fair  Insight:  Fair  Psychomotor Activity:  Normal  Concentration:  Concentration: Good and Attention Span: Good  Recall:  Good  Fund of Knowledge:Good  Language: Good  Akathisia:  Negative  Handed:  Right  AIMS (if indicated):  0  Assets:  Communication Skills Desire for Improvement Financial Resources/Insurance Housing  ADL's:  Intact  Cognition: WNL  Sleep:  Good   Screenings: [place] Office Visit from 04/01/2020 in [place] Total Score [place] Office Visit from 01/23/2020 in [place] Office Visit from 12/17/2019 in [place] [facility] for [place] at [facility] Office Visit from 10/18/2019 in [place] for [employer] at [facility] for Women Office Visit from 03/16/2019 in [place] Visit from 03/01/2019 in [place]  Total GAD-7 Score 0 1 3  0 0    PHQ2-9   [place] Office Visit from 04/01/2020 in [facility] Office Visit from 01/23/2020 in [place] Visit from 12/17/2019 in [place] for [place] at [facility] for Women Office Visit from 10/18/2019 in [place] for [employer] at [facility] for Women Office Visit from 03/16/2019 in [place]  PHQ-2 Total Score 0 0 0 4 0  PHQ-9 Total Score -- -- 0 10 --    [place] Office Visit from 04/01/2020 in [facility] ED from 03/12/2020 in [facility] [place] Error: Q3, 4, or 5 should not be populated when Q2 is No Error: Question 6 not populated  Assessment and Plan: Patient is stable on her current regimen. AIMS score: 0.  1. Schizoaffective disorder, unspecified type (H[facility])  - Continue haloperidol (HALDOL) 2 MG tablet; Take 1 tablet (2 mg total) by mouth at bedtime.  Dispense: 30 tablet; Refill: 2   Continue same medication regimen. Follow up in 4 months.   [place] Crane, MD 3/15/20229:12 AM

## 2020-04-09 ENCOUNTER — Other Ambulatory Visit (HOSPITAL_COMMUNITY)
Admission: RE | Admit: 2020-04-09 | Discharge: 2020-04-09 | Disposition: A | Discharge status: Home / Self Care | Payer: Self-pay | Source: Ambulatory Visit | Attending: Obstetrics and Gynecology | Admitting: Obstetrics and Gynecology

## 2020-04-09 ENCOUNTER — Encounter: Payer: Self-pay | Admitting: Obstetrics and Gynecology

## 2020-04-09 ENCOUNTER — Ambulatory Visit (INDEPENDENT_AMBULATORY_CARE_PROVIDER_SITE_OTHER): Payer: Self-pay | Admitting: Obstetrics and Gynecology

## 2020-04-09 ENCOUNTER — Other Ambulatory Visit: Payer: Self-pay

## 2020-04-09 VITALS — BP 132/92 | HR 97 | Ht 61.0 in | Wt 158.2 lb

## 2020-04-09 DIAGNOSIS — Z9889 Other specified postprocedural states: Secondary | ICD-10-CM | POA: Insufficient documentation

## 2020-04-09 DIAGNOSIS — Z01411 Encounter for gynecological examination (general) (routine) with abnormal findings: Secondary | ICD-10-CM | POA: Insufficient documentation

## 2020-04-09 DIAGNOSIS — Z01419 Encounter for gynecological examination (general) (routine) without abnormal findings: Secondary | ICD-10-CM | POA: Insufficient documentation

## 2020-04-09 DIAGNOSIS — Z1231 Encounter for screening mammogram for malignant neoplasm of breast: Secondary | ICD-10-CM

## 2020-04-09 NOTE — Addendum Note (Signed)
Addended by: Bethanne Ginger on: 04/09/2020 11:10 AM   Modules accepted: Orders

## 2020-04-09 NOTE — Progress Notes (Signed)
Cindy Garrison is a 50 y.o. G41P0030 female here for a routine annual gynecologic exam.   Denies abnormal vaginal bleeding, discharge, pelvic pain, problems with intercourse or other gynecologic concerns.    Gynecologic History Patient's last menstrual period was 09/12/2017 (lmp unknown). Contraception: post menopausal status Last Pap: 03/2016. Results were: normal Last mammogram: unknown  Obstetric History OB History  Gravida Para Term Preterm AB Living  3 0 0 0 3 0  SAB IAB Ectopic Multiple Live Births  3 0 0 0      # Outcome Date GA Lbr Len/2nd Weight Sex Delivery Anes PTL Lv  3 SAB           2 SAB           1 SAB             Past Medical History:  Diagnosis Date  . #[id number]   . Acne   . Back pain   . Essential hypertension   . Seasonal allergies   . Syncope 09/16/2014  . Syncope and collapse 09/16/2014    Past Surgical History:  Procedure Laterality Date  . DILITATION & CURRETTAGE/HYSTROSCOPY WITH NOVASURE ABLATION N/A 12/22/2017   Procedure: DILATATION & CURETTAGE/ HYSTEROSCOPY WITH ENDOMETRIAL FIBROID AND  NOVASURE ABLATION;  Surgeon: Lavonia Drafts, MD;  Location: [facility];  Service: Gynecology;  Laterality: N/A;    Current Outpatient Medications on File Prior to Visit  Medication Sig Dispense Refill  . amLODipine (NORVASC) 5 MG tablet Take 1 tablet (5 mg total) by mouth daily. 90 tablet 1  . atorvastatin (LIPITOR) 20 MG tablet Take 1 tablet (20 mg total) by mouth daily. To lower cholesterol 90 tablet 2  . haloperidol (HALDOL) 2 MG tablet Take 1 tablet (2 mg total) by mouth at bedtime. 30 tablet 3  . valsartan-hydrochlorothiazide (DIOVAN HCT) 80-12.5 MG tablet Take 1 tablet by mouth daily. 90 tablet 2  . Vitamin D, Ergocalciferol, (DRISDOL) 1.25 MG (50000 UNIT) CAPS capsule Take 1 capsule (50,000 Units total) by mouth every 7 (seven) days. 16 capsule 1  . [DISCONTINUED] omeprazole (PRILOSEC) 40 MG capsule Take 1 capsule (40 mg total) by mouth 2 (two) times  daily before a meal. 60 capsule 2   No current facility-administered medications on file prior to visit.    Allergies  Allergen Reactions  . Other Rash    Blood pressure pill (unknown name)    Social History   Socioeconomic History  . Marital status: Married    Spouse name: Not on file  . Number of children: Not on file  . Years of education: Not on file  . Highest education level: Not on file  Occupational History  . Not on file  Tobacco Use  . Smoking status: Never Smoker  . Smokeless tobacco: Never Used  Vaping Use  . Vaping Use: Never used  Substance and Sexual Activity  . Alcohol use: Not Currently    Comment: occ.  . Drug use: No  . Sexual activity: Not on file    Comment: monogamous female partner for 5 years (2010)  Other Topics Concern  . Not on file  Social History Narrative  . Not on file   Social Determinants of Health   Financial Resource Strain: Not on file  Food Insecurity: Not on file  Transportation Needs: Not on file  Physical Activity: Not on file  Stress: Not on file  Social Connections: Not on file  Intimate Partner Violence: Not on file    History  reviewed. No pertinent family history.  The following portions of the patient's history were reviewed and updated as appropriate: allergies, current medications, past family history, past medical history, past social history, past surgical history and problem list.  Review of Systems Pertinent items are noted in HPI.   Objective:  BP (!) 132/92   Pulse 97   Ht 5\' 1"  (1.549 m)   Wt 158 lb 3.2 oz (71.8 kg)   LMP 09/12/2017 (LMP Unknown)   BMI 29.89 kg/m  CONSTITUTIONAL: Well-developed, well-nourished female in no acute distress.  HENT:  Normocephalic, atraumatic, External right and left ear normal. Oropharynx is clear and moist EYES: Conjunctivae and EOM are normal. Pupils are equal, round, and reactive to light. No scleral icterus.  NECK: Normal range of motion, supple, no masses.  Normal  thyroid.  SKIN: Skin is warm and dry. No rash noted. Not diaphoretic. No erythema. No pallor. [place]: Alert and oriented to person, place, and time. Normal reflexes, muscle tone coordination. No cranial nerve deficit noted. PSYCHIATRIC: Normal mood and affect. Normal behavior. Normal judgment and thought content. CARDIOVASCULAR: Normal heart rate noted, regular rhythm RESPIRATORY: Clear to auscultation bilaterally. Effort and breath sounds normal, no problems with respiration noted. BREASTS: Symmetric in size. No masses, skin changes, nipple drainage, or lymphadenopathy. ABDOMEN: Soft, normal bowel sounds, no distention noted.  No tenderness, rebound or guarding.  PELVIC: Normal appearing external genitalia; normal appearing vaginal mucosa and cervix.  No abnormal discharge noted.  Pap smear obtained.  Normal uterine size, no other palpable masses, no uterine or adnexal tenderness. MUSCULOSKELETAL: Normal range of motion. No tenderness.  No cyanosis, clubbing, or edema.  2+ distal pulses.   Assessment:  Annual gynecologic examination with pap smear   Plan:  Will follow up results of pap smear and manage accordingly. Mammogram scheduled Routine preventative health maintenance measures emphasized. Please refer to After Visit Summary for other counseling recommendations.    Chancy Milroy, MD, [place] Attending [place] for [facility], [place]

## 2020-04-09 NOTE — Patient Instructions (Signed)
Health Maintenance, Female Adopting a healthy lifestyle and getting preventive care are important in promoting health and wellness. Ask your health care provider about:  The right schedule for you to have regular tests and exams.  Things you can do on your own to prevent diseases and keep yourself healthy. What should I know about diet, weight, and exercise? Eat a healthy diet  Eat a diet that includes plenty of vegetables, fruits, low-fat dairy products, and lean protein.  Do not eat a lot of foods that are high in solid fats, added sugars, or sodium.   Maintain a healthy weight Body mass index (BMI) is used to identify weight problems. It estimates body fat based on height and weight. Your health care provider can help determine your BMI and help you achieve or maintain a healthy weight. Get regular exercise Get regular exercise. This is one of the most important things you can do for your health. Most adults should:  Exercise for at least 150 minutes each week. The exercise should increase your heart rate and make you sweat (moderate-intensity exercise).  Do strengthening exercises at least twice a week. This is in addition to the moderate-intensity exercise.  Spend less time sitting. Even light physical activity can be beneficial. Watch cholesterol and blood lipids Have your blood tested for lipids and cholesterol at 52 years of age, then have this test every 5 years. Have your cholesterol levels checked more often if:  Your lipid or cholesterol levels are high.  You are older than 52 years of age.  You are at high risk for heart disease. What should I know about cancer screening? Depending on your health history and family history, you may need to have cancer screening at various ages. This may include screening for:  Breast cancer.  Cervical cancer.  Colorectal cancer.  Skin cancer.  Lung cancer. What should I know about heart disease, diabetes, and high blood  pressure? Blood pressure and heart disease  High blood pressure causes heart disease and increases the risk of stroke. This is more likely to develop in people who have high blood pressure readings, are of African descent, or are overweight.  Have your blood pressure checked: ? Every 3-5 years if you are 18-39 years of age. ? Every year if you are 40 years old or older. Diabetes Have regular diabetes screenings. This checks your fasting blood sugar level. Have the screening done:  Once every three years after age 40 if you are at a normal weight and have a low risk for diabetes.  More often and at a younger age if you are overweight or have a high risk for diabetes. What should I know about preventing infection? Hepatitis B If you have a higher risk for hepatitis B, you should be screened for this virus. Talk with your health care provider to find out if you are at risk for hepatitis B infection. Hepatitis C Testing is recommended for:  Everyone born from 1945 through 1965.  Anyone with known risk factors for hepatitis C. Sexually transmitted infections (STIs)  Get screened for STIs, including gonorrhea and chlamydia, if: ? You are sexually active and are younger than 52 years of age. ? You are older than 52 years of age and your health care provider tells you that you are at risk for this type of infection. ? Your sexual activity has changed since you were last screened, and you are at increased risk for chlamydia or gonorrhea. Ask your health care provider   if you are at risk.  Ask your health care provider about whether you are at high risk for HIV. Your health care provider may recommend a prescription medicine to help prevent HIV infection. If you choose to take medicine to prevent HIV, you should first get tested for HIV. You should then be tested every 3 months for as long as you are taking the medicine. Pregnancy  If you are about to stop having your period (premenopausal) and  you may become pregnant, seek counseling before you get pregnant.  Take 400 to 800 micrograms (mcg) of folic acid every day if you become pregnant.  Ask for birth control (contraception) if you want to prevent pregnancy. Osteoporosis and menopause Osteoporosis is a disease in which the bones lose minerals and strength with aging. This can result in bone fractures. If you are 65 years old or older, or if you are at risk for osteoporosis and fractures, ask your health care provider if you should:  Be screened for bone loss.  Take a calcium or vitamin D supplement to lower your risk of fractures.  Be given hormone replacement therapy (HRT) to treat symptoms of menopause. Follow these instructions at home: Lifestyle  Do not use any products that contain nicotine or tobacco, such as cigarettes, e-cigarettes, and chewing tobacco. If you need help quitting, ask your health care provider.  Do not use street drugs.  Do not share needles.  Ask your health care provider for help if you need support or information about quitting drugs. Alcohol use  Do not drink alcohol if: ? Your health care provider tells you not to drink. ? You are pregnant, may be pregnant, or are planning to become pregnant.  If you drink alcohol: ? Limit how much you use to 0-1 drink a day. ? Limit intake if you are breastfeeding.  Be aware of how much alcohol is in your drink. In the [place] one drink equals one 12 oz bottle of beer (355 mL), one 5 oz glass of wine (148 mL), or one 1 oz glass of hard liquor (44 mL). General instructions  Schedule regular health, dental, and eye exams.  Stay current with your vaccines.  Tell your health care provider if: ? You often feel depressed. ? You have ever been abused or do not feel safe at home. Summary  Adopting a healthy lifestyle and getting preventive care are important in promoting health and wellness.  Follow your health care provider's instructions about healthy  diet, exercising, and getting tested or screened for diseases.  Follow your health care provider's instructions on monitoring your cholesterol and blood pressure. This information is not intended to replace advice given to you by your health care provider. Make sure you discuss any questions you have with your health care provider. Document Revised: 12/28/2017 Document Reviewed: 12/28/2017 Elsevier Patient Education  2021 Elsevier Inc.  

## 2020-04-10 LAB — CYTOLOGY - PAP
Comment: NEGATIVE
Diagnosis: NEGATIVE
High risk HPV: NEGATIVE

## 2020-04-11 MED FILL — AMLODIPINE BESYLATE 5 MG TA: 5 | 30 days supply | Qty: 30 | Fill #0

## 2020-04-11 MED FILL — VIT D2 1.25 MG (50,000 UNIT: 1.25 MG | 84 days supply | Qty: 12 | Fill #1

## 2020-04-14 ENCOUNTER — Telehealth: Payer: Self-pay

## 2020-04-14 NOTE — Telephone Encounter (Signed)
-----   Message from Chancy Milroy, MD sent at 04/12/2020  9:19 AM EDT ----- Please let [name] know that her pap smear was normal.  Thanks Legrand Como

## 2020-04-14 NOTE — Telephone Encounter (Signed)
Called Pt using [place] id# [id number] to go over Pap results, no answer left VM for call back.

## 2020-04-19 ENCOUNTER — Other Ambulatory Visit: Payer: Self-pay

## 2020-04-22 NOTE — Telephone Encounter (Signed)
Pt arrived at [facility] today for pap results and a Mammogram appt. Pt given results of pap smear results per Dr Rip Harbour and pt walked to [facility] unit to schedule mammogram today. Pt agreeable and verbalized understanding.  Encounter closed  [place], RN  04/22/20.

## 2020-04-24 ENCOUNTER — Other Ambulatory Visit: Payer: Self-pay

## 2020-04-24 MED FILL — Haloperidol Tab 2 MG: ORAL | 30 days supply | Qty: 30 | Fill #0 | Status: AC

## 2020-04-24 MED FILL — Atorvastatin Calcium Tab 20 MG (Base Equivalent): ORAL | 30 days supply | Qty: 30 | Fill #0 | Status: AC

## 2020-04-25 ENCOUNTER — Other Ambulatory Visit: Payer: Self-pay

## 2020-05-04 IMAGING — US TRANSVAGINAL ULTRASOUND OF PELVIS
1 series · 15 of 25 positions shown · non-contrast
Comparison: 03/26/2015

CLINICAL DATA: Pelvic pain

EXAM:
ULTRASOUND PELVIS TRANSVAGINAL
TECHNIQUE: Transvaginal ultrasound examination of the pelvis was performed
including evaluation of the uterus, ovaries, adnexal regions, and
pelvic cul-de-sac.

[Series 1: transvaginal ultrasound of pelvis · 79 acquisitions, 15 frames shown]
[im 1/79]
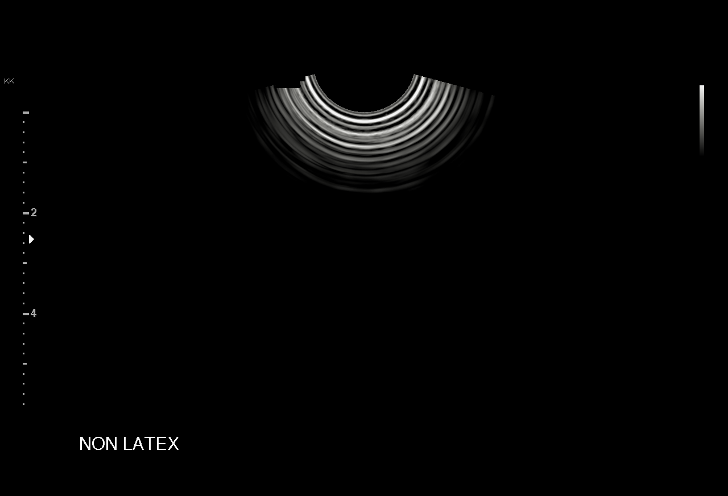
[im 7/79]
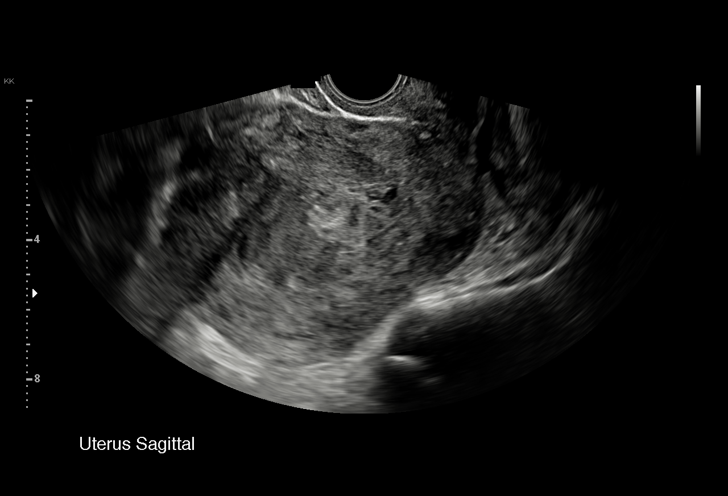
[im 14/79]
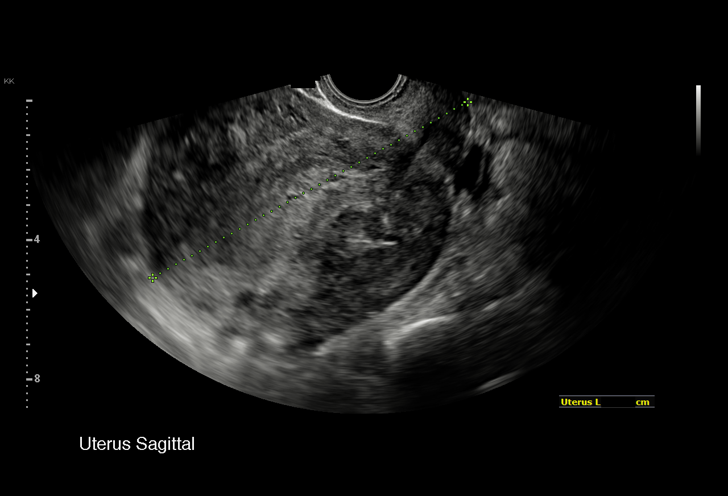
[im 17/79]
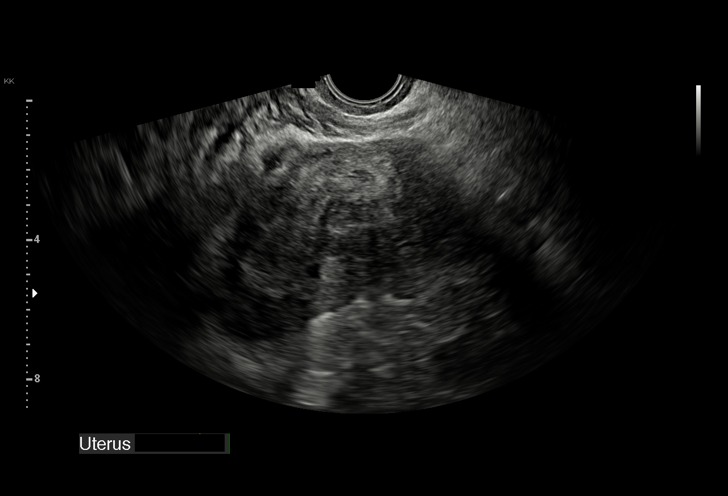
[im 23/79]
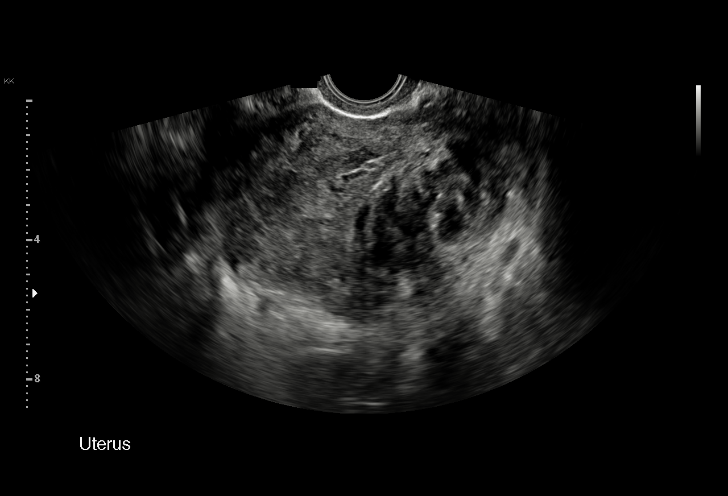
[im 30/79]
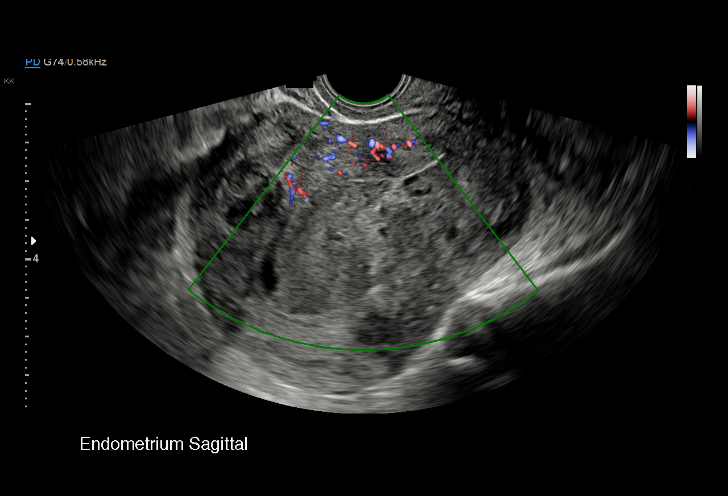
[im 33/79]
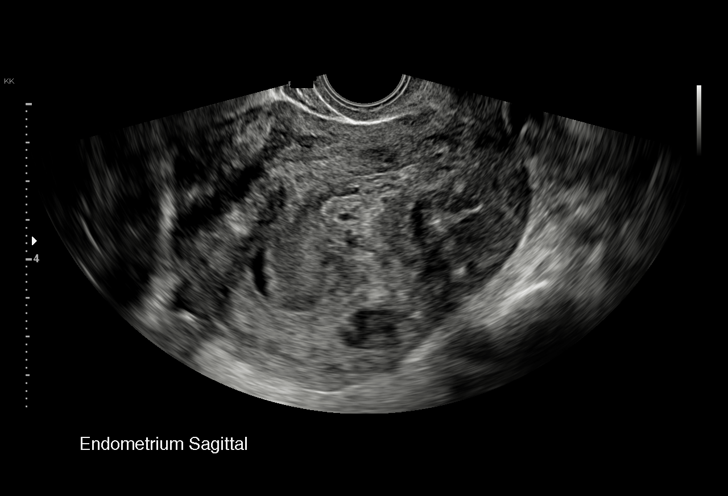
[im 40/79]
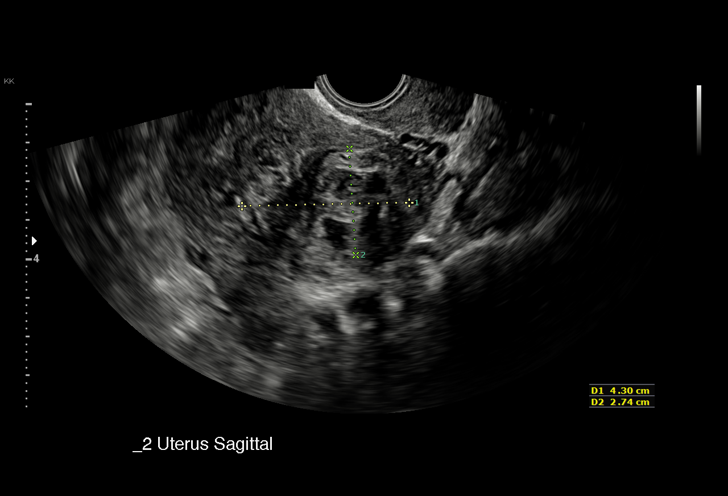
[im 46/79]
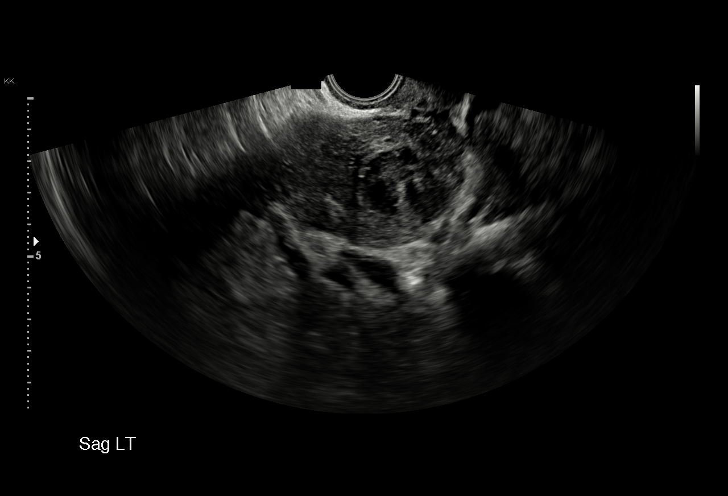
[im 49/79]
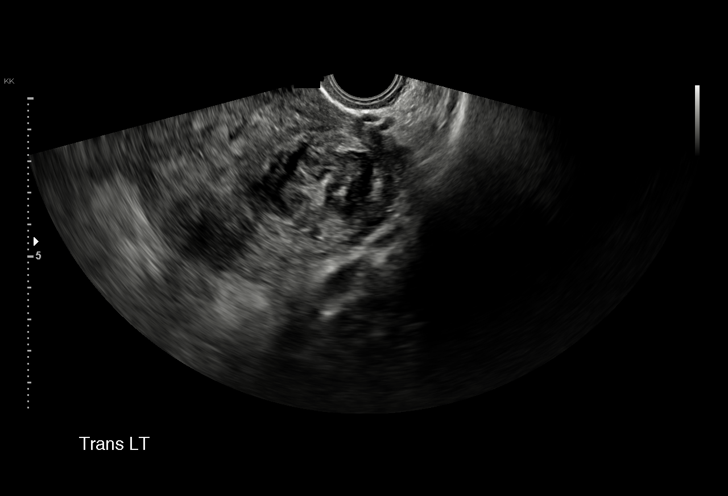
[im 56/79]
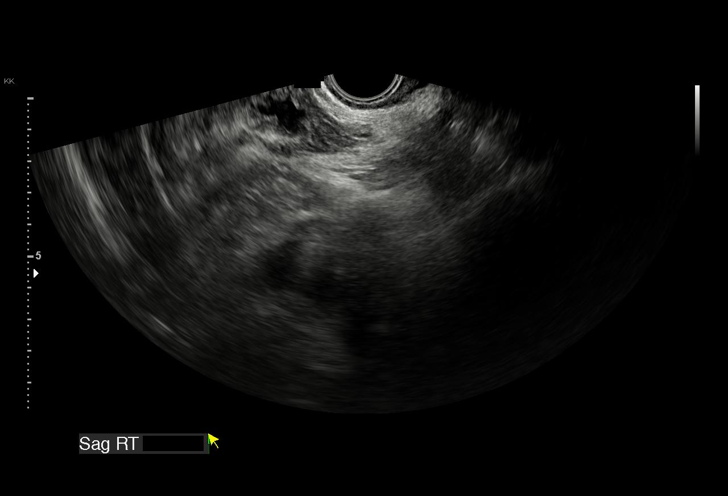
[im 62/79]
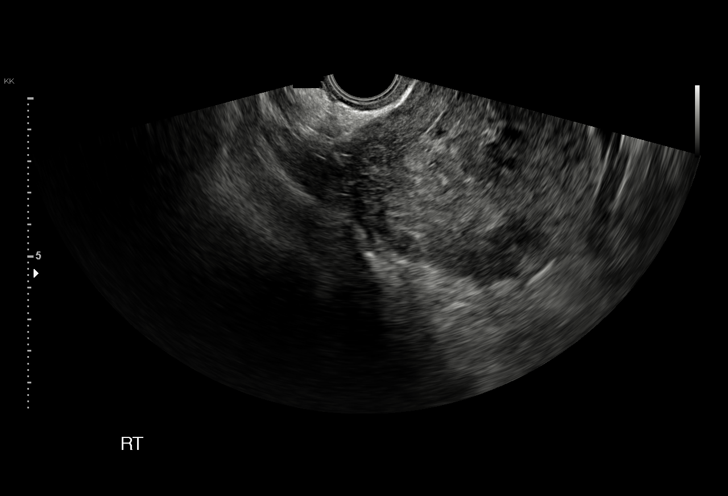
[im 66/79]
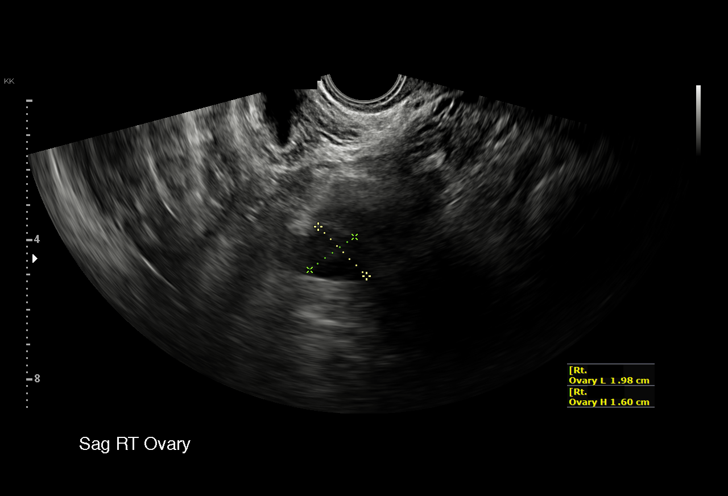
[im 72/79]
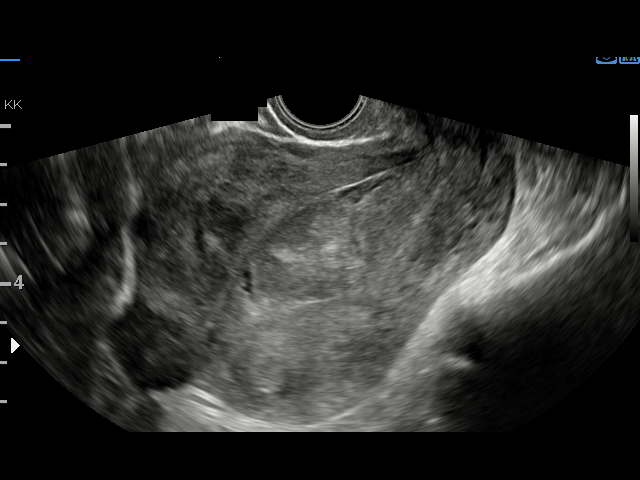
[im 79/79]
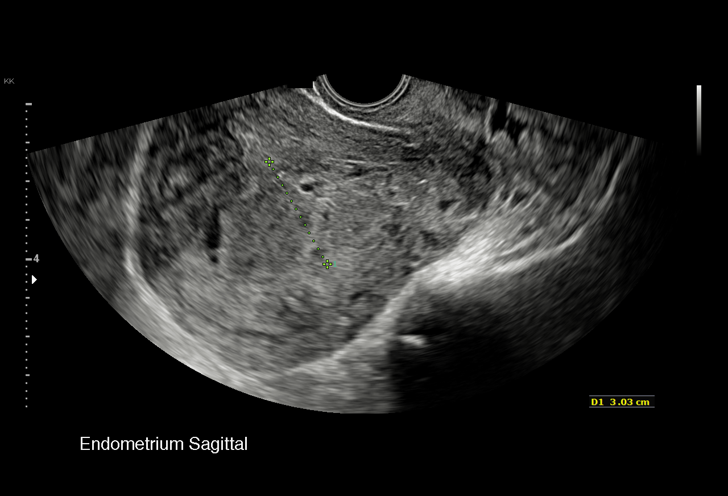

[15 of 25 positions shown; findings below may reference images not displayed]

FINDINGS: Uterus

Measurements: 10.3 x 7.6 x 9.5 cm = volume: 391 mL. Anteverted.
Diffusely inhomogeneous myometrial echogenicity. Posterior mid
uterine leiomyoma 3.3 x 3.4 x 3.8 cm, question partially calcified,
extends submucosal. Additional LEFT posterior leiomyoma 4.3 x 2.7 x
4.4 cm, intramural. Small fundal leiomyoma 1.7 x 2.2 x 2.3 cm,
subserosal.

Endometrium

Thickness: Markedly thickened measuring 30 mm. Heterogeneous
appearance. Minimal fluid. Small amount of peripheral blood flow. A
portion of this appears to represent mobile heterogeneous material
question blood on cine imaging. A discrete 16 x 14 mm diameter
slightly hyperechoic mass is suspected.

Right ovary

Measurements: 2.0 x 1.6 x 2.9 cm = volume: 4.8 mL. Normal morphology
without mass

Left ovary

Measurements: 3.2 x 2.0 x 2.8 cm = volume: 9.3 mL. Normal morphology
without mass

Other findings:  Trace free pelvic fluid.  No adnexal masses.
IMPRESSION: Multiple uterine leiomyomata.

Markedly thickened abnormal appearing endometrial complex 30 mm
thick, containing blood and question discrete 16 x 14 mm diameter
mass nodule; consider further evaluation with sonohysterogram for
confirmation prior to hysteroscopy. Endometrial sampling should also
be considered if patient is at high risk for endometrial carcinoma.
(Ref: Radiological Reasoning: Algorithmic Workup of Abnormal Vaginal
Bleeding with Endovaginal Sonography and Sonohysterography. AJR
[id number]; 191:S68-73)

## 2020-05-05 ENCOUNTER — Telehealth: Payer: Self-pay | Admitting: *Deleted

## 2020-05-05 NOTE — Telephone Encounter (Signed)
I called to schedule mammogram with [facility] but patient does not have insurance - she has [facility] discount; she will need to go thru [employer]. Will send referral to [facility]. Sondi Desch,RN

## 2020-05-05 NOTE — Telephone Encounter (Signed)
-----   Message from Luvenia Redden, PA-C sent at 05/05/2020  2:41 PM EDT ----- Hello  Please schedule mammogram for this patient ordered at a recent visit.   Thank you!  Almyra Free

## 2020-05-05 NOTE — Telephone Encounter (Signed)
I called Patient with [employer] # [phone] and left a message we are calling with some information , please call our office back. When she calls back we need to tell her we have referred her to [facility] to see if she qualifies for free mammogram. Deangela Randleman,RN

## 2020-05-06 ENCOUNTER — Other Ambulatory Visit: Payer: Self-pay

## 2020-05-06 NOTE — Telephone Encounter (Signed)
Called patient with assistance of [employer] [place], Cristie Hem # [phone].   Message was left for patient to call the office for information on appointment.   Patient has referral for [facility] and will be called to see if she qualifies for free Mammogram.   Letter sent to patient.

## 2020-05-12 ENCOUNTER — Other Ambulatory Visit: Payer: Self-pay

## 2020-05-12 MED FILL — Valsartan-Hydrochlorothiazide Tab 80-12.5 MG: ORAL | 30 days supply | Qty: 30 | Fill #0 | Status: AC

## 2020-05-12 MED FILL — Amlodipine Besylate Tab 5 MG (Base Equivalent): ORAL | 30 days supply | Qty: 30 | Fill #0 | Status: AC

## 2020-05-22 ENCOUNTER — Other Ambulatory Visit: Payer: Self-pay

## 2020-05-22 MED FILL — Atorvastatin Calcium Tab 20 MG (Base Equivalent): ORAL | 30 days supply | Qty: 30 | Fill #1 | Status: AC

## 2020-06-17 ENCOUNTER — Other Ambulatory Visit: Payer: Self-pay

## 2020-06-17 MED FILL — Haloperidol Tab 2 MG: ORAL | 30 days supply | Qty: 30 | Fill #1 | Status: CN

## 2020-06-17 MED FILL — Amlodipine Besylate Tab 5 MG (Base Equivalent): ORAL | 30 days supply | Qty: 30 | Fill #1 | Status: AC

## 2020-06-17 MED FILL — Valsartan-Hydrochlorothiazide Tab 80-12.5 MG: ORAL | 30 days supply | Qty: 30 | Fill #1 | Status: AC

## 2020-06-17 MED FILL — Atorvastatin Calcium Tab 20 MG (Base Equivalent): ORAL | 30 days supply | Qty: 30 | Fill #2 | Status: AC

## 2020-06-18 ENCOUNTER — Other Ambulatory Visit: Payer: Self-pay

## 2020-06-24 ENCOUNTER — Encounter: Payer: Self-pay | Admitting: Critical Care Medicine

## 2020-06-24 ENCOUNTER — Ambulatory Visit: Payer: Self-pay | Attending: Critical Care Medicine | Admitting: Critical Care Medicine

## 2020-06-24 ENCOUNTER — Other Ambulatory Visit: Payer: Self-pay

## 2020-06-24 VITALS — BP 136/85 | HR 111 | Ht 61.0 in | Wt 159.0 lb

## 2020-06-24 DIAGNOSIS — Z124 Encounter for screening for malignant neoplasm of cervix: Secondary | ICD-10-CM

## 2020-06-24 DIAGNOSIS — F259 Schizoaffective disorder, unspecified: Secondary | ICD-10-CM

## 2020-06-24 DIAGNOSIS — E559 Vitamin D deficiency, unspecified: Secondary | ICD-10-CM

## 2020-06-24 DIAGNOSIS — E7800 Pure hypercholesterolemia, unspecified: Secondary | ICD-10-CM

## 2020-06-24 DIAGNOSIS — I10 Essential (primary) hypertension: Secondary | ICD-10-CM

## 2020-06-24 DIAGNOSIS — Z23 Encounter for immunization: Secondary | ICD-10-CM

## 2020-06-24 DIAGNOSIS — Z1231 Encounter for screening mammogram for malignant neoplasm of breast: Secondary | ICD-10-CM

## 2020-06-24 MED ORDER — VITAMIN D (ERGOCALCIFEROL) 1.25 MG (50000 UNIT) PO CAPS
ORAL_CAPSULE | ORAL | 1 refills | Status: DC
  Filled 2020-06-24: qty 12, 84d supply, fill #0
  Filled 2020-09-17: qty 12, 84d supply, fill #1

## 2020-06-24 MED ORDER — ATORVASTATIN CALCIUM 20 MG PO TABS
ORAL_TABLET | ORAL | 2 refills | Status: DC
  Filled 2020-06-24: qty 90, fill #0
  Filled 2020-07-31: qty 30, 30d supply, fill #0
  Filled 2020-08-28: qty 30, 30d supply, fill #1
  Filled 2020-10-02: qty 30, 30d supply, fill #2
  Filled 2020-11-17: qty 30, 30d supply, fill #3

## 2020-06-24 MED ORDER — AMLODIPINE BESYLATE 5 MG PO TABS
ORAL_TABLET | Freq: Every day | ORAL | 1 refills | Status: DC
  Filled 2020-06-24: qty 90, fill #0
  Filled 2020-07-17: qty 30, 30d supply, fill #0
  Filled 2020-08-18: qty 30, 30d supply, fill #1
  Filled 2020-09-17: qty 30, 30d supply, fill #2
  Filled 2020-10-15: qty 30, 30d supply, fill #3
  Filled 2020-11-10: qty 30, 30d supply, fill #4

## 2020-06-24 MED ORDER — VALSARTAN-HYDROCHLOROTHIAZIDE 80-12.5 MG PO TABS
1.0000 | ORAL_TABLET | Freq: Every day | ORAL | 2 refills | Status: DC
  Filled 2020-06-24 – 2020-07-17 (×2): qty 90, 90d supply, fill #0
  Filled 2020-10-15: qty 30, 30d supply, fill #1
  Filled 2020-11-10: qty 30, 30d supply, fill #2

## 2020-06-24 NOTE — Patient Instructions (Signed)
A pneumonia vaccine was given  We will get you rescheduled for the mammogram we will need to have the scholarship program application done and we will assist you with that so the mammogram will be free  No change in medications refill sent to our pharmacy  Return to see Dr. Joya Gaskins 5 months  Un vaccin contre la pneumonie a t Magazine features editor  Nous vous ferons reprogrammer la [employer], nous aurons besoin de Information systems manager la demande de programme de bourses et nous vous aiderons pour que la mammographie soit gratuite  Pas de changement dans les recharges de mdicaments envoyes  notre pharmacie  Retourner voir le Dr Joya Gaskins 5 mois

## 2020-06-24 NOTE — Assessment & Plan Note (Signed)
Patient is yet to receive the mammogram will get her to sign up for the scholarship program and get the mammogram scheduled

## 2020-06-24 NOTE — Assessment & Plan Note (Signed)
Hypertension well controlled  No change in amlodipine and valsartan HCT  Return to see patient in 5 months

## 2020-06-24 NOTE — Assessment & Plan Note (Signed)
Continue vitamin D weekly.  

## 2020-06-24 NOTE — Assessment & Plan Note (Signed)
Continue statin therapy.

## 2020-06-24 NOTE — Progress Notes (Signed)
Subjective:    Patient ID: Cindy Garrison, female    DOB: 04/15/1970, 50 y.o.   MRN: [id number]  49 y.o.F former [facility] pt.  Here to reest PCP Hx of HTN Schizophrenia, Iron def anemia  01/23/20 Last seen 09/2019 by Dr Margarita Rana  This patient has a history of hyperlipidemia hypertension schizoaffective disorder and here for follow-up chronic disease management.  Her mental health is managed by psychiatrist at the behavioral [facility] and she is maintaining her haloperidol without difficulty.  Patient states she also maintains her blood pressure medicine but note on arrival blood pressure is elevated 145/87.  She states her right shoulder pain she had previously has now resolved.  Patient does take her cholesterol medication is due for repeat lipid panel and is fasting today. Note this patient had her Covid vaccine [employer] in May 2021 and she knows she needs a booster vaccine at this time  There are no new symptom complaints  The patient is deferring a flu vaccine at this time the patient does need a mammogram and colon cancer screening and Pap smear screening  The patient does see gynecology for her uterine fibroids and will need a Pap smear and a mammogram was ordered but she is yet to have this scheduled  03/24/20 this visit was assisted by [place] [facility] [employer] interpreters for [place] Since the last office visit the patient went to the urgent care just over the past week and because of edema in the lower extremities her amlodipine dose was reduced to 5 mg a day she states in the interim she has stayed on the 5 mg daily dose and has been taking the valsartan and cutting it in half of 160/25 tablet on arrival edema has resolved and blood pressure is 115/74  Patient continues haloperidol per psychiatry.  Patient also is taking the atorvastatin.  The patient is yet to have her gynecology visit.  Patient does take vitamin D weekly 50,000 units.  There are no other  complaints  06/24/2020 This patient is seen today by way of a [place] interpreter [place] [phone] from [place] interpreters on audio.  This patient has history of hypertension and is doing well on the combination amlodipine valsartan HCT.  After reducing amlodipine to 5 mg daily and starting the hydrochlorothiazide her edema in the lower extremities has resolved.  She has no real complaints at this visit.  She has yet to receive the mammogram likely because she did not get into the scholarship program and she does not have any insurance.  Patient is due a Pneumovax.  Patient continues to be on haloperidol per psychiatry and is following up with psychiatry.  Patient maintains atorvastatin and vitamin D as well.  Patient did go to gynecology and had a Pap smear done and was negative.  There are no other primary care gaps in existence.  The patient is fully vaccinated did receive her booster vaccine from [facility] in March  Past Medical History:  Diagnosis Date  . #[id number]   . Acne   . Back pain   . Essential hypertension   . Seasonal allergies   . Syncope 09/16/2014  . Syncope and collapse 09/16/2014     History reviewed. No pertinent family history.   Social History   Socioeconomic History  . Marital status: Married    Spouse name: Not on file  . Number of children: Not on file  . Years of education: Not on file  . Highest education  level: Not on file  Occupational History  . Not on file  Tobacco Use  . Smoking status: Never Smoker  . Smokeless tobacco: Never Used  Vaping Use  . Vaping Use: Never used  Substance and Sexual Activity  . Alcohol use: Not Currently    Comment: occ.  . Drug use: No  . Sexual activity: Not on file    Comment: monogamous female partner for 5 years (2010)  Other Topics Concern  . Not on file  Social History Narrative  . Not on file   Social Determinants of Health   Financial Resource Strain: Not on file  Food Insecurity: Not on file   Transportation Needs: Not on file  Physical Activity: Not on file  Stress: Not on file  Social Connections: Not on file  Intimate Partner Violence: Not on file     Allergies  Allergen Reactions  . Other Rash    Blood pressure pill (unknown name)     Outpatient Medications Prior to Visit  Medication Sig Dispense Refill  . haloperidol (HALDOL) 2 MG tablet TAKE 1 TABLET (2 MG TOTAL) BY MOUTH AT BEDTIME. 30 tablet 3  . amLODipine (NORVASC) 5 MG tablet TAKE 1 TABLET (5 MG TOTAL) BY MOUTH DAILY. 90 tablet 1  . atorvastatin (LIPITOR) 20 MG tablet TAKE 1 TABLET (20 MG TOTAL) BY MOUTH DAILY. TO LOWER CHOLESTEROL 90 tablet 2  . valsartan-hydrochlorothiazide (DIOVAN-HCT) 80-12.5 MG tablet TAKE 1 TABLET BY MOUTH DAILY. 90 tablet 2  . Vitamin D, Ergocalciferol, (DRISDOL) 1.25 MG (50000 UNIT) CAPS capsule TAKE 1 CAPSULE (50,000 UNITS TOTAL) BY MOUTH EVERY 7 (SEVEN) DAYS. 16 capsule 1   No facility-administered medications prior to visit.     Review of Systems  Constitutional: Negative.   HENT: Negative.   Eyes: Negative.   Respiratory: Negative.   Gastrointestinal: Negative.   Endocrine: Negative.   Genitourinary: Negative.   Musculoskeletal: Negative.        Objective:   Physical Exam Vitals:   06/24/20 0844  BP: 136/85  Pulse: (!) 111  SpO2: 98%  Weight: 159 lb (72.1 kg)  Height: 5\' 1"  (1.549 m)    Gen: Pleasant, well-nourished, in no distress,  normal affect  ENT: No lesions,  mouth clear,  oropharynx clear, no postnasal drip  Neck: No JVD, no TMG, no carotid bruits  Lungs: No use of accessory muscles, no dullness to percussion, clear without rales or rhonchi  Cardiovascular: RRR, heart sounds normal, no murmur or gallops, no peripheral edema  Abdomen: soft and NT, no HSM,  BS normal  Musculoskeletal: No deformities, no cyanosis or clubbing  Neuro: alert, non focal  Skin: Warm, no lesions or rashes        Assessment & Plan:  I personally reviewed all  images and lab data in the [facility] system as well as any outside material available during this office visit and agree with the  radiology impressions.   Essential hypertension Hypertension well controlled  No change in amlodipine and valsartan HCT  Return to see patient in 5 months  Schizoaffective disorder ([place]) Schizoaffective disorder under the care of psychiatry stable at this time continue Haldol as prescribed  Screening mammogram for breast cancer Patient is yet to receive the mammogram will get her to sign up for the scholarship program and get the mammogram scheduled  Cervical cancer screening Cervical cancer screening negative repeat in 3 years  Vitamin D deficiency Continue vitamin D weekly  Pure hypercholesterolemia Continue statin therapy  Cindy Garrison was seen today for hypertension.  Diagnoses and all orders for this visit:  Need for pneumococcal vaccine -     Pneumococcal conjugate vaccine 20-valent  Pure hypercholesterolemia -     atorvastatin (LIPITOR) 20 MG tablet; TAKE 1 TABLET (20 MG TOTAL) BY MOUTH DAILY. TO LOWER CHOLESTEROL  Essential hypertension  Schizoaffective disorder, unspecified type ([place])  Screening mammogram for breast cancer  Cervical cancer screening  Vitamin D deficiency  Other orders -     amLODipine (NORVASC) 5 MG tablet; TAKE 1 TABLET (5 MG TOTAL) BY MOUTH DAILY. -     valsartan-hydrochlorothiazide (DIOVAN-HCT) 80-12.5 MG tablet; TAKE 1 TABLET BY MOUTH DAILY. -     Vitamin D, Ergocalciferol, (DRISDOL) 1.25 MG (50000 UNIT) CAPS capsule; TAKE 1 CAPSULE (50,000 UNITS TOTAL) BY MOUTH EVERY 7 (SEVEN) DAYS.  I spent 28 minutes with this patient face-to-face going over her medications performing a limited exam this was moderate level decision making  Patient did receive a PCV 20 pneumococcal vaccine at this visit and will get her mammogram scheduled

## 2020-06-24 NOTE — Assessment & Plan Note (Signed)
Cervical cancer screening negative repeat in 3 years

## 2020-06-24 NOTE — Assessment & Plan Note (Signed)
Schizoaffective disorder under the care of psychiatry stable at this time continue Haldol as prescribed

## 2020-06-25 ENCOUNTER — Other Ambulatory Visit: Payer: Self-pay

## 2020-06-30 ENCOUNTER — Other Ambulatory Visit: Payer: Self-pay

## 2020-06-30 MED FILL — Haloperidol Tab 2 MG: ORAL | 30 days supply | Qty: 30 | Fill #1 | Status: AC

## 2020-07-02 ENCOUNTER — Telehealth: Payer: Self-pay

## 2020-07-02 NOTE — Telephone Encounter (Signed)
Telephoned pt at mobile number  06/26/2020, 07/01/2020, 07/02/2020. Pt didn't answer was unable to leave message with [facility] appt information.

## 2020-07-03 ENCOUNTER — Other Ambulatory Visit: Payer: Self-pay

## 2020-07-03 ENCOUNTER — Encounter (HOSPITAL_COMMUNITY): Payer: Self-pay | Admitting: Emergency Medicine

## 2020-07-03 ENCOUNTER — Ambulatory Visit (HOSPITAL_COMMUNITY)
Admission: EM | Admit: 2020-07-03 | Discharge: 2020-07-03 | Disposition: A | Discharge status: Home / Self Care | Payer: Self-pay | Attending: Internal Medicine | Admitting: Internal Medicine

## 2020-07-03 DIAGNOSIS — M25472 Effusion, left ankle: Secondary | ICD-10-CM

## 2020-07-03 MED ORDER — NAPROXEN 375 MG PO TABS
375.0000 mg | ORAL_TABLET | Freq: Two times a day (BID) | ORAL | 0 refills | Status: DC
  Filled 2020-07-03: qty 20, 10d supply, fill #0

## 2020-07-03 NOTE — ED Provider Notes (Signed)
[name]    CSN: [id number] Arrival date & time: 07/03/20  6195      History   Chief Complaint Chief Complaint  Patient presents with   Foot Pain    HPI Cindy Garrison is a 50 y.o. female comes to the urgent care with left ankle pain and swelling for a few days duration.  Patient says symptom onset was insidious and has been persistent.  She denies any trauma or falls.  She works in a [employer] and stands for 8 hours a day.  Pain is sharp, aggravated by movement and palpation.  No known relieving factors.  It is associated with swelling.  Patient denies any shortness of breath, orthopnea or paroxysmal nocturnal dyspnea.  No calf pain.  No long distance travel.  No shortness of breath or chest pain.  No history of DVT or gout. HPI  Past Medical History:  Diagnosis Date   #[id number]    Acne    Back pain    Essential hypertension    Seasonal allergies    Syncope 09/16/2014   Syncope and collapse 09/16/2014    Patient Active Problem List   Diagnosis Date Noted   Visit for routine gyn exam 04/09/2020   History of endometrial ablation 04/09/2020   Screening mammogram for breast cancer 04/09/2020   Cervical cancer screening 01/23/2020   Pure hypercholesterolemia 01/23/2020   Colon cancer screening 01/23/2020   Vitamin D deficiency 01/23/2020   Fibroid uterus 03/26/2015   Schizoaffective disorder ([place]) 09/16/2014   Essential hypertension    Allergic rhinitis 09/13/2006    Past Surgical History:  Procedure Laterality Date   DILITATION & CURRETTAGE/HYSTROSCOPY WITH NOVASURE ABLATION N/A 12/22/2017   Procedure: DILATATION & CURETTAGE/ HYSTEROSCOPY WITH ENDOMETRIAL FIBROID AND  NOVASURE ABLATION;  Surgeon: Lavonia Drafts, MD;  Location: [facility];  Service: Gynecology;  Laterality: N/A;    OB History     Gravida  3   Para  0   Term  0   Preterm  0   AB  3   Living  0      SAB  3   IAB  0   Ectopic  0   Multiple  0   Live  Births               Home Medications    Prior to Admission medications   Medication Sig Start Date End Date Taking? Authorizing Provider  amLODipine (NORVASC) 5 MG tablet TAKE 1 TABLET (5 MG TOTAL) BY MOUTH DAILY. 06/24/20 06/24/21 Yes Elsie Stain, MD  atorvastatin (LIPITOR) 20 MG tablet TAKE 1 TABLET (20 MG TOTAL) BY MOUTH DAILY. TO LOWER CHOLESTEROL 06/24/20 06/24/21 Yes Elsie Stain, MD  haloperidol (HALDOL) 2 MG tablet TAKE 1 TABLET (2 MG TOTAL) BY MOUTH AT BEDTIME. 04/01/20 04/01/21 Yes Nevada Crane, MD  naproxen (NAPROSYN) 375 MG tablet Take 1 tablet (375 mg total) by mouth 2 (two) times daily. 07/03/20  Yes Arlanda Shiplett, Myrene Galas, MD  valsartan-hydrochlorothiazide (DIOVAN-HCT) 80-12.5 MG tablet TAKE 1 TABLET BY MOUTH DAILY. 06/24/20 06/24/21 Yes Elsie Stain, MD  Vitamin D, Ergocalciferol, (DRISDOL) 1.25 MG (50000 UNIT) CAPS capsule TAKE 1 CAPSULE (50,000 UNITS TOTAL) BY MOUTH EVERY 7 (SEVEN) DAYS. 06/24/20 06/24/21  Elsie Stain, MD  hydrochlorothiazide (HYDRODIURIL) 25 MG tablet Take 1 tablet (25 mg total) by mouth daily. 09/20/19 01/23/20  Charlott Rakes, MD  lisinopril (ZESTRIL) 40 MG tablet TAKE 1 TABLET (40 MG TOTAL) BY MOUTH DAILY. TO LOWER  BLOOD PRESSURE 01/08/20 01/23/20  Charlott Rakes, MD  omeprazole (PRILOSEC) 40 MG capsule Take 1 capsule (40 mg total) by mouth 2 (two) times daily before a meal. 12/28/18 12/29/18  Antony Blackbird, MD    Family History History reviewed. No pertinent family history.  Social History Social History   Tobacco Use   Smoking status: Never   Smokeless tobacco: Never  Vaping Use   Vaping Use: Never used  Substance Use Topics   Alcohol use: Not Currently    Comment: occ.   Drug use: No     Allergies   Other   Review of Systems Review of Systems  Constitutional: Negative.   Gastrointestinal: Negative.   Musculoskeletal:  Positive for arthralgias and joint swelling.  Skin: Negative.     Physical Exam Triage Vital Signs ED Triage  Vitals  Enc Vitals Group     BP 07/03/20 0847 135/81     Pulse Rate 07/03/20 0847 (!) 104     Resp 07/03/20 0847 18     Temp 07/03/20 0847 98.8 F (37.1 C)     Temp Source 07/03/20 0847 Oral     SpO2 07/03/20 0847 99 %     Weight --      Height --      Head Circumference --      Peak Flow --      Pain Score 07/03/20 0842 5     Pain Loc --      Pain Edu? --      Excl. in [place]? --    No data found.  Updated Vital Signs BP 135/81 (BP Location: Right Arm)   Pulse (!) 104   Temp 98.8 F (37.1 C) (Oral)   Resp 18   LMP 09/12/2017 (LMP Unknown)   SpO2 99%   Visual Acuity Right Eye Distance:   Left Eye Distance:   Bilateral Distance:    Right Eye Near:   Left Eye Near:    Bilateral Near:     Physical Exam   UC Treatments / Results  Labs (all labs ordered are listed, but only abnormal results are displayed) Labs Reviewed - No data to display  EKG   Radiology No results found.  Procedures Procedures (including critical care time)  Medications Ordered in UC Medications - No data to display  Initial Impression / Assessment and Plan / UC Course  I have reviewed the triage vital signs and the nursing notes.  Pertinent labs & imaging results that were available during my care of the patient were reviewed by me and considered in my medical decision making (see chart for details).     1.  Left ankle swelling: Elevate leg lower extremity Icing of the left ankle after long hours of work. NSAIDs as needed for pain/swelling Compression stockings to be worn when patient is working longer hours No indication for x-rays at this time Patient has no calf tenderness.  Wells score is low probability for DVT/PE.  Final Clinical Impressions(s) / UC Diagnoses   Final diagnoses:  Left ankle swelling     Discharge Instructions      Elevation of the left lower extremity Icing of the left ankle Take medications as prescribed No indication for x-rays at this  time Return to urgent care if symptoms worsen Gentle range of motion exercises. Please buy compression stockings from [facility] or [facility]   ED Prescriptions     Medication Sig Dispense Auth. Provider   naproxen (NAPROSYN) 375 MG tablet Take 1  tablet (375 mg total) by mouth 2 (two) times daily. 20 tablet Sheanna Dail, Myrene Galas, MD      PDMP not reviewed this encounter.   Chase Picket, MD 07/03/20 1750

## 2020-07-03 NOTE — Discharge Instructions (Addendum)
Elevation of the left lower extremity Icing of the left ankle Take medications as prescribed No indication for x-rays at this time Return to urgent care if symptoms worsen Gentle range of motion exercises. Please buy compression stockings from [facility] or [facility]

## 2020-07-03 NOTE — ED Triage Notes (Signed)
Patient has left foot and ankle pain.  Left foot is swollen and painful.  Denies fall.  Pedal pulse is 2 +, able to wiggle toes

## 2020-07-17 ENCOUNTER — Other Ambulatory Visit: Payer: Self-pay

## 2020-07-17 ENCOUNTER — Other Ambulatory Visit: Payer: Self-pay | Admitting: Internal Medicine

## 2020-07-18 ENCOUNTER — Encounter (HOSPITAL_COMMUNITY): Payer: Self-pay | Admitting: Emergency Medicine

## 2020-07-18 ENCOUNTER — Other Ambulatory Visit: Payer: Self-pay

## 2020-07-18 ENCOUNTER — Ambulatory Visit (HOSPITAL_COMMUNITY)
Admission: EM | Admit: 2020-07-18 | Discharge: 2020-07-18 | Disposition: A | Discharge status: Home / Self Care | Payer: Self-pay | Attending: Family Medicine | Admitting: Family Medicine

## 2020-07-18 DIAGNOSIS — M25572 Pain in left ankle and joints of left foot: Secondary | ICD-10-CM

## 2020-07-18 MED ORDER — NAPROXEN 500 MG PO TABS
500.0000 mg | ORAL_TABLET | Freq: Two times a day (BID) | ORAL | 0 refills | Status: DC | PRN
  Filled 2020-07-18: qty 30, 15d supply, fill #0

## 2020-07-18 NOTE — ED Triage Notes (Signed)
PT reports left lower leg pain and swelling for more than a month. Denies injury.   Was seen for same 6/16.

## 2020-07-22 ENCOUNTER — Other Ambulatory Visit (HOSPITAL_BASED_OUTPATIENT_CLINIC_OR_DEPARTMENT_OTHER): Payer: Self-pay

## 2020-07-22 NOTE — ED Provider Notes (Signed)
[facility]    CSN: [id number] Arrival date & time: 07/18/20  4742      History   Chief Complaint Chief Complaint  Patient presents with   Foot Pain    HPI Cindy Garrison is a 50 y.o. female.   Medical interpreter used today to facilitate visit with patient's consent. She is presenting today with left ankle pain and swelling without known injury. Was seen about a month ago for the same and given naproxen, which she states helped quite a bit and sxs had improved but returned once the medication ran out. She denies numbness, tingling, discoloration, decreased ROM, fever, chills, CP, SOB, palpitations, known hx of injury to this area. Since running out of naproxen, has not tried anything for sxs.    Past Medical History:  Diagnosis Date   #[id number]    Acne    Back pain    Essential hypertension    Seasonal allergies    Syncope 09/16/2014   Syncope and collapse 09/16/2014    Patient Active Problem List   Diagnosis Date Noted   Visit for routine gyn exam 04/09/2020   History of endometrial ablation 04/09/2020   Screening mammogram for breast cancer 04/09/2020   Cervical cancer screening 01/23/2020   Pure hypercholesterolemia 01/23/2020   Colon cancer screening 01/23/2020   Vitamin D deficiency 01/23/2020   Fibroid uterus 03/26/2015   Schizoaffective disorder ([place]) 09/16/2014   Essential hypertension    Allergic rhinitis 09/13/2006    Past Surgical History:  Procedure Laterality Date   DILITATION & CURRETTAGE/HYSTROSCOPY WITH NOVASURE ABLATION N/A 12/22/2017   Procedure: DILATATION & CURETTAGE/ HYSTEROSCOPY WITH ENDOMETRIAL FIBROID AND  NOVASURE ABLATION;  Surgeon: Lavonia Drafts, MD;  Location: [facility];  Service: Gynecology;  Laterality: N/A;    OB History     Gravida  3   Para  0   Term  0   Preterm  0   AB  3   Living  0      SAB  3   IAB  0   Ectopic  0   Multiple  0   Live Births               Home Medications     Prior to Admission medications   Medication Sig Start Date End Date Taking? Authorizing Provider  amLODipine (NORVASC) 5 MG tablet TAKE 1 TABLET (5 MG TOTAL) BY MOUTH DAILY. 06/24/20 06/24/21 Yes Elsie Stain, MD  atorvastatin (LIPITOR) 20 MG tablet TAKE 1 TABLET (20 MG TOTAL) BY MOUTH DAILY. TO LOWER CHOLESTEROL 06/24/20 06/24/21 Yes Elsie Stain, MD  haloperidol (HALDOL) 2 MG tablet TAKE 1 TABLET (2 MG TOTAL) BY MOUTH AT BEDTIME. 04/01/20 04/01/21 Yes [place] Crane, MD  naproxen (NAPROSYN) 500 MG tablet Take 1 tablet (500 mg total) by mouth 2 (two) times daily as needed. 07/18/20  Yes Volney American, PA-C  valsartan-hydrochlorothiazide (DIOVAN-HCT) 80-12.5 MG tablet TAKE 1 TABLET BY MOUTH DAILY. 06/24/20 06/24/21 Yes Elsie Stain, MD  Vitamin D, Ergocalciferol, (DRISDOL) 1.25 MG (50000 UNIT) CAPS capsule TAKE 1 CAPSULE (50,000 UNITS TOTAL) BY MOUTH EVERY 7 (SEVEN) DAYS. 06/24/20 06/24/21 Yes Elsie Stain, MD  naproxen (NAPROSYN) 375 MG tablet Take 1 tablet (375 mg total) by mouth 2 (two) times daily. 07/03/20   [place], Myrene Galas, MD  hydrochlorothiazide (HYDRODIURIL) 25 MG tablet Take 1 tablet (25 mg total) by mouth daily. 09/20/19 01/23/20  Charlott Rakes, MD  lisinopril (ZESTRIL) 40 MG tablet TAKE 1  TABLET (40 MG TOTAL) BY MOUTH DAILY. TO LOWER BLOOD PRESSURE 01/08/20 01/23/20  Charlott Rakes, MD  omeprazole (PRILOSEC) 40 MG capsule Take 1 capsule (40 mg total) by mouth 2 (two) times daily before a meal. 12/28/18 12/29/18  Antony Blackbird, MD    Family History No family history on file.  Social History Social History   Tobacco Use   Smoking status: Never   Smokeless tobacco: Never  Vaping Use   Vaping Use: Never used  Substance Use Topics   Alcohol use: Not Currently    Comment: occ.   Drug use: No     Allergies   Other   Review of Systems Review of Systems PER HPI    Physical Exam Triage Vital Signs ED Triage Vitals  Enc Vitals Group     BP 07/18/20 1021 140/74      Pulse Rate 07/18/20 1020 85     Resp 07/18/20 1020 16     Temp 07/18/20 1020 99 F (37.2 C)     Temp Source 07/18/20 1020 Oral     SpO2 07/18/20 1020 100 %     Weight --      Height --      Head Circumference --      Peak Flow --      Pain Score 07/18/20 1017 6     Pain Loc --      Pain Edu? --      Excl. in [place]? --    No data found.  Updated Vital Signs BP 140/74   Pulse 85   Temp 99 F (37.2 C) (Oral)   Resp 16   LMP 09/12/2017 (LMP Unknown)   SpO2 100%   Visual Acuity Right Eye Distance:   Left Eye Distance:   Bilateral Distance:    Right Eye Near:   Left Eye Near:    Bilateral Near:     Physical Exam Vitals and nursing note reviewed.  Constitutional:      Appearance: Normal appearance. She is not ill-appearing.  HENT:     Head: Atraumatic.  Eyes:     Extraocular Movements: Extraocular movements intact.     Conjunctiva/sclera: Conjunctivae normal.  Cardiovascular:     Rate and Rhythm: Normal rate and regular rhythm.     Heart sounds: Normal heart sounds.  Pulmonary:     Effort: Pulmonary effort is normal.     Breath sounds: Normal breath sounds.  Musculoskeletal:        General: Swelling and tenderness present. No deformity. Normal range of motion.     Cervical back: Normal range of motion and neck supple.     Comments: Trace edema diffusely circumferentially around left ankle, b/l left ankle mildly ttp but no palpable deformity, decreased ROM, laxity. Neg homans sign and squeeze test left lower leg  Skin:    General: Skin is warm and dry.     Findings: No bruising, erythema or lesion.  Neurological:     Mental Status: She is alert and oriented to person, place, and time.  Psychiatric:        Mood and Affect: Mood normal.        Thought Content: Thought content normal.        Judgment: Judgment normal.     UC Treatments / Results  Labs (all labs ordered are listed, but only abnormal results are displayed) Labs Reviewed - No data to  display  EKG   Radiology No results found.  Procedures Procedures (including critical  care time)  Medications Ordered in UC Medications - No data to display  Initial Impression / Assessment and Plan / UC Course  I have reviewed the triage vital signs and the nursing notes.  Pertinent labs & imaging results that were available during my care of the patient were reviewed by me and considered in my medical decision making (see chart for details).     Suspect inflammatory/arthritic cause of her ongoing pain. Refill naproxen for prn use, discussed supportive shoes, sports medicine f/u if not resolving.   Final Clinical Impressions(s) / UC Diagnoses   Final diagnoses:  Acute left ankle pain   Discharge Instructions   None    ED Prescriptions     Medication Sig Dispense Auth. Provider   naproxen (NAPROSYN) 500 MG tablet Take 1 tablet (500 mg total) by mouth 2 (two) times daily as needed. 30 tablet Volney American, [place]      PDMP not reviewed this encounter.   Merrie Roof [place], [place] 07/22/20 [phone]

## 2020-07-25 ENCOUNTER — Other Ambulatory Visit: Payer: Self-pay

## 2020-07-30 ENCOUNTER — Other Ambulatory Visit: Payer: Self-pay

## 2020-07-30 ENCOUNTER — Encounter (HOSPITAL_COMMUNITY): Payer: Self-pay | Admitting: Psychiatry

## 2020-07-30 ENCOUNTER — Ambulatory Visit (INDEPENDENT_AMBULATORY_CARE_PROVIDER_SITE_OTHER): Payer: No Payment, Other | Admitting: Psychiatry

## 2020-07-30 DIAGNOSIS — F259 Schizoaffective disorder, unspecified: Secondary | ICD-10-CM

## 2020-07-30 MED ORDER — HALOPERIDOL 2 MG PO TABS
ORAL_TABLET | ORAL | 3 refills | Status: DC
  Filled 2020-07-30: qty 30, 30d supply, fill #0
  Filled 2020-08-28: qty 30, 30d supply, fill #1
  Filled 2020-10-15: qty 30, 30d supply, fill #2

## 2020-07-30 NOTE — Progress Notes (Signed)
BH MD/PA/NP OP Progress Note  07/30/2020 9:24 AM Cindy Garrison  MRN:  [id number]  Chief Complaint: "Im doing okay"  Chief Complaint   Medication Management    HPI: 50 year old female seen today for f/u psychiatric evaluation She is a former patient of Jean Rosenthal, transferred for medication management. She has a psychiatric history of schizoaffective disorder. She is currently managed on Haldol 2mg  QD. Patient notes that her medication regimen has continued to be effective.  During exam she is pleasant, cooperative, engaged in conversation.  She notes that overall she is doing well.  She notes that she spends most days with her husband.  She informed Probation officer that she plans to go back home today and clock.  Patient notes that her anxiety and depression continues to be minimal. Provider conducted GAD-7 today scoring 0. Provider also conducted PHQ-9 today scoring 0. She denies SI/HI/VAH, paranoia, mania. She endorses restful sleep and appetite.  No medication changes made today.  Patient agreeable to continue medications as prescribed.    Visit Diagnosis:    ICD-10-CM   1. Schizoaffective disorder, unspecified type ([place])  F25.9 haloperidol (HALDOL) 2 MG tablet      Past Psychiatric History: Schizoaffective disorder, Has been on Haldol for 10 years.  Was being seen at [facility] in the past  Past Medical History:  Past Medical History:  Diagnosis Date   #[id number]    Acne    Back pain    Essential hypertension    Seasonal allergies    Syncope 09/16/2014   Syncope and collapse 09/16/2014    Past Surgical History:  Procedure Laterality Date   DILITATION & CURRETTAGE/HYSTROSCOPY WITH NOVASURE ABLATION N/A 12/22/2017   Procedure: DILATATION & CURETTAGE/ HYSTEROSCOPY WITH ENDOMETRIAL FIBROID AND  NOVASURE ABLATION;  Surgeon: Lavonia Drafts, MD;  Location: [facility];  Service: Gynecology;  Laterality: N/A;    Family Psychiatric History: denied Family History: History reviewed. No  pertinent family history.  Social History:  Social History   Socioeconomic History   Marital status: Married    Spouse name: Not on file   Number of children: Not on file   Years of education: Not on file   Highest education level: Not on file  Occupational History   Not on file  Tobacco Use   Smoking status: Never   Smokeless tobacco: Never  Vaping Use   Vaping Use: Never used  Substance and Sexual Activity   Alcohol use: Not Currently    Comment: occ.   Drug use: No   Sexual activity: Not on file    Comment: monogamous female partner for 5 years (2010)  Other Topics Concern   Not on file  Social History Narrative   Not on file   Social Determinants of Health   Financial Resource Strain: Not on file  Food Insecurity: Not on file  Transportation Needs: Not on file  Physical Activity: Not on file  Stress: Not on file  Social Connections: Not on file    Allergies:  Allergies  Allergen Reactions   Other Rash    Blood pressure pill (unknown name)    Metabolic Disorder Labs: Lab Results  Component Value Date   HGBA1C 5.5 01/24/2018   No results found for: PROLACTIN Lab Results  Component Value Date   CHOL 159 01/23/2020   TRIG 54 01/23/2020   HDL 58 01/23/2020   CHOLHDL 2.7 01/23/2020   LDLCALC 90 01/23/2020   LDLCALC 160 (H) 11/14/2017   Lab Results  Component  Value Date   TSH 1.260 06/20/2019   TSH 1.840 08/24/2018    Therapeutic Level Labs: No results found for: LITHIUM No results found for: VALPROATE No components found for:  CBMZ  Current Medications: Current Outpatient Medications  Medication Sig Dispense Refill   amLODipine (NORVASC) 5 MG tablet TAKE 1 TABLET (5 MG TOTAL) BY MOUTH DAILY. 90 tablet 1   atorvastatin (LIPITOR) 20 MG tablet TAKE 1 TABLET (20 MG TOTAL) BY MOUTH DAILY. TO LOWER CHOLESTEROL 90 tablet 2   haloperidol (HALDOL) 2 MG tablet TAKE 1 TABLET (2 MG TOTAL) BY MOUTH AT BEDTIME. 30 tablet 3   naproxen (NAPROSYN) 375 MG  tablet Take 1 tablet (375 mg total) by mouth 2 (two) times daily. 20 tablet 0   naproxen (NAPROSYN) 500 MG tablet Take 1 tablet (500 mg total) by mouth 2 (two) times daily as needed. 30 tablet 0   valsartan-hydrochlorothiazide (DIOVAN-HCT) 80-12.5 MG tablet TAKE 1 TABLET BY MOUTH DAILY. 90 tablet 2   Vitamin D, Ergocalciferol, (DRISDOL) 1.25 MG (50000 UNIT) CAPS capsule TAKE 1 CAPSULE (50,000 UNITS TOTAL) BY MOUTH EVERY 7 (SEVEN) DAYS. 16 capsule 1   No current facility-administered medications for this visit.     Musculoskeletal: Strength & Muscle Tone: within normal limits Gait & Station: normal Patient leans: N/A  Psychiatric Specialty Exam: Review of Systems  Blood pressure (!) 147/88, pulse 87, height 5\' 1"  (1.549 m), weight 156 lb (70.8 kg), last menstrual period 09/12/2017, SpO2 100 %.Body mass index is 29.48 kg/m.  General Appearance: Well Groomed  Eye Contact:  Good  Speech:  Clear and Coherent and Normal Rate  Volume:  Normal  Mood:  Euthymic  Affect:  Appropriate and Congruent  Thought Process:  Coherent, Goal Directed, and Linear  Orientation:  Full (Time, Place, and Person)  Thought Content: WDL and Logical   Suicidal Thoughts:  No  Homicidal Thoughts:  No  Memory:  Immediate;   Good Recent;   Good Remote;   Good  Judgement:  Good  Insight:  Good  Psychomotor Activity:  Normal  Concentration:  Concentration: Good and Attention Span: Good  Recall:  Good  Fund of Knowledge: Good  Language: Good  Akathisia:  No  Handed:  Right  AIMS (if indicated): not done  Assets:  Communication Skills Desire for Improvement Financial Resources/Insurance Housing Intimacy Social Support  ADL's:  Intact  Cognition: WNL  Sleep:  Good   Screenings: [place] Office Visit from 04/01/2020 in [place] Total Score 0      GAD-7    [place] from 07/30/2020 in [facility]  Office Visit from 04/09/2020 in [place] for [place] at [facility] Office Visit from 01/23/2020 in [place] Office Visit from 12/17/2019 in [place] for [place] at [facility] Office Visit from 10/18/2019 in [place] for [place] at [facility]  Total GAD-7 Score 0 0 0 1 3      PHQ2-9    [place] from 07/30/2020 in [facility] Office Visit from 06/24/2020 in [place] Office Visit from 04/09/2020 in [place] for [place] at [facility] Office Visit from 04/01/2020 in [facility] Office Visit from 01/23/2020 in [place]  PHQ-2 Total Score 0 0  0 0 0  PHQ-9 Total Score 0 1 0 -- --      Flowsheet Row ED from 07/03/2020 in [facility] [place] from 04/01/2020 in [facility] ED from 03/12/2020 in [facility] [place] No Risk Error: Q3, 4, or 5 should not be populated when Q2 is No Error: Question 6 not populated        Assessment and Plan: Patient notes that she is doing well on her current medication regimen.  No medication changes made today.  Patient agreeable to continue medication as prescribed.  1. Schizoaffective disorder, unspecified type ([place])  Continue- haloperidol (HALDOL) 2 MG tablet; TAKE 1 TABLET (2 MG TOTAL) BY MOUTH AT BEDTIME.  Dispense: 30 tablet; Refill: 3    Salley Slaughter, NP 07/30/2020, 9:24 AM

## 2020-07-31 ENCOUNTER — Inpatient Hospital Stay: Admission: RE | Admit: 2020-07-31 | Payer: Self-pay | Source: Ambulatory Visit

## 2020-07-31 ENCOUNTER — Other Ambulatory Visit: Payer: Self-pay

## 2020-08-18 ENCOUNTER — Other Ambulatory Visit: Payer: Self-pay

## 2020-08-28 ENCOUNTER — Other Ambulatory Visit: Payer: Self-pay

## 2020-09-17 ENCOUNTER — Other Ambulatory Visit: Payer: Self-pay

## 2020-09-19 ENCOUNTER — Emergency Department (HOSPITAL_COMMUNITY): Payer: Self-pay

## 2020-09-19 ENCOUNTER — Other Ambulatory Visit: Payer: Self-pay

## 2020-09-19 ENCOUNTER — Encounter (HOSPITAL_COMMUNITY): Payer: Self-pay

## 2020-09-19 ENCOUNTER — Emergency Department (HOSPITAL_COMMUNITY)
Admission: EM | Admit: 2020-09-19 | Discharge: 2020-09-19 | Disposition: A | Discharge status: Home / Self Care | Payer: Self-pay | Attending: Emergency Medicine | Admitting: Emergency Medicine

## 2020-09-19 DIAGNOSIS — Z79899 Other long term (current) drug therapy: Secondary | ICD-10-CM | POA: Insufficient documentation

## 2020-09-19 DIAGNOSIS — I10 Essential (primary) hypertension: Secondary | ICD-10-CM | POA: Insufficient documentation

## 2020-09-19 DIAGNOSIS — R1030 Lower abdominal pain, unspecified: Secondary | ICD-10-CM | POA: Insufficient documentation

## 2020-09-19 DIAGNOSIS — D72829 Elevated white blood cell count, unspecified: Secondary | ICD-10-CM | POA: Insufficient documentation

## 2020-09-19 DIAGNOSIS — E876 Hypokalemia: Secondary | ICD-10-CM | POA: Insufficient documentation

## 2020-09-19 LAB — COMPREHENSIVE METABOLIC PANEL
ALT: 23 U/L (ref 0–44)
AST: 24 U/L (ref 15–41)
Albumin: 4 g/dL (ref 3.5–5.0)
Alkaline Phosphatase: 41 U/L (ref 38–126)
Anion gap: 11 (ref 5–15)
BUN: 8 mg/dL (ref 6–20)
CO2: 22 mmol/L (ref 22–32)
Calcium: 9 mg/dL (ref 8.9–10.3)
Chloride: 100 mmol/L (ref 98–111)
Creatinine, Ser: 0.79 mg/dL (ref 0.44–1.00)
GFR, Estimated: >60 mL/min (ref >60)
Glucose, Bld: 182 mg/dL — ABNORMAL HIGH (ref 70–99)
Potassium: 3.1 mmol/L — ABNORMAL LOW (ref 3.5–5.1)
Sodium: 133 mmol/L — ABNORMAL LOW (ref 135–145)
Total Bilirubin: 0.9 mg/dL (ref 0.3–1.2)
Total Protein: 6.9 g/dL (ref 6.5–8.1)

## 2020-09-19 LAB — URINALYSIS, ROUTINE W REFLEX MICROSCOPIC
Bilirubin Urine: NEGATIVE
Glucose, UA: NEGATIVE mg/dL
Hgb urine dipstick: NEGATIVE
Ketones, ur: NEGATIVE mg/dL
Leukocytes,Ua: NEGATIVE
Nitrite: NEGATIVE
Protein, ur: NEGATIVE mg/dL
Specific Gravity, Urine: 1.005 (ref 1.005–1.030)
pH: 7 (ref 5.0–8.0)

## 2020-09-19 LAB — CBC
HCT: 38.1 % (ref 36.0–46.0)
Hemoglobin: 13.5 g/dL (ref 12.0–15.0)
MCH: 26.2 pg (ref 26.0–34.0)
MCHC: 35.4 g/dL (ref 30.0–36.0)
MCV: 74 fL — ABNORMAL LOW (ref 80.0–100.0)
Platelets: 248 10*3/uL (ref 150–400)
RBC: 5.15 MIL/uL — ABNORMAL HIGH (ref 3.87–5.11)
RDW: 13.9 % (ref 11.5–15.5)
WBC: 11.3 10*3/uL — ABNORMAL HIGH (ref 4.0–10.5)
nRBC: 0 % (ref 0.0–0.2)

## 2020-09-19 LAB — I-STAT BETA HCG BLOOD, ED (MC, WL, AP ONLY): I-stat hCG, quantitative: <5 m[IU]/mL (ref <5)

## 2020-09-19 LAB — LIPASE, BLOOD: Lipase: 21 U/L (ref 11–51)

## 2020-09-19 MED ORDER — SODIUM CHLORIDE 0.9 % IV SOLN: Freq: Once | INTRAVENOUS | Status: DC

## 2020-09-19 MED ORDER — POTASSIUM CHLORIDE CRYS ER 20 MEQ PO TBCR
40.0000 meq | EXTENDED_RELEASE_TABLET | Freq: Once | ORAL | Status: AC
  Administered 2020-09-19: 40 meq via ORAL
  Filled 2020-09-19: qty 2

## 2020-09-19 MED ORDER — OXYCODONE HCL 5 MG PO TABS: 5.0000 mg | ORAL_TABLET | Freq: Three times a day (TID) | ORAL | 0 refills | Status: AC | PRN

## 2020-09-19 MED ORDER — MORPHINE SULFATE (PF) 2 MG/ML IV SOLN
2.0000 mg | Freq: Once | INTRAVENOUS | Status: AC
  Administered 2020-09-19: 2 mg via INTRAVENOUS
  Filled 2020-09-19: qty 1

## 2020-09-19 MED ORDER — IOHEXOL 350 MG/ML SOLN
80.0000 mL | Freq: Once | INTRAVENOUS | Status: AC | PRN
  Administered 2020-09-19: 80 mL via INTRAVENOUS

## 2020-09-19 MED ORDER — DICYCLOMINE HCL 10 MG PO CAPS
20.0000 mg | ORAL_CAPSULE | Freq: Once | ORAL | Status: AC
  Administered 2020-09-19: 20 mg via ORAL
  Filled 2020-09-19: qty 2

## 2020-09-19 MED ORDER — ONDANSETRON HCL 4 MG/2ML IJ SOLN
4.0000 mg | Freq: Once | INTRAMUSCULAR | Status: AC
  Administered 2020-09-19: 4 mg via INTRAVENOUS
  Filled 2020-09-19: qty 2

## 2020-09-19 MED ORDER — KETOROLAC TROMETHAMINE 30 MG/ML IJ SOLN: 30.0000 mg | Freq: Once | INTRAMUSCULAR | Status: DC

## 2020-09-19 MED ORDER — ACETAMINOPHEN 325 MG PO TABS
650.0000 mg | ORAL_TABLET | Freq: Once | ORAL | Status: AC
  Administered 2020-09-19: 650 mg via ORAL
  Filled 2020-09-19: qty 2

## 2020-09-19 MED ORDER — LACTATED RINGERS IV BOLUS
1000.0000 mL | Freq: Once | INTRAVENOUS | Status: AC
  Administered 2020-09-19: 1000 mL via INTRAVENOUS

## 2020-09-19 NOTE — ED Provider Notes (Signed)
Emergency Medicine Provider Triage Evaluation Note  Cindy Garrison , a 50 y.o. female  with history of schizoaffective disorder was evaluated in triage.  Pt complains of abdominal pain x 2 days. [place] interpreter used in triage. Pain is in her general abdomen and is worsening. She states she has been taking pain medication prescribed to her from the hospital, unsure what medication this is.   Review of Systems  Positive: Abdominal pain Negative: Fevers, chills, nausea, vomiting, constipation, diarrhea  Physical Exam  BP 120/73   Pulse 98   Temp 98.3 F (36.8 C) (Oral)   Resp 16   LMP 09/12/2017 (LMP Unknown)   SpO2 100%  Gen:   Awake, no distress   Resp:  Normal effort  MSK:   Moves extremities without difficulty  Other:  Tenderness to palpation of epigastrium, no guarding or rebound, no CVA tenderness  Medical Decision Making  Medically screening exam initiated at 3:58 PM.  Appropriate orders placed.  Louis Matte Perryman was informed that the remainder of the evaluation will be completed by another provider, this initial triage assessment does not replace that evaluation, and the importance of remaining in the ED until their evaluation is complete.     Estill Cotta 09/19/20 1600    Wyvonnia Dusky, MD 09/19/20 [phone]

## 2020-09-19 NOTE — ED Notes (Signed)
Up to br iv almost infused

## 2020-09-19 NOTE — ED Triage Notes (Signed)
Pt bib ems for abd pain for 2 days, denies n/v. Pain radiates to her back. VSS

## 2020-09-19 NOTE — Discharge Instructions (Addendum)
Vous devrez suivre de prs votre OB/GYN. Veuillez appeler pour prendre rendez-vous ds que possible. Sur votre tomodensitomtrie, il y a des preuves d'un utrus largi, il se peut que vous ayez besoin d'une autre intervention/procdure gyncologique pour agir  ce sujet. C'est probablement ce qui cause [employer]. Veuillez prendre du Tylenol et de l'ibuprofne comme indiqu ci-dessous.  Veuillez utiliser du Tylenol ou de l'ibuprofne pour [employer]. Vous pouvez utiliser 600 mg d'ibuprofne toutes les 6 heures ou 1000 mg de Tylenol toutes les 6 heures. Vous pouvez choisir [employer] 2. Ce serait plus efficace. Ne pas dpasser 4 g de Tylenol dans les 24 heures. Ne pas dpasser 3200 mg d'ibuprofne 24 heures.  Je vous ai donn quelques comprims d'un analgsique plus fort  utiliser si c'est absolument ncessaire.      You will need to follow-up closely with your OB/GYN.  Please call to make an appointment as soon as possible. On your CT scan there is evidence of an enlarged uterus it may be that you need another gynecologic intervention/procedure to act on this.  This is likely what is causing her pain.  Please take Tylenol and ibuprofen as discussed below.  Please use Tylenol or ibuprofen for pain.  You may use 600 mg ibuprofen every 6 hours or 1000 mg of Tylenol every 6 hours.  You may choose to alternate between the 2.  This would be most effective.  Not to exceed 4 g of Tylenol within 24 hours.  Not to exceed 3200 mg ibuprofen 24 hours.  I have given you a few tablets of a stronger pain medicine to use if absolutely needed.

## 2020-09-19 NOTE — ED Provider Notes (Signed)
[facility] EMERGENCY DEPARTMENT Provider Note   CSN: [id number] Arrival date & time: 09/19/20  1546     History No chief complaint on file.   Cindy Garrison is a 50 y.o. female.  HPI  [place] language interpreter was used for the entirety of this visit  Patient is a French-speaking 50 year old female presented to the ER today with abdominal pain that is been ongoing since this morning.  She states that she is hurting all over her abdomen she states it is severe 10/10 constant.  She states it is worse along the lower abdomen.  She denies any vaginal discharge or bleeding.  She states that is been progressively worsening since this morning she denies any nausea or vomiting no vaginal irritation or vaginal bleeding.  She states that she has had "burning inside "a few years ago for vaginal bleeding.  She is unable to tell me who her OB/GYN was.  She denies any urinary frequency urgency or hematuria.  She denies any history of kidney stones.  Denies any lightheadedness or dizziness chest pain or shortness of breath.  No other significant associated symptoms.  She states that she has normal stool with no melena or hematochezia.      Past Medical History:  Diagnosis Date   #[id number]    Acne    Back pain    Essential hypertension    Seasonal allergies    Syncope 09/16/2014   Syncope and collapse 09/16/2014    Patient Active Problem List   Diagnosis Date Noted   Visit for routine gyn exam 04/09/2020   History of endometrial ablation 04/09/2020   Screening mammogram for breast cancer 04/09/2020   Cervical cancer screening 01/23/2020   Pure hypercholesterolemia 01/23/2020   Colon cancer screening 01/23/2020   Vitamin D deficiency 01/23/2020   Fibroid uterus 03/26/2015   Schizoaffective disorder ([place]) 09/16/2014   Essential hypertension    Allergic rhinitis 09/13/2006    Past Surgical History:  Procedure Laterality Date   DILITATION & CURRETTAGE/HYSTROSCOPY  WITH NOVASURE ABLATION N/A 12/22/2017   Procedure: DILATATION & CURETTAGE/ HYSTEROSCOPY WITH ENDOMETRIAL FIBROID AND  NOVASURE ABLATION;  Surgeon: Lavonia Drafts, MD;  Location: [facility];  Service: Gynecology;  Laterality: N/A;     OB History     Gravida  3   Para  0   Term  0   Preterm  0   AB  3   Living  0      SAB  3   IAB  0   Ectopic  0   Multiple  0   Live Births              No family history on file.  Social History   Tobacco Use   Smoking status: Never   Smokeless tobacco: Never  Vaping Use   Vaping Use: Never used  Substance Use Topics   Alcohol use: Not Currently    Comment: occ.   Drug use: No    Home Medications Prior to Admission medications   Medication Sig Start Date End Date Taking? Authorizing Provider  acetaminophen (TYLENOL) 500 MG tablet Take 500 mg by mouth every 6 (six) hours as needed for moderate pain.   Yes [provider]  amLODipine (NORVASC) 5 MG tablet TAKE 1 TABLET (5 MG TOTAL) BY MOUTH DAILY. Patient taking differently: Take 5 mg by mouth daily. 06/24/20 06/24/21 Yes Elsie Stain, MD  atorvastatin (LIPITOR) 20 MG tablet TAKE 1 TABLET (20 MG TOTAL)  BY MOUTH DAILY. TO LOWER CHOLESTEROL Patient taking differently: Take 20 mg by mouth daily. 06/24/20 06/24/21 Yes Elsie Stain, MD  haloperidol (HALDOL) 2 MG tablet TAKE 1 TABLET (2 MG TOTAL) BY MOUTH AT BEDTIME. Patient taking differently: Take 2 mg by mouth daily. 07/30/20 07/30/21 Yes Eulis Canner E, NP  naproxen (NAPROSYN) 375 MG tablet Take 1 tablet (375 mg total) by mouth 2 (two) times daily. Patient taking differently: Take 375 mg by mouth daily. 07/03/20  Yes Lamptey, Myrene Galas, MD  oxyCODONE (ROXICODONE) 5 MG immediate release tablet Take 1 tablet (5 mg total) by mouth every 8 (eight) hours as needed for up to 3 days for severe pain or breakthrough pain. 09/19/20 09/22/20 Yes Seleena Reimers S, PA  valsartan-hydrochlorothiazide (DIOVAN-HCT) 80-12.5 MG  tablet TAKE 1 TABLET BY MOUTH DAILY. 06/24/20 06/24/21 Yes Elsie Stain, MD  Vitamin D, Ergocalciferol, (DRISDOL) 1.25 MG (50000 UNIT) CAPS capsule TAKE 1 CAPSULE (50,000 UNITS TOTAL) BY MOUTH EVERY 7 (SEVEN) DAYS. Patient taking differently: Take 50,000 Units by mouth every 7 (seven) days. Wednesday 06/24/20 06/24/21 Yes Elsie Stain, MD  naproxen (NAPROSYN) 500 MG tablet Take 1 tablet (500 mg total) by mouth 2 (two) times daily as needed. Patient not taking: No sig reported 07/18/20   Volney American, PA-C  hydrochlorothiazide (HYDRODIURIL) 25 MG tablet Take 1 tablet (25 mg total) by mouth daily. 09/20/19 01/23/20  Charlott Rakes, MD  lisinopril (ZESTRIL) 40 MG tablet TAKE 1 TABLET (40 MG TOTAL) BY MOUTH DAILY. TO LOWER BLOOD PRESSURE 01/08/20 01/23/20  Charlott Rakes, MD  omeprazole (PRILOSEC) 40 MG capsule Take 1 capsule (40 mg total) by mouth 2 (two) times daily before a meal. 12/28/18 12/29/18  Antony Blackbird, MD    Allergies    Other  Review of Systems   Review of Systems  Constitutional:  Negative for chills and fever.  HENT:  Negative for congestion.   Eyes:  Negative for pain.  Respiratory:  Negative for cough and shortness of breath.   Cardiovascular:  Negative for chest pain and leg swelling.  Gastrointestinal:  Positive for abdominal pain. Negative for vomiting.  Genitourinary:  Negative for dysuria.  Musculoskeletal:  Negative for myalgias.  Skin:  Negative for rash.  Neurological:  Negative for dizziness and headaches.   Physical Exam Updated Vital Signs BP 113/72   Pulse 90   Temp 98.3 F (36.8 C) (Oral)   Resp 18   LMP 09/12/2017 (LMP Unknown)   SpO2 100%   Physical Exam Vitals and nursing note reviewed.  Constitutional:      General: She is not in acute distress. HENT:     Head: Normocephalic and atraumatic.     Nose: Nose normal.  Eyes:     General: No scleral icterus. Cardiovascular:     Rate and Rhythm: Normal rate and regular rhythm.     Pulses:  Normal pulses.     Heart sounds: Normal heart sounds.  Pulmonary:     Effort: Pulmonary effort is normal. No respiratory distress.     Breath sounds: No wheezing.  Abdominal:     Palpations: Abdomen is soft.     Tenderness: There is abdominal tenderness.     Comments: Abdomen is somewhat distended.  Tender to palpation diffusely.  Seems to be more tender in the lower abdomen and no focal abdominal tenderness.  No guarding or rebound.  Musculoskeletal:     Cervical back: Normal range of motion.     Right lower leg:  No edema.     Left lower leg: No edema.  Skin:    General: Skin is warm and dry.     Capillary Refill: Capillary refill takes less than 2 seconds.  Neurological:     Mental Status: She is alert. Mental status is at baseline.  Psychiatric:        Mood and Affect: Mood normal.        Behavior: Behavior normal.    ED Results / Procedures / Treatments   Labs (all labs ordered are listed, but only abnormal results are displayed) Labs Reviewed  COMPREHENSIVE METABOLIC PANEL - Abnormal; Notable for the following components:      Result Value   Sodium 133 (*)    Potassium 3.1 (*)    Glucose, Bld 182 (*)    All other components within normal limits  CBC - Abnormal; Notable for the following components:   WBC 11.3 (*)    RBC 5.15 (*)    MCV 74.0 (*)    All other components within normal limits  LIPASE, BLOOD  URINALYSIS, ROUTINE W REFLEX MICROSCOPIC  I-STAT BETA HCG BLOOD, ED ([facility], [facility], [facility] ONLY)    EKG None  Radiology CT ABDOMEN PELVIS W CONTRAST  Result Date: 09/19/2020 CLINICAL DATA:  Right lower quadrant abdominal pain and tenderness. EXAM: CT ABDOMEN AND PELVIS WITH CONTRAST TECHNIQUE: Multidetector CT imaging of the abdomen and pelvis was performed using the standard protocol following bolus administration of intravenous contrast. CONTRAST:  57m OMNIPAQUE IOHEXOL 350 MG/ML SOLN COMPARISON:  07/28/2018, sonogram 12/24/2019 FINDINGS: Lower chest: No acute  abnormality. Hepatobiliary: No focal liver abnormality is seen. No gallstones, gallbladder wall thickening, or biliary dilatation. Pancreas: Unremarkable Spleen: Unremarkable Adrenals/Urinary Tract: Adrenal glands are unremarkable. Kidneys are normal, without renal calculi, focal lesion, or hydronephrosis. Bladder is unremarkable. Stomach/Bowel: The stomach, small bowel, and large bowel are unremarkable. The appendix is normal. There is mild infiltration of the greater omentum with subtle nodularity identified, best appreciated on image # 46/3, as well as subtle thickening of the peritoneal lining, best appreciated within the left pericolic gutter. This is nonspecific, however, peritoneal inflammation or peritoneal carcinomatosis could appear in this fashion. No free intraperitoneal fluid. Vascular/Lymphatic: No significant vascular findings are present. No enlarged abdominal or pelvic lymph nodes. Reproductive: The uterus is markedly enlarged with marked distention of the endometrial cavity, progressive since prior examination. The uterus measures roughly 10.5 x 16.1 x 21.7 cm in greatest dimension. There is heterogeneous enhancement of the myometrium which may relate to multiple underlying uterine fibroids. There is bilateral hydrosalpinx noted. Subtle soft tissue infiltration asymmetrically within the left adnexa may be inflammatory or neoplastic in nature. No solid adnexal mass identified. Other: Tiny fat containing umbilical hernia. The rectum is unremarkable. Musculoskeletal: No acute bone abnormality. No lytic or blastic bone lesion. IMPRESSION: Marked enlargement of the uterus, progressive since prior examination, secondary to marked fluid distention of the endometrial cavity. Probable secondary bilateral hydrosalpinx. The findings suggest a obstructive process such as cervical mass or cervical stenosis. Mild infiltration of the peritoneum asymmetrically involving the left adnexa and left omentum may be  inflammatory or neoplastic in nature. Gynecologic consultation is recommended. Superimposed uterine fibroids noted. Normal appendix. Electronically Signed   By: AFidela SalisburyM.D.   On: 09/19/2020 21:51    Procedures Procedures   Medications Ordered in ED Medications  ketorolac (TORADOL) 30 MG/ML injection 30 mg (30 mg Intravenous Not Given 09/19/20 2322)  morphine 2 MG/ML injection 2 mg (2 mg  Intravenous Given 09/19/20 1839)  ondansetron (ZOFRAN) injection 4 mg (4 mg Intravenous Given 09/19/20 1836)  lactated ringers bolus 1,000 mL (0 mLs Intravenous Stopped 09/19/20 2015)  dicyclomine (BENTYL) capsule 20 mg (20 mg Oral Given 09/19/20 1838)  acetaminophen (TYLENOL) tablet 650 mg (650 mg Oral Given 09/19/20 1837)  iohexol (OMNIPAQUE) 350 MG/ML injection 80 mL (80 mLs Intravenous Contrast Given 09/19/20 2124)  potassium chloride SA (KLOR-CON) CR tablet 40 mEq (40 mEq Oral Given 09/19/20 2323)    ED Course  I have reviewed the triage vital signs and the nursing notes.  Pertinent labs & imaging results that were available during my care of the patient were reviewed by me and considered in my medical decision making (see chart for details).  Patient is a 50 year old French-speaking female with past medical history of D&C bipolar  She is presented today with abdominal pain she states that it started today although she earlier in triage told PA that her symptoms started longer ago.  [place] language interpreter was used for the entirety of my interview.  My physical exam is notable for some abdominal tenderness that seems diffuse no focal tenderness seems worse in the lower abdomen some abdominal distention.  She is otherwise well-appearing.  Vital signs within normal limits.  CMP notable for mild hypokalemia otherwise unremarkable CMP.  CBC with mild leukocytosis also mild erythrocytosis likely from dehydration/hemoconcentration.  I-STAT Hg negative for pregnancy.  Urinalysis unremarkable.  CT abdomen  pelvis obtained given patient's abdominal tenderness.   IMPRESSION:  Marked enlargement of the uterus, progressive since prior  examination, secondary to marked fluid distention of the endometrial  cavity. Probable secondary bilateral hydrosalpinx. The findings  suggest a obstructive process such as cervical mass or cervical  stenosis. Mild infiltration of the peritoneum asymmetrically  involving the left adnexa and left omentum may be inflammatory or  neoplastic in nature. Gynecologic consultation is recommended.     Superimposed uterine fibroids noted.     Normal appendix.    Clinical Course as of 09/19/20 2326  Fri Sep 19, 2020  2242 Discussed with Dr. Earvin Hansen of OB/GYN.  She recommends close follow-up with OB/GYN no intervention needed today. [WF]    Clinical Course User Index [WF] Tedd Sias, [place]   MDM Rules/Calculators/A&P                           Patient will follow up with OB/GYN.  OB/GYN on-call informed me that they we will expedite her follow-up process.  Final Clinical Impression(s) / ED Diagnoses Final diagnoses:  Lower abdominal pain    Rx / DC Orders ED Discharge Orders          Ordered    oxyCODONE (ROXICODONE) 5 MG immediate release tablet  Every 8 hours PRN        09/19/20 2252             Tedd Sias, [place] 09/19/20 2342    Dorie Rank, MD 09/20/20 [phone]

## 2020-09-24 ENCOUNTER — Other Ambulatory Visit: Payer: Self-pay

## 2020-09-24 DIAGNOSIS — R1084 Generalized abdominal pain: Secondary | ICD-10-CM

## 2020-09-24 MED ORDER — NAPROXEN 500 MG PO TABS
500.0000 mg | ORAL_TABLET | Freq: Two times a day (BID) | ORAL | 0 refills | Status: DC
  Filled 2020-09-24: qty 30, 15d supply, fill #0

## 2020-09-24 NOTE — Progress Notes (Addendum)
Patient walked into front office complaining of ongoing abdominal pain. [place] office requested RN speak with pt. Per chart review pt was last seen in ED for pain on 09/19/20. CT result from that day reviewed by Anyanwu, MD who states that findings are abnormal due to enlarged uterus, recommends patient follow up with Dr. Rip Harbour next available. Pt scheduled with Rip Harbour, MD for 10/13/20. Anyanwu gives verbal order for Naproxen 500 mg BID to help with pain relief until that appt. Reviewed with pt who is agreeable to plan.   No interpreter used for this encounter. Pt able to communicate in [place].  Apolonio Schneiders RN 09/24/20

## 2020-10-02 ENCOUNTER — Other Ambulatory Visit: Payer: Self-pay

## 2020-10-13 ENCOUNTER — Other Ambulatory Visit: Payer: Self-pay

## 2020-10-13 ENCOUNTER — Encounter: Payer: Self-pay | Admitting: Obstetrics and Gynecology

## 2020-10-13 ENCOUNTER — Ambulatory Visit (INDEPENDENT_AMBULATORY_CARE_PROVIDER_SITE_OTHER): Payer: Self-pay | Admitting: Obstetrics and Gynecology

## 2020-10-13 VITALS — BP 134/99 | HR 85 | Ht 61.0 in | Wt 148.6 lb

## 2020-10-13 DIAGNOSIS — D259 Leiomyoma of uterus, unspecified: Secondary | ICD-10-CM

## 2020-10-13 DIAGNOSIS — R1084 Generalized abdominal pain: Secondary | ICD-10-CM

## 2020-10-13 MED ORDER — NAPROXEN 500 MG PO TABS
500.0000 mg | ORAL_TABLET | Freq: Two times a day (BID) | ORAL | 2 refills | Status: DC
Start: 2020-10-13 — End: 2021-01-01
  Filled 2020-10-13: qty 30, 15d supply, fill #0

## 2020-10-13 NOTE — Progress Notes (Signed)
Still having pain on the left side of abdomen.

## 2020-10-13 NOTE — Progress Notes (Signed)
Cindy Garrison presents for follow up from recent ER Visit. Pt reports pain is better but she still has pain at times. Requesting refill of her Naprosyn, which helps with her pain. She has had no more bleeding for almost a yr now. Know uterine fibroids @ 20 weeks by exam.  PE AF VSS Lungs clear Heart RRR Abd soft + BS abd/pelvic mass effected noted as before, + uterine/fibroid tenderness  A/P Enlarged uterus consistent with uterine fibroids.  Reviewed again with pt that her pain is most likely related to her fibroids. Surgery has been recommended to pt. She has declined and declines again today. Naprosyn refilled for her today. F/U PRN

## 2020-10-14 ENCOUNTER — Ambulatory Visit: Payer: Self-pay | Admitting: Obstetrics and Gynecology

## 2020-10-15 ENCOUNTER — Other Ambulatory Visit: Payer: Self-pay

## 2020-10-16 ENCOUNTER — Other Ambulatory Visit: Payer: Self-pay

## 2020-10-17 ENCOUNTER — Encounter (HOSPITAL_COMMUNITY): Payer: Self-pay

## 2020-10-17 ENCOUNTER — Ambulatory Visit (HOSPITAL_COMMUNITY)
Admission: EM | Admit: 2020-10-17 | Discharge: 2020-10-17 | Disposition: A | Discharge status: Home / Self Care | Payer: Self-pay | Attending: Internal Medicine | Admitting: Internal Medicine

## 2020-10-17 ENCOUNTER — Other Ambulatory Visit: Payer: Self-pay

## 2020-10-17 ENCOUNTER — Other Ambulatory Visit: Payer: Self-pay | Admitting: Family Medicine

## 2020-10-17 DIAGNOSIS — R109 Unspecified abdominal pain: Secondary | ICD-10-CM

## 2020-10-17 DIAGNOSIS — D259 Leiomyoma of uterus, unspecified: Secondary | ICD-10-CM

## 2020-10-17 DIAGNOSIS — R102 Pelvic and perineal pain: Secondary | ICD-10-CM

## 2020-10-17 LAB — POCT URINALYSIS DIPSTICK, ED / UC
Bilirubin Urine: NEGATIVE
Glucose, UA: NEGATIVE mg/dL
Hgb urine dipstick: NEGATIVE
Ketones, ur: NEGATIVE mg/dL
Leukocytes,Ua: NEGATIVE
Nitrite: NEGATIVE
Protein, ur: NEGATIVE mg/dL
Specific Gravity, Urine: 1.015 (ref 1.005–1.030)
Urobilinogen, UA: 0.2 mg/dL (ref 0.0–1.0)
pH: 7 (ref 5.0–8.0)

## 2020-10-17 MED ORDER — NORETHINDRONE ACETATE 5 MG PO TABS: 5.0000 mg | ORAL_TABLET | Freq: Every day | ORAL | 0 refills | Status: DC

## 2020-10-17 MED ORDER — TRAMADOL HCL 50 MG PO TABS
50.0000 mg | ORAL_TABLET | Freq: Four times a day (QID) | ORAL | 0 refills | Status: DC | PRN
Start: 2020-10-17 — End: 2020-10-22

## 2020-10-17 MED ORDER — DOXYCYCLINE HYCLATE 100 MG PO CAPS: 100.0000 mg | ORAL_CAPSULE | Freq: Two times a day (BID) | ORAL | 0 refills | Status: DC

## 2020-10-17 NOTE — ED Provider Notes (Signed)
[facility]    CSN: [id number] Arrival date & time: 10/17/20  0806      History   Chief Complaint Chief Complaint  Patient presents with   Abdominal Pain    HPI Cindy Garrison is a 50 y.o. female.  Patient reports same abdominal pain that she has had for several weeks.  Was seen in the ER in the beginning of September for the same problem.  CT scan demonstrates she has significant uterine fibroids.  Followed up with GYN on 10/13/2020.  That note documents patient told surgery was the solution to her problem but patient declined.  Patient is here today saying that the pain medicine she has been prescribed, naproxen, is not working and she requests different medicine.  She reports she received a prescription from the ER that helped relieve symptoms better than the naproxen.  Symptoms are completely unchanged from prior.  This visit conducted with the assistance of a [place] interpreter via video.   Abdominal Pain  Past Medical History:  Diagnosis Date   #[id number]    Acne    Back pain    Essential hypertension    Seasonal allergies    Syncope 09/16/2014   Syncope and collapse 09/16/2014    Patient Active Problem List   Diagnosis Date Noted   Visit for routine gyn exam 04/09/2020   History of endometrial ablation 04/09/2020   Screening mammogram for breast cancer 04/09/2020   Cervical cancer screening 01/23/2020   Pure hypercholesterolemia 01/23/2020   Colon cancer screening 01/23/2020   Vitamin D deficiency 01/23/2020   Fibroid uterus 03/26/2015   Schizoaffective disorder ([place]) 09/16/2014   Essential hypertension    Allergic rhinitis 09/13/2006    Past Surgical History:  Procedure Laterality Date   DILITATION & CURRETTAGE/HYSTROSCOPY WITH NOVASURE ABLATION N/A 12/22/2017   Procedure: DILATATION & CURETTAGE/ HYSTEROSCOPY WITH ENDOMETRIAL FIBROID AND  NOVASURE ABLATION;  Surgeon: Lavonia Drafts, MD;  Location: [facility];  Service: Gynecology;  Laterality:  N/A;    OB History     Gravida  3   Para  0   Term  0   Preterm  0   AB  3   Living  0      SAB  3   IAB  0   Ectopic  0   Multiple  0   Live Births               Home Medications    Prior to Admission medications   Medication Sig Start Date End Date Taking? Authorizing Provider  acetaminophen (TYLENOL) 500 MG tablet Take 500 mg by mouth every 6 (six) hours as needed for moderate pain.    [provider]  amLODipine (NORVASC) 5 MG tablet TAKE 1 TABLET (5 MG TOTAL) BY MOUTH DAILY. Patient taking differently: Take 5 mg by mouth daily. 06/24/20 06/24/21  Elsie Stain, MD  atorvastatin (LIPITOR) 20 MG tablet TAKE 1 TABLET (20 MG TOTAL) BY MOUTH DAILY. TO LOWER CHOLESTEROL Patient taking differently: Take 20 mg by mouth daily. 06/24/20 06/24/21  Elsie Stain, MD  haloperidol (HALDOL) 2 MG tablet TAKE 1 TABLET (2 MG TOTAL) BY MOUTH AT BEDTIME. Patient taking differently: Take 2 mg by mouth daily. 07/30/20 07/30/21  Salley Slaughter, NP  naproxen (NAPROSYN) 500 MG tablet Take 1 tablet (500 mg total) by mouth 2 (two) times daily with a meal. 10/13/20   Chancy Milroy, MD  valsartan-hydrochlorothiazide (DIOVAN-HCT) 80-12.5 MG tablet TAKE 1 TABLET BY  MOUTH DAILY. 06/24/20 06/24/21  Elsie Stain, MD  Vitamin D, Ergocalciferol, (DRISDOL) 1.25 MG (50000 UNIT) CAPS capsule TAKE 1 CAPSULE (50,000 UNITS TOTAL) BY MOUTH EVERY 7 (SEVEN) DAYS. Patient taking differently: Take 50,000 Units by mouth every 7 (seven) days. Wednesday 06/24/20 06/24/21  Elsie Stain, MD  hydrochlorothiazide (HYDRODIURIL) 25 MG tablet Take 1 tablet (25 mg total) by mouth daily. 09/20/19 01/23/20  Charlott Rakes, MD  lisinopril (ZESTRIL) 40 MG tablet TAKE 1 TABLET (40 MG TOTAL) BY MOUTH DAILY. TO LOWER BLOOD PRESSURE 01/08/20 01/23/20  Charlott Rakes, MD  omeprazole (PRILOSEC) 40 MG capsule Take 1 capsule (40 mg total) by mouth 2 (two) times daily before a meal. 12/28/18 12/29/18  Antony Blackbird, MD     Family History History reviewed. No pertinent family history.  Social History Social History   Tobacco Use   Smoking status: Never   Smokeless tobacco: Never  Vaping Use   Vaping Use: Never used  Substance Use Topics   Alcohol use: Not Currently    Comment: occ.   Drug use: No     Allergies   Other   Review of Systems Review of Systems  Gastrointestinal:  Positive for abdominal pain.    Physical Exam Triage Vital Signs ED Triage Vitals  Enc Vitals Group     BP 10/17/20 0824 126/82     Pulse Rate 10/17/20 0824 (!) 102     Resp 10/17/20 0824 17     Temp 10/17/20 0824 98.1 F (36.7 C)     Temp Source 10/17/20 0824 Oral     SpO2 10/17/20 0824 98 %     Weight --      Height --      Head Circumference --      Peak Flow --      Pain Score 10/17/20 0822 10     Pain Loc --      Pain Edu? --      Excl. in [place]? --    No data found.  Updated Vital Signs BP 126/82 (BP Location: Left Arm)   Pulse (!) 102   Temp 98.1 F (36.7 C) (Oral)   Resp 17   LMP 09/12/2017 (LMP Unknown)   SpO2 98%   Visual Acuity Right Eye Distance:   Left Eye Distance:   Bilateral Distance:    Right Eye Near:   Left Eye Near:    Bilateral Near:     Physical Exam Constitutional:      Appearance: She is well-developed. She is not ill-appearing.  Pulmonary:     Effort: Pulmonary effort is normal.  Neurological:     Mental Status: She is alert.     Gait: Gait normal.     UC Treatments / Results  Labs (all labs ordered are listed, but only abnormal results are displayed) Labs Reviewed  POCT URINALYSIS DIPSTICK, ED / UC    EKG   Radiology No results found.  Procedures Procedures (including critical care time)  Medications Ordered in UC Medications - No data to display  Initial Impression / Assessment and Plan / UC Course  I have reviewed the triage vital signs and the nursing notes.  Pertinent labs & imaging results that were available during my care of the  patient were reviewed by me and considered in my medical decision making (see chart for details).  I spent approximately 25 minutes with patient and interpreter discussing fibroids and treatment for fibroids.  At this time, patient reports she is  interested in surgery but does not have the money for the surgery.  Patient does not have insurance.  I will route this back to the gynecologist, Arlina Robes, as she is now interested in surgery.  Patient is also to follow-up with her primary care provider at the [facility] as well.  Review of ER record shows that she was prescribed oxycodone by the ER.  I explained I am not able to do prescribe more and the only medicine we can provide her is the naproxen for which she already has a prescription.   Final Clinical Impressions(s) / UC Diagnoses   Final diagnoses:  Abdominal pain, unspecified abdominal location     Discharge Instructions      Call [facility] to try and get an appointment before November.    ED Prescriptions   None    PDMP not reviewed this encounter.   Carvel Getting, NP 10/17/20 [phone]

## 2020-10-17 NOTE — ED Triage Notes (Signed)
Pt presents with abdominal pain X 3 weeks. Pt states she was prescribed Naproxen by her PCP. States it has not helped her.

## 2020-10-17 NOTE — Progress Notes (Signed)
Here with significant pain. Has previously declined surgery, but wants it now. Naprosyn is not helping. Will add tramadol today and aygestin to stabilize her fibroid.

## 2020-10-17 NOTE — Discharge Instructions (Signed)
Call [facility] to try and get an appointment before November.

## 2020-10-21 ENCOUNTER — Encounter (HOSPITAL_COMMUNITY): Payer: Self-pay

## 2020-10-21 ENCOUNTER — Emergency Department (HOSPITAL_COMMUNITY)
Admission: EM | Admit: 2020-10-21 | Discharge: 2020-10-22 | Disposition: A | Discharge status: Home / Self Care | Payer: Self-pay | Attending: Emergency Medicine | Admitting: Emergency Medicine

## 2020-10-21 ENCOUNTER — Other Ambulatory Visit: Payer: Self-pay

## 2020-10-21 ENCOUNTER — Emergency Department (HOSPITAL_COMMUNITY): Payer: Self-pay

## 2020-10-21 DIAGNOSIS — N83202 Unspecified ovarian cyst, left side: Secondary | ICD-10-CM | POA: Insufficient documentation

## 2020-10-21 DIAGNOSIS — I10 Essential (primary) hypertension: Secondary | ICD-10-CM | POA: Insufficient documentation

## 2020-10-21 DIAGNOSIS — E669 Obesity, unspecified: Secondary | ICD-10-CM | POA: Insufficient documentation

## 2020-10-21 DIAGNOSIS — R21 Rash and other nonspecific skin eruption: Secondary | ICD-10-CM | POA: Insufficient documentation

## 2020-10-21 DIAGNOSIS — R935 Abnormal findings on diagnostic imaging of other abdominal regions, including retroperitoneum: Secondary | ICD-10-CM

## 2020-10-21 DIAGNOSIS — Z79899 Other long term (current) drug therapy: Secondary | ICD-10-CM | POA: Insufficient documentation

## 2020-10-21 DIAGNOSIS — M549 Dorsalgia, unspecified: Secondary | ICD-10-CM

## 2020-10-21 DIAGNOSIS — R1031 Right lower quadrant pain: Secondary | ICD-10-CM

## 2020-10-21 DIAGNOSIS — R1030 Lower abdominal pain, unspecified: Secondary | ICD-10-CM

## 2020-10-21 DIAGNOSIS — R9389 Abnormal findings on diagnostic imaging of other specified body structures: Secondary | ICD-10-CM | POA: Insufficient documentation

## 2020-10-21 DIAGNOSIS — N9489 Other specified conditions associated with female genital organs and menstrual cycle: Secondary | ICD-10-CM | POA: Insufficient documentation

## 2020-10-21 DIAGNOSIS — N83209 Unspecified ovarian cyst, unspecified side: Secondary | ICD-10-CM

## 2020-10-21 LAB — CBC WITH DIFFERENTIAL/PLATELET
Abs Immature Granulocytes: 0.03 10*3/uL (ref 0.00–0.07)
Basophils Absolute: 0 10*3/uL (ref 0.0–0.1)
Basophils Relative: 0 %
Eosinophils Absolute: 0.3 10*3/uL (ref 0.0–0.5)
Eosinophils Relative: 3 %
HCT: 33.6 % — ABNORMAL LOW (ref 36.0–46.0)
Hemoglobin: 12 g/dL (ref 12.0–15.0)
Immature Granulocytes: 0 %
Lymphocytes Relative: 21 %
Lymphs Abs: 1.6 10*3/uL (ref 0.7–4.0)
MCH: 26 pg (ref 26.0–34.0)
MCHC: 35.7 g/dL (ref 30.0–36.0)
MCV: 72.7 fL — ABNORMAL LOW (ref 80.0–100.0)
Monocytes Absolute: 0.9 10*3/uL (ref 0.1–1.0)
Monocytes Relative: 11 %
Neutro Abs: 4.8 10*3/uL (ref 1.7–7.7)
Neutrophils Relative %: 65 %
Platelets: 284 10*3/uL (ref 150–400)
RBC: 4.62 MIL/uL (ref 3.87–5.11)
RDW: 13.1 % (ref 11.5–15.5)
WBC: 7.5 10*3/uL (ref 4.0–10.5)
nRBC: 0 % (ref 0.0–0.2)

## 2020-10-21 LAB — COMPREHENSIVE METABOLIC PANEL
ALT: 14 U/L (ref 0–44)
AST: 15 U/L (ref 15–41)
Albumin: 3.4 g/dL — ABNORMAL LOW (ref 3.5–5.0)
Alkaline Phosphatase: 44 U/L (ref 38–126)
Anion gap: 9 (ref 5–15)
BUN: 8 mg/dL (ref 6–20)
CO2: 28 mmol/L (ref 22–32)
Calcium: 9.2 mg/dL (ref 8.9–10.3)
Chloride: 97 mmol/L — ABNORMAL LOW (ref 98–111)
Creatinine, Ser: 0.57 mg/dL (ref 0.44–1.00)
GFR, Estimated: >60 mL/min (ref >60)
Glucose, Bld: 101 mg/dL — ABNORMAL HIGH (ref 70–99)
Potassium: 3.4 mmol/L — ABNORMAL LOW (ref 3.5–5.1)
Sodium: 134 mmol/L — ABNORMAL LOW (ref 135–145)
Total Bilirubin: 0.6 mg/dL (ref 0.3–1.2)
Total Protein: 6.9 g/dL (ref 6.5–8.1)

## 2020-10-21 LAB — URINALYSIS, ROUTINE W REFLEX MICROSCOPIC
Bilirubin Urine: NEGATIVE
Glucose, UA: NEGATIVE mg/dL
Hgb urine dipstick: NEGATIVE
Ketones, ur: NEGATIVE mg/dL
Leukocytes,Ua: NEGATIVE
Nitrite: NEGATIVE
Protein, ur: NEGATIVE mg/dL
Specific Gravity, Urine: 1.016 (ref 1.005–1.030)
pH: 7 (ref 5.0–8.0)

## 2020-10-21 LAB — I-STAT BETA HCG BLOOD, ED (MC, WL, AP ONLY): I-stat hCG, quantitative: <5 m[IU]/mL (ref <5)

## 2020-10-21 NOTE — ED Provider Notes (Signed)
Emergency Medicine Provider Triage Evaluation Note  Cindy Garrison , a 50 y.o. female  was evaluated in triage.  Pt was evaluated with the interpreter.  She reports continued abdominal pain and "an allergy in her back and bottom."  This has been going on for "a long time.".  Review of Systems  Positive: Abd/back pain Negative: NVDC, fevers, chills  Physical Exam  BP 110/70   Pulse 90   Temp 98.3 F (36.8 C) (Oral)   Resp 18   LMP 09/12/2017 (LMP Unknown)   SpO2 100%  Gen:   Awake, no distress   Resp:  Normal effort  MSK:   Moves extremities without difficulty  Other:  Firm lower abdomen, RLQ pain  Medical Decision Making  Medically screening exam initiated at 1:28 PM.  Appropriate orders placed.  Cindy Garrison was informed that the remainder of the evaluation will be completed by another provider, this initial triage assessment does not replace that evaluation, and the importance of remaining in the ED until their evaluation is complete.  Seen by OB/GYN who recommended hysterectomy due to enlargement of her uterus.  Naproxen no longer working   Cindy Garrison, [employer], PA-C 10/21/20 [place], [place], DO 10/21/20 1427

## 2020-10-21 NOTE — ED Triage Notes (Signed)
[place] interpreter used for triage: Pt reports lower abd pain for a few weeks now, seen here for the same, given naproxen without relief. Pt thinks she is allergic to the naproxen due to itching in her lower back and buttocks. Pt followed up with OBGYN due to fibroids and they recommended surgery to remove her uterus but pt declined.

## 2020-10-22 ENCOUNTER — Other Ambulatory Visit (HOSPITAL_COMMUNITY): Payer: Self-pay

## 2020-10-22 ENCOUNTER — Telehealth (HOSPITAL_COMMUNITY): Payer: Self-pay | Admitting: Emergency Medicine

## 2020-10-22 ENCOUNTER — Other Ambulatory Visit: Payer: Self-pay

## 2020-10-22 ENCOUNTER — Telehealth: Payer: Self-pay | Admitting: *Deleted

## 2020-10-22 MED ORDER — NYSTATIN 100000 UNIT/GM EX CREA
TOPICAL_CREAM | CUTANEOUS | 0 refills | Status: DC
  Filled 2020-10-22: qty 30, 30d supply, fill #0

## 2020-10-22 MED ORDER — OXYCODONE-ACETAMINOPHEN 5-325 MG PO TABS
1.0000 | ORAL_TABLET | Freq: Once | ORAL | Status: AC
  Administered 2020-10-22: 1 via ORAL
  Filled 2020-10-22: qty 1

## 2020-10-22 MED ORDER — OXYCODONE-ACETAMINOPHEN 5-325 MG PO TABS
1.0000 | ORAL_TABLET | Freq: Four times a day (QID) | ORAL | 0 refills | Status: DC | PRN
  Filled 2020-10-22: qty 10, 3d supply, fill #0

## 2020-10-22 MED ORDER — OXYCODONE-ACETAMINOPHEN 5-325 MG PO TABS
1.0000 | ORAL_TABLET | Freq: Four times a day (QID) | ORAL | 0 refills | Status: DC | PRN
  Filled 2020-10-22: qty 15, 4d supply, fill #0

## 2020-10-22 NOTE — Telephone Encounter (Signed)
Med sent to different pharmacy

## 2020-10-22 NOTE — Discharge Instructions (Addendum)
You were seen today for ongoing abdominal pain.  Your ultrasound not only shows an enlarged uterus but also an ovarian cyst that may be concerning.  It is very important that you follow-up with OB/GYN.  You will be given a short course of Percocet.  Do not take this with tramadol.  He also were noted to have a rash.  This seems consistent with yeast.  Apply cream twice daily.

## 2020-10-22 NOTE — ED Provider Notes (Signed)
[facility] Provider Note   CSN: [id number] Arrival date & time: 10/21/20  1251     History Chief Complaint  Patient presents with   Abdominal Pain    Cindy Garrison is a 50 y.o. female.  HPI     This a 50 year old female with a history of uterine fibroids, syncope, hypertension who presents with ongoing abdominal pain.  She has been seen and evaluated multiple times both in the ED and by her OB/GYN for pain.  She has known uterine fibroids.  She reports persistent ongoing pain.  She transition from taking naproxen to taking tramadol on Friday.  She states she continues to have some pain.  She believes the naproxen made her have an allergic reaction although she had previously tolerated it.  She describes having a rash at the cleft of her buttocks.  It is itchy in nature.  Patient describes her abdominal pain as bilateral lower abdominal pain that is dull and nonradiating.  It is currently 4 out of 10.  Nothing seems to make it better or worse.  No urinary symptoms or fevers.  I have reviewed her chart.  Previously offered surgery for significantly enlarged uterus with fibroid.  Up until recently she declined surgery.  She had imaging and was seen in the ED in early September which confirmed enlarged fibroid uterus.  Past Medical History:  Diagnosis Date   #[id number]    Acne    Back pain    Essential hypertension    Seasonal allergies    Syncope 09/16/2014   Syncope and collapse 09/16/2014    Patient Active Problem List   Diagnosis Date Noted   Visit for routine gyn exam 04/09/2020   History of endometrial ablation 04/09/2020   Screening mammogram for breast cancer 04/09/2020   Cervical cancer screening 01/23/2020   Pure hypercholesterolemia 01/23/2020   Colon cancer screening 01/23/2020   Vitamin D deficiency 01/23/2020   Fibroid uterus 03/26/2015   Schizoaffective disorder ([place]) 09/16/2014   Essential hypertension    Allergic  rhinitis 09/13/2006    Past Surgical History:  Procedure Laterality Date   DILITATION & CURRETTAGE/HYSTROSCOPY WITH NOVASURE ABLATION N/A 12/22/2017   Procedure: DILATATION & CURETTAGE/ HYSTEROSCOPY WITH ENDOMETRIAL FIBROID AND  NOVASURE ABLATION;  Surgeon: Lavonia Drafts, MD;  Location: [facility];  Service: Gynecology;  Laterality: N/A;     OB History     Gravida  3   Para  0   Term  0   Preterm  0   AB  3   Living  0      SAB  3   IAB  0   Ectopic  0   Multiple  0   Live Births              History reviewed. No pertinent family history.  Social History   Tobacco Use   Smoking status: Never   Smokeless tobacco: Never  Vaping Use   Vaping Use: Never used  Substance Use Topics   Alcohol use: Not Currently    Comment: occ.   Drug use: No    Home Medications Prior to Admission medications   Medication Sig Start Date End Date Taking? Authorizing Provider  nystatin cream (MYCOSTATIN) Apply to affected area 2 times daily 10/22/20  Yes Tanaia Hawkey, Barbette Hair, MD  oxyCODONE-acetaminophen (PERCOCET/ROXICET) 5-325 MG tablet Take 1 tablet by mouth every 6 (six) hours as needed for severe pain. 10/22/20  Yes Cylah Fannin, Barbette Hair, MD  acetaminophen (TYLENOL) 500 MG tablet Take 500 mg by mouth every 6 (six) hours as needed for moderate pain.    [provider]  amLODipine (NORVASC) 5 MG tablet TAKE 1 TABLET (5 MG TOTAL) BY MOUTH DAILY. Patient taking differently: Take 5 mg by mouth daily. 06/24/20 06/24/21  Elsie Stain, MD  atorvastatin (LIPITOR) 20 MG tablet TAKE 1 TABLET (20 MG TOTAL) BY MOUTH DAILY. TO LOWER CHOLESTEROL Patient taking differently: Take 20 mg by mouth daily. 06/24/20 06/24/21  Elsie Stain, MD  doxycycline (VIBRAMYCIN) 100 MG capsule Take 1 capsule (100 mg total) by mouth 2 (two) times daily. 10/17/20   Donnamae Jude, MD  haloperidol (HALDOL) 2 MG tablet TAKE 1 TABLET (2 MG TOTAL) BY MOUTH AT BEDTIME. Patient taking differently: Take 2  mg by mouth daily. 07/30/20 07/30/21  Salley Slaughter, NP  naproxen (NAPROSYN) 500 MG tablet Take 1 tablet (500 mg total) by mouth 2 (two) times daily with a meal. 10/13/20   Chancy Milroy, MD  norethindrone (AYGESTIN) 5 MG tablet Take 1 tablet (5 mg total) by mouth daily. 10/17/20   Donnamae Jude, MD  traMADol (ULTRAM) 50 MG tablet Take 1 tablet (50 mg total) by mouth every 6 (six) hours as needed. 10/17/20   Donnamae Jude, MD  valsartan-hydrochlorothiazide (DIOVAN-HCT) 80-12.5 MG tablet TAKE 1 TABLET BY MOUTH DAILY. 06/24/20 06/24/21  Elsie Stain, MD  Vitamin D, Ergocalciferol, (DRISDOL) 1.25 MG (50000 UNIT) CAPS capsule TAKE 1 CAPSULE (50,000 UNITS TOTAL) BY MOUTH EVERY 7 (SEVEN) DAYS. Patient taking differently: Take 50,000 Units by mouth every 7 (seven) days. Wednesday 06/24/20 06/24/21  Elsie Stain, MD  hydrochlorothiazide (HYDRODIURIL) 25 MG tablet Take 1 tablet (25 mg total) by mouth daily. 09/20/19 01/23/20  Charlott Rakes, MD  lisinopril (ZESTRIL) 40 MG tablet TAKE 1 TABLET (40 MG TOTAL) BY MOUTH DAILY. TO LOWER BLOOD PRESSURE 01/08/20 01/23/20  Charlott Rakes, MD  omeprazole (PRILOSEC) 40 MG capsule Take 1 capsule (40 mg total) by mouth 2 (two) times daily before a meal. 12/28/18 12/29/18  Antony Blackbird, MD    Allergies    Other  Review of Systems   Review of Systems  Constitutional:  Negative for fever.  Respiratory:  Negative for shortness of breath.   Cardiovascular:  Negative for chest pain.  Gastrointestinal:  Positive for abdominal pain. Negative for nausea and vomiting.  Genitourinary:  Negative for dysuria.  Skin:  Positive for rash.  All other systems reviewed and are negative.  Physical Exam Updated Vital Signs BP 124/73   Pulse 88   Temp 98.3 F (36.8 C) (Oral)   Resp 16   LMP 09/12/2017 (LMP Unknown)   SpO2 98%   Physical Exam Vitals and nursing note reviewed.  Constitutional:      Appearance: She is well-developed. She is obese. She is not  ill-appearing.  HENT:     Head: Normocephalic and atraumatic.  Eyes:     Pupils: Pupils are equal, round, and reactive to light.  Cardiovascular:     Rate and Rhythm: Normal rate and regular rhythm.  Pulmonary:     Effort: Pulmonary effort is normal. No respiratory distress.  Abdominal:     General: Bowel sounds are normal.     Palpations: Abdomen is soft.     Tenderness: There is generalized abdominal tenderness. There is no guarding or rebound.  Genitourinary:    Comments: Deferred Musculoskeletal:     Cervical back: Neck supple.  Skin:  General: Skin is warm and dry.     Comments: Rash with satellite lesions noted in the gluteal cleft no significant erythema  Neurological:     Mental Status: She is alert and oriented to person, place, and time.  Psychiatric:        Mood and Affect: Mood normal.    ED Results / Procedures / Treatments   Labs (all labs ordered are listed, but only abnormal results are displayed) Labs Reviewed  COMPREHENSIVE METABOLIC PANEL - Abnormal; Notable for the following components:      Result Value   Sodium 134 (*)    Potassium 3.4 (*)    Chloride 97 (*)    Glucose, Bld 101 (*)    Albumin 3.4 (*)    All other components within normal limits  CBC WITH DIFFERENTIAL/PLATELET - Abnormal; Notable for the following components:   HCT 33.6 (*)    MCV 72.7 (*)    All other components within normal limits  URINALYSIS, ROUTINE W REFLEX MICROSCOPIC  I-STAT BETA HCG BLOOD, ED ([facility], [facility], [facility] ONLY)    EKG None  Radiology US PELVIC COMPLETE WITH TRANSVAGINAL  Result Date: 10/21/2020 CLINICAL DATA:  Abdominal pain and back pain for 1 month. Patient is postmenopausal. EXAM: TRANSABDOMINAL AND TRANSVAGINAL ULTRASOUND OF PELVIS TECHNIQUE: Both transabdominal and transvaginal ultrasound examinations of the pelvis were performed. Transabdominal technique was performed for global imaging of the pelvis including uterus, ovaries, adnexal regions, and pelvic  cul-de-sac. It was necessary to proceed with endovaginal exam following the transabdominal exam to visualize the endometrium. COMPARISON:  CT abdomen pelvis 09/19/2020 FINDINGS: Uterus Measurements: 22 x 10.6 x 18.7 cm = volume: 131 mL. Enlarged uterus noted reaching above the umbilicus. Endometrium Thickness: Heterogeneous and thickened with fluid. Associated septations. Measures up to 10 cm. Right ovary Measurements: 4.6 x 3 x 4.2 cm = volume: 30 mL. Only seen on transabdominal images. Simple cystic lesion measuring up to 4.1 x 1.7 x 3.9 cm. Left ovary Measurements: 6.3 x 4.5 x 5.6 cm = volume: 82 mL. Only seen on transabdominal images. Cystic lesion measuring up to 5.3 x 2.7 x 4.9 cm with associated thick septation with vascularity. Another cystic lesion measuring 4.1 x 2.9 x 3.5 cm within associated thin septation but no definite vascularity. Other findings No abnormal free fluid. IMPRESSION: 1. Heterogeneous and markedly thickened endometrium with fluid (up to 10 cm). Associated enlarged uterus. Recommend gynecologic consultation. 2. Left ovarian cystic lesion measuring up to 5.3 cm with associated thick vascular septation/mural nodularity concerning for malignancy. Recommend gynecologic consultation. 3. Please note limited evaluation of the ovaries as these are only visualized on transabdominal view. Electronically Signed   By: Iven Finn M.D.   On: 10/21/2020 15:54    Procedures Procedures   Medications Ordered in ED Medications  oxyCODONE-acetaminophen (PERCOCET/ROXICET) 5-325 MG per tablet 1 tablet (has no administration in time range)    ED Course  I have reviewed the triage vital signs and the nursing notes.  Pertinent labs & imaging results that were available during my care of the patient were reviewed by me and considered in my medical decision making (see chart for details).    MDM Rules/Calculators/A&P                           Patient presents with ongoing abdominal pain.   Subacute nature.  Multiple evaluations and has been recommended for surgery.  I have reviewed her triage lab work  and imaging.  She had an ultrasound that is concerning for a newly thickened endometrium and a septated ovarian cyst that is concerning for malignancy.  This is new when compared to prior.  I stressed to the patient that it is very important that she follow-up with OB/GYN and surgery is likely the next step.  Tramadol is not working for her.  I will provide her with a short course of Percocet.  Narcotic database is not available at the time of this dictation; however, have low suspicion for abuse at this time.  Additionally, she had a rash.  Do not feel this is related to naproxen.  It appears consistent with yeast.  It does not appear superinfected.  She is currently on doxycycline.  This would cover any skin infection.  We will add nystatin cream.  After history, exam, and medical workup I feel the patient has been appropriately medically screened and is safe for discharge home. Pertinent diagnoses were discussed with the patient. Patient was given return precautions.  Final Clinical Impression(s) / ED Diagnoses Final diagnoses:  Abnormal ultrasound of endometrium  Cyst of ovary, unspecified laterality  Lower abdominal pain  Rash    Rx / DC Orders ED Discharge Orders          Ordered    oxyCODONE-acetaminophen (PERCOCET/ROXICET) 5-325 MG tablet  Every 6 hours PRN        10/22/20 0355    nystatin cream (MYCOSTATIN)        10/22/20 0355             Merryl Hacker, MD 10/22/20 [phone]

## 2020-10-22 NOTE — Telephone Encounter (Signed)
Transitions of Care Pharmacy called regarding pt showing up from [facility] to have narcotic filled.  RNCM consulted with Dr Melina Copa to resend Rx to [facility].

## 2020-10-23 ENCOUNTER — Other Ambulatory Visit (INDEPENDENT_AMBULATORY_CARE_PROVIDER_SITE_OTHER): Payer: Self-pay | Admitting: Obstetrics and Gynecology

## 2020-10-23 DIAGNOSIS — D259 Leiomyoma of uterus, unspecified: Secondary | ICD-10-CM

## 2020-10-23 DIAGNOSIS — N838 Other noninflammatory disorders of ovary, fallopian tube and broad ligament: Secondary | ICD-10-CM

## 2020-10-23 NOTE — Progress Notes (Signed)
Orders placed for MR of pelvis along with CA 125 and CEA, and ROMA 1 for ovarian CA screening.

## 2020-10-27 ENCOUNTER — Telehealth: Payer: Self-pay

## 2020-10-27 NOTE — Telephone Encounter (Signed)
-----   Message from Griffin Basil, MD sent at 10/23/2020  3:55 PM EDT ----- Regarding: previsit testing This patient is seeing me I think on 11/10/20.  If possible, she needs some labwork which is preordered by me before I see her.  She needs to be scheduled for an MRI of the pelvis hopefully before I see her.  Contact me if you have any questions.  Elgie Congo, MD

## 2020-10-27 NOTE — Telephone Encounter (Signed)
Call placed to pt with Cindy Garrison # [phone] with [name]  Pt  given recommendations per Dr Elgie Congo. Pt verbalized understanding. Pt scheduled for 10/11 at 10am for labwork.  Pt also prefers MRI in the morning. Orders placed by Dr Elgie Congo. Will send [name] message for MRI PA.  [name], RN

## 2020-10-28 ENCOUNTER — Other Ambulatory Visit: Payer: Self-pay

## 2020-10-28 DIAGNOSIS — N838 Other noninflammatory disorders of ovary, fallopian tube and broad ligament: Secondary | ICD-10-CM

## 2020-10-29 LAB — CA 125: Cancer Antigen (CA) 125: 63.1 U/mL — ABNORMAL HIGH (ref 0.0–38.1)

## 2020-10-29 LAB — CEA: CEA: 1.2 ng/mL (ref 0.0–4.7)

## 2020-10-30 ENCOUNTER — Other Ambulatory Visit: Payer: Self-pay

## 2020-10-30 ENCOUNTER — Encounter (HOSPITAL_COMMUNITY): Payer: Self-pay | Admitting: Psychiatry

## 2020-10-30 ENCOUNTER — Telehealth (INDEPENDENT_AMBULATORY_CARE_PROVIDER_SITE_OTHER): Payer: No Payment, Other | Admitting: Psychiatry

## 2020-10-30 DIAGNOSIS — F259 Schizoaffective disorder, unspecified: Secondary | ICD-10-CM | POA: Diagnosis not present

## 2020-10-30 MED ORDER — HALOPERIDOL 2 MG PO TABS
ORAL_TABLET | ORAL | 3 refills | Status: DC
Start: 2020-10-30 — End: 2021-01-27
  Filled 2020-10-30: qty 30, fill #0
  Filled 2020-12-03: qty 30, 30d supply, fill #0
  Filled 2021-01-06: qty 30, 30d supply, fill #1

## 2020-10-30 NOTE — Progress Notes (Signed)
[place] MD/PA/NP OP Progress Note Virtual Visit via Telephone Note  I connected with Cindy Garrison on 10/30/20 at  9:00 AM EDT by telephone and verified that I am speaking with the correct person using two identifiers.  Location: Patient: home Provider: Clinic   I discussed the limitations, risks, security and privacy concerns of performing an evaluation and management service by telephone and the availability of in person appointments. I also discussed with the patient that there may be a patient responsible charge related to this service. The patient expressed understanding and agreed to proceed.   I provided 30 minutes of non-face-to-face time during this encounter.  10/30/2020 8:51 AM Cindy Garrison  MRN:  [id number]  Chief Complaint: "Im in pain"    HPI: 50 year old female seen today for f/u psychiatric evaluation. She haas a psychiatric history of schizoaffective disorder. She is currently managed on Haldol 2mg  QD. Patient notes that her medication regimen has continued to be effective.  Today patient exam was done over the phone. During exam she is pleasant, cooperative, engaged in conversation.  She informed Probation officer that she continues to have pain. Recently she was seen at [facility] where she presented with abdominal pain. She notes her pain medications are someehat effective. Mentally she is doing well and denies symptoms of anxiety or depression. She also denies SI/HI/VAH, paranoia, mania. She endorses restful sleep and appetite.  No medication changes made today.  Patient agreeable to continue medications as prescribed.    Visit Diagnosis:    ICD-10-CM   1. Schizoaffective disorder, unspecified type ([place])  F25.9 haloperidol (HALDOL) 2 MG tablet      Past Psychiatric History: Schizoaffective disorder, Has been on Haldol for 10 years.  Was being seen at [facility] in the past  Past Medical History:  Past Medical History:  Diagnosis Date   #[id number]    Acne    Back pain     Essential hypertension    Seasonal allergies    Syncope 09/16/2014   Syncope and collapse 09/16/2014    Past Surgical History:  Procedure Laterality Date   DILITATION & CURRETTAGE/HYSTROSCOPY WITH NOVASURE ABLATION N/A 12/22/2017   Procedure: DILATATION & CURETTAGE/ HYSTEROSCOPY WITH ENDOMETRIAL FIBROID AND  NOVASURE ABLATION;  Surgeon: Lavonia Drafts, MD;  Location: [facility];  Service: Gynecology;  Laterality: N/A;    Family Psychiatric History: denied Family History: No family history on file.  Social History:  Social History   Socioeconomic History   Marital status: Married    Spouse name: Not on file   Number of children: Not on file   Years of education: Not on file   Highest education level: Not on file  Occupational History   Not on file  Tobacco Use   Smoking status: Never   Smokeless tobacco: Never  Vaping Use   Vaping Use: Never used  Substance and Sexual Activity   Alcohol use: Not Currently    Comment: occ.   Drug use: No   Sexual activity: Not on file    Comment: monogamous female partner for 5 years (2010)  Other Topics Concern   Not on file  Social History Narrative   Not on file   Social Determinants of Health   Financial Resource Strain: Not on file  Food Insecurity: Not on file  Transportation Needs: Not on file  Physical Activity: Not on file  Stress: Not on file  Social Connections: Not on file    Allergies:  Allergies  Allergen Reactions  Other Rash    Blood pressure pill (unknown name)    Metabolic Disorder Labs: Lab Results  Component Value Date   HGBA1C 5.5 01/24/2018   No results found for: PROLACTIN Lab Results  Component Value Date   CHOL 159 01/23/2020   TRIG 54 01/23/2020   HDL 58 01/23/2020   CHOLHDL 2.7 01/23/2020   LDLCALC 90 01/23/2020   LDLCALC 160 (H) 11/14/2017   Lab Results  Component Value Date   TSH 1.260 06/20/2019   TSH 1.840 08/24/2018    Therapeutic Level Labs: No results found for:  LITHIUM No results found for: VALPROATE No components found for:  CBMZ  Current Medications: Current Outpatient Medications  Medication Sig Dispense Refill   amLODipine (NORVASC) 5 MG tablet TAKE 1 TABLET (5 MG TOTAL) BY MOUTH DAILY. (Patient taking differently: Take 5 mg by mouth daily.) 90 tablet 1   atorvastatin (LIPITOR) 20 MG tablet TAKE 1 TABLET (20 MG TOTAL) BY MOUTH DAILY. TO LOWER CHOLESTEROL (Patient taking differently: Take 20 mg by mouth daily.) 90 tablet 2   doxycycline (VIBRAMYCIN) 100 MG capsule Take 1 capsule (100 mg total) by mouth 2 (two) times daily. 20 capsule 0   haloperidol (HALDOL) 2 MG tablet TAKE 1 TABLET (2 MG TOTAL) BY MOUTH AT BEDTIME. 30 tablet 3   naproxen (NAPROSYN) 500 MG tablet Take 1 tablet (500 mg total) by mouth 2 (two) times daily with a meal. 30 tablet 2   norethindrone (AYGESTIN) 5 MG tablet Take 1 tablet (5 mg total) by mouth daily. 30 tablet 0   nystatin cream (MYCOSTATIN) Apply cream to affected area 2 times daily 30 g 0   oxyCODONE-acetaminophen (PERCOCET/ROXICET) 5-325 MG tablet Take 1 tablet by mouth every 6 (six) hours as needed for severe pain. 15 tablet 0   valsartan-hydrochlorothiazide (DIOVAN-HCT) 80-12.5 MG tablet TAKE 1 TABLET BY MOUTH DAILY. 90 tablet 2   Vitamin D, Ergocalciferol, (DRISDOL) 1.25 MG (50000 UNIT) CAPS capsule TAKE 1 CAPSULE (50,000 UNITS TOTAL) BY MOUTH EVERY 7 (SEVEN) DAYS. (Patient taking differently: Take 50,000 Units by mouth every 7 (seven) days. Wednesday) 16 capsule 1   No current facility-administered medications for this visit.     Musculoskeletal: Strength & Muscle Tone:  Unable to assess due to telephone visit Gait & Station:  Unable to assess due to telephone visit Patient leans: N/A  Psychiatric Specialty Exam: Review of Systems  Last menstrual period 09/12/2017.There is no height or weight on file to calculate BMI.  General Appearance:  Unable to assess due to telephone visit  Eye Contact:   Unable to  assess due to telephone visit  Speech:  Clear and Coherent and Normal Rate  Volume:  Normal  Mood:  Euthymic  Affect:  Appropriate and Congruent  Thought Process:  Coherent, Goal Directed, and Linear  Orientation:  Full (Time, Place, and Person)  Thought Content: WDL and Logical   Suicidal Thoughts:  No  Homicidal Thoughts:  No  Memory:  Immediate;   Good Recent;   Good Remote;   Good  Judgement:  Good  Insight:  Good  Psychomotor Activity:   Unable to assess due to telephone visit  Concentration:  Concentration: Good and Attention Span: Good  Recall:  Good  Fund of Knowledge: Good  Language: Good  Akathisia:  No  Handed:  Right  AIMS (if indicated): not done  Assets:  Communication Skills Desire for Improvement Financial Resources/Insurance Housing Intimacy Social Support  ADL's:  Intact  Cognition: WNL  Sleep:  Good   Screenings: [facility] Office Visit from 04/01/2020 in [place] Total Score 0      GAD-7    Flowsheet Row Clinical Support from 07/30/2020 in [facility] Office Visit from 04/09/2020 in [facility] [place] [facility] Office Visit from 01/23/2020 in [place] Office Visit from 12/17/2019 in [facility] [place] [facility] Office Visit from 10/18/2019 in [facility] [place] at [facility]  Total GAD-7 Score 0 0 0 1 3      PHQ2-9    Flowsheet Row Clinical Support from 07/30/2020 in [facility] Office Visit from 06/24/2020 in [place] Office Visit from 04/09/2020 in [facility] [place] at [facility] Office Visit from 04/01/2020 in [facility] Office Visit from 01/23/2020 in [place]  PHQ-2 Total Score 0 0 0 0 0   PHQ-9 Total Score 0 1 0 -- --      Flowsheet Row ED from 10/17/2020 in [facility] ED from 07/03/2020 in [facility] [place] from 04/01/2020 in [place] No Risk No Risk Error: Q3, 4, or 5 should not be populated when Q2 is No        Assessment and Plan: Patient notes that she is doing well on her current medication regimen.  No medication changes made today.  Patient agreeable to continue medication as prescribed.  1. Schizoaffective disorder, unspecified type ([place])  Continue- haloperidol (HALDOL) 2 MG tablet; TAKE 1 TABLET (2 MG TOTAL) BY MOUTH AT BEDTIME.  Dispense: 30 tablet; Refill: 3    Salley Slaughter, NP 10/30/2020, 8:51 AM

## 2020-11-03 ENCOUNTER — Other Ambulatory Visit (INDEPENDENT_AMBULATORY_CARE_PROVIDER_SITE_OTHER): Payer: Self-pay | Admitting: Obstetrics and Gynecology

## 2020-11-03 DIAGNOSIS — D259 Leiomyoma of uterus, unspecified: Secondary | ICD-10-CM

## 2020-11-03 NOTE — Progress Notes (Signed)
Order placed per staff request

## 2020-11-04 ENCOUNTER — Ambulatory Visit (HOSPITAL_COMMUNITY): Admission: RE | Admit: 2020-11-04 | Payer: Self-pay | Source: Ambulatory Visit

## 2020-11-05 NOTE — Progress Notes (Signed)
Pt came into front office with questions regarding medications. Patient has completed Doxycycline antibiotic; explained this is completed. Pt asks about Tramadol and Aygestin; explained provider may choose to refill at appt on 11/10/20. Pt also asks for refill of Nystatin. This was prescribed by ED. Encouraged pt to follow up with PCP; pt states these appointments are too far out. Explained that patient can ask provider on Monday, but they may not be willing to refill this due to this being non OB/GYN related.

## 2020-11-06 ENCOUNTER — Emergency Department (HOSPITAL_COMMUNITY)
Admission: EM | Admit: 2020-11-06 | Discharge: 2020-11-06 | Disposition: A | Discharge status: Home / Self Care | Payer: Self-pay | Attending: Emergency Medicine | Admitting: Emergency Medicine

## 2020-11-06 ENCOUNTER — Other Ambulatory Visit: Payer: Self-pay

## 2020-11-06 ENCOUNTER — Encounter (HOSPITAL_COMMUNITY): Payer: Self-pay | Admitting: Emergency Medicine

## 2020-11-06 ENCOUNTER — Emergency Department (HOSPITAL_COMMUNITY): Payer: Self-pay

## 2020-11-06 DIAGNOSIS — R21 Rash and other nonspecific skin eruption: Secondary | ICD-10-CM | POA: Insufficient documentation

## 2020-11-06 DIAGNOSIS — S30810A Abrasion of lower back and pelvis, initial encounter: Secondary | ICD-10-CM | POA: Insufficient documentation

## 2020-11-06 DIAGNOSIS — I10 Essential (primary) hypertension: Secondary | ICD-10-CM | POA: Insufficient documentation

## 2020-11-06 DIAGNOSIS — X58XXXA Exposure to other specified factors, initial encounter: Secondary | ICD-10-CM | POA: Insufficient documentation

## 2020-11-06 DIAGNOSIS — Z79899 Other long term (current) drug therapy: Secondary | ICD-10-CM | POA: Insufficient documentation

## 2020-11-06 LAB — BASIC METABOLIC PANEL
Anion gap: 9 (ref 5–15)
BUN: 8 mg/dL (ref 6–20)
CO2: 23 mmol/L (ref 22–32)
Calcium: 9.2 mg/dL (ref 8.9–10.3)
Chloride: 105 mmol/L (ref 98–111)
Creatinine, Ser: 0.52 mg/dL (ref 0.44–1.00)
GFR, Estimated: >60 mL/min (ref >60)
Glucose, Bld: 151 mg/dL — ABNORMAL HIGH (ref 70–99)
Potassium: 3.9 mmol/L (ref 3.5–5.1)
Sodium: 137 mmol/L (ref 135–145)

## 2020-11-06 LAB — URINALYSIS, ROUTINE W REFLEX MICROSCOPIC
Bilirubin Urine: NEGATIVE
Glucose, UA: NEGATIVE mg/dL
Hgb urine dipstick: NEGATIVE
Ketones, ur: NEGATIVE mg/dL
Leukocytes,Ua: NEGATIVE
Nitrite: NEGATIVE
Protein, ur: NEGATIVE mg/dL
Specific Gravity, Urine: 1.005 (ref 1.005–1.030)
pH: 7 (ref 5.0–8.0)

## 2020-11-06 LAB — CBC WITH DIFFERENTIAL/PLATELET
Abs Immature Granulocytes: 0.02 10*3/uL (ref 0.00–0.07)
Basophils Absolute: 0 10*3/uL (ref 0.0–0.1)
Basophils Relative: 0 %
Eosinophils Absolute: 0.3 10*3/uL (ref 0.0–0.5)
Eosinophils Relative: 4 %
HCT: 35.9 % — ABNORMAL LOW (ref 36.0–46.0)
Hemoglobin: 12.6 g/dL (ref 12.0–15.0)
Immature Granulocytes: 0 %
Lymphocytes Relative: 21 %
Lymphs Abs: 1.8 10*3/uL (ref 0.7–4.0)
MCH: 26.3 pg (ref 26.0–34.0)
MCHC: 35.1 g/dL (ref 30.0–36.0)
MCV: 74.8 fL — ABNORMAL LOW (ref 80.0–100.0)
Monocytes Absolute: 0.8 10*3/uL (ref 0.1–1.0)
Monocytes Relative: 9 %
Neutro Abs: 5.5 10*3/uL (ref 1.7–7.7)
Neutrophils Relative %: 66 %
Platelets: 404 10*3/uL — ABNORMAL HIGH (ref 150–400)
RBC: 4.8 MIL/uL (ref 3.87–5.11)
RDW: 13.8 % (ref 11.5–15.5)
WBC: 8.4 10*3/uL (ref 4.0–10.5)
nRBC: 0 % (ref 0.0–0.2)

## 2020-11-06 LAB — LACTIC ACID, PLASMA
Lactic Acid, Venous: 1.3 mmol/L (ref 0.5–1.9)
Lactic Acid, Venous: 1.1 mmol/L (ref 0.5–1.9)

## 2020-11-06 MED ORDER — IOHEXOL 300 MG/ML  SOLN
75.0000 mL | Freq: Once | INTRAMUSCULAR | Status: AC | PRN
  Administered 2020-11-06: 75 mL via INTRAVENOUS

## 2020-11-06 MED ORDER — BACITRACIN ZINC 500 UNIT/GM EX OINT
1.0000 "application " | TOPICAL_OINTMENT | Freq: Two times a day (BID) | CUTANEOUS | 0 refills | Status: DC
  Filled 2020-11-06: qty 120, 67d supply, fill #0

## 2020-11-06 MED ORDER — NYSTATIN 100000 UNIT/GM EX CREA
TOPICAL_CREAM | CUTANEOUS | 0 refills | Status: DC
  Filled 2020-11-06: qty 30, fill #0
  Filled 2020-11-07: qty 30, 15d supply, fill #0

## 2020-11-06 NOTE — ED Triage Notes (Addendum)
Pt complains of a rash on buttocks.

## 2020-11-06 NOTE — ED Provider Notes (Signed)
Resents to the ED [facility] Provider Note   CSN: [id number] Arrival date & time: 11/06/20  1227     History Chief Complaint  Patient presents with   Rash    Cindy Garrison is a 50 y.o. female with a past medical history significant for schizoaffective disorder, hypertension, vitamin D deficiency who presents to the ED due to a "rash" to her buttocks region.  Patient states rash has been present for 2 weeks and has progressively worsened.  Patient states rash began after starting tramadol, doxycycline, and norethindrone.  Rash is associated with pain, pruritus, and clear drainage.  No fever or chills.  No difficulties with bowel movements.  No abdominal pain.  Denies any urinary symptoms.  No treatment prior to arrival.  Chart reviewed.  While patient was in the ED on 10/4 due to abdominal pain she also was evaluated for her rash.  Previous provider thought rash was likely yeast and patient was discharged with nystatin cream.  At that time, patient was already on doxycycline.  History obtained from patient and past medical records. Official [place] translator used during Games developer.      Past Medical History:  Diagnosis Date   #[id number]    Acne    Back pain    Essential hypertension    Seasonal allergies    Syncope 09/16/2014   Syncope and collapse 09/16/2014    Patient Active Problem List   Diagnosis Date Noted   Visit for routine gyn exam 04/09/2020   History of endometrial ablation 04/09/2020   Screening mammogram for breast cancer 04/09/2020   Cervical cancer screening 01/23/2020   Pure hypercholesterolemia 01/23/2020   Colon cancer screening 01/23/2020   Vitamin D deficiency 01/23/2020   Fibroid uterus 03/26/2015   Schizoaffective disorder ([place]) 09/16/2014   Essential hypertension    Allergic rhinitis 09/13/2006    Past Surgical History:  Procedure Laterality Date   DILITATION & CURRETTAGE/HYSTROSCOPY WITH NOVASURE  ABLATION N/A 12/22/2017   Procedure: DILATATION & CURETTAGE/ HYSTEROSCOPY WITH ENDOMETRIAL FIBROID AND  NOVASURE ABLATION;  Surgeon: Lavonia Drafts, MD;  Location: [facility];  Service: Gynecology;  Laterality: N/A;     OB History     Gravida  3   Para  0   Term  0   Preterm  0   AB  3   Living  0      SAB  3   IAB  0   Ectopic  0   Multiple  0   Live Births              No family history on file.  Social History   Tobacco Use   Smoking status: Never   Smokeless tobacco: Never  Vaping Use   Vaping Use: Never used  Substance Use Topics   Alcohol use: Not Currently    Comment: occ.   Drug use: No    Home Medications Prior to Admission medications   Medication Sig Start Date End Date Taking? Authorizing Provider  bacitracin ointment Apply 1 application topically 2 (two) times daily. 11/06/20  Yes Suzy Bouchard, PA-C  nystatin cream (MYCOSTATIN) Apply to affected area 2 times daily 11/06/20  Yes Kalani Sthilaire C, PA-C  amLODipine (NORVASC) 5 MG tablet TAKE 1 TABLET (5 MG TOTAL) BY MOUTH DAILY. Patient taking differently: Take 5 mg by mouth daily. 06/24/20 06/24/21  Elsie Stain, MD  atorvastatin (LIPITOR) 20 MG tablet TAKE 1 TABLET (20 MG TOTAL) BY  MOUTH DAILY. TO LOWER CHOLESTEROL Patient taking differently: Take 20 mg by mouth daily. 06/24/20 06/24/21  Elsie Stain, MD  doxycycline (VIBRAMYCIN) 100 MG capsule Take 1 capsule (100 mg total) by mouth 2 (two) times daily. 10/17/20   Donnamae Jude, MD  haloperidol (HALDOL) 2 MG tablet TAKE 1 TABLET (2 MG TOTAL) BY MOUTH AT BEDTIME. 10/30/20 10/30/21  Eulis Canner E, NP  naproxen (NAPROSYN) 500 MG tablet Take 1 tablet (500 mg total) by mouth 2 (two) times daily with a meal. 10/13/20   Chancy Milroy, MD  norethindrone (AYGESTIN) 5 MG tablet Take 1 tablet (5 mg total) by mouth daily. 10/17/20   Donnamae Jude, MD  oxyCODONE-acetaminophen (PERCOCET/ROXICET) 5-325 MG tablet Take 1 tablet by  mouth every 6 (six) hours as needed for severe pain. 10/22/20   Hayden Rasmussen, MD  valsartan-hydrochlorothiazide (DIOVAN-HCT) 80-12.5 MG tablet TAKE 1 TABLET BY MOUTH DAILY. 06/24/20 06/24/21  Elsie Stain, MD  Vitamin D, Ergocalciferol, (DRISDOL) 1.25 MG (50000 UNIT) CAPS capsule TAKE 1 CAPSULE (50,000 UNITS TOTAL) BY MOUTH EVERY 7 (SEVEN) DAYS. Patient taking differently: Take 50,000 Units by mouth every 7 (seven) days. Wednesday 06/24/20 06/24/21  Elsie Stain, MD  hydrochlorothiazide (HYDRODIURIL) 25 MG tablet Take 1 tablet (25 mg total) by mouth daily. 09/20/19 01/23/20  Charlott Rakes, MD  lisinopril (ZESTRIL) 40 MG tablet TAKE 1 TABLET (40 MG TOTAL) BY MOUTH DAILY. TO LOWER BLOOD PRESSURE 01/08/20 01/23/20  Charlott Rakes, MD  omeprazole (PRILOSEC) 40 MG capsule Take 1 capsule (40 mg total) by mouth 2 (two) times daily before a meal. 12/28/18 12/29/18  Antony Blackbird, MD    Allergies    Other  Review of Systems   Review of Systems  Constitutional:  Negative for appetite change and fever.  Gastrointestinal:  Negative for abdominal pain.  Genitourinary:  Negative for dysuria.  Skin:  Positive for color change, rash and wound.  All other systems reviewed and are negative.  Physical Exam Updated Vital Signs BP (!) 149/84 (BP Location: Left Arm)   Pulse 100   Temp 99.3 F (37.4 C)   Resp 17   LMP 09/12/2017 (LMP Unknown)   SpO2 99%   Physical Exam Vitals and nursing note reviewed.  Constitutional:      General: She is not in acute distress.    Appearance: She is not ill-appearing.  HENT:     Head: Normocephalic.  Eyes:     Pupils: Pupils are equal, round, and reactive to light.  Cardiovascular:     Rate and Rhythm: Normal rate and regular rhythm.     Pulses: Normal pulses.     Heart sounds: Normal heart sounds. No murmur heard.   No friction rub. No gallop.  Pulmonary:     Effort: Pulmonary effort is normal.     Breath sounds: Normal breath sounds.  Abdominal:      General: Abdomen is flat. There is no distension.     Palpations: Abdomen is soft.     Tenderness: There is no abdominal tenderness. There is no guarding or rebound.  Genitourinary:    Comments: GU exam performed with chaperone in room.  Indurated area to bilateral buttocks.  No fluctuance appreciated.  2 small abrasions.  Clear drainage.  See photo below.  Induration does not appear to go into rectal area Musculoskeletal:        General: Normal range of motion.     Cervical back: Neck supple.  Skin:  General: Skin is warm and dry.  Neurological:     General: No focal deficit present.     Mental Status: She is alert.  Psychiatric:        Mood and Affect: Mood normal.        Behavior: Behavior normal.     ED Results / Procedures / Treatments   Labs (all labs ordered are listed, but only abnormal results are displayed) Labs Reviewed  BASIC METABOLIC PANEL - Abnormal; Notable for the following components:      Result Value   Glucose, Bld 151 (*)    All other components within normal limits  CBC WITH DIFFERENTIAL/PLATELET - Abnormal; Notable for the following components:   HCT 35.9 (*)    MCV 74.8 (*)    Platelets 404 (*)    All other components within normal limits  URINALYSIS, ROUTINE W REFLEX MICROSCOPIC - Abnormal; Notable for the following components:   Color, Urine STRAW (*)    All other components within normal limits  LACTIC ACID, PLASMA  LACTIC ACID, PLASMA    EKG None  Radiology CT ABDOMEN PELVIS W CONTRAST  Result Date: 11/06/2020 CLINICAL DATA:  Abscess, history of enlarged uterus and endometrial fluid on prior examinations EXAM: CT ABDOMEN AND PELVIS WITH CONTRAST TECHNIQUE: Multidetector CT imaging of the abdomen and pelvis was performed using the standard protocol following bolus administration of intravenous contrast. CONTRAST:  9mL OMNIPAQUE IOHEXOL 300 MG/ML  SOLN COMPARISON:  09/19/2020, 10/21/2020 FINDINGS: Lower chest: No acute pleural or parenchymal  lung disease. Hepatobiliary: Gallbladder is decompressed without evidence of cholelithiasis or cholecystitis. The liver is unremarkable. Pancreas: Unremarkable. No pancreatic ductal dilatation or surrounding inflammatory changes. Spleen: Normal in size without focal abnormality. Adrenals/Urinary Tract: Adrenal glands are unremarkable. Kidneys are normal, without renal calculi, focal lesion, or hydronephrosis. Bladder is unremarkable. Stomach/Bowel: No bowel obstruction or ileus. Normal appendix right lower quadrant. No bowel wall thickening or inflammatory change. Vascular/Lymphatic: No significant vascular findings are present. No enlarged abdominal or pelvic lymph nodes. Reproductive: As identified on previous CT and ultrasound, the uterus is enlarged, with significant retained fluid within the endometrial cavity. The uterus measures approximately 17.8 x 10.3 cm in greatest transverse dimension, and approximately 24 cm in craniocaudal direction. Fluid in the endometrial cavity measures up to 14.0 x 7.9 cm in transverse dimension. As before, findings are concerning for underlying obstructive cervical process such as mass or stenosis. Gynecologic evaluation is recommended. Suspected intramural fibroid is seen within the left lateral aspect of the uterine body, unchanged. Suspected bilateral hydrosalpinx, left greater than right, again noted. The ovaries are difficult identified. Other: No free fluid or free gas.  No abdominal wall hernia. Musculoskeletal: No acute or destructive bony lesions. Reconstructed images demonstrate no additional findings. IMPRESSION: 1. Persistent enlargement of the uterus, with marked endometrial fluid and likely secondary bilateral hydrosalpinx unchanged since prior CT and ultrasound. Findings are highly concerning for underlying cervical stenosis or obstructing cervical mass. Gynecologic consultation is recommended. 2. Stable uterine fibroid. 3. Otherwise no acute intra-abdominal or  intrapelvic process. Electronically Signed   By: Randa Ngo M.D.   On: 11/06/2020 16:02    Procedures Procedures   Medications Ordered in ED Medications  iohexol (OMNIPAQUE) 300 MG/ML solution 75 mL (75 mLs Intravenous Contrast Given 11/06/20 1525)    ED Course  I have reviewed the triage vital signs and the nursing notes.  Pertinent labs & imaging results that were available during my care of the patient were reviewed by  me and considered in my medical decision making (see chart for details).    MDM Rules/Calculators/A&P                           50 year old female presents to the ED due to a possible buttocks abscess.  Patient notes area of irritation has been present for 2 weeks after starting tramadol, doxycycline, and norethindrone.  No fever or chills however, patient borderline febrile in triage at 99.2 F and tachycardic 126.  Area of induration to bilateral buttocks.  Induration does not appear to go into rectum.  Patient nontoxic-appearing.  Given extensive area of induration, will obtain CT pelvis to rule out abscess.  Labs ordered at triage. Of note, patient was seen in the ED on 10/4 and the rash was thought to be related to a yeast infection and patient was discharged with nystatin which she has used.  CBC reassuring with no leukocytosis and normal hemoglobin.  BMP significant for hyperglycemia 151.  No anion gap.  Normal renal function.  No major electrolyte derangements.  Lactic acid normal at 1.3.  Low suspicion for sepsis. CT abdomen personally reviewed which demonstrates: IMPRESSION:  1. Persistent enlargement of the uterus, with marked endometrial  fluid and likely secondary bilateral hydrosalpinx unchanged since  prior CT and ultrasound. Findings are highly concerning for  underlying cervical stenosis or obstructing cervical mass.  Gynecologic consultation is recommended.  2. Stable uterine fibroid.  3. Otherwise no acute intra-abdominal or intrapelvic process.    No visible abscess. Suspect symptoms related to yeast vs. Cellulitis. Patient currently on doxycycline, instructed patient to continue taking.  Patient discharged with nystatin and bacitracin.  Dermatology number given to patient at discharge and advised to call if symptoms not improve over the next week. Vitals normalized. Low suspicion for sepsis. Discussed CT findings in regards to abnormal uterus.  Patient has a scheduled OB/GYN appointment coming up.  Encouraged patient to report to scheduled appointment. Strict ED precautions discussed with patient. Patient states understanding and agrees to plan. Patient discharged home in no acute distress and stable vitals  Final Clinical Impression(s) / ED Diagnoses Final diagnoses:  Rash    Rx / DC Orders ED Discharge Orders          Ordered    nystatin cream (MYCOSTATIN)        11/06/20 1652    bacitracin ointment  2 times daily        11/06/20 1652             Suzy Bouchard, PA-C 11/06/20 1656    Jeanell Sparrow, DO 11/07/20 1928

## 2020-11-06 NOTE — Discharge Instructions (Addendum)
It was a pleasure taking care of you today.  As discussed, your CT scan did not show an abscess.  I am sending you home with 2 creams.  Use daily.  I have included the number of the dermatologist.  Please call to schedule an appointment if rash does not improve over the next week.  Please report to your scheduled OB/GYN appointment in regards to the abnormal findings in your uterus.  Return to the ER for any worsening symptoms.

## 2020-11-06 NOTE — ED Provider Notes (Signed)
Emergency Medicine Provider Triage Evaluation Note  Cindy Garrison , a 50 y.o. female  was evaluated in triage.  Pt complains of pain and rash to her buttock.  Has been there for over a week, patient was started on tramadol, doxycycline and norethindrone 10/17/2020.  Patient reports the rashes been getting worse, she is nervous.  Not having any pain in her chest or shortness of breath.  Denies any vomiting or fevers at home..  Review of Systems  Positive: Rash Negative: Fever, diarrhea, chest pain, shortness of breath, abdominal pain  Physical Exam  BP (!) 168/116 (BP Location: Right Arm)   Pulse (!) 126   Temp 99.2 F (37.3 C) (Oral)   Resp 18   LMP 09/12/2017 (LMP Unknown)   SpO2 100%  Gen:   Awake, no distress   Resp:  Normal effort  MSK:   Moves extremities without difficulty  Other:  Patient is tachycardic, genital exam deferred till in room.  Medical Decision Making  Medically screening exam initiated at 12:42 PM.  Appropriate orders placed.  Louis Matte Slotnick was informed that the remainder of the evaluation will be completed by another provider, this initial triage assessment does not replace that evaluation, and the importance of remaining in the ED until their evaluation is complete.  Labs, possible sepsis given elevated oral temperature and tachycardia of 126.  Patient not having any chest pain or shortness of breath   Sherrill Raring, PA-C 11/06/20 [place], DO 11/07/20 1928

## 2020-11-07 ENCOUNTER — Other Ambulatory Visit: Payer: Self-pay

## 2020-11-10 ENCOUNTER — Ambulatory Visit (INDEPENDENT_AMBULATORY_CARE_PROVIDER_SITE_OTHER): Payer: Self-pay | Admitting: Obstetrics and Gynecology

## 2020-11-10 ENCOUNTER — Encounter: Payer: Self-pay | Admitting: Obstetrics and Gynecology

## 2020-11-10 ENCOUNTER — Other Ambulatory Visit: Payer: Self-pay

## 2020-11-10 VITALS — BP 147/87 | HR 111 | Wt 148.2 lb

## 2020-11-10 DIAGNOSIS — L98491 Non-pressure chronic ulcer of skin of other sites limited to breakdown of skin: Secondary | ICD-10-CM

## 2020-11-10 DIAGNOSIS — Z9889 Other specified postprocedural states: Secondary | ICD-10-CM

## 2020-11-10 DIAGNOSIS — N838 Other noninflammatory disorders of ovary, fallopian tube and broad ligament: Secondary | ICD-10-CM

## 2020-11-10 DIAGNOSIS — D252 Subserosal leiomyoma of uterus: Secondary | ICD-10-CM

## 2020-11-10 DIAGNOSIS — Z3202 Encounter for pregnancy test, result negative: Secondary | ICD-10-CM

## 2020-11-10 DIAGNOSIS — L98499 Non-pressure chronic ulcer of skin of other sites with unspecified severity: Secondary | ICD-10-CM | POA: Insufficient documentation

## 2020-11-10 DIAGNOSIS — D251 Intramural leiomyoma of uterus: Secondary | ICD-10-CM

## 2020-11-10 DIAGNOSIS — N882 Stricture and stenosis of cervix uteri: Secondary | ICD-10-CM

## 2020-11-10 LAB — POCT PREGNANCY, URINE: Preg Test, Ur: NEGATIVE

## 2020-11-10 MED ORDER — BACITRACIN ZINC 500 UNIT/GM EX OINT
1.0000 "application " | TOPICAL_OINTMENT | Freq: Two times a day (BID) | CUTANEOUS | 1 refills | Status: DC
  Filled 2020-11-10: qty 120, 67d supply, fill #0

## 2020-11-10 NOTE — Progress Notes (Signed)
MRI scheduled at [facility] on November 14th @ 0730.  Pt notified.  Mammogram scholarship provided for mammogram as pt is self pay.    Frances Nickels  11/10/20

## 2020-11-10 NOTE — Progress Notes (Signed)
CC:  surgery discussion Subjective:    Patient ID: [name], female    DOB: Aug 01, 1970, 50 y.o.   MRN: [id number]  HPI  50 yo female seen for surgery discussion.  Pt has hx of uterine ablation with occasional bleeding.  She was briefly seen in the office 09/2020 with increased pain and now desires surgical management.  She has been offered surgery several times in the past due to her fibroids but has declined.  Uterus is now 20 week size per previous records.  Uterine size continues to increase and there are some questionable adnexal findings as well.  MRI was previously ordered, but for some reason radiology repeated a CT scan.  MRI reordered.    Review of Systems     Objective:   Physical Exam Vitals:   11/10/20 0925  BP: (!) 147/87  Pulse: (!) 111   SVE: cervical os is stenotic.  Unable to pass cytobrush through os so endometrial biopsy not attempted.  20 weeks size uterus  Healing superficial ulcers on bilateral gluteus maximuns in the gluteal fold.  Nom obvious sign of infection or drainage.  CLINICAL DATA:  Abscess, history of enlarged uterus and endometrial fluid on prior examinations   EXAM: CT ABDOMEN AND PELVIS WITH CONTRAST   TECHNIQUE: Multidetector CT imaging of the abdomen and pelvis was performed using the standard protocol following bolus administration of intravenous contrast.   CONTRAST:  83mL OMNIPAQUE IOHEXOL 300 MG/ML  SOLN   COMPARISON:  09/19/2020, 10/21/2020   FINDINGS: Lower chest: No acute pleural or parenchymal lung disease.   Hepatobiliary: Gallbladder is decompressed without evidence of cholelithiasis or cholecystitis. The liver is unremarkable.   Pancreas: Unremarkable. No pancreatic ductal dilatation or surrounding inflammatory changes.   Spleen: Normal in size without focal abnormality.   Adrenals/Urinary Tract: Adrenal glands are unremarkable. Kidneys are normal, without renal calculi, focal lesion, or  hydronephrosis. Bladder is unremarkable.   Stomach/Bowel: No bowel obstruction or ileus. Normal appendix right lower quadrant. No bowel wall thickening or inflammatory change.   Vascular/Lymphatic: No significant vascular findings are present. No enlarged abdominal or pelvic lymph nodes.   Reproductive: As identified on previous CT and ultrasound, the uterus is enlarged, with significant retained fluid within the endometrial cavity. The uterus measures approximately 17.8 x 10.3 cm in greatest transverse dimension, and approximately 24 cm in craniocaudal direction. Fluid in the endometrial cavity measures up to 14.0 x 7.9 cm in transverse dimension. As before, findings are concerning for underlying obstructive cervical process such as mass or stenosis. Gynecologic evaluation is recommended.   Suspected intramural fibroid is seen within the left lateral aspect of the uterine body, unchanged. Suspected bilateral hydrosalpinx, left greater than right, again noted. The ovaries are difficult identified.   Other: No free fluid or free gas.  No abdominal wall hernia.   Musculoskeletal: No acute or destructive bony lesions. Reconstructed images demonstrate no additional findings.   IMPRESSION: 1. Persistent enlargement of the uterus, with marked endometrial fluid and likely secondary bilateral hydrosalpinx unchanged since prior CT and ultrasound. Findings are highly concerning for underlying cervical stenosis or obstructing cervical mass. Gynecologic consultation is recommended. 2. Stable uterine fibroid. 3. Otherwise no acute intra-abdominal or intrapelvic process.      Assessment & Plan:   1. Intramural and subserous leiomyoma of uterus Enlarged uterus, fibroids seen on CT, MRI ordered to rapid growth in size and further eval of adnexa.  2. History of endometrial ablation In conjunction with stenotic cervix pt may  have hematocolpos  or other fluid collection in uterus  3.  Ovarian mass ROMA ordered, CA 125 noted as moderately elevated Spoke with gyn onc, recommended getting endometrial biopsy and MRI before further referral  - Ovarian Malignancy Risk-ROMA  4. Cervical os stenosis Pt will need misoprostol ripening and u/s guided dilation and hysteroscopy for further evaluation and uterine sampling, will schedule following pelvic MRI has resulted.  5. Skin ulcer, limited to breakdown of skin (HCC) Continue bacitracin treatment, questionable allergy from doxycycline? - bacitracin ointment; Apply 1 application topically 2 (two) times daily.  Dispense: 120 g; Refill: 1  Will schedule u/s guided hysteroscopy with D and C once MRI has resulted.  Griffin Basil, MD Faculty Attending, [facility]

## 2020-11-11 LAB — OVARIAN MALIGNANCY RISK-ROMA
Cancer Antigen (CA) 125: 46.7 U/mL — ABNORMAL HIGH (ref 0.0–38.1)
HE4: 39.6 pmol/L (ref 0.0–105.2)
Postmenopausal ROMA: 1.9
Premenopausal ROMA: 0.47

## 2020-11-11 LAB — PREMENOPAUSAL INTERP: LOW

## 2020-11-11 LAB — POSTMENOPAUSAL INTERP: LOW

## 2020-11-17 ENCOUNTER — Other Ambulatory Visit: Payer: Self-pay

## 2020-11-23 NOTE — Progress Notes (Signed)
Established Patient Office Visit  Subjective:  Patient ID: Cindy Garrison, female    DOB: 1970-06-11  Age: 50 y.o. MRN: [id number]  CC:  Chief Complaint  Patient presents with   Hypertension   Foot Swelling     HPI [name] presents for primary care follow-up hypertension and complaints of foot swelling bilaterally.  This visit was assisted by [place] interpreter Herbert Pun [phone]  Patient states for the past several weeks she has had lower extremity edema up to the knee with chronic knee pain.  She states her feet will burn at times she thinks she is having lesions on the left side of both feet.  Patient does have significant fibroid disease of the uterus and also has an ovarian lesion with elevated CEA level.  Patient is under going MRI soon and may need hysterectomy.  She is followed closely by gynecology.  Patient is yet to receive her mammogram that was ordered previously.  She does declined a flu vaccine at this visit.  Patient has no other real complaints.  She did have a rash in her rectal area went to the emergency room October 20 through this.  She received a course of antibiotics topical bacitracin and nystatin with resolution of this rash. Note on arrival blood pressure is elevated he has systolic hypertension with blood pressures in the 146 to 149 range but diastolic is normally a lower 80 range  Past Surgical History:  Procedure Laterality Date   DILITATION & CURRETTAGE/HYSTROSCOPY WITH NOVASURE ABLATION N/A 12/22/2017   Procedure: DILATATION & CURETTAGE/ HYSTEROSCOPY WITH ENDOMETRIAL FIBROID AND  NOVASURE ABLATION;  Surgeon: Lavonia Drafts, MD;  Location: [facility];  Service: Gynecology;  Laterality: N/A;    History reviewed. No pertinent family history.  Social History   Socioeconomic History   Marital status: Married    Spouse name: Not on file   Number of children: Not on file   Years of education: Not on file   Highest education level: Not on  file  Occupational History   Not on file  Tobacco Use   Smoking status: Never   Smokeless tobacco: Never  Vaping Use   Vaping Use: Never used  Substance and Sexual Activity   Alcohol use: Not Currently    Comment: occ.   Drug use: No   Sexual activity: Not on file    Comment: monogamous female partner for 5 years (2010)  Other Topics Concern   Not on file  Social History Narrative   Not on file   Social Determinants of Health   Financial Resource Strain: Not on file  Food Insecurity: Not on file  Transportation Needs: Not on file  Physical Activity: Not on file  Stress: Not on file  Social Connections: Not on file  Intimate Partner Violence: Not on file    Outpatient Medications Prior to Visit  Medication Sig Dispense Refill   haloperidol (HALDOL) 2 MG tablet TAKE 1 TABLET (2 MG TOTAL) BY MOUTH AT BEDTIME. 30 tablet 3   naproxen (NAPROSYN) 500 MG tablet Take 1 tablet (500 mg total) by mouth 2 (two) times daily with a meal. 30 tablet 2   norethindrone (AYGESTIN) 5 MG tablet Take 1 tablet (5 mg total) by mouth daily. 30 tablet 0   Vitamin D, Ergocalciferol, (DRISDOL) 1.25 MG (50000 UNIT) CAPS capsule TAKE 1 CAPSULE (50,000 UNITS TOTAL) BY MOUTH EVERY 7 (SEVEN) DAYS. (Patient taking differently: Take 50,000 Units by mouth every 7 (seven) days. Wednesday) 16 capsule 1  amLODipine (NORVASC) 5 MG tablet TAKE 1 TABLET (5 MG TOTAL) BY MOUTH DAILY. (Patient taking differently: Take 5 mg by mouth daily.) 90 tablet 1   atorvastatin (LIPITOR) 20 MG tablet TAKE 1 TABLET (20 MG TOTAL) BY MOUTH DAILY. TO LOWER CHOLESTEROL (Patient taking differently: Take 20 mg by mouth daily.) 90 tablet 2   doxycycline (VIBRAMYCIN) 100 MG capsule Take 1 capsule (100 mg total) by mouth 2 (two) times daily. 20 capsule 0   oxyCODONE-acetaminophen (PERCOCET/ROXICET) 5-325 MG tablet Take 1 tablet by mouth every 6 (six) hours as needed for severe pain. 15 tablet 0   valsartan-hydrochlorothiazide (DIOVAN-HCT)  80-12.5 MG tablet TAKE 1 TABLET BY MOUTH DAILY. 90 tablet 2   bacitracin ointment Apply 1 application topically 2 (two) times daily. (Patient not taking: Reported on 11/24/2020) 120 g 1   nystatin cream (MYCOSTATIN) Apply to affected area 2 times daily (Patient not taking: Reported on 11/24/2020) 30 g 0   No facility-administered medications prior to visit.    Allergies  Allergen Reactions   Other Rash    Blood pressure pill (unknown name)    ROS Review of Systems  Constitutional:  Negative for chills, diaphoresis and fever.  HENT:  Negative for congestion, hearing loss, nosebleeds, sore throat and tinnitus.   Eyes:  Negative for photophobia and redness.  Respiratory:  Negative for cough, shortness of breath, wheezing and stridor.   Cardiovascular:  Positive for leg swelling. Negative for chest pain and palpitations.  Gastrointestinal:  Negative for abdominal pain, blood in stool, constipation, diarrhea, nausea and vomiting.  Endocrine: Negative for polydipsia.  Genitourinary:  Negative for dysuria, flank pain, frequency, hematuria and urgency.  Musculoskeletal:  Negative for back pain, myalgias and neck pain.  Skin:  Positive for rash.  Allergic/Immunologic: Negative for environmental allergies.  Neurological:  Negative for dizziness, tremors, seizures, weakness and headaches.  Hematological:  Does not bruise/bleed easily.  Psychiatric/Behavioral:  Negative for suicidal ideas. The patient is not nervous/anxious.      Objective:    Physical Exam Vitals reviewed.  Constitutional:      Appearance: Normal appearance. She is well-developed. She is not diaphoretic.  HENT:     Head: Normocephalic and atraumatic.     Nose: No nasal deformity, septal deviation, mucosal edema or rhinorrhea.     Right Sinus: No maxillary sinus tenderness or frontal sinus tenderness.     Left Sinus: No maxillary sinus tenderness or frontal sinus tenderness.     Mouth/Throat:     Pharynx: No  oropharyngeal exudate.  Eyes:     General: No scleral icterus.    Conjunctiva/sclera: Conjunctivae normal.     Pupils: Pupils are equal, round, and reactive to light.  Neck:     Thyroid: No thyromegaly.     Vascular: No carotid bruit or JVD.     Trachea: Trachea normal. No tracheal tenderness or tracheal deviation.  Cardiovascular:     Rate and Rhythm: Normal rate and regular rhythm.     Chest Wall: PMI is not displaced.     Pulses: Normal pulses. No decreased pulses.     Heart sounds: Normal heart sounds, S1 normal and S2 normal. Heart sounds not distant. No murmur heard. No systolic murmur is present.  No diastolic murmur is present.    No friction rub. No gallop. No S3 or S4 sounds.  Pulmonary:     Effort: No tachypnea, accessory muscle usage or respiratory distress.     Breath sounds: No stridor. No decreased breath  sounds, wheezing, rhonchi or rales.  Chest:     Chest wall: No tenderness.  Abdominal:     General: Bowel sounds are normal. There is distension.     Palpations: Abdomen is not rigid. There is no mass.     Tenderness: There is no abdominal tenderness. There is no guarding or rebound.     Hernia: No hernia is present.  Musculoskeletal:        General: Normal range of motion.     Cervical back: Normal range of motion and neck supple. No edema, erythema or rigidity. No muscular tenderness. Normal range of motion.     Comments: No rash no edema in lower extremities  Lymphadenopathy:     Head:     Right side of head: No submental or submandibular adenopathy.     Left side of head: No submental or submandibular adenopathy.     Cervical: No cervical adenopathy.  Skin:    General: Skin is warm and dry.     Coloration: Skin is not pale.     Findings: No rash.     Nails: There is no clubbing.  Neurological:     Mental Status: She is alert and oriented to person, place, and time.     Sensory: No sensory deficit.  Psychiatric:        Mood and Affect: Mood normal.         Speech: Speech normal.        Behavior: Behavior normal.        Thought Content: Thought content normal.        Judgment: Judgment normal.    BP (!) 146/82   Pulse (!) 108   Resp 16   Wt 149 lb 3.2 oz (67.7 kg)   LMP 09/12/2017 (LMP Unknown)   SpO2 98%   BMI 28.19 kg/m  Wt Readings from Last 3 Encounters:  11/24/20 149 lb 3.2 oz (67.7 kg)  11/10/20 148 lb 3.2 oz (67.2 kg)  10/13/20 148 lb 9.6 oz (67.4 kg)     Health Maintenance Due  Topic Date Due   MAMMOGRAM  Never done   COVID-19 Vaccine (2 - [facility]sen risk series) 04/21/2020    There are no preventive care reminders to display for this patient.  Lab Results  Component Value Date   TSH 1.260 06/20/2019   Lab Results  Component Value Date   WBC 8.4 11/06/2020   HGB 12.6 11/06/2020   HCT 35.9 (L) 11/06/2020   MCV 74.8 (L) 11/06/2020   PLT 404 (H) 11/06/2020   Lab Results  Component Value Date   NA 137 11/06/2020   K 3.9 11/06/2020   CO2 23 11/06/2020   GLUCOSE 151 (H) 11/06/2020   BUN 8 11/06/2020   CREATININE 0.52 11/06/2020   BILITOT 0.6 10/21/2020   ALKPHOS 44 10/21/2020   AST 15 10/21/2020   ALT 14 10/21/2020   PROT 6.9 10/21/2020   ALBUMIN 3.4 (L) 10/21/2020   CALCIUM 9.2 11/06/2020   ANIONGAP 9 11/06/2020   Lab Results  Component Value Date   CHOL 159 01/23/2020   Lab Results  Component Value Date   HDL 58 01/23/2020   Lab Results  Component Value Date   LDLCALC 90 01/23/2020   Lab Results  Component Value Date   TRIG 54 01/23/2020   Lab Results  Component Value Date   CHOLHDL 2.7 01/23/2020   Lab Results  Component Value Date   HGBA1C 5.5 01/24/2018  Assessment & Plan:   Problem List Items Addressed This Visit       Cardiovascular and Mediastinum   Essential hypertension    Blood pressure not at goal plan increase valsartan HCT to 160/25 daily continue amlodipine 5 mg daily  Patient is somewhat confused about her medication she will be scheduled with clinical  pharmacy to go over her medications and do medication reconciliation  The patient does not have significant lower extremity edema I do not believe amlodipine is an issue here      Relevant Medications   amLODipine (NORVASC) 5 MG tablet   atorvastatin (LIPITOR) 20 MG tablet   valsartan-hydrochlorothiazide (DIOVAN-HCT) 160-25 MG tablet     Genitourinary   Fibroid uterus    Fibroid uterus is present  Follow-up per gynecology        Other   Schizoaffective disorder (HCC)    Stable on Haldol plan per psychiatry      Pure hypercholesterolemia   Relevant Medications   amLODipine (NORVASC) 5 MG tablet   atorvastatin (LIPITOR) 20 MG tablet   valsartan-hydrochlorothiazide (DIOVAN-HCT) 160-25 MG tablet   Vitamin D deficiency    Continue weekly vitamin D      Screening mammogram for breast cancer    Patient is yet to get her mammogram we gave her resources to apply for scholarship to receive a free mammogram as she is uninsured      Ovarian mass    Follow-up per gynecology has an MRI scheduled       Meds ordered this encounter  Medications   amLODipine (NORVASC) 5 MG tablet    Sig: Take 1 tablet (5 mg total) by mouth daily.    Dispense:  90 tablet    Refill:  2   atorvastatin (LIPITOR) 20 MG tablet    Sig: Take 1 tablet (20 mg total) by mouth daily.    Dispense:  90 tablet    Refill:  2   valsartan-hydrochlorothiazide (DIOVAN-HCT) 160-25 MG tablet    Sig: Take 1 tablet by mouth daily.    Dispense:  90 tablet    Refill:  3   38 minutes spent interviewing the patient performing exam using interpreter for communication  Follow-up: Return in about 2 months (around 01/24/2021).    Asencion Noble, MD

## 2020-11-24 ENCOUNTER — Encounter: Payer: Self-pay | Admitting: Critical Care Medicine

## 2020-11-24 ENCOUNTER — Other Ambulatory Visit: Payer: Self-pay

## 2020-11-24 ENCOUNTER — Ambulatory Visit: Payer: Self-pay | Attending: Critical Care Medicine | Admitting: Critical Care Medicine

## 2020-11-24 DIAGNOSIS — I10 Essential (primary) hypertension: Secondary | ICD-10-CM

## 2020-11-24 DIAGNOSIS — D251 Intramural leiomyoma of uterus: Secondary | ICD-10-CM

## 2020-11-24 DIAGNOSIS — Z1231 Encounter for screening mammogram for malignant neoplasm of breast: Secondary | ICD-10-CM

## 2020-11-24 DIAGNOSIS — E559 Vitamin D deficiency, unspecified: Secondary | ICD-10-CM

## 2020-11-24 DIAGNOSIS — E7800 Pure hypercholesterolemia, unspecified: Secondary | ICD-10-CM

## 2020-11-24 DIAGNOSIS — N838 Other noninflammatory disorders of ovary, fallopian tube and broad ligament: Secondary | ICD-10-CM

## 2020-11-24 DIAGNOSIS — F259 Schizoaffective disorder, unspecified: Secondary | ICD-10-CM

## 2020-11-24 DIAGNOSIS — D252 Subserosal leiomyoma of uterus: Secondary | ICD-10-CM

## 2020-11-24 MED ORDER — AMLODIPINE BESYLATE 5 MG PO TABS
5.0000 mg | ORAL_TABLET | Freq: Every day | ORAL | 2 refills | Status: DC
  Filled 2020-11-24: qty 90, 90d supply, fill #0
  Filled 2020-12-03: qty 30, 30d supply, fill #0
  Filled 2021-01-01: qty 30, 30d supply, fill #1
  Filled 2021-01-26: qty 30, 30d supply, fill #0

## 2020-11-24 MED ORDER — VALSARTAN-HYDROCHLOROTHIAZIDE 160-25 MG PO TABS
1.0000 | ORAL_TABLET | Freq: Every day | ORAL | 3 refills | Status: DC
  Filled 2020-11-24: qty 30, 30d supply, fill #0
  Filled 2020-12-25: qty 30, 30d supply, fill #1
  Filled 2021-01-26: qty 30, 30d supply, fill #0

## 2020-11-24 MED ORDER — VITAMIN D (ERGOCALCIFEROL) 1.25 MG (50000 UNIT) PO CAPS
50000.0000 [IU] | ORAL_CAPSULE | ORAL | 4 refills | Status: DC
  Filled 2020-11-24: qty 12, 84d supply, fill #0
  Filled 2020-12-18: qty 4, 28d supply, fill #0
  Filled 2021-01-21: qty 4, 28d supply, fill #1

## 2020-11-24 MED ORDER — ATORVASTATIN CALCIUM 20 MG PO TABS
20.0000 mg | ORAL_TABLET | Freq: Every day | ORAL | 2 refills | Status: DC
  Filled 2020-11-24: qty 90, 90d supply, fill #0
  Filled 2020-12-18: qty 30, 30d supply, fill #0
  Filled 2021-01-15: qty 30, 30d supply, fill #1

## 2020-11-24 NOTE — Patient Instructions (Addendum)
Call the cancer control program to apply for free mammogram  they will order the studies once approved The number is:   [phone]  [employer] programme de [employer] cancer pour demander une mammographie gratuite, ils commander les tudes une fois approuves Le numro est : [phone]   You declined the flu vaccine today  Stay on amlodipine one daily  Increase Valsartan HCT to two tablets daily until current bottle empty, then get refill at higher dose one tablet daily  See [facility] for medication assessement and blood pressure visit  Return Dr Joya Gaskins 2 months   Vous avez refus le vaccin contre la grippe aujourd'hui  Restez sous amlodipine une fois par jour  Augmentez Valsartan HCT  deux comprims par jour jusqu' ce que la bouteille actuelle soit vide, puis obtenez une recharge  Monument dose plus leve d'un comprim par jour  Voir la pharmacie clin[facility] de [place] pour l'valuation des mdicaments et la visite de la pression artrielle  Retour Dr Joya Gaskins 2 mois

## 2020-11-24 NOTE — Assessment & Plan Note (Signed)
Fibroid uterus is present  Follow-up per gynecology

## 2020-11-24 NOTE — Assessment & Plan Note (Addendum)
Blood pressure not at goal plan increase valsartan HCT to 160/25 daily continue amlodipine 5 mg daily  Patient is somewhat confused about her medication she will be scheduled with clinical pharmacy to go over her medications and do medication reconciliation  The patient does not have significant lower extremity edema I do not believe amlodipine is an issue here

## 2020-11-24 NOTE — Assessment & Plan Note (Signed)
Follow-up per gynecology has an MRI scheduled

## 2020-11-24 NOTE — Assessment & Plan Note (Signed)
Continue weekly vitamin-D.

## 2020-11-24 NOTE — Assessment & Plan Note (Signed)
Stable on Haldol plan per psychiatry

## 2020-11-24 NOTE — Assessment & Plan Note (Signed)
Patient is yet to get her mammogram we gave her resources to apply for scholarship to receive a free mammogram as she is uninsured

## 2020-11-26 ENCOUNTER — Other Ambulatory Visit: Payer: Self-pay

## 2020-11-27 ENCOUNTER — Other Ambulatory Visit: Payer: Self-pay

## 2020-11-27 ENCOUNTER — Ambulatory Visit
Admission: RE | Admit: 2020-11-27 | Discharge: 2020-11-27 | Disposition: A | Discharge status: Home / Self Care | Payer: No Typology Code available for payment source | Source: Ambulatory Visit | Attending: Critical Care Medicine | Admitting: Critical Care Medicine

## 2020-11-27 DIAGNOSIS — Z1231 Encounter for screening mammogram for malignant neoplasm of breast: Secondary | ICD-10-CM

## 2020-11-30 ENCOUNTER — Other Ambulatory Visit: Payer: Self-pay | Admitting: Family Medicine

## 2020-11-30 DIAGNOSIS — D259 Leiomyoma of uterus, unspecified: Secondary | ICD-10-CM

## 2020-12-01 ENCOUNTER — Ambulatory Visit (HOSPITAL_COMMUNITY): Admission: RE | Admit: 2020-12-01 | Payer: Self-pay | Source: Ambulatory Visit

## 2020-12-03 ENCOUNTER — Other Ambulatory Visit: Payer: Self-pay

## 2020-12-16 ENCOUNTER — Ambulatory Visit: Payer: Self-pay | Admitting: Pharmacist

## 2020-12-18 ENCOUNTER — Other Ambulatory Visit: Payer: Self-pay

## 2020-12-25 ENCOUNTER — Other Ambulatory Visit: Payer: Self-pay

## 2021-01-01 ENCOUNTER — Emergency Department (HOSPITAL_COMMUNITY)
Admission: EM | Admit: 2021-01-01 | Discharge: 2021-01-01 | Disposition: A | Discharge status: Home / Self Care | Payer: No Typology Code available for payment source | Attending: Emergency Medicine | Admitting: Emergency Medicine

## 2021-01-01 ENCOUNTER — Other Ambulatory Visit: Payer: Self-pay

## 2021-01-01 ENCOUNTER — Encounter (HOSPITAL_COMMUNITY): Payer: Self-pay | Admitting: Pharmacy Technician

## 2021-01-01 DIAGNOSIS — I10 Essential (primary) hypertension: Secondary | ICD-10-CM | POA: Insufficient documentation

## 2021-01-01 DIAGNOSIS — D259 Leiomyoma of uterus, unspecified: Secondary | ICD-10-CM | POA: Insufficient documentation

## 2021-01-01 DIAGNOSIS — Z79899 Other long term (current) drug therapy: Secondary | ICD-10-CM | POA: Insufficient documentation

## 2021-01-01 LAB — COMPREHENSIVE METABOLIC PANEL
ALT: 23 U/L (ref 0–44)
AST: 18 U/L (ref 15–41)
Albumin: 3.7 g/dL (ref 3.5–5.0)
Alkaline Phosphatase: 60 U/L (ref 38–126)
Anion gap: 13 (ref 5–15)
BUN: 9 mg/dL (ref 6–20)
CO2: 26 mmol/L (ref 22–32)
Calcium: 8.8 mg/dL — ABNORMAL LOW (ref 8.9–10.3)
Chloride: 92 mmol/L — ABNORMAL LOW (ref 98–111)
Creatinine, Ser: 0.55 mg/dL (ref 0.44–1.00)
GFR, Estimated: >60 mL/min (ref >60)
Glucose, Bld: 95 mg/dL (ref 70–99)
Potassium: 3.2 mmol/L — ABNORMAL LOW (ref 3.5–5.1)
Sodium: 131 mmol/L — ABNORMAL LOW (ref 135–145)
Total Bilirubin: 0.6 mg/dL (ref 0.3–1.2)
Total Protein: 7.5 g/dL (ref 6.5–8.1)

## 2021-01-01 LAB — CBC WITH DIFFERENTIAL/PLATELET
Abs Immature Granulocytes: 0.02 10*3/uL (ref 0.00–0.07)
Basophils Absolute: 0 10*3/uL (ref 0.0–0.1)
Basophils Relative: 0 %
Eosinophils Absolute: 0.1 10*3/uL (ref 0.0–0.5)
Eosinophils Relative: 1 %
HCT: 38.2 % (ref 36.0–46.0)
Hemoglobin: 13.1 g/dL (ref 12.0–15.0)
Immature Granulocytes: 0 %
Lymphocytes Relative: 26 %
Lymphs Abs: 1.8 10*3/uL (ref 0.7–4.0)
MCH: 25.8 pg — ABNORMAL LOW (ref 26.0–34.0)
MCHC: 34.3 g/dL (ref 30.0–36.0)
MCV: 75.2 fL — ABNORMAL LOW (ref 80.0–100.0)
Monocytes Absolute: 0.8 10*3/uL (ref 0.1–1.0)
Monocytes Relative: 11 %
Neutro Abs: 4.4 10*3/uL (ref 1.7–7.7)
Neutrophils Relative %: 62 %
Platelets: 316 10*3/uL (ref 150–400)
RBC: 5.08 MIL/uL (ref 3.87–5.11)
RDW: 13.3 % (ref 11.5–15.5)
WBC: 7 10*3/uL (ref 4.0–10.5)
nRBC: 0 % (ref 0.0–0.2)

## 2021-01-01 MED ORDER — IBUPROFEN 800 MG PO TABS
800.0000 mg | ORAL_TABLET | Freq: Once | ORAL | Status: AC
  Administered 2021-01-01: 800 mg via ORAL
  Filled 2021-01-01: qty 1

## 2021-01-01 MED ORDER — NAPROXEN 500 MG PO TABS
500.0000 mg | ORAL_TABLET | Freq: Two times a day (BID) | ORAL | 2 refills | Status: DC
  Filled 2021-01-01: qty 30, 15d supply, fill #0
  Filled 2021-01-15: qty 30, 15d supply, fill #1

## 2021-01-01 NOTE — ED Provider Notes (Signed)
Emergency Medicine Provider Triage Evaluation Note  Cindy Garrison , a 50 y.o. female  was evaluated in triage.  Pt complains of ongoing pelvic and lower abdominal pain.  She has been present for over a week.  She has been seen by OB/GYN for this, based on chart review it appears they were concerned for developing malignancy and gave her recommendation for MRI and possibly biopsy. She states she does not go back to see them till about 4 months. She reports that the pain is exactly the same as it has been before but is not as severe. She states that she was given medications however those caused her to have a rash.  She denies any fevers, nausea, or vomiting no bleeding..  Review of Systems  Positive: See above Negative:   Physical Exam  BP 118/84    Pulse 73    Temp 98.9 F (37.2 C)    Resp 18    LMP 09/12/2017 (LMP Unknown)    SpO2 100%  Gen:   Awake, no distress   Resp:  Normal effort  MSK:   Moves extremities without difficulty  Other:  Patient is awake and alert.  Normal speech.  Medical Decision Making  Medically screening exam initiated at 6:05 PM.  Appropriate orders placed.  Louis Matte Fay was informed that the remainder of the evaluation will be completed by another provider, this initial triage assessment does not replace that evaluation, and the importance of remaining in the ED until their evaluation is complete.  Patient presents today for evaluation of ongoing pelvic and lower abdominal pain.  It appears she was seen by OB/GYN for this a few months ago and the recommended biopsy, it does not appear that she has gotten the biopsy. She states the pain is exactly the same but not as severe as it has been however still bad enough to keep her from working. Will obtain basic labs.  UA.  Note: Portions of this report may have been transcribed using voice recognition software. Every effort was made to ensure accuracy; however, inadvertent computerized transcription errors may  be present    Ollen Gross 01/01/21 1807    Wyvonnia Dusky, MD 01/01/21 [phone]

## 2021-01-01 NOTE — ED Provider Notes (Signed)
[facility] Provider Note  CSN: [id number] Arrival date & time: 01/01/21 1355    History Chief Complaint  Patient presents with   Abdominal Pain   History obtained video language interpreter  Cindy Garrison is a 50 y.o. female with known history of fibroid uterus, followed by Gyn, offered hysterectomy in the past but had declined. At most recent Gyn appointment she has agreed to proceed with surgery but needed an MRI prior and did not show up for scheduled imaging study in November. She is here today for typical lower abdominal pain. No vomiting, fever, bleeding or dysuria.    Past Medical History:  Diagnosis Date   #[id number]    Acne    Back pain    Essential hypertension    Seasonal allergies    Syncope 09/16/2014   Syncope and collapse 09/16/2014    Past Surgical History:  Procedure Laterality Date   DILITATION & CURRETTAGE/HYSTROSCOPY WITH NOVASURE ABLATION N/A 12/22/2017   Procedure: DILATATION & CURETTAGE/ HYSTEROSCOPY WITH ENDOMETRIAL FIBROID AND  NOVASURE ABLATION;  Surgeon: Lavonia Drafts, MD;  Location: [facility];  Service: Gynecology;  Laterality: N/A;    Family History  Problem Relation Age of Onset   Breast cancer Sister     Social History   Tobacco Use   Smoking status: Never   Smokeless tobacco: Never  Vaping Use   Vaping Use: Never used  Substance Use Topics   Alcohol use: Not Currently    Comment: occ.   Drug use: No     Home Medications Prior to Admission medications   Medication Sig Start Date End Date Taking? Authorizing Provider  acetaminophen (TYLENOL) 500 MG tablet Take 500 mg by mouth every 6 (six) hours as needed for mild pain or fever.   Yes [provider]  amLODipine (NORVASC) 5 MG tablet Take 1 tablet (5 mg total) by mouth daily. 11/24/20 11/24/21 Yes Elsie Stain, MD  atorvastatin (LIPITOR) 20 MG tablet Take 1 tablet (20 mg total) by mouth daily. 11/24/20 11/24/21 Yes Elsie Stain, MD   haloperidol (HALDOL) 2 MG tablet TAKE 1 TABLET (2 MG TOTAL) BY MOUTH AT BEDTIME. Patient taking differently: Take 2 mg by mouth at bedtime. 10/30/20 10/30/21 Yes Eulis Canner E, NP  norethindrone (AYGESTIN) 5 MG tablet TAKE 1 TABLET(5 MG) BY MOUTH DAILY Patient taking differently: Take 5 mg by mouth daily. 11/30/20  Yes Donnamae Jude, MD  valsartan-hydrochlorothiazide (DIOVAN-HCT) 160-25 MG tablet Take 1 tablet by mouth daily. 11/24/20  Yes Elsie Stain, MD  Vitamin D, Ergocalciferol, (DRISDOL) 1.25 MG (50000 UNIT) CAPS capsule Take 1 capsule (50,000 Units total) by mouth every 7 (seven) days. Wednesday 11/24/20 11/24/21 Yes Elsie Stain, MD  bacitracin ointment Apply 1 application topically 2 (two) times daily. Patient not taking: Reported on 11/24/2020 11/10/20   Griffin Basil, MD  naproxen (NAPROSYN) 500 MG tablet Take 1 tablet (500 mg total) by mouth 2 (two) times daily with a meal. 01/01/21   Truddie Hidden, MD  hydrochlorothiazide (HYDRODIURIL) 25 MG tablet Take 1 tablet (25 mg total) by mouth daily. 09/20/19 01/23/20  Charlott Rakes, MD  lisinopril (ZESTRIL) 40 MG tablet TAKE 1 TABLET (40 MG TOTAL) BY MOUTH DAILY. TO LOWER BLOOD PRESSURE 01/08/20 01/23/20  Charlott Rakes, MD  omeprazole (PRILOSEC) 40 MG capsule Take 1 capsule (40 mg total) by mouth 2 (two) times daily before a meal. 12/28/18 12/29/18  Antony Blackbird, MD     Allergies  Other   Review of Systems   Review of Systems A comprehensive review of systems was completed and negative except as noted in HPI.    Physical Exam BP 120/80 (BP Location: Right Arm)    Pulse 79    Temp 97.8 F (36.6 C) (Oral)    Resp 16    LMP 09/12/2017 (LMP Unknown)    SpO2 99%   Physical Exam Vitals and nursing note reviewed.  Constitutional:      Appearance: Normal appearance.  HENT:     Head: Normocephalic and atraumatic.     Nose: Nose normal.     Mouth/Throat:     Mouth: Mucous membranes are moist.  Eyes:      Extraocular Movements: Extraocular movements intact.     Conjunctiva/sclera: Conjunctivae normal.  Cardiovascular:     Rate and Rhythm: Normal rate.  Pulmonary:     Effort: Pulmonary effort is normal.     Breath sounds: Normal breath sounds.  Abdominal:     General: Abdomen is flat.     Palpations: Abdomen is soft.     Tenderness: There is abdominal tenderness.     Comments: Markedly enlarged fibroid uterus  Musculoskeletal:        General: No swelling. Normal range of motion.     Cervical back: Neck supple.  Skin:    General: Skin is warm and dry.  Neurological:     General: No focal deficit present.     Mental Status: She is alert.  Psychiatric:        Mood and Affect: Mood normal.     ED Results / Procedures / Treatments   Labs (all labs ordered are listed, but only abnormal results are displayed) Labs Reviewed  COMPREHENSIVE METABOLIC PANEL - Abnormal; Notable for the following components:      Result Value   Sodium 131 (*)    Potassium 3.2 (*)    Chloride 92 (*)    Calcium 8.8 (*)    All other components within normal limits  CBC WITH DIFFERENTIAL/PLATELET - Abnormal; Notable for the following components:   MCV 75.2 (*)    MCH 25.8 (*)    All other components within normal limits  URINALYSIS, ROUTINE W REFLEX MICROSCOPIC    EKG None  Radiology No results found.  Procedures Procedures  Medications Ordered in the ED Medications  ibuprofen (ADVIL) tablet 800 mg (has no administration in time range)     MDM Rules/Calculators/A&P MDM Patient with known fibroid uterus, having some pain today but labs are unremarkable. No indicate for emergent imaging or intervention. She was encouraged to call MRI tomorrow to reschedule her test (number was provided to her) and to call her Gyn for long term management. Motrin as needed for pain.   ED Course  I have reviewed the triage vital signs and the nursing notes.  Pertinent labs & imaging results that were  available during my care of the patient were reviewed by me and considered in my medical decision making (see chart for details).     Final Clinical Impression(s) / ED Diagnoses Final diagnoses:  Uterine leiomyoma, unspecified location    Rx / DC Orders ED Discharge Orders          Ordered    naproxen (NAPROSYN) 500 MG tablet  2 times daily with meals        01/01/21 2032             Truddie Hidden, MD 01/01/21 2032

## 2021-01-01 NOTE — ED Triage Notes (Addendum)
Via interpreter pt with lower abdominal pain onset last night. Pt denies urinary symptoms, denies fevers, nausea, vomiting or diarrhea. Seen in the past for same and dx with fibroids.

## 2021-01-01 NOTE — Discharge Instructions (Signed)
Please call the number listed for Radiology to have your MRI rescheduled as soon as possible. Follow up with Dr. Elgie Congo after your MRI to discuss surgery.

## 2021-01-02 ENCOUNTER — Other Ambulatory Visit: Payer: Self-pay

## 2021-01-06 ENCOUNTER — Other Ambulatory Visit: Payer: Self-pay

## 2021-01-06 ENCOUNTER — Telehealth (HOSPITAL_COMMUNITY): Payer: No Payment, Other | Admitting: Psychiatry

## 2021-01-07 ENCOUNTER — Other Ambulatory Visit: Payer: Self-pay

## 2021-01-08 ENCOUNTER — Other Ambulatory Visit: Payer: Self-pay

## 2021-01-15 ENCOUNTER — Other Ambulatory Visit: Payer: Self-pay

## 2021-01-21 ENCOUNTER — Other Ambulatory Visit: Payer: Self-pay

## 2021-01-26 ENCOUNTER — Other Ambulatory Visit: Payer: Self-pay

## 2021-01-27 ENCOUNTER — Other Ambulatory Visit: Payer: Self-pay

## 2021-01-27 ENCOUNTER — Encounter: Payer: Self-pay | Admitting: Critical Care Medicine

## 2021-01-27 ENCOUNTER — Ambulatory Visit: Payer: Self-pay | Attending: Critical Care Medicine | Admitting: Critical Care Medicine

## 2021-01-27 VITALS — BP 138/83 | HR 99 | Resp 16 | Wt 156.6 lb

## 2021-01-27 DIAGNOSIS — E7800 Pure hypercholesterolemia, unspecified: Secondary | ICD-10-CM

## 2021-01-27 DIAGNOSIS — Z1211 Encounter for screening for malignant neoplasm of colon: Secondary | ICD-10-CM

## 2021-01-27 DIAGNOSIS — D259 Leiomyoma of uterus, unspecified: Secondary | ICD-10-CM

## 2021-01-27 DIAGNOSIS — Z1231 Encounter for screening mammogram for malignant neoplasm of breast: Secondary | ICD-10-CM

## 2021-01-27 DIAGNOSIS — F259 Schizoaffective disorder, unspecified: Secondary | ICD-10-CM

## 2021-01-27 DIAGNOSIS — I10 Essential (primary) hypertension: Secondary | ICD-10-CM

## 2021-01-27 MED ORDER — HALOPERIDOL 2 MG PO TABS
ORAL_TABLET | ORAL | 3 refills | Status: DC
  Filled 2021-01-27: qty 30, fill #0

## 2021-01-27 MED ORDER — VITAMIN D (ERGOCALCIFEROL) 1.25 MG (50000 UNIT) PO CAPS
50000.0000 [IU] | ORAL_CAPSULE | ORAL | 4 refills | Status: DC
  Filled 2021-01-27: qty 16, 112d supply, fill #0
  Filled 2021-02-12: qty 4, 28d supply, fill #0
  Filled 2021-03-18: qty 4, 28d supply, fill #1
  Filled 2021-04-27: qty 4, 28d supply, fill #2

## 2021-01-27 MED ORDER — AMLODIPINE BESYLATE 5 MG PO TABS
5.0000 mg | ORAL_TABLET | Freq: Every day | ORAL | 2 refills | Status: DC
  Filled 2021-01-27: qty 90, 90d supply, fill #0
  Filled 2021-02-27: qty 30, 30d supply, fill #0
  Filled 2021-03-27: qty 30, 30d supply, fill #1
  Filled 2021-04-27: qty 30, 30d supply, fill #2

## 2021-01-27 MED ORDER — VALSARTAN-HYDROCHLOROTHIAZIDE 160-25 MG PO TABS
1.0000 | ORAL_TABLET | Freq: Every day | ORAL | 3 refills | Status: DC
  Filled 2021-01-27: qty 90, 90d supply, fill #0
  Filled 2021-02-18: qty 30, 30d supply, fill #0
  Filled 2021-03-27: qty 30, 30d supply, fill #1
  Filled 2021-04-27: qty 30, 30d supply, fill #2

## 2021-01-27 MED ORDER — ATORVASTATIN CALCIUM 20 MG PO TABS
20.0000 mg | ORAL_TABLET | Freq: Every day | ORAL | 2 refills | Status: DC
  Filled 2021-01-27: qty 90, 90d supply, fill #0
  Filled 2021-02-18: qty 30, 30d supply, fill #0
  Filled 2021-03-18: qty 30, 30d supply, fill #1
  Filled 2021-04-27: qty 30, 30d supply, fill #2

## 2021-01-27 NOTE — Assessment & Plan Note (Signed)
Blood pressure is at goal today. Continue with Valsartan-HCTZ 160-25 daily as well as amlodipine. Reviewed how and when to take medications, and confirmed that prior Valsartan-HCTZ dose should be discontinued.

## 2021-01-27 NOTE — Assessment & Plan Note (Addendum)
Patient has consulted with gynecology, and MRI was ordered. She has been unable to get this scan done, and per MRI facility, order is now expired.   Coordinate with gynecology group for scheduling.  Financial assistance discussed as well.

## 2021-01-27 NOTE — Progress Notes (Signed)
Established Patient Office Visit  Subjective:  Patient ID: Cindy Garrison, female    DOB: 30-Jun-1970  Age: 51 y.o. MRN: [id number]  CC:  Chief Complaint  Patient presents with   Hypertension    HPI Cindy Garrison presents for follow up after ED visit and for hypertension follow up. [place] interpretation services were used, with [place], [phone].   The patient visited the ED for the ongoing issue of lower abdominal pain. This is attributed to the uterine leiomyoma She states that the pain has actually improved recently. The pain is bilateral and she notices it on her left and right sides. She admits some vaginal pain that she has noticed when wiping after urinating. She denies any vaginal discharge or bleeding. An MRI was ordered by gynecology (Dr. Elgie Congo) to assess the uterine leiomyoma, with plans for surgery. The patient is unsure of what the MRI is and has not been able to have the imaging done yet. She would appreciate assistance with scheduling this scan. It is vital that this scan be completed.  Patient has diagnosis of schizoaffective disorder, for which she takes haloperidol (Haldol). She has established care with behavioral health, Eulis Canner, NP.  Since her last visit here, she has been able to get a screening mammogram which was negative. She does need a colon cancer screening.   The patient's blood pressure is at therapeutic goal today. She has been confused regarding the dosage of the Valsartan-HCTZ, but should now be taking the higher dose as prescribed last time. She has reported some dizziness since last visit, but denies any palpitations. She has no marked edema.    Past Medical History:  Diagnosis Date   #[id number]    Acne    Back pain    Essential hypertension    Seasonal allergies    Syncope 09/16/2014   Syncope and collapse 09/16/2014    Past Surgical History:  Procedure Laterality Date   DILITATION & CURRETTAGE/HYSTROSCOPY WITH NOVASURE ABLATION N/A  12/22/2017   Procedure: DILATATION & CURETTAGE/ HYSTEROSCOPY WITH ENDOMETRIAL FIBROID AND  NOVASURE ABLATION;  Surgeon: Lavonia Drafts, MD;  Location: [facility];  Service: Gynecology;  Laterality: N/A;    Family History  Problem Relation Age of Onset   Breast cancer Sister     Social History   Socioeconomic History   Marital status: Married    Spouse name: Not on file   Number of children: Not on file   Years of education: Not on file   Highest education level: Not on file  Occupational History   Not on file  Tobacco Use   Smoking status: Never   Smokeless tobacco: Never  Vaping Use   Vaping Use: Never used  Substance and Sexual Activity   Alcohol use: Not Currently    Comment: occ.   Drug use: No   Sexual activity: Not on file    Comment: monogamous female partner for 5 years (2010)  Other Topics Concern   Not on file  Social History Narrative   Not on file   Social Determinants of Health   Financial Resource Strain: Not on file  Food Insecurity: Not on file  Transportation Needs: Not on file  Physical Activity: Not on file  Stress: Not on file  Social Connections: Not on file  Intimate Partner Violence: Not on file    Outpatient Medications Prior to Visit  Medication Sig Dispense Refill   amLODipine (NORVASC) 5 MG tablet Take 1 tablet (5 mg total) by mouth  daily. 90 tablet 2   atorvastatin (LIPITOR) 20 MG tablet Take 1 tablet (20 mg total) by mouth daily. 90 tablet 2   haloperidol (HALDOL) 2 MG tablet TAKE 1 TABLET (2 MG TOTAL) BY MOUTH AT BEDTIME. 30 tablet 3   naproxen (NAPROSYN) 500 MG tablet Take 1 tablet (500 mg total) by mouth 2 (two) times daily with a meal. 30 tablet 2   valsartan-hydrochlorothiazide (DIOVAN-HCT) 160-25 MG tablet Take 1 tablet by mouth daily. 90 tablet 3   Vitamin D, Ergocalciferol, (DRISDOL) 1.25 MG (50000 UNIT) CAPS capsule Take 1 capsule (50,000 Units total) by mouth every 7 (seven) days. Wednesday 16 capsule 4   acetaminophen  (TYLENOL) 500 MG tablet Take 500 mg by mouth every 6 (six) hours as needed for mild pain or fever. (Patient not taking: Reported on 01/27/2021)     bacitracin ointment Apply 1 application topically 2 (two) times daily. (Patient not taking: Reported on 11/24/2020) 120 g 1   norethindrone (AYGESTIN) 5 MG tablet TAKE 1 TABLET(5 MG) BY MOUTH DAILY 30 tablet 0   No facility-administered medications prior to visit.    Allergies  Allergen Reactions   Other Rash    Blood pressure pill (unknown name)    ROS Review of Systems  Constitutional:  Positive for fatigue.  HENT:  Positive for rhinorrhea.   Eyes:  Positive for visual disturbance (can't see well).  Respiratory: Negative.    Cardiovascular: Negative.  Negative for palpitations.  Gastrointestinal: Negative.   Genitourinary:  Positive for dysuria (occasional pain with wiping) and pelvic pain. Negative for vaginal bleeding and vaginal discharge.  Neurological:  Positive for dizziness.  Psychiatric/Behavioral:  Positive for sleep disturbance (difficulty falling asleep).      Objective:    Physical Exam Constitutional:      Appearance: Normal appearance.  HENT:     Head: Normocephalic and atraumatic.     Mouth/Throat:     Mouth: Mucous membranes are moist.     Dentition: Normal dentition (dentition fair).     Pharynx: Oropharynx is clear. Uvula midline.  Cardiovascular:     Rate and Rhythm: Normal rate and regular rhythm.     Heart sounds: Normal heart sounds. No murmur heard.   No friction rub. No gallop.  Pulmonary:     Effort: Pulmonary effort is normal.     Breath sounds: Normal breath sounds. No wheezing, rhonchi or rales.  Abdominal:     General: There is distension.     Tenderness: There is abdominal tenderness.  Skin:    General: Skin is warm and dry.  Neurological:     General: No focal deficit present.     Mental Status: She is alert.  Psychiatric:        Mood and Affect: Mood normal.        Behavior: Behavior  normal.    BP 138/83    Pulse 99    Resp 16    Wt 156 lb 9.6 oz (71 kg)    LMP 09/12/2017 (LMP Unknown)    SpO2 96%    BMI 29.59 kg/m  Wt Readings from Last 3 Encounters:  01/27/21 156 lb 9.6 oz (71 kg)  11/24/20 149 lb 3.2 oz (67.7 kg)  11/10/20 148 lb 3.2 oz (67.2 kg)     Health Maintenance Due  Topic Date Due   COVID-19 Vaccine (2 - Booster for Janssen series) 05/19/2020   COLON CANCER SCREENING ANNUAL FOBT  01/24/2021    There are no preventive care  reminders to display for this patient.  Lab Results  Component Value Date   TSH 1.260 06/20/2019   Lab Results  Component Value Date   WBC 7.0 01/01/2021   HGB 13.1 01/01/2021   HCT 38.2 01/01/2021   MCV 75.2 (L) 01/01/2021   PLT 316 01/01/2021   Lab Results  Component Value Date   NA 131 (L) 01/01/2021   K 3.2 (L) 01/01/2021   CO2 26 01/01/2021   GLUCOSE 95 01/01/2021   BUN 9 01/01/2021   CREATININE 0.55 01/01/2021   BILITOT 0.6 01/01/2021   ALKPHOS 60 01/01/2021   AST 18 01/01/2021   ALT 23 01/01/2021   PROT 7.5 01/01/2021   ALBUMIN 3.7 01/01/2021   CALCIUM 8.8 (L) 01/01/2021   ANIONGAP 13 01/01/2021   Lab Results  Component Value Date   CHOL 159 01/23/2020   Lab Results  Component Value Date   HDL 58 01/23/2020   Lab Results  Component Value Date   LDLCALC 90 01/23/2020   Lab Results  Component Value Date   TRIG 54 01/23/2020   Lab Results  Component Value Date   CHOLHDL 2.7 01/23/2020   Lab Results  Component Value Date   HGBA1C 5.5 01/24/2018      Assessment & Plan:   Problem List Items Addressed This Visit       Cardiovascular and Mediastinum   Essential hypertension    Blood pressure is at goal today. Continue with Valsartan-HCTZ 160-25 daily as well as amlodipine. Reviewed how and when to take medications, and confirmed that prior Valsartan-HCTZ dose should be discontinued.      Relevant Medications   amLODipine (NORVASC) 5 MG tablet   atorvastatin (LIPITOR) 20 MG tablet    valsartan-hydrochlorothiazide (DIOVAN-HCT) 160-25 MG tablet     Genitourinary   Fibroid uterus    Patient has consulted with gynecology, and MRI was ordered. She has been unable to get this scan done, and per MRI facility, order is now expired.   Coordinate with gynecology group for scheduling.  Financial assistance discussed as well.        Other   Schizoaffective disorder ([place])    Continue with Haldol, f/u as planned with mental health specialists.      Relevant Medications   haloperidol (HALDOL) 2 MG tablet   Pure hypercholesterolemia    Continue on statin therapy.       Relevant Medications   amLODipine (NORVASC) 5 MG tablet   atorvastatin (LIPITOR) 20 MG tablet   valsartan-hydrochlorothiazide (DIOVAN-HCT) 160-25 MG tablet   Colon cancer screening - Primary    Prescription for FOBT provided for patient to complete.      Relevant Orders   Fecal occult blood, imunochemical   Screening mammogram for breast cancer    Patient has received her mammogram, it was negative.       Meds ordered this encounter  Medications   amLODipine (NORVASC) 5 MG tablet    Sig: Take 1 tablet (5 mg total) by mouth daily.    Dispense:  90 tablet    Refill:  2   atorvastatin (LIPITOR) 20 MG tablet    Sig: Take 1 tablet (20 mg total) by mouth daily.    Dispense:  90 tablet    Refill:  2   haloperidol (HALDOL) 2 MG tablet    Sig: TAKE 1 TABLET (2 MG TOTAL) BY MOUTH AT BEDTIME.    Dispense:  30 tablet    Refill:  3   valsartan-hydrochlorothiazide (DIOVAN-HCT)  160-25 MG tablet    Sig: Take 1 tablet by mouth daily.    Dispense:  90 tablet    Refill:  3   Vitamin D, Ergocalciferol, (DRISDOL) 1.25 MG (50000 UNIT) CAPS capsule    Sig: Take 1 capsule (50,000 Units total) by mouth every 7 (seven) days. Wednesday    Dispense:  16 capsule    Refill:  4    Follow-up: Return in about 4 months (around 05/27/2021).    Asencion Noble, MD

## 2021-01-27 NOTE — Assessment & Plan Note (Signed)
Patient has received her mammogram, it was negative.

## 2021-01-27 NOTE — Assessment & Plan Note (Signed)
Continue with Haldol, f/u as planned with mental health specialists.

## 2021-01-27 NOTE — Assessment & Plan Note (Signed)
Prescription for FOBT provided for patient to complete.

## 2021-01-27 NOTE — Patient Instructions (Signed)
Refills on all your medications sent to our pharmacy which is at a new location [address] in the [facility] first floor  Your MRI of your abdomen and pelvis will be ordered please follow through on this  Please process another application for [name] discount and financial assistance so that the MRI can be paid for you met with clinical financial counselor today Cindy Garrison please process the paperwork and then get an appointment with him as soon as possible  Once the MRI is done we will have you follow back up with Cindy Garrison of gynecology  A colon cancer screening kit was issued at the lab today please process and bring it back  Return to Cindy Garrison 4 months   Recharges sur tous vos mdicaments envoys  notre pharmacie qui se trouve dans un nouvel emplacement [address] au premier tage du [place] abdomen et de votre bassin sera commande, veuillez suivre ce qui suit  [employer] traiter une autre demande de rduction de [facility] et d'aide financire afin que l'IRM puisse tre paye pour vous rencontr aujourd'hui Cindy Garrison conseiller financier [place], Psychologist, counselling traiter les documents, puis obtenir un rendez-vous avec lui ds que possible  Une fois l'IRM termine, nous vous ferons suivre Cindy Garrison [name] de gyncologie  Une trousse de dpistage du cancer du clon a t distribue au laboratoire aujourd'hui, veuillez la traiter et la rapporter  Retour au Dr [name] [facility]

## 2021-01-27 NOTE — Assessment & Plan Note (Signed)
Continue on statin therapy. 

## 2021-01-29 ENCOUNTER — Other Ambulatory Visit: Payer: Self-pay

## 2021-01-29 ENCOUNTER — Ambulatory Visit (INDEPENDENT_AMBULATORY_CARE_PROVIDER_SITE_OTHER): Payer: No Payment, Other | Admitting: Psychiatry

## 2021-01-29 ENCOUNTER — Encounter (HOSPITAL_COMMUNITY): Payer: Self-pay | Admitting: Psychiatry

## 2021-01-29 DIAGNOSIS — F259 Schizoaffective disorder, unspecified: Secondary | ICD-10-CM | POA: Diagnosis not present

## 2021-01-29 MED ORDER — HALOPERIDOL 1 MG PO TABS
3.0000 mg | ORAL_TABLET | Freq: Every day | ORAL | 3 refills | Status: DC
  Filled 2021-01-29: qty 90, 30d supply, fill #0
  Filled 2021-03-27: qty 90, 30d supply, fill #1

## 2021-01-29 MED ORDER — TRAZODONE HCL 50 MG PO TABS
25.0000 mg | ORAL_TABLET | Freq: Every day | ORAL | 3 refills | Status: DC
Start: 2021-01-29 — End: 2021-05-07
  Filled 2021-01-29: qty 30, 30d supply, fill #0

## 2021-01-29 NOTE — Progress Notes (Signed)
BH MD/PA/NP OP Progress Note   01/29/2021 10:30 AM CAROLLYNN PENNYWELL  MRN:  [id number]  Chief Complaint: "Sometime I hear noise"  Chief Complaint   Medication Management     HPI: 51 year old female seen today for f/u psychiatric evaluation. She haas a psychiatric history of schizoaffective disorder. She is currently managed on Haldol 2mg  QD. Patient notes that her medication regimen has continued to be effective.  Today she is well-groomed, pleasant, cooperative, engaged in conversation and maintained eye contact.  She informed Probation officer that she continues to have hallucinations and reports hearing noises.  Patient had a list of things that she hears constantly, which includes voices saying you are dead, [employer], no problem, things are normal, you and your husband are roommate, I am smart, [place], [place], you live between [place] and [place].  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  She reports that work is essential because she and her husband are having financial difficulties.  She reports she applied for disability 2 years ago however that is still pending and reports that she cannot work over 40 hours.  She asked Probation officer for resources for rental assistance and food.  Provider gave patient numbers to [employer] in [place].    Patient notes that the above exacerbates her anxiety and depression.  Provider conducted a GAD-7 and patient scored an 18.  Provider also conducted PHQ-9 and patient scored 18.  She notes that her sleep is poor reporting that she sleeps 4 hours nightly.  Today she denies SI/HI/VH, mania, or paranoia.    Patient notes that she continues to have abdominal pain.  She reports that she is scheduled for surgery soon.    Today patient agreeable to increasing Haldol 2 mg to 3 mg to help manage symptoms of psychosis.  She informed writer that 4 mg and 5 mg were  over sedating.  She will also start trazodone 25 to 50 mg nightly as needed to help manage sleep.  Provider offered patient hydroxyzine to help manage anxiety however she notes that she was not interested.  Potential side effects of medication and risks vs benefits of treatment vs non-treatment were explained and discussed. All questions were answered. Patient also asked provider to write a note to social services telling them about her mental health condition.  Provider was agreeable to this request.  No other concerns at this time.       Visit Diagnosis:    ICD-10-CM   1. Schizoaffective disorder, unspecified type ([place])  F25.9 haloperidol (HALDOL) 1 MG tablet    traZODone (DESYREL) 50 MG tablet      Past Psychiatric History: Schizoaffective disorder, Has been on Haldol for 10 years.  Was being seen at [facility] in the past  Past Medical History:  Past Medical History:  Diagnosis Date   #[id number]    Acne    Back pain    Essential hypertension    Seasonal allergies    Syncope 09/16/2014   Syncope and collapse 09/16/2014    Past Surgical History:  Procedure Laterality Date   DILITATION & CURRETTAGE/HYSTROSCOPY WITH NOVASURE ABLATION N/A 12/22/2017   Procedure: DILATATION & CURETTAGE/ HYSTEROSCOPY WITH ENDOMETRIAL FIBROID AND  NOVASURE ABLATION;  Surgeon: Lavonia Drafts, MD;  Location: [facility];  Service: Gynecology;  Laterality: N/A;    Family Psychiatric History: denied Family History:  Family History  Problem Relation  Age of Onset   Breast cancer Sister     Social History:  Social History   Socioeconomic History   Marital status: Married    Spouse name: Not on file   Number of children: Not on file   Years of education: Not on file   Highest education level: Not on file  Occupational History   Not on file  Tobacco Use   Smoking status: Never   Smokeless tobacco: Never  Vaping Use   Vaping Use: Never used  Substance and Sexual Activity   Alcohol use: Not  Currently    Comment: occ.   Drug use: No   Sexual activity: Not on file    Comment: monogamous female partner for 5 years (2010)  Other Topics Concern   Not on file  Social History Narrative   Not on file   Social Determinants of Health   Financial Resource Strain: Not on file  Food Insecurity: Not on file  Transportation Needs: Not on file  Physical Activity: Not on file  Stress: Not on file  Social Connections: Not on file    Allergies:  Allergies  Allergen Reactions   Other Rash    Blood pressure pill (unknown name)    Metabolic Disorder Labs: Lab Results  Component Value Date   HGBA1C 5.5 01/24/2018   No results found for: PROLACTIN Lab Results  Component Value Date   CHOL 159 01/23/2020   TRIG 54 01/23/2020   HDL 58 01/23/2020   CHOLHDL 2.7 01/23/2020   LDLCALC 90 01/23/2020   LDLCALC 160 (H) 11/14/2017   Lab Results  Component Value Date   TSH 1.260 06/20/2019   TSH 1.840 08/24/2018    Therapeutic Level Labs: No results found for: LITHIUM No results found for: VALPROATE No components found for:  CBMZ  Current Medications: Current Outpatient Medications  Medication Sig Dispense Refill   traZODone (DESYREL) 50 MG tablet Take 0.5-1 tablets (25-50 mg total) by mouth at bedtime. 30 tablet 3   acetaminophen (TYLENOL) 500 MG tablet Take 500 mg by mouth every 6 (six) hours as needed for mild pain or fever. (Patient not taking: Reported on 01/27/2021)     amLODipine (NORVASC) 5 MG tablet Take 1 tablet (5 mg total) by mouth daily. 90 tablet 2   atorvastatin (LIPITOR) 20 MG tablet Take 1 tablet (20 mg total) by mouth daily. 90 tablet 2   bacitracin ointment Apply 1 application topically 2 (two) times daily. (Patient not taking: Reported on 11/24/2020) 120 g 1   haloperidol (HALDOL) 1 MG tablet Take 3 tablets (3 mg total) by mouth daily. 90 tablet 3   valsartan-hydrochlorothiazide (DIOVAN-HCT) 160-25 MG tablet Take 1 tablet by mouth daily. 90 tablet 3   Vitamin  D, Ergocalciferol, (DRISDOL) 1.25 MG (50000 UNIT) CAPS capsule Take 1 capsule (50,000 Units total) by mouth every 7 (seven) days. Wednesday 16 capsule 4   No current facility-administered medications for this visit.     Musculoskeletal: Strength & Muscle Tone: within normal limits Gait & Station: normal Patient leans: N/A  Psychiatric Specialty Exam: Review of Systems  Blood pressure 130/77, pulse 78, height 5\' 1"  (1.549 m), weight 155 lb (70.3 kg), last menstrual period 09/12/2017, SpO2 100 %.Body mass index is 29.29 kg/m.  General Appearance: Well Groomed  Eye Contact:  Good  Speech:  Clear and Coherent and Normal Rate  Volume:  Normal  Mood:  Anxious and Depressed  Affect:  Appropriate and Congruent  Thought Process:  Coherent, Goal  Directed, and Linear  Orientation:  Full (Time, Place, and Person)  Thought Content: Logical and Hallucinations: Auditory   Suicidal Thoughts:  No  Homicidal Thoughts:  No  Memory:  Immediate;   Good Recent;   Good Remote;   Good  Judgement:  Good  Insight:  Good  Psychomotor Activity:  Normal  Concentration:  Concentration: Good and Attention Span: Good  Recall:  Good  Fund of Knowledge: Good  Language: Good  Akathisia:  No  Handed:  Right  AIMS (if indicated): not done  Assets:  Communication Skills Desire for Improvement Financial Resources/Insurance Housing Intimacy Social Support  ADL's:  Intact  Cognition: WNL  Sleep:  Poor   Screenings: [place] Office Visit from 04/01/2020 in [place] Total Score 0      GAD-7    [place] from 01/29/2021 in [place] from 07/30/2020 in [facility] Office Visit from 04/09/2020 in [facility] for [place] at [facility] Office Visit from 01/23/2020 in [place] Office Visit from 12/17/2019  in [facility] for [place] at [facility]  Total GAD-7 Score 18 0 0 0 1      PHQ2-9    Flowsheet Row Clinical Support from 01/29/2021 in [facility] Office Visit from 11/24/2020 in [place] from 07/30/2020 in [facility] Office Visit from 06/24/2020 in [place] Office Visit from 04/09/2020 in [place] for [place] at [facility]  PHQ-2 Total Score 6 6 0 0 0  PHQ-9 Total Score 18 21 0 1 0      Flowsheet Row Clinical Support from 01/29/2021 in [facility] ED from 01/01/2021 in [place] ED from 11/06/2020 in [place] Error: Question 6 not populated No Risk No Risk        Assessment and Plan: Patient endorses symptoms of psychosis, anxiety, and depression (related to life stressors). Today patient agreeable to increasing Haldol 2 mg to 3 mg to help manage symptoms of psychosis.  She informed writer that 4 mg and 5 mg were over sedating.  She will also start trazodone 25 to 50 mg nightly as needed to help manage sleep.  Provider offered patient hydroxyzine to help manage anxiety however she notes that she was not interested. Patient also asked provider to write a note to social services telling them about her mental status.  Provider was agreeable to this request  1. Schizoaffective disorder, unspecified type (H[facility])  Increased- haloperidol (HALDOL) 1 MG tablet; Take 3 tablets (3 mg total) by mouth daily.  Dispense: 90 tablet; Refill: 3 Start- traZODone (DESYREL) 50 MG tablet; Take 0.5-1 tablets (25-50 mg total) by mouth at bedtime.  Dispense: 30 tablet; Refill: 3    Salley Slaughter, NP 01/29/2021, 10:30 AM

## 2021-01-30 ENCOUNTER — Other Ambulatory Visit: Payer: Self-pay | Admitting: Critical Care Medicine

## 2021-01-30 ENCOUNTER — Other Ambulatory Visit: Payer: Self-pay

## 2021-01-30 DIAGNOSIS — D259 Leiomyoma of uterus, unspecified: Secondary | ICD-10-CM

## 2021-01-30 LAB — FECAL OCCULT BLOOD, IMMUNOCHEMICAL: Fecal Occult Bld: NEGATIVE

## 2021-01-30 NOTE — Telephone Encounter (Signed)
Requested by interface surescripts. Medication discontinued 01/27/21.  Requested Prescriptions  Refused Prescriptions Disp Refills   naproxen (NAPROSYN) 500 MG tablet 30 tablet 2    Sig: Take 1 tablet (500 mg total) by mouth 2 (two) times daily with a meal.     Analgesics:  NSAIDS Passed - 01/30/2021  9:16 AM      Passed - Cr in normal range and within 360 days    Creat  Date Value Ref Range Status  02/23/2016 0.66 0.50 - 1.10 mg/dL Final   Creatinine, Ser  Date Value Ref Range Status  01/01/2021 0.55 0.44 - 1.00 mg/dL Final   Creatinine, Urine  Date Value Ref Range Status  02/23/2016 31 20 - 320 mg/dL Final         Passed - HGB in normal range and within 360 days    Hemoglobin  Date Value Ref Range Status  01/01/2021 13.1 12.0 - 15.0 g/dL Final  06/20/2019 12.7 11.1 - 15.9 g/dL Final         Passed - Patient is not pregnant      Passed - Valid encounter within last 12 months    Recent Outpatient Visits          3 days ago Essential hypertension   [place], MD   2 months ago Pure hypercholesterolemia   [place], MD   7 months ago Essential hypertension   [place], MD   10 months ago Essential hypertension   [place], Patrick E, MD   1 year ago Essential hypertension   [place], MD      Future Appointments            In 3 months Joya Gaskins Burnett Harry, MD [place]

## 2021-02-02 ENCOUNTER — Telehealth: Payer: Self-pay | Admitting: Critical Care Medicine

## 2021-02-02 ENCOUNTER — Telehealth: Payer: Self-pay

## 2021-02-02 NOTE — Telephone Encounter (Signed)
Pt was called and is aware of results, DOB was confirmed.  ?

## 2021-02-02 NOTE — Telephone Encounter (Signed)
-----   Message from Elsie Stain, MD sent at 02/01/2021  5:05 PM EST ----- Let pt know colon cancer screen negative rechek one year

## 2021-02-02 NOTE — Telephone Encounter (Signed)
[place]  you know ms cowart well  She wrote me a letter today indicating she desired a letter from [place] stating having a cat (one cat, not 74) in her [place] [employer] apt was medically mentally indicated for her GAD /anxiety /hoarding syndrome  She states she has done some work to [employer] her life  Would you be willing to give her such a letter  like a service animal type excuse to reduce her monthly rental that is upcharged if she has a Neurosurgeon

## 2021-02-02 NOTE — Telephone Encounter (Signed)
Absolutely! I'll do it before I leave today!

## 2021-02-03 ENCOUNTER — Other Ambulatory Visit: Payer: Self-pay | Admitting: Family Medicine

## 2021-02-03 ENCOUNTER — Other Ambulatory Visit: Payer: Self-pay

## 2021-02-03 DIAGNOSIS — D259 Leiomyoma of uterus, unspecified: Secondary | ICD-10-CM

## 2021-02-03 DIAGNOSIS — F259 Schizoaffective disorder, unspecified: Secondary | ICD-10-CM

## 2021-02-03 DIAGNOSIS — N882 Stricture and stenosis of cervix uteri: Secondary | ICD-10-CM

## 2021-02-03 MED ORDER — TRAMADOL HCL 50 MG PO TABS
50.0000 mg | ORAL_TABLET | Freq: Two times a day (BID) | ORAL | 0 refills | Status: AC | PRN
Start: 2021-02-03 — End: 2021-02-10
  Filled 2021-02-03: qty 14, 7d supply, fill #0

## 2021-02-03 NOTE — Progress Notes (Signed)
Patient came into front office to request refill of Tramadol for abdominal pain. Per chart review, pt was seen by Elgie Congo, MD on 11/10/20. Pt was to return for MRI in November prior to having US guided endometrial biopsy scheduled. Pt no showed MRI. Reviewed with Dione Plover MD who orders refill for 1 week supply of Tramadol and states pt needs to return ASAP to see provider. Appt scheduled for Elgie Congo, MD on 02/09/21. Rescheduled MRI for first available on 02/16/21. Calendar of appts printed and reviewed with patient. Conversation completed with [employer] interpreter [place] ID [phone]

## 2021-02-03 NOTE — Progress Notes (Signed)
Patient currently being worked up for pelvic mass by Dr. Elgie Congo.   Presented as walk in for RN visit reporting pain and requesting tramadol rx. Has not yet obtained pelvic MRI as previously requested by Dr. Elgie Congo.   Patient not see by me. Agreed to send one week rx for tramadol and have her see Dr. Elgie Congo at beginning of next week. PDMP checked with no opioid rx since limited amount given in 10/2020, overall no red flags.

## 2021-02-04 ENCOUNTER — Telehealth: Payer: Self-pay | Admitting: Critical Care Medicine

## 2021-02-04 ENCOUNTER — Ambulatory Visit: Payer: Self-pay

## 2021-02-04 ENCOUNTER — Other Ambulatory Visit: Payer: Self-pay

## 2021-02-04 NOTE — Telephone Encounter (Signed)
I called the Pt to inform her that she can not apply for financial assistance since she has Haematologist for this reason she is not qualified at this time, Appt will be cancel, LVM

## 2021-02-09 ENCOUNTER — Other Ambulatory Visit: Payer: Self-pay

## 2021-02-09 ENCOUNTER — Encounter: Payer: Self-pay | Admitting: Obstetrics and Gynecology

## 2021-02-09 ENCOUNTER — Ambulatory Visit (INDEPENDENT_AMBULATORY_CARE_PROVIDER_SITE_OTHER): Payer: 59 | Admitting: Obstetrics and Gynecology

## 2021-02-09 VITALS — BP 134/89 | HR 83 | Ht 61.0 in | Wt 150.7 lb

## 2021-02-09 DIAGNOSIS — Z9889 Other specified postprocedural states: Secondary | ICD-10-CM

## 2021-02-09 DIAGNOSIS — N882 Stricture and stenosis of cervix uteri: Secondary | ICD-10-CM | POA: Diagnosis not present

## 2021-02-09 DIAGNOSIS — D259 Leiomyoma of uterus, unspecified: Secondary | ICD-10-CM | POA: Diagnosis not present

## 2021-02-09 MED ORDER — MISOPROSTOL 100 MCG PO TABS
ORAL_TABLET | ORAL | 0 refills | Status: DC
  Filled 2021-02-09: qty 2, 1d supply, fill #0

## 2021-02-09 NOTE — Patient Instructions (Signed)
Please make sure you get your MRI on 02/16/21 and then come back foe endometrial biopsy the week of 2/6.  Please take the medication the night before and the morning of the procedure.

## 2021-02-09 NOTE — Progress Notes (Signed)
°  CC:   Subjective:    Patient ID: Cindy Garrison, female    DOB: [birth year], 51 y.o.   MRN: [id number]  HPI 51 yo G3P0 seen for follow up of fibroid uterus.  Pt has been lost to follow up for several months and also missed her previously scheduled MRI.  MRI scheduled due to concern for carcinomatosis from findings on previous CT scan.  Pt notes intermittent lower abdominal pain.  She states she notices it more after she has been very active.  Pt denies any vaginal bleeding since she had a uterine ablation in 2019.  Emphasized concern over large size and growth of uterine fibroids.   Review of Systems     Objective:   Physical Exam Vitals:   02/09/21 1317  BP: 134/89  Pulse: 83   CLINICAL DATA:  Abdominal pain and back pain for 1 month. Patient is postmenopausal.   EXAM: TRANSABDOMINAL AND TRANSVAGINAL ULTRASOUND OF PELVIS   TECHNIQUE: Both transabdominal and transvaginal ultrasound examinations of the pelvis were performed. Transabdominal technique was performed for global imaging of the pelvis including uterus, ovaries, adnexal regions, and pelvic cul-de-sac. It was necessary to proceed with endovaginal exam following the transabdominal exam to visualize the endometrium.   COMPARISON:  CT abdomen pelvis 09/19/2020   FINDINGS: Uterus   Measurements: 22 x 10.6 x 18.7 cm = volume: 131 mL. Enlarged uterus noted reaching above the umbilicus.   Endometrium   Thickness: Heterogeneous and thickened with fluid. Associated septations. Measures up to 10 cm.   Right ovary   Measurements: 4.6 x 3 x 4.2 cm = volume: 30 mL. Only seen on transabdominal images. Simple cystic lesion measuring up to 4.1 x 1.7 x 3.9 cm.   Left ovary   Measurements: 6.3 x 4.5 x 5.6 cm = volume: 82 mL. Only seen on transabdominal images. Cystic lesion measuring up to 5.3 x 2.7 x 4.9 cm with associated thick septation with vascularity. Another cystic lesion measuring 4.1 x 2.9 x 3.5 cm within  associated thin septation but no definite vascularity.   Other findings   No abnormal free fluid.   IMPRESSION: 1. Heterogeneous and markedly thickened endometrium with fluid (up to 10 cm). Associated enlarged uterus. Recommend gynecologic consultation. 2. Left ovarian cystic lesion measuring up to 5.3 cm with associated thick vascular septation/mural nodularity concerning for malignancy. Recommend gynecologic consultation. 3. Please note limited evaluation of the ovaries as these are only visualized on transabdominal view.       Assessment & Plan:   1. Uterine leiomyoma, unspecified location Emphasized due to recent CT findings the need for endometrial biopsy and MRI prior to surgery.  Findings concerning due to rapid growth and size of fibroids/uterus. MRI on 02/16/21 and endometrial biopsy the following week with misoprostol ripening  2. Cervical os stenosis Pretreat cervix with cytotec prior to  endometrial biopsy attempt  - misoprostol (CYTOTEC) 100 MCG tablet; 1 tab per vagina the night before and the morning of the procedure  Dispense: 2 tablet; Refill: 0  3. History of endometrial ablation   Follow up in 2-3 weeks to discuss MRI results and attempt endometrial biopsy.  I spent 20 minutes dedicated to the care of this patient including previsit review of records, face to face time with the patient discussing current disease etiology, treatment plan and post visit testing.     Griffin Basil, MD Faculty Attending, [facility] for [facility]

## 2021-02-10 ENCOUNTER — Other Ambulatory Visit: Payer: Self-pay

## 2021-02-12 ENCOUNTER — Other Ambulatory Visit: Payer: Self-pay

## 2021-02-16 ENCOUNTER — Ambulatory Visit (HOSPITAL_COMMUNITY): Admission: RE | Admit: 2021-02-16 | Payer: 59 | Source: Ambulatory Visit

## 2021-02-16 ENCOUNTER — Encounter (HOSPITAL_COMMUNITY): Payer: Self-pay

## 2021-02-16 ENCOUNTER — Ambulatory Visit (HOSPITAL_COMMUNITY)
Admission: RE | Admit: 2021-02-16 | Discharge: 2021-02-16 | Disposition: A | Discharge status: Home / Self Care | Payer: 59 | Source: Ambulatory Visit | Attending: Obstetrics and Gynecology | Admitting: Obstetrics and Gynecology

## 2021-02-16 ENCOUNTER — Other Ambulatory Visit: Payer: Self-pay

## 2021-02-16 DIAGNOSIS — N838 Other noninflammatory disorders of ovary, fallopian tube and broad ligament: Secondary | ICD-10-CM

## 2021-02-16 DIAGNOSIS — D259 Leiomyoma of uterus, unspecified: Secondary | ICD-10-CM

## 2021-02-18 ENCOUNTER — Other Ambulatory Visit: Payer: Self-pay

## 2021-02-23 ENCOUNTER — Other Ambulatory Visit: Payer: Self-pay

## 2021-02-23 ENCOUNTER — Encounter (HOSPITAL_COMMUNITY): Payer: Self-pay

## 2021-02-23 ENCOUNTER — Other Ambulatory Visit: Payer: Self-pay | Admitting: *Deleted

## 2021-02-23 ENCOUNTER — Telehealth: Payer: Self-pay

## 2021-02-23 ENCOUNTER — Telehealth: Payer: Self-pay | Admitting: *Deleted

## 2021-02-23 ENCOUNTER — Ambulatory Visit (HOSPITAL_COMMUNITY)
Admission: RE | Admit: 2021-02-23 | Discharge: 2021-02-23 | Disposition: A | Discharge status: Home / Self Care | Payer: 59 | Source: Ambulatory Visit | Attending: Obstetrics and Gynecology | Admitting: Obstetrics and Gynecology

## 2021-02-23 DIAGNOSIS — D259 Leiomyoma of uterus, unspecified: Secondary | ICD-10-CM

## 2021-02-23 DIAGNOSIS — R1900 Intra-abdominal and pelvic swelling, mass and lump, unspecified site: Secondary | ICD-10-CM

## 2021-02-23 NOTE — Telephone Encounter (Signed)
Centralized scheduling called to request confirmation of PA for Pelvic MRI scheduled for today. Reviewed documentation in Epic chart from 02/13/21 that states this has been approved.

## 2021-02-23 NOTE — Telephone Encounter (Signed)
Received call Radiology. Patient there and need orders. Radiology explained patient had MRI of pelvis started 02/16/21 but was stopped because they realized she had very large fibroids that extended into her abdomen and she needed MRI of pelvis and abdomen. They informed patient and told her to discuss with her doctor and was rescheduled. Today order that was in is not correct order - needs MRI of abdomen not MRI entero Abdomen. New order placed. Dr. Elgie Congo notified. Kristianne Albin,RN

## 2021-02-27 ENCOUNTER — Encounter: Payer: Self-pay | Admitting: Obstetrics and Gynecology

## 2021-02-27 ENCOUNTER — Other Ambulatory Visit: Payer: Self-pay

## 2021-02-27 ENCOUNTER — Ambulatory Visit (INDEPENDENT_AMBULATORY_CARE_PROVIDER_SITE_OTHER): Payer: 59 | Admitting: Obstetrics and Gynecology

## 2021-02-27 ENCOUNTER — Telehealth: Payer: Self-pay

## 2021-02-27 DIAGNOSIS — N882 Stricture and stenosis of cervix uteri: Secondary | ICD-10-CM

## 2021-02-27 DIAGNOSIS — D259 Leiomyoma of uterus, unspecified: Secondary | ICD-10-CM

## 2021-02-27 DIAGNOSIS — Z3202 Encounter for pregnancy test, result negative: Secondary | ICD-10-CM | POA: Diagnosis not present

## 2021-02-27 LAB — POCT PREGNANCY, URINE: Preg Test, Ur: NEGATIVE

## 2021-02-27 NOTE — Progress Notes (Signed)
ENDOMETRIAL BIOPSY      Cindy Garrison is a 51 y.o. G3P0030 here for endometrial biopsy.  The indications for endometrial biopsy were reviewed.  Risks of the biopsy including cramping, bleeding, infection, uterine perforation, inadequate specimen and need for additional procedures were discussed. The patient states she understands and agrees to undergo procedure today. Consent was signed. Time out was performed.   Indications: thickened endometrial stripe Urine HCG: negative  A bivalve speculum was placed into the vagina and the cervix was easily visualized and was prepped with Betadine x2. A single-toothed tenaculum was placed on the anterior lip of the cervix to stabilize it. I attempted to pass the biopsy  tube through the cervix and met significant resistance even though she had been pretreated with cytotec. I attempted to dilate the cervix, but the patient experienced significant discomfort.  The procedure was then discontinued.  The patient tolerated the procedure well. Routine post-procedure instructions were given to the patient.    Will base further management on results of pelvic/abdominal MRI Follow up in 3 weeks to discuss treatment options.  Lynnda Shields, MD

## 2021-03-01 ENCOUNTER — Ambulatory Visit (HOSPITAL_COMMUNITY)
Admission: RE | Admit: 2021-03-01 | Discharge: 2021-03-01 | Disposition: A | Discharge status: Home / Self Care | Payer: 59 | Source: Ambulatory Visit | Attending: Obstetrics and Gynecology | Admitting: Obstetrics and Gynecology

## 2021-03-01 ENCOUNTER — Other Ambulatory Visit: Payer: Self-pay | Admitting: Obstetrics and Gynecology

## 2021-03-01 ENCOUNTER — Other Ambulatory Visit: Payer: Self-pay

## 2021-03-01 DIAGNOSIS — R1900 Intra-abdominal and pelvic swelling, mass and lump, unspecified site: Secondary | ICD-10-CM

## 2021-03-01 DIAGNOSIS — N858 Other specified noninflammatory disorders of uterus: Secondary | ICD-10-CM | POA: Diagnosis not present

## 2021-03-01 DIAGNOSIS — D259 Leiomyoma of uterus, unspecified: Secondary | ICD-10-CM

## 2021-03-01 DIAGNOSIS — N1330 Unspecified hydronephrosis: Secondary | ICD-10-CM | POA: Diagnosis not present

## 2021-03-02 ENCOUNTER — Other Ambulatory Visit (HOSPITAL_COMMUNITY): Payer: 59

## 2021-03-02 NOTE — Telephone Encounter (Signed)
MRI appt rescheduled; pt will be notified at appt today.   Apolonio Schneiders RN 12/27/21

## 2021-03-03 ENCOUNTER — Telehealth: Payer: Self-pay | Admitting: *Deleted

## 2021-03-03 NOTE — Telephone Encounter (Signed)
I called patient with [place] # [phone] and we reached her voicemail. We left a message we are calling with non urgent information for her doctor. Please call our office. Clemens Lachman,RN

## 2021-03-03 NOTE — Telephone Encounter (Signed)
-----   Message from Osborne Oman, MD sent at 03/03/2021 12:26 PM EST ----- Patient has several fibroids, largest about 4.6 cm but has a large collection of blood products in uterus which is causing the uterus to be so large. The collection measures 22 x 14 x 10 cm and the whole uterus measures 24 x 16 x 11 cm.  She can discuss further surgical management for this hematometra during her appointment with Dr. Kennon Rounds on 03/20/21 as scheduled (or if Dr. Elgie Congo has an earlier appointment available).

## 2021-03-04 NOTE — Telephone Encounter (Signed)
Called pt with [place] #  [phone] and left message that this is our second attempt in trying to reach you if you could please contact the office for results.  Letter sent.    Frances Nickels  03/04/21

## 2021-03-18 ENCOUNTER — Other Ambulatory Visit: Payer: Self-pay

## 2021-03-19 ENCOUNTER — Telehealth: Payer: Self-pay

## 2021-03-19 NOTE — Telephone Encounter (Signed)
VM left with [place] interpreter ID [phone] with reminder to take Cytotec tonight and tomorrow morning prior to appt for endometrial biopsy. ?

## 2021-03-20 ENCOUNTER — Other Ambulatory Visit: Payer: Self-pay

## 2021-03-20 ENCOUNTER — Encounter: Payer: Self-pay | Admitting: Family Medicine

## 2021-03-20 ENCOUNTER — Ambulatory Visit (INDEPENDENT_AMBULATORY_CARE_PROVIDER_SITE_OTHER): Payer: 59 | Admitting: Family Medicine

## 2021-03-20 VITALS — BP 120/79 | HR 81 | Wt 146.1 lb

## 2021-03-20 DIAGNOSIS — D259 Leiomyoma of uterus, unspecified: Secondary | ICD-10-CM

## 2021-03-20 DIAGNOSIS — N857 Hematometra: Secondary | ICD-10-CM | POA: Diagnosis not present

## 2021-03-20 DIAGNOSIS — Z1231 Encounter for screening mammogram for malignant neoplasm of breast: Secondary | ICD-10-CM | POA: Diagnosis not present

## 2021-03-20 NOTE — Progress Notes (Signed)
? ?  Subjective:  ? ? Patient ID: Cindy Garrison is a 51 y.o. female presenting with Procedure ? on 03/20/2021 ? ?HPI: ?Patient as a h/o Novasure ablation in 2019. Now comes in with enlarging uterus and stenotic cervix c/w hematometra. MRI suggests hematosalpinx. Uterus is now 24 week size with the endometrial cavity measuring 22 cm. She denies vaginal bleeding or pain of any kind. ? ? ?Review of Systems  ?Constitutional:  Negative for chills and fever.  ?Respiratory:  Negative for shortness of breath.   ?Cardiovascular:  Negative for chest pain.  ?Gastrointestinal:  Negative for abdominal pain, nausea and vomiting.  ?Genitourinary:  Negative for dysuria.  ?Skin:  Negative for rash.  ?   ?Objective:  ?  ?BP 120/79   Pulse 81   Wt 146 lb 1.6 oz (66.3 kg)   LMP 09/12/2017 (LMP Unknown)   BMI 27.61 kg/m?  ?Physical Exam ?Exam conducted with a chaperone present.  ?Constitutional:   ?   General: She is not in acute distress. ?   Appearance: She is well-developed.  ?HENT:  ?   Head: Normocephalic and atraumatic.  ?Eyes:  ?   General: No scleral icterus. ?Cardiovascular:  ?   Rate and Rhythm: Normal rate.  ?Pulmonary:  ?   Effort: Pulmonary effort is normal.  ?Abdominal:  ?   Palpations: Abdomen is soft. There is mass (24 wk size uterus).  ?Musculoskeletal:  ?   Cervical back: Neck supple.  ?Skin: ?   General: Skin is warm and dry.  ?Neurological:  ?   Mental Status: She is alert and oriented to person, place, and time.  ? ?MRI 03/02/2021 ?Uterus measures approximately 11.3 x 16.3 x 24 cm in ?AP, transverse and craniocaudal dimensions. Several hypointense T2 ?signal likely uterine fibroids identified measuring up to 4.6 x 2.4 ?cm on the left. The endometrium is markedly distended with ?homogeneous hyperintense T1 and hyperintense T2 signal material, ?likely blood products, measuring approximally 10.6 x 13.7 x 22 cm in ?AP, transverse and craniocaudal dimensions. Indeterminate ?heterogeneity of the cervix measuring  approximately 3 x 2.4 cm best ?seen on the sagittal T2 sequence. Vaginal canal appears within ?normal limits. ? ?IMPRESSION: ?1. Hematometra with marked distension of the endometrial cavity and ?enlargement of the uterus, suggesting obstruction at the level of ?the cervix. There is an area of heterogeneity in the cervix which is ?indeterminate and not well evaluated due to motion/lack of contrast, ?recommend direct visualization/gynecologic evaluation. ?2. Ovaries are not well evaluated. There is evidence of bilateral ?hematosalpinx and likely simple and hemorrhagic ovarian cysts, left ?greater than right. ?3. No ascites or lymphadenopathy identified. ?4. Uterine fibroids. ?5. Mild right hydronephrosis similar to previous study, possibly ?secondary to uterine mass effect on the distal ureter. ? ?   ?Assessment & Plan:  ? ?Problem List Items Addressed This Visit   ? ?  ? Unprioritized  ? Fibroid uterus  ? Screening mammogram for breast cancer - Primary  ? Hematometra  ?  Will review with partners to decide on next steps. Would try to avoid an open procedure if able. Check [facility] to see if close to menopause. ?She is interested in treatment options. ?  ?  ? Relevant Orders  ? [place] (Completed)  ? ? ? ?Return in about 4 weeks (around 04/17/2021) for a follow-up to discuss surgery. ? ?Donnamae Jude, MD ?03/20/2021 ?10:56 AM ? ? ?

## 2021-03-21 LAB — FOLLICLE STIMULATING HORMONE: FSH: 13.3 m[IU]/mL

## 2021-03-23 ENCOUNTER — Encounter: Payer: Self-pay | Admitting: Family Medicine

## 2021-03-23 NOTE — Assessment & Plan Note (Addendum)
Will review with partners to decide on next steps. Would try to avoid an open procedure if able. Check FSH to see if close to menopause. ?She is interested in treatment options. ?

## 2021-03-27 ENCOUNTER — Other Ambulatory Visit: Payer: Self-pay

## 2021-04-08 ENCOUNTER — Ambulatory Visit: Payer: 59 | Admitting: Obstetrics and Gynecology

## 2021-04-08 ENCOUNTER — Other Ambulatory Visit: Payer: Self-pay

## 2021-04-15 ENCOUNTER — Encounter (HOSPITAL_COMMUNITY): Payer: No Payment, Other | Admitting: Psychiatry

## 2021-04-16 ENCOUNTER — Encounter: Payer: Self-pay | Admitting: Family Medicine

## 2021-04-16 ENCOUNTER — Ambulatory Visit (INDEPENDENT_AMBULATORY_CARE_PROVIDER_SITE_OTHER): Payer: 59 | Admitting: Family Medicine

## 2021-04-16 VITALS — BP 126/82 | HR 80

## 2021-04-16 DIAGNOSIS — N857 Hematometra: Secondary | ICD-10-CM | POA: Diagnosis not present

## 2021-04-16 MED ORDER — ORILISSA 200 MG PO TABS: 1.0000 | ORAL_TABLET | Freq: Every day | ORAL | 3 refills | Status: DC

## 2021-04-16 NOTE — Progress Notes (Signed)
? ?  Subjective:  ? ? Patient ID: Cindy Garrison is a 51 y.o. female presenting with No chief complaint on file. ? on 04/16/2021 ? ?[place] interpreter: in person used ? ?HPI: ?Patient is here with on-going large hematometra. This is secondary to previous Novasure. She has reported no pain at present and wants to explore options. At her last visit, we discussed option of draining the uterus and/or hysterectomy. I reviewed with several partners, options for surgery or the like. Her uterus is 22 wk size with 20 cm of hematometra. She had a normal FSH and is 51 years old. ? ?Review of Systems  ?Constitutional:  Negative for chills and fever.  ?Respiratory:  Negative for shortness of breath.   ?Cardiovascular:  Negative for chest pain.  ?Gastrointestinal:  Negative for abdominal pain, nausea and vomiting.  ?Genitourinary:  Negative for dysuria.  ?Skin:  Negative for rash.  ?   ?Objective:  ?  ?BP 126/82   Pulse 80   LMP 09/12/2017 (LMP Unknown)  ?Physical Exam ?Exam conducted with a chaperone present.  ?Constitutional:   ?   General: She is not in acute distress. ?   Appearance: She is well-developed.  ?HENT:  ?   Head: Normocephalic and atraumatic.  ?Eyes:  ?   General: No scleral icterus. ?Cardiovascular:  ?   Rate and Rhythm: Normal rate.  ?Pulmonary:  ?   Effort: Pulmonary effort is normal.  ?Abdominal:  ?   Palpations: Abdomen is soft.  ?Musculoskeletal:  ?   Cervical back: Neck supple.  ?Skin: ?   General: Skin is warm and dry.  ?Neurological:  ?   Mental Status: She is alert and oriented to person, place, and time.  ? ? ? ?   ?Assessment & Plan:  ? ?Problem List Items Addressed This Visit   ? ?  ? Unprioritized  ? Hematometra - Primary  ?  Given option of cervical opening to drain hematometra, drain and then hyst (likely robotic), or meds--Orilissa for a few years to achieve menopause and allow the uterus to stop bleeding and resorb. Risks of each reviewed. She would like to try the medication first. ?Rx sent  in for her. ?  ?  ? Relevant Medications  ? Elagolix Sodium (ORILISSA) 200 MG TABS  ? ? ?No follow-ups on file. ? ?Donnamae Jude, MD ?04/16/2021 ?4:18 PM ? ? ?

## 2021-04-17 NOTE — Assessment & Plan Note (Signed)
Given option of cervical opening to drain hematometra, drain and then hyst (likely robotic), or meds--Orilissa for a few years to achieve menopause and allow the uterus to stop bleeding and resorb. Risks of each reviewed. She would like to try the medication first. ?Rx sent in for her. ?

## 2021-04-21 NOTE — Progress Notes (Signed)
Patient came into front office for assistance with Orlissa. Unsure how to remove pills from package. Reviewed with patient. Also reviewed prescription is for 1 daily, not 2 daily as package directs. ?

## 2021-04-27 ENCOUNTER — Other Ambulatory Visit: Payer: Self-pay

## 2021-05-07 ENCOUNTER — Ambulatory Visit (INDEPENDENT_AMBULATORY_CARE_PROVIDER_SITE_OTHER): Payer: No Payment, Other | Admitting: Psychiatry

## 2021-05-07 ENCOUNTER — Other Ambulatory Visit: Payer: Self-pay

## 2021-05-07 DIAGNOSIS — F259 Schizoaffective disorder, unspecified: Secondary | ICD-10-CM

## 2021-05-07 MED ORDER — HALOPERIDOL 1 MG PO TABS
3.0000 mg | ORAL_TABLET | Freq: Every day | ORAL | 2 refills | Status: DC
  Filled 2021-05-07 – 2021-07-03 (×2): qty 90, 30d supply, fill #0

## 2021-05-07 MED ORDER — TRAZODONE HCL 50 MG PO TABS
25.0000 mg | ORAL_TABLET | Freq: Every day | ORAL | 2 refills | Status: DC
  Filled 2021-05-07: qty 30, 30d supply, fill #0

## 2021-05-07 NOTE — Progress Notes (Signed)
BH MD/PA/NP OP Progress Note ? ?05/07/2021 2:15 PM ?Cindy Matte Mooers  ?MRN:  [id number] ? ?Virtual Visit via Telephone Note ? ?I connected with Cindy Garrison on 05/07/21 at 11:30 AM EDT by telephone and verified that I am speaking with the correct person using two identifiers. ? ?Location: ?Patient: Home ?Provider: Offsite ?  ?I discussed the limitations, risks, security and privacy concerns of performing an evaluation and management service by telephone and the availability of in person appointments. I also discussed with the patient that there may be a patient responsible charge related to this service. The patient expressed understanding and agreed to proceed. ? ?  ?I discussed the assessment and treatment plan with the patient. The patient was provided an opportunity to ask questions and all were answered. The patient agreed with the plan and demonstrated an understanding of the instructions. ?  ?The patient was advised to call back or seek an in-person evaluation if the symptoms worsen or if the condition fails to improve as anticipated. ? ?I provided 5 minutes of non-face-to-face time during this encounter. ? ? ?Franne Grip, NP  ? ?Chief Complaint: Medication management ? ?HPI: Cindy Garrison is a 51 year old female presenting to [facility] behavioral health outpatient for follow-up psychiatric evaluation.  She has a psychiatric history of schizoaffective disorder and her symptoms are managed with Haldol 3 mg daily and trazodone 25 to 50 mg daily at bedtime.  Patient reports that medications are effective with managing her symptoms and she is medication compliant.  Patient denies adverse medication effects or need for dosage adjustment today.  No medication changes today. ? ?Visit Diagnosis:  ?  ICD-10-CM   ?1. Schizoaffective disorder, unspecified type ([place])  F25.9 haloperidol (HALDOL) 1 MG tablet  ?  traZODone (DESYREL) 50 MG tablet  ?  ? ? ?Past Psychiatric History: Schizoaffective  disorder ? ?Past Medical History:  ?Past Medical History:  ?Diagnosis Date  ? #[id number]   ? Acne   ? Back pain   ? Essential hypertension   ? Seasonal allergies   ? Syncope 09/16/2014  ? Syncope and collapse 09/16/2014  ?  ?Past Surgical History:  ?Procedure Laterality Date  ? DILITATION & CURRETTAGE/HYSTROSCOPY WITH NOVASURE ABLATION N/A 12/22/2017  ? Procedure: DILATATION & CURETTAGE/ HYSTEROSCOPY WITH ENDOMETRIAL FIBROID AND  NOVASURE ABLATION;  Surgeon: Lavonia Drafts, MD;  Location: [facility] [facility];  Service: Gynecology;  Laterality: N/A;  ? ? ?Family Psychiatric History: None known ? ?Family History:  ?Family History  ?Problem Relation Age of Onset  ? Breast cancer Sister   ? ? ?Social History:  ?Social History  ? ?Socioeconomic History  ? Marital status: Married  ?  Spouse name: Not on file  ? Number of children: Not on file  ? Years of education: Not on file  ? Highest education level: Not on file  ?Occupational History  ? Not on file  ?Tobacco Use  ? Smoking status: Never  ? Smokeless tobacco: Never  ?Vaping Use  ? Vaping Use: Never used  ?Substance and Sexual Activity  ? Alcohol use: Not Currently  ?  Comment: occ.  ? Drug use: No  ? Sexual activity: Not on file  ?  Comment: monogamous female partner for 5 years (2010)  ?Other Topics Concern  ? Not on file  ?Social History Narrative  ? Not on file  ? ?Social Determinants of Health  ? ?Financial Resource Strain: Not on file  ?Food Insecurity: No Food Insecurity  ? Worried About [employer] of  Food in the Last Year: Never true  ? Ran Out of Food in the Last Year: Never true  ?Transportation Needs: No Transportation Needs  ? Lack of Transportation (Medical): No  ? Lack of Transportation (Non-Medical): No  ?Physical Activity: Not on file  ?Stress: Not on file  ?Social Connections: Not on file  ? ? ?Allergies:  ?Allergies  ?Allergen Reactions  ? Other Rash  ?  Blood pressure pill (unknown name)  ? ? ?Metabolic Disorder Labs: ?Lab Results  ?Component Value Date  ?  HGBA1C 5.5 01/24/2018  ? ?No results found for: PROLACTIN ?Lab Results  ?Component Value Date  ? CHOL 159 01/23/2020  ? TRIG 54 01/23/2020  ? HDL 58 01/23/2020  ? CHOLHDL 2.7 01/23/2020  ? [place] 90 01/23/2020  ? LDLCALC 160 (H) 11/14/2017  ? ?Lab Results  ?Component Value Date  ? TSH 1.260 06/20/2019  ? TSH 1.840 08/24/2018  ? ? ?Therapeutic Level Labs: ?No results found for: LITHIUM ?No results found for: VALPROATE ?No components found for:  CBMZ ? ?Current Medications: ?Current Outpatient Medications  ?Medication Sig Dispense Refill  ? acetaminophen (TYLENOL) 500 MG tablet Take 500 mg by mouth every 6 (six) hours as needed for mild pain or fever. (Patient not taking: Reported on 01/27/2021)    ? amLODipine (NORVASC) 5 MG tablet Take 1 tablet (5 mg total) by mouth daily. 90 tablet 2  ? atorvastatin (LIPITOR) 20 MG tablet Take 1 tablet (20 mg total) by mouth daily. 90 tablet 2  ? bacitracin ointment Apply 1 application topically 2 (two) times daily. (Patient not taking: Reported on 03/20/2021) 120 g 1  ? Elagolix Sodium (ORILISSA) 200 MG TABS Take 1 tablet by mouth daily. 60 tablet 3  ? haloperidol (HALDOL) 1 MG tablet Take 3 tablets (3 mg total) by mouth daily. 90 tablet 2  ? traZODone (DESYREL) 50 MG tablet Take 0.5-1 tablets (25-50 mg total) by mouth at bedtime. 30 tablet 2  ? valsartan-hydrochlorothiazide (DIOVAN-HCT) 160-25 MG tablet Take 1 tablet by mouth daily. 90 tablet 3  ? Vitamin D, Ergocalciferol, (DRISDOL) 1.25 MG (50000 UNIT) CAPS capsule Take 1 capsule (50,000 Units total) by mouth every 7 (seven) days on Wednesday 16 capsule 4  ? ?No current facility-administered medications for this visit.  ? ? ? ?Musculoskeletal: ?Strength & Muscle Tone: N/A virtual visit ?Gait & Station: N/A ?Patient leans: N/A ? ?Psychiatric Specialty Exam: ?Review of Systems  ?Psychiatric/Behavioral:  Negative for hallucinations, self-injury and suicidal ideas.   ?All other systems reviewed and are negative.  ?Last menstrual  period 09/12/2017.There is no height or weight on file to calculate BMI.  ?General Appearance: N/A  ?Eye Contact: N/A  ?Speech: Clear and coherent  ?Volume: Normal  ?Mood: Euthymic  ?Affect: N/A  ?Thought Process: Goal directed  ?Orientation:  Full (Time, Place, and Person)  ?Thought Content: Logical   ?Suicidal Thoughts:  No  ?Homicidal Thoughts:  No  ?Memory: Good  ?Judgement: Good  ?Insight: Good  ?Psychomotor Activity: N/A  ?Concentration: Good  ?Recall:  Good  ?Fund of Knowledge: Good  ?Language: Good  ?Akathisia:  NA  ?Handed:  Right  ?AIMS (if indicated): not done  ?Assets:  Communication Skills  ?ADL's:  Intact  ?Cognition: WNL  ?Sleep:  Good  ? ?Screenings: ?AIMS   ? ?[place] Office Visit from 04/01/2020 in [facility]  ?AIMS Total Score 0  ? ?  ? ?GAD-7   ? ?[place] Office Visit from 03/20/2021 in [place]  for [employer] at [facility] Clinical Support from 01/29/2021 in [place] from 07/30/2020 in [facility] Office Visit from 04/09/2020 in [facility] Office Visit from 01/23/2020 in [place]  ?Total GAD-7 Score 8 18 0 0 0  ? ?  ? ?PHQ2-9   ? ?[place] Office Visit from 03/20/2021 in [place] for [employer] at [facility] for [facility] Clinical Support from 01/29/2021 in [facility] Office Visit from 11/24/2020 in [place] from 07/30/2020 in [facility] Office Visit from 06/24/2020 in [place]  ?PHQ-2 Total Score '2 6 6 '$ 0 0  ?PHQ-9 Total Score '2 18 21 '$ 0 1  ? ?  ? ?Flowsheet Row Clinical Support from 01/29/2021 in [facility] ED from 01/01/2021 in [place] ED from  11/06/2020 in [place]  ?C-SSRS RISK CATEGORY Error: Question 6 not populated No Risk No Risk  ? ?  ? ? ? ?Assessment and Plan: Cindy Garrison is a 51 year old female presenting to [place]

## 2021-05-14 ENCOUNTER — Other Ambulatory Visit: Payer: Self-pay

## 2021-05-26 ENCOUNTER — Encounter: Payer: Self-pay | Admitting: General Practice

## 2021-05-26 NOTE — Progress Notes (Unsigned)
Patient came by office stating she is having an allergic reaction to [place]. Patient is adamant that she has not started any other new medicines, lotions/creams, or came into contact with something outside. Patient has large area on trunk and side of neck with multiple small bumps that she reports as itchy. Patient states this started when she began the second pack of orilissa. She states she has already stopped the medication. Discussed with patient she can try taking zyrtec or use hydrocortisone cream on the areas that are itchy but it may take a few days for it to go away. Patient verbalized understanding & will follow up with Dr Kennon Rounds at her appt on 6/1. ? ?Koren Bound RN BSN ?05/26/21 ? ?

## 2021-05-28 ENCOUNTER — Encounter: Payer: Self-pay | Admitting: Critical Care Medicine

## 2021-05-28 ENCOUNTER — Other Ambulatory Visit: Payer: Self-pay

## 2021-05-28 ENCOUNTER — Ambulatory Visit: Payer: 59 | Attending: Critical Care Medicine | Admitting: Critical Care Medicine

## 2021-05-28 ENCOUNTER — Telehealth: Payer: Self-pay

## 2021-05-28 VITALS — BP 115/79 | HR 72 | Wt 148.8 lb

## 2021-05-28 DIAGNOSIS — Z59869 Financial insecurity, unspecified: Secondary | ICD-10-CM | POA: Insufficient documentation

## 2021-05-28 DIAGNOSIS — J301 Allergic rhinitis due to pollen: Secondary | ICD-10-CM

## 2021-05-28 DIAGNOSIS — E559 Vitamin D deficiency, unspecified: Secondary | ICD-10-CM

## 2021-05-28 DIAGNOSIS — L700 Acne vulgaris: Secondary | ICD-10-CM | POA: Insufficient documentation

## 2021-05-28 DIAGNOSIS — Z5986 Financial insecurity: Secondary | ICD-10-CM

## 2021-05-28 DIAGNOSIS — Z9181 History of falling: Secondary | ICD-10-CM | POA: Diagnosis not present

## 2021-05-28 DIAGNOSIS — E782 Mixed hyperlipidemia: Secondary | ICD-10-CM | POA: Diagnosis not present

## 2021-05-28 DIAGNOSIS — I10 Essential (primary) hypertension: Secondary | ICD-10-CM | POA: Diagnosis not present

## 2021-05-28 DIAGNOSIS — R42 Dizziness and giddiness: Secondary | ICD-10-CM

## 2021-05-28 DIAGNOSIS — L98491 Non-pressure chronic ulcer of skin of other sites limited to breakdown of skin: Secondary | ICD-10-CM

## 2021-05-28 DIAGNOSIS — Z603 Acculturation difficulty: Secondary | ICD-10-CM | POA: Insufficient documentation

## 2021-05-28 DIAGNOSIS — Z5941 Food insecurity: Secondary | ICD-10-CM | POA: Diagnosis not present

## 2021-05-28 DIAGNOSIS — F259 Schizoaffective disorder, unspecified: Secondary | ICD-10-CM | POA: Diagnosis not present

## 2021-05-28 DIAGNOSIS — E7800 Pure hypercholesterolemia, unspecified: Secondary | ICD-10-CM | POA: Diagnosis not present

## 2021-05-28 DIAGNOSIS — R69 Illness, unspecified: Secondary | ICD-10-CM | POA: Diagnosis not present

## 2021-05-28 DIAGNOSIS — Z758 Other problems related to medical facilities and other health care: Secondary | ICD-10-CM

## 2021-05-28 DIAGNOSIS — N857 Hematometra: Secondary | ICD-10-CM | POA: Diagnosis not present

## 2021-05-28 DIAGNOSIS — Z789 Other specified health status: Secondary | ICD-10-CM | POA: Insufficient documentation

## 2021-05-28 MED ORDER — CLINDAMYCIN PHOS-BENZOYL PEROX 1-5 % EX GEL
Freq: Two times a day (BID) | CUTANEOUS | 0 refills | Status: DC
  Filled 2021-05-28: qty 25, 30d supply, fill #0

## 2021-05-28 MED ORDER — AMLODIPINE BESYLATE 10 MG PO TABS
10.0000 mg | ORAL_TABLET | Freq: Every day | ORAL | 2 refills | Status: DC
  Filled 2021-05-28: qty 30, 30d supply, fill #0

## 2021-05-28 MED ORDER — ATORVASTATIN CALCIUM 20 MG PO TABS
20.0000 mg | ORAL_TABLET | Freq: Every day | ORAL | 2 refills | Status: DC
  Filled 2021-05-28: qty 30, 30d supply, fill #0
  Filled 2021-07-03: qty 30, 30d supply, fill #1
  Filled 2021-07-30: qty 30, 30d supply, fill #2
  Filled 2021-09-01: qty 30, 30d supply, fill #3
  Filled 2021-10-01: qty 30, 30d supply, fill #4
  Filled 2021-10-28: qty 30, 30d supply, fill #5
  Filled 2021-12-04: qty 30, 30d supply, fill #6
  Filled 2022-01-06: qty 30, 30d supply, fill #7

## 2021-05-28 MED ORDER — CETIRIZINE HCL 10 MG PO TABS
10.0000 mg | ORAL_TABLET | Freq: Every day | ORAL | 11 refills | Status: DC
  Filled 2021-05-28: qty 30, 30d supply, fill #0

## 2021-05-28 MED ORDER — VITAMIN D (ERGOCALCIFEROL) 1.25 MG (50000 UNIT) PO CAPS
50000.0000 [IU] | ORAL_CAPSULE | ORAL | 4 refills | Status: DC
  Filled 2021-05-28: qty 4, 28d supply, fill #0
  Filled 2021-07-03: qty 4, 28d supply, fill #1
  Filled 2021-07-30: qty 4, 28d supply, fill #2
  Filled 2021-09-01: qty 4, 28d supply, fill #3
  Filled 2021-10-01: qty 4, 28d supply, fill #4
  Filled 2021-10-28: qty 4, 28d supply, fill #5
  Filled 2021-12-04: qty 4, 28d supply, fill #6
  Filled 2022-01-06: qty 4, 28d supply, fill #7

## 2021-05-28 NOTE — Assessment & Plan Note (Signed)
Difficulty paying bills difficulty affording medication co-pays difficulty finding food and affording food ?

## 2021-05-28 NOTE — Telephone Encounter (Signed)
Message received from Dr Joya Gaskins noting patient is having  food insecurity and financial insecurity and is looking for resources.   ? ?I called patient with assistance of [place] interpreter, Social research officer, government # [phone] [employer].  He said that the patient speaks very broken [place] and is difficult to understand. ? ?I asked about her need for assistance with obtaining food. She said that she needs to eat special food and has a lot of medications and wants an increase in her pay at work so she can cook and make food for herself.  She was adamant that she does not want food, she just wants money. She said she won't take food. She wants more money from work. She went on to say that she has worked at [employer] for 7 years and has not received an increase in her pay.  She said she fell at work and they told her that if they have to call an ambulance, they will deduct that cost from her paycheck.  I asked if she has discussed her concerns with her employer and she said she wants the hospital to do that. I said that she needs to speak to the employer herself about an increase in her pay. She said that " the hospital is the government" and and should speak to her company which is also the government.  I explained that the hospital is not the government but multiple times she continued to refer to the hospital as the government. I offered to give her the number for Legal Aid noting that they may not be able to help with employment issues but may be able to refer to to an agency that can. She said she was given a number to call. She was not sure what agency and no one ever answered. I encouraged her to call Legal Aid herself because she needs to explain her concerns. She eventually agreed to take the phone number for Legal Aid and correctly repeated it back # [phone]. ? ?She then said that she needs medication for an allergic reaction. She has a rash on her neck that itches.  When asked if she used any cream, she responded that  the cream is for the rash and she needs something for the allergic reaction.  ? ?The interpreter noted multiple times that the patient was difficult to understand due to her broken [place] ?

## 2021-05-28 NOTE — Assessment & Plan Note (Signed)
Stable on Haldol encouraged to stay on current doses per mental health ?

## 2021-05-28 NOTE — Assessment & Plan Note (Signed)
Involving both cheeks and temple areas ? ?Possible side effects from use of Orilissa ? ?Patient's been holding [place] ? ?Begin BenzaClin to affected areas on the face ?

## 2021-05-28 NOTE — Assessment & Plan Note (Signed)
Patient's not able to access healthy foods states she has limited finances is seeking some type of assistance where she is working is not providing enough for her she does have insurance but has trouble with co-pays for her medications ?

## 2021-05-28 NOTE — Assessment & Plan Note (Signed)
Likely at risk for falling due to blood pressure running too low and the current issues in the uterus ? ?Plan to discontinue valsartan HCT and reassess metabolic panel and blood counts ?

## 2021-05-28 NOTE — Assessment & Plan Note (Addendum)
Not able to tolerate Freida Busman will notify gynecology, no patient has already notified the nurse at the clinic ? ?Patient encouraged to keep June 1 appointment with Dr. Kennon Rounds ? ?With dizziness and lower blood pressure ranges will check CBC ?

## 2021-05-28 NOTE — Assessment & Plan Note (Signed)
Patient to continue weekly vitamin D ?

## 2021-05-28 NOTE — Assessment & Plan Note (Signed)
Blood pressure ranging to low plan to stop valsartan HCT and begin increased dose amlodipine 10 mg daily ? ?Check metabolic panel ?

## 2021-05-28 NOTE — Assessment & Plan Note (Signed)
Reassess lipid panel and refill atorvastatin and check liver function ?

## 2021-05-28 NOTE — Progress Notes (Addendum)
? ?Established Patient Office Visit ? ?Subjective:  ?Patient ID: Cindy Garrison, female    DOB: [birth year]  Age: 51 y.o. MRN: [id number] ? ?CC:  ?No chief complaint on file. ? ? ?HPI ?01/2021 ?Cindy Garrison presents for follow up after ED visit and for hypertension follow up. [place] interpretation services were used, with [place], [phone].  ? ?The patient visited the ED for the ongoing issue of lower abdominal pain. This is attributed to the uterine leiomyoma She states that the pain has actually improved recently. The pain is bilateral and she notices it on her left and right sides. She admits some vaginal pain that she has noticed when wiping after urinating. She denies any vaginal discharge or bleeding. An MRI was ordered by gynecology (Dr. Elgie Congo) to assess the uterine leiomyoma, with plans for surgery. The patient is unsure of what the MRI is and has not been able to have the imaging done yet. She would appreciate assistance with scheduling this scan. It is vital that this scan be completed. ? ?Patient has diagnosis of schizoaffective disorder, for which she takes haloperidol (Haldol). She has established care with behavioral health, Eulis Canner, NP. ? ?Since her last visit here, she has been able to get a screening mammogram which was negative. She does need a colon cancer screening.  ? ?The patient's blood pressure is at therapeutic goal today. She has been confused regarding the dosage of the Valsartan-HCTZ, but should now be taking the higher dose as prescribed last time. She has reported some dizziness since last visit, but denies any palpitations. She has no marked edema.  ? ?05/28/2021 ?Patient seen in return follow-up and this visit was assisted by video interpreter [place] for [place] [phone] ?The patient has been seen by gynecology's on several occasions since I last saw her in January.  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  This is the cause of her pelvic pain.  She was begun on  Orilissa to see if this would cause decreased uterine bleeding and facilitate absorption of the hematometra ?The patient took about a pack and a half of the medication given to her and has not been able to complete she had excess sweating and increased acne on the face and the gluteal area.  She also had other side effects she did not like. ? ?Patient has return visit June 1 with Dr. Kennon Rounds to discuss further options. ?Patient also has fallen twice at work and on arrival blood pressure is 115/79.  We had recently increased her blood pressure medicine to add valsartan HCT along with amlodipine and I think this may be too much medication for this patient but tickly with the ongoing issues in the uterus. ? ?Patient also has increased allergies and picked up a box of Allegra-D I indicated to her that she should not be taking any allergy medicines with the components.  We can get her prescription version of Zyrtec. ? ?There are no other complaints. ? ?MRI done in February has been reviewed and documented as below ? ?Patient complains of allergic rhinitis worse with the increased pollen were currently experiencing she picked up an [place] from the pharmacy and I told her to stop this because of the D component ? ?Past Medical History:  ?Diagnosis Date  ? #[id number]   ? Acne   ? Back pain   ? Essential hypertension   ? Seasonal allergies   ? Syncope 09/16/2014  ? Syncope and collapse 09/16/2014  ? ? ?Past Surgical  History:  ?Procedure Laterality Date  ? DILITATION & CURRETTAGE/HYSTROSCOPY WITH NOVASURE ABLATION N/A 12/22/2017  ? Procedure: DILATATION & CURETTAGE/ HYSTEROSCOPY WITH ENDOMETRIAL FIBROID AND  NOVASURE ABLATION;  Surgeon: Lavonia Drafts, MD;  Location: [facility] [place];  Service: Gynecology;  Laterality: N/A;  ? ? ?Family History  ?Problem Relation Age of Onset  ? Breast cancer Sister   ? ? ?Social History  ? ?Socioeconomic History  ? Marital status: Married  ?  Spouse name: Not on file  ? Number of children: Not on  file  ? Years of education: Not on file  ? Highest education level: Not on file  ?Occupational History  ? Not on file  ?Tobacco Use  ? Smoking status: Never  ? Smokeless tobacco: Never  ?Vaping Use  ? Vaping Use: Never used  ?Substance and Sexual Activity  ? Alcohol use: Not Currently  ?  Comment: occ.  ? Drug use: No  ? Sexual activity: Not on file  ?  Comment: monogamous female partner for 5 years (2010)  ?Other Topics Concern  ? Not on file  ?Social History Narrative  ? Not on file  ? ?Social Determinants of Health  ? ?Financial Resource Strain: Not on file  ?Food Insecurity: No Food Insecurity  ? Worried About [employer] fundraiser in the Last Year: Never true  ? Ran Out of Food in the Last Year: Never true  ?Transportation Needs: No Transportation Needs  ? Lack of Transportation (Medical): No  ? Lack of Transportation (Non-Medical): No  ?Physical Activity: Not on file  ?Stress: Not on file  ?Social Connections: Not on file  ?Intimate Partner Violence: Not on file  ? ? ?Outpatient Medications Prior to Visit  ?Medication Sig Dispense Refill  ? acetaminophen (TYLENOL) 500 MG tablet Take 500 mg by mouth every 6 (six) hours as needed for mild pain or fever.    ? haloperidol (HALDOL) 1 MG tablet Take 3 tablets (3 mg total) by mouth daily. 90 tablet 2  ? traZODone (DESYREL) 50 MG tablet Take 0.5-1 tablets (25-50 mg total) by mouth at bedtime. 30 tablet 2  ? amLODipine (NORVASC) 5 MG tablet Take 1 tablet (5 mg total) by mouth daily. 90 tablet 2  ? atorvastatin (LIPITOR) 20 MG tablet Take 1 tablet (20 mg total) by mouth daily. 90 tablet 2  ? bacitracin ointment Apply 1 application topically 2 (two) times daily. 120 g 1  ? valsartan-hydrochlorothiazide (DIOVAN-HCT) 160-25 MG tablet Take 1 tablet by mouth daily. 90 tablet 3  ? Vitamin D, Ergocalciferol, (DRISDOL) 1.25 MG (50000 UNIT) CAPS capsule Take 1 capsule (50,000 Units total) by mouth every 7 (seven) days on Wednesday 16 capsule 4  ? Elagolix Sodium (ORILISSA) 200 MG  TABS Take 1 tablet by mouth daily. (Patient not taking: Reported on 05/28/2021) 60 tablet 3  ? ?No facility-administered medications prior to visit.  ? ? ?Allergies  ?Allergen Reactions  ? Other Rash  ?  Blood pressure pill (unknown name)  ? ? ?ROS ?Review of Systems  ?Constitutional:  Positive for fatigue.  ?HENT:  Positive for postnasal drip and rhinorrhea.   ?Eyes:  Negative for visual disturbance.  ?Respiratory: Negative.    ?Cardiovascular: Negative.  Negative for palpitations.  ?Gastrointestinal: Negative.   ?Genitourinary:  Positive for pelvic pain. Negative for dysuria (occasional pain with wiping), vaginal bleeding and vaginal discharge.  ?Skin:  Positive for rash.  ?     Increased acne around the cheeks  ?Neurological:  Positive for dizziness.  ?  Frequent falls  ?Psychiatric/Behavioral:  Negative for sleep disturbance.   ? ?  ?Objective:  ?  ?Physical Exam ?Constitutional:   ?   Appearance: Normal appearance.  ?HENT:  ?   Head: Normocephalic and atraumatic.  ?   Mouth/Throat:  ?   Mouth: Mucous membranes are moist.  ?   Dentition: Normal dentition (dentition fair).  ?   Pharynx: Oropharynx is clear. Uvula midline.  ?Cardiovascular:  ?   Rate and Rhythm: Normal rate and regular rhythm.  ?   Heart sounds: Normal heart sounds. No murmur heard. ?  No friction rub. No gallop.  ?Pulmonary:  ?   Effort: Pulmonary effort is normal.  ?   Breath sounds: Normal breath sounds. No wheezing, rhonchi or rales.  ?Abdominal:  ?   General: There is distension.  ?   Tenderness: There is abdominal tenderness.  ?Skin: ?   General: Skin is warm and dry.  ?   Findings: Rash present.  ?   Comments: Acneiform rash in both cheeks and lateral to both eyes in the temples  ?Neurological:  ?   General: No focal deficit present.  ?   Mental Status: She is alert.  ?Psychiatric:     ?   Mood and Affect: Mood normal.     ?   Behavior: Behavior normal.  ? ? ?BP 115/79   Pulse 72   Wt 148 lb 12.8 oz (67.5 kg)   LMP 09/12/2017 (LMP  Unknown)   SpO2 98%   BMI 28.12 kg/m?  ?Wt Readings from Last 3 Encounters:  ?05/28/21 148 lb 12.8 oz (67.5 kg)  ?03/20/21 146 lb 1.6 oz (66.3 kg)  ?02/27/21 153 lb 14.4 oz (69.8 kg)  ?MRI 02/2021 ?FINDINGS: ?COMBINE

## 2021-05-28 NOTE — Assessment & Plan Note (Signed)
Patient only speaks minimal English her language barrier has been an impact to healthcare access for this patient ?

## 2021-05-28 NOTE — Patient Instructions (Addendum)
Stop valsartan HCT ?Stop Allegra-D and begin cetirizine daily for allergies this was sent to the pharmacy downstairs ?Amlodipine will be changed to 10 mg take 1 daily ? ?Begin BenzaClin gel to the affected areas on the face ? ?Stop Freida Busman and keep her follow-up with Dr. Kennon Rounds I will let her know you had to stop this medication, you will likely end up needing a hysterectomy ? ?Complete set of screening labs obtained today ? ?Return to see Dr. Joya Gaskins 2 months ? ?I will have my nurse case manager call you about potential food sources that you can access for low-cost ? ? ? ?Arr?ter le valsartan HCT ?Arr?tez Allegra-D et commencez la c?tirizine quotidiennement pour les allergies cela a ?t? envoy? ? la pharmacie en bas ?L'amlodipine sera chang?e en 10 mg ? prendre 1 fois par jour ? ?Commencer le gel BenzaClin sur les zones affect?es du visage ? ?Arr?tez Freida Busman et continuez son [facility] le Dr Kennon Rounds Je lui ferai savoir que vous avez d? arr?ter ce m?dicament, vous finirez probablement par avoir besoin d'une hyst?rectomie ? ?Ensemble complet de laboratoires de d?pistage obtenu aujourd'hui ? ?I will have my nurse case manager call you about potential food sources that you can access for low-cost ? ?Retourner voir le Dr Joya Gaskins 2 mois ?

## 2021-05-28 NOTE — Assessment & Plan Note (Signed)
Patient with chronic allergic rhinitis we will prescribe cetirizine orally and ensure she not take any antihistamines over-the-counter that contain a D component ?

## 2021-05-29 LAB — CBC WITH DIFFERENTIAL/PLATELET
Basophils Absolute: 0 10*3/uL (ref 0.0–0.2)
Basos: 0 %
EOS (ABSOLUTE): 0.2 10*3/uL (ref 0.0–0.4)
Eos: 3 %
Hematocrit: 39.6 % (ref 34.0–46.6)
Hemoglobin: 13.4 g/dL (ref 11.1–15.9)
Immature Grans (Abs): 0 10*3/uL (ref 0.0–0.1)
Immature Granulocytes: 0 %
Lymphocytes Absolute: 2.4 10*3/uL (ref 0.7–3.1)
Lymphs: 26 %
MCH: 25.9 pg — ABNORMAL LOW (ref 26.6–33.0)
MCHC: 33.8 g/dL (ref 31.5–35.7)
MCV: 76 fL — ABNORMAL LOW (ref 79–97)
Monocytes Absolute: 0.8 10*3/uL (ref 0.1–0.9)
Monocytes: 9 %
Neutrophils Absolute: 5.5 10*3/uL (ref 1.4–7.0)
Neutrophils: 62 %
Platelets: 234 10*3/uL (ref 150–450)
RBC: 5.18 x10E6/uL (ref 3.77–5.28)
RDW: 14.2 % (ref 11.7–15.4)
WBC: 8.9 10*3/uL (ref 3.4–10.8)

## 2021-05-29 LAB — COMPREHENSIVE METABOLIC PANEL
ALT: 25 IU/L (ref 0–32)
AST: 25 IU/L (ref 0–40)
Albumin/Globulin Ratio: 1.8 (ref 1.2–2.2)
Albumin: 4.3 g/dL (ref 3.8–4.9)
Alkaline Phosphatase: 59 IU/L (ref 44–121)
BUN/Creatinine Ratio: 12 (ref 9–23)
BUN: 7 mg/dL (ref 6–24)
Bilirubin Total: 0.5 mg/dL (ref 0.0–1.2)
CO2: 25 mmol/L (ref 20–29)
Calcium: 9.5 mg/dL (ref 8.7–10.2)
Chloride: 99 mmol/L (ref 96–106)
Creatinine, Ser: 0.57 mg/dL (ref 0.57–1.00)
Globulin, Total: 2.4 g/dL (ref 1.5–4.5)
Glucose: 58 mg/dL — ABNORMAL LOW (ref 70–99)
Potassium: 4.1 mmol/L (ref 3.5–5.2)
Sodium: 137 mmol/L (ref 134–144)
Total Protein: 6.7 g/dL (ref 6.0–8.5)
eGFR: 109 mL/min/{1.73_m2} (ref >59)

## 2021-05-29 LAB — LIPID PANEL
Chol/HDL Ratio: 2.4 ratio (ref 0.0–4.4)
Cholesterol, Total: 164 mg/dL (ref 100–199)
HDL: 69 mg/dL (ref >39)
LDL Chol Calc (NIH): 80 mg/dL (ref 0–99)
Triglycerides: 83 mg/dL (ref 0–149)
VLDL Cholesterol Cal: 15 mg/dL (ref 5–40)

## 2021-05-29 NOTE — Telephone Encounter (Signed)
Very insightful Cindy Garrison thank you so much  I am adding the french issue to her chart ?

## 2021-06-05 ENCOUNTER — Other Ambulatory Visit: Payer: Self-pay

## 2021-06-05 ENCOUNTER — Ambulatory Visit (HOSPITAL_COMMUNITY)
Admission: EM | Admit: 2021-06-05 | Discharge: 2021-06-05 | Disposition: A | Discharge status: Home / Self Care | Payer: 59 | Attending: Family Medicine | Admitting: Family Medicine

## 2021-06-05 ENCOUNTER — Encounter (HOSPITAL_COMMUNITY): Payer: Self-pay

## 2021-06-05 DIAGNOSIS — T887XXA Unspecified adverse effect of drug or medicament, initial encounter: Secondary | ICD-10-CM | POA: Diagnosis not present

## 2021-06-05 DIAGNOSIS — R609 Edema, unspecified: Secondary | ICD-10-CM

## 2021-06-05 MED ORDER — VALSARTAN-HYDROCHLOROTHIAZIDE 80-12.5 MG PO TABS
1.0000 | ORAL_TABLET | Freq: Every day | ORAL | 0 refills | Status: DC
  Filled 2021-06-05: qty 30, 30d supply, fill #0

## 2021-06-05 NOTE — ED Provider Notes (Addendum)
[facility]    CSN: [id number] Arrival date & time: 06/05/21  1012      History   Chief Complaint Chief Complaint  Patient presents with   Foot Pain    HPI MAIZE BRITTINGHAM is a 51 y.o. female.   Patient is here for bilateral foot pain and swelling x 4 days.  Swelling is from the knees down.  She states she was given norvasc '10mg'$  this month.  She was on another medication prior but not sure why that was stopped. No sob or difficulty breathing.   She is also having itching at the toes on the left;   Past Medical History:  Diagnosis Date   #[id number]    Acne    Back pain    Essential hypertension    Seasonal allergies    Syncope 09/16/2014   Syncope and collapse 09/16/2014    Patient Active Problem List   Diagnosis Date Noted   Acne vulgaris 05/28/2021   At moderate risk for fall 05/28/2021   Food insecurity 05/28/2021   Financial insecurity 05/28/2021   Language barrier affecting health care, [place] is not well understood 05/28/2021   Hematometra 03/20/2021   Ovarian mass 11/10/2020   Cervical os stenosis 11/10/2020   History of endometrial ablation 04/09/2020   Screening mammogram for breast cancer 04/09/2020   Cervical cancer screening 01/23/2020   Pure hypercholesterolemia 01/23/2020   Colon cancer screening 01/23/2020   Vitamin D deficiency 01/23/2020   Fibroid uterus 03/26/2015   Schizoaffective disorder ([place]) 09/16/2014   Essential hypertension    Allergic rhinitis 09/13/2006    Past Surgical History:  Procedure Laterality Date   DILITATION & CURRETTAGE/HYSTROSCOPY WITH NOVASURE ABLATION N/A 12/22/2017   Procedure: DILATATION & CURETTAGE/ HYSTEROSCOPY WITH ENDOMETRIAL FIBROID AND  NOVASURE ABLATION;  Surgeon: Lavonia Drafts, MD;  Location: [facility];  Service: Gynecology;  Laterality: N/A;    OB History     Gravida  3   Para  0   Term  0   Preterm  0   AB  3   Living  0      SAB  3   IAB  0   Ectopic  0   Multiple   0   Live Births               Home Medications    Prior to Admission medications   Medication Sig Start Date End Date Taking? Authorizing Provider  acetaminophen (TYLENOL) 500 MG tablet Take 500 mg by mouth every 6 (six) hours as needed for mild pain or fever.    [provider]  amLODipine (NORVASC) 10 MG tablet Take 1 tablet (10 mg total) by mouth daily. 05/28/21 05/28/22  Elsie Stain, MD  atorvastatin (LIPITOR) 20 MG tablet Take 1 tablet (20 mg total) by mouth daily. 05/28/21 05/28/22  Elsie Stain, MD  cetirizine (ZYRTEC) 10 MG tablet Take 1 tablet (10 mg total) by mouth daily. 05/28/21   Elsie Stain, MD  clindamycin-benzoyl peroxide [facility]) gel Apply topically 2 (two) times daily. 05/28/21   Elsie Stain, MD  haloperidol (HALDOL) 1 MG tablet Take 3 tablets (3 mg total) by mouth daily. 05/07/21   Penn, Lunette Stands, NP  traZODone (DESYREL) 50 MG tablet Take 0.5-1 tablets (25-50 mg total) by mouth at bedtime. 05/07/21   Penn, Lunette Stands, NP  Vitamin D, Ergocalciferol, (DRISDOL) 1.25 MG (50000 UNIT) CAPS capsule Take 1 capsule (50,000 Units total) by mouth every 7 (seven) days on  Wednesday 05/28/21 05/28/22  Elsie Stain, MD  hydrochlorothiazide (HYDRODIURIL) 25 MG tablet Take 1 tablet (25 mg total) by mouth daily. 09/20/19 01/23/20  Charlott Rakes, MD  lisinopril (ZESTRIL) 40 MG tablet TAKE 1 TABLET (40 MG TOTAL) BY MOUTH DAILY. TO LOWER BLOOD PRESSURE 01/08/20 01/23/20  Charlott Rakes, MD  omeprazole (PRILOSEC) 40 MG capsule Take 1 capsule (40 mg total) by mouth 2 (two) times daily before a meal. 12/28/18 12/29/18  Antony Blackbird, MD    Family History Family History  Problem Relation Age of Onset   Breast cancer Sister     Social History Social History   Tobacco Use   Smoking status: Never   Smokeless tobacco: Never  Vaping Use   Vaping Use: Never used  Substance Use Topics   Alcohol use: Not Currently    Comment: occ.   Drug use: No     Allergies    Other   Review of Systems Review of Systems  Constitutional: Negative.   HENT: Negative.    Respiratory: Negative.    Cardiovascular:  Positive for leg swelling.  Gastrointestinal: Negative.   Genitourinary: Negative.   Musculoskeletal: Negative.   Psychiatric/Behavioral: Negative.      Physical Exam Triage Vital Signs ED Triage Vitals [06/05/21 1202]  Enc Vitals Group     BP (!) 147/79     Pulse Rate 84     Resp 16     Temp 98.5 F (36.9 C)     Temp Source Oral     SpO2 97 %     Weight      Height      Head Circumference      Peak Flow      Pain Score      Pain Loc      Pain Edu?      Excl. in [place]?    No data found.  Updated Vital Signs BP (!) 147/79 (BP Location: Left Arm)   Pulse 84   Temp 98.5 F (36.9 C) (Oral)   Resp 16   LMP 09/12/2017 (LMP Unknown)   SpO2 97%   Visual Acuity Right Eye Distance:   Left Eye Distance:   Bilateral Distance:    Right Eye Near:   Left Eye Near:    Bilateral Near:     Physical Exam Constitutional:      Appearance: Normal appearance.  Cardiovascular:     Rate and Rhythm: Normal rate.     Comments: + edema from the knees down bilaterally;  nonpitting;  no rash or erythema noted;  tender at the feet/ankles bilaterally Pulmonary:     Effort: Pulmonary effort is normal.     Breath sounds: Normal breath sounds.  Abdominal:     Palpations: Abdomen is soft.  Musculoskeletal:     Cervical back: Normal range of motion.  Skin:    General: Skin is warm and dry.     Findings: No rash.  Neurological:     Mental Status: She is alert.     UC Treatments / Results  Labs (all labs ordered are listed, but only abnormal results are displayed) Labs Reviewed - No data to display  EKG   Radiology No results found.  Procedures Procedures (including critical care time)  Medications Ordered in UC Medications - No data to display  Initial Impression / Assessment and Plan / UC Course  I have reviewed the triage vital  signs and the nursing notes.  Pertinent labs & imaging results that  were available during my care of the patient were reviewed by me and considered in my medical decision making (see chart for details).  Patient seen for swelling of LE, likely due to norvasc.  Not entirely sure why this was changed by pcp.  Will restart diovan at a lower dose.  Advised patient to follow up with pcp for monitoring of blood pressure.    Final Clinical Impressions(s) / UC Diagnoses   Final diagnoses:  Edema, unspecified type  Medication side effect     Discharge Instructions      On vous a vu aujourd'hui pour des douleurs et un gonflement des [place]. Cela est d  vos mdicaments contre l'hypertension. Kandra Nicolas l'amlodipine car c'est la cause. J'ai envoy un nouveau mdicament contre l'hypertension  prendre Research scientist (physical sciences). Vous pouvez prendre du tylenol uniquement pour [employer]. Veuillez garder vos jambes surleves autant que possible car cela vous aidera. Je vous recommande de faire un suivi auprs de votre fournisseur de soins primaires pour le suivi de votre tension artrielle avec ce nouveau mdicament.  You were seen today for pain and swelling in your legs.  This is due to your blood pressure medication.  Please STOP the amlodipine as this is the cause.  I have sent out a new blood pressure medication to take in the evening before work.  You may take tylenol for pain only.  Please keep your legs elevated as much a possible as this will help.   I recommend you follow up with your primary care provider for follow up for your blood pressure with this new medication.     ED Prescriptions     Medication Sig Dispense Auth. Provider   valsartan-hydrochlorothiazide (DIOVAN HCT) 80-12.5 MG tablet Take 1 tablet by mouth daily. 30 tablet Rondel Oh, MD      PDMP not reviewed this encounter.   Rondel Oh, MD 06/05/21 1243    Rondel Oh, MD 06/05/21 1244

## 2021-06-05 NOTE — ED Triage Notes (Signed)
Pt presents with bilateral foot pain and swelling. No known injury to her feet.

## 2021-06-05 NOTE — Discharge Instructions (Addendum)
On vous a vu aujourd'hui pour des douleurs et un gonflement des [place]. Cela est d  vos mdicaments contre l'hypertension. Kandra Nicolas l'amlodipine car c'est la cause. J'ai envoy un nouveau mdicament contre l'hypertension  prendre Research scientist (physical sciences). Vous pouvez prendre du tylenol uniquement pour [employer]. Veuillez garder vos jambes surleves autant que possible car cela vous aidera. Je vous recommande de faire un suivi auprs de votre fournisseur de soins primaires pour le suivi de votre tension artrielle avec ce nouveau mdicament.  You were seen today for pain and swelling in your legs.  This is due to your blood pressure medication.  Please STOP the amlodipine as this is the cause.  I have sent out a new blood pressure medication to take in the evening before work.  You may take tylenol for pain only.  Please keep your legs elevated as much a possible as this will help.   I recommend you follow up with your primary care provider for follow up for your blood pressure with this new medication.

## 2021-06-08 ENCOUNTER — Other Ambulatory Visit: Payer: Self-pay | Admitting: Critical Care Medicine

## 2021-06-08 ENCOUNTER — Other Ambulatory Visit: Payer: Self-pay

## 2021-06-08 DIAGNOSIS — L2089 Other atopic dermatitis: Secondary | ICD-10-CM | POA: Diagnosis not present

## 2021-06-09 ENCOUNTER — Other Ambulatory Visit: Payer: Self-pay

## 2021-06-09 MED ORDER — CLINDAMYCIN PHOS-BENZOYL PEROX 1-5 % EX GEL
Freq: Two times a day (BID) | CUTANEOUS | 0 refills | Status: DC
  Filled 2021-06-09: qty 25, fill #0

## 2021-06-09 NOTE — Telephone Encounter (Signed)
Requested Prescriptions  Pending Prescriptions Disp Refills  . clindamycin-benzoyl peroxide (BENZACLIN) gel 25 g 0    Sig: Apply topically 2 (two) times daily.     Dermatology:  Acne preparations Passed - 06/08/2021 10:57 AM      Passed - Valid encounter within last 12 months    Recent Outpatient Visits          1 week ago Essential hypertension   [place], MD   4 months ago Essential hypertension   [place], MD   6 months ago Pure hypercholesterolemia   [place], MD   11 months ago Essential hypertension   [place], Patrick E, MD   1 year ago Essential hypertension   [place], MD      Future Appointments            In 1 month Joya Gaskins Burnett Harry, MD [place]

## 2021-06-18 ENCOUNTER — Other Ambulatory Visit: Payer: Self-pay

## 2021-06-18 ENCOUNTER — Ambulatory Visit (INDEPENDENT_AMBULATORY_CARE_PROVIDER_SITE_OTHER): Payer: 59 | Admitting: Family Medicine

## 2021-06-18 ENCOUNTER — Encounter: Payer: Self-pay | Admitting: Family Medicine

## 2021-06-18 VITALS — BP 127/84 | HR 85 | Wt 157.0 lb

## 2021-06-18 DIAGNOSIS — N857 Hematometra: Secondary | ICD-10-CM

## 2021-06-18 MED ORDER — NORETHINDRONE ACETATE 5 MG PO TABS
5.0000 mg | ORAL_TABLET | Freq: Every day | ORAL | 2 refills | Status: DC
  Filled 2021-06-18: qty 30, 30d supply, fill #0

## 2021-06-18 NOTE — Assessment & Plan Note (Signed)
Begin Aygestin in hopes of keeping her uterus quiet. Consider Depo-Lupron if needed. May consider LEEP to drain uterus if not getting smaller or significant mass effect--should improve with menopause.

## 2021-06-18 NOTE — Progress Notes (Signed)
   Subjective:    Patient ID: Cindy Garrison is a 51 y.o. female presenting with Follow-up  on 06/18/2021  HPI: Feels no pain right now. Took 3 weeks of [place] before having an allergic reaction. Would still prefer to avoid surgery. Having no pain or bleeding. MRI confirms hematometra.  Review of Systems  Constitutional:  Negative for chills and fever.  Respiratory:  Negative for shortness of breath.   Cardiovascular:  Negative for chest pain.  Gastrointestinal:  Negative for abdominal pain, nausea and vomiting.  Genitourinary:  Negative for dysuria.  Skin:  Negative for rash.     Objective:    BP 127/84   Pulse 85   Wt 157 lb (71.2 kg)   LMP 09/12/2017 (LMP Unknown)   BMI 29.66 kg/m  Physical Exam Exam conducted with a chaperone present.  Constitutional:      General: She is not in acute distress.    Appearance: She is well-developed.  HENT:     Head: Normocephalic and atraumatic.  Eyes:     General: No scleral icterus. Cardiovascular:     Rate and Rhythm: Normal rate.  Pulmonary:     Effort: Pulmonary effort is normal.  Abdominal:     Palpations: Abdomen is soft.  Musculoskeletal:     Cervical back: Neck supple.  Skin:    General: Skin is warm and dry.  Neurological:     Mental Status: She is alert and oriented to person, place, and time.   1. Hematometra with marked distension of the endometrial cavity and enlargement of the uterus, suggesting obstruction at the level of the cervix. There is an area of heterogeneity in the cervix which is indeterminate and not well evaluated due to motion/lack of contrast, recommend direct visualization/gynecologic evaluation. 2. Ovaries are not well evaluated. There is evidence of bilateral hematosalpinx and likely simple and hemorrhagic ovarian cysts, left greater than right. 3. No ascites or lymphadenopathy identified. 4. Uterine fibroids. 5. Mild right hydronephrosis similar to previous study, possibly secondary to  uterine mass effect on the distal ureter.       Assessment & Plan:   Problem List Items Addressed This Visit       Unprioritized   Hematometra - Primary    Begin Aygestin in hopes of keeping her uterus quiet. Consider Depo-Lupron if needed. May consider LEEP to drain uterus if not getting smaller or significant mass effect--should improve with menopause.       Relevant Medications   norethindrone (AYGESTIN) 5 MG tablet     Return in about 3 months (around 09/18/2021) for a follow-up.  Donnamae Jude, MD 06/18/2021 3:35 PM

## 2021-06-22 ENCOUNTER — Other Ambulatory Visit: Payer: Self-pay

## 2021-07-03 ENCOUNTER — Other Ambulatory Visit: Payer: Self-pay | Admitting: Critical Care Medicine

## 2021-07-03 ENCOUNTER — Other Ambulatory Visit: Payer: Self-pay

## 2021-07-03 MED ORDER — VALSARTAN-HYDROCHLOROTHIAZIDE 80-12.5 MG PO TABS
1.0000 | ORAL_TABLET | Freq: Every day | ORAL | 2 refills | Status: DC
  Filled 2021-07-03: qty 30, 30d supply, fill #0
  Filled 2021-07-30: qty 30, 30d supply, fill #1
  Filled 2021-09-01: qty 30, 30d supply, fill #2

## 2021-07-06 ENCOUNTER — Other Ambulatory Visit: Payer: Self-pay

## 2021-07-30 ENCOUNTER — Other Ambulatory Visit: Payer: Self-pay

## 2021-07-30 ENCOUNTER — Telehealth (HOSPITAL_COMMUNITY): Payer: 59 | Admitting: Psychiatry

## 2021-07-30 ENCOUNTER — Encounter (HOSPITAL_COMMUNITY): Payer: Self-pay

## 2021-08-04 ENCOUNTER — Ambulatory Visit: Payer: 59 | Attending: Critical Care Medicine | Admitting: Critical Care Medicine

## 2021-08-04 ENCOUNTER — Encounter: Payer: Self-pay | Admitting: Critical Care Medicine

## 2021-08-04 ENCOUNTER — Other Ambulatory Visit: Payer: Self-pay

## 2021-08-04 VITALS — BP 134/70 | HR 64 | Wt 152.6 lb

## 2021-08-04 DIAGNOSIS — K089 Disorder of teeth and supporting structures, unspecified: Secondary | ICD-10-CM | POA: Diagnosis not present

## 2021-08-04 DIAGNOSIS — N857 Hematometra: Secondary | ICD-10-CM

## 2021-08-04 DIAGNOSIS — L700 Acne vulgaris: Secondary | ICD-10-CM | POA: Diagnosis not present

## 2021-08-04 DIAGNOSIS — I10 Essential (primary) hypertension: Secondary | ICD-10-CM | POA: Diagnosis not present

## 2021-08-04 DIAGNOSIS — G8929 Other chronic pain: Secondary | ICD-10-CM | POA: Diagnosis not present

## 2021-08-04 DIAGNOSIS — J301 Allergic rhinitis due to pollen: Secondary | ICD-10-CM | POA: Diagnosis not present

## 2021-08-04 MED ORDER — CETIRIZINE HCL 10 MG PO TABS
10.0000 mg | ORAL_TABLET | Freq: Every day | ORAL | 11 refills | Status: DC
  Filled 2021-08-04: qty 30, 30d supply, fill #0

## 2021-08-04 NOTE — Assessment & Plan Note (Signed)
Improved with topical BenzaClin

## 2021-08-04 NOTE — Assessment & Plan Note (Signed)
At goal on valsartan HCT alone will not refill amlodipine

## 2021-08-04 NOTE — Assessment & Plan Note (Signed)
Never picked up the cetirizine we will reorder medication

## 2021-08-04 NOTE — Progress Notes (Signed)
Established Patient Office Visit  Subjective:  Patient ID: Cindy Garrison, female    DOB: 1970-03-25  Age: 51 y.o. MRN: [id number]  CC:  Chief Complaint  Patient presents with   Hypertension    HPI 01/2021 Cindy Garrison presents for follow up after ED visit and for hypertension follow up. [place] interpretation services were used, with [place], [phone].   The patient visited the ED for the ongoing issue of lower abdominal pain. This is attributed to the uterine leiomyoma She states that the pain has actually improved recently. The pain is bilateral and she notices it on her left and right sides. She admits some vaginal pain that she has noticed when wiping after urinating. She denies any vaginal discharge or bleeding. An MRI was ordered by gynecology (Dr. Elgie Congo) to assess the uterine leiomyoma, with plans for surgery. The patient is unsure of what the MRI is and has not been able to have the imaging done yet. She would appreciate assistance with scheduling this scan. It is vital that this scan be completed.  Patient has diagnosis of schizoaffective disorder, for which she takes haloperidol (Haldol). She has established care with behavioral health, Eulis Canner, NP.  Since her last visit here, she has been able to get a screening mammogram which was negative. She does need a colon cancer screening.   The patient's blood pressure is at therapeutic goal today. She has been confused regarding the dosage of the Valsartan-HCTZ, but should now be taking the higher dose as prescribed last time. She has reported some dizziness since last visit, but denies any palpitations. She has no marked edema.   05/28/2021 Patient seen in return follow-up and this visit was assisted by video interpreter Mona for [place] [phone] The patient has been seen by gynecology's on several occasions since I last saw her in January.  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  This is the cause of her pelvic  pain.  She was begun on [place] to see if this would cause decreased uterine bleeding and facilitate absorption of the hematometra The patient took about a pack and a half of the medication given to her and has not been able to complete she had excess sweating and increased acne on the face and the gluteal area.  She also had other side effects she did not like.  Patient has return visit June 1 with Dr. Kennon Rounds to discuss further options. Patient also has fallen twice at work and on arrival blood pressure is 115/79.  We had recently increased her blood pressure medicine to add valsartan HCT along with amlodipine and I think this may be too much medication for this patient but tickly with the ongoing issues in the uterus.  Patient also has increased allergies and picked up a box of Allegra-D I indicated to her that she should not be taking any allergy medicines with the components.  We can get her prescription version of Zyrtec.  There are no other complaints.  MRI done in February has been reviewed and documented as below  Patient complains of allergic rhinitis worse with the increased pollen were currently experiencing she picked up an [place] from the pharmacy and I told her to stop this because of the D component  7/18 Patient seen in return follow-up this visit was assisted by [place] interpreter Stann Mainland [phone] Patient is in good spirits on arrival states her bleeding has significantly improved as has her pelvic pain on the current dose of norethindrone  On arrival blood pressure is good 134/70.  Patient is having continued allergies she did not get her cetirizine filled.  She also complains of bilateral upper and lower tooth pain.  There are no other complaints.  Patient states her dizziness and frequent falls have stopped  Past Medical History:  Diagnosis Date   #[id number]    Acne    Back pain    Essential hypertension    Seasonal allergies    Syncope 09/16/2014   Syncope and collapse  09/16/2014    Past Surgical History:  Procedure Laterality Date   DILITATION & CURRETTAGE/HYSTROSCOPY WITH NOVASURE ABLATION N/A 12/22/2017   Procedure: DILATATION & CURETTAGE/ HYSTEROSCOPY WITH ENDOMETRIAL FIBROID AND  NOVASURE ABLATION;  Surgeon: Lavonia Drafts, MD;  Location: [facility];  Service: Gynecology;  Laterality: N/A;    Family History  Problem Relation Age of Onset   Breast cancer Sister     Social History   Socioeconomic History   Marital status: Married    Spouse name: Not on file   Number of children: Not on file   Years of education: Not on file   Highest education level: Not on file  Occupational History   Not on file  Tobacco Use   Smoking status: Never   Smokeless tobacco: Never  Vaping Use   Vaping Use: Never used  Substance and Sexual Activity   Alcohol use: Not Currently    Comment: occ.   Drug use: No   Sexual activity: Not on file    Comment: monogamous female partner for 5 years (2010)  Other Topics Concern   Not on file  Social History Narrative   Not on file   Social Determinants of Health   Financial Resource Strain: High Risk (05/28/2021)   Overall Financial Resource Strain (CARDIA)    Difficulty of Paying Living Expenses: Very hard  Food Insecurity: Food Insecurity Present (05/28/2021)   Hunger Vital Sign    Worried About Running Out of Food in the Last Year: Often true    Ran Out of Food in the Last Year: Often true  Transportation Needs: No Transportation Needs (03/20/2021)   PRAPARE - Hydrologist (Medical): No    Lack of Transportation (Non-Medical): No  Physical Activity: Not on file  Stress: Not on file  Social Connections: Not on file  Intimate Partner Violence: Not on file    Outpatient Medications Prior to Visit  Medication Sig Dispense Refill   acetaminophen (TYLENOL) 500 MG tablet Take 500 mg by mouth every 6 (six) hours as needed for mild pain or fever.     atorvastatin (LIPITOR) 20 MG  tablet Take 1 tablet (20 mg total) by mouth daily. 90 tablet 2   norethindrone (AYGESTIN) 5 MG tablet Take 1 tablet (5 mg total) by mouth daily. 90 tablet 2   triamcinolone ointment (KENALOG) 0.1 % SMARTSIG:1 Topical Twice Daily PRN     valsartan-hydrochlorothiazide (DIOVAN HCT) 80-12.5 MG tablet Take 1 tablet by mouth daily. 30 tablet 2   Vitamin D, Ergocalciferol, (DRISDOL) 1.25 MG (50000 UNIT) CAPS capsule Take 1 capsule (50,000 Units total) by mouth every 7 (seven) days on Wednesday 16 capsule 4   amLODipine (NORVASC) 10 MG tablet Take 10 mg by mouth daily. (Patient not taking: Reported on 06/18/2021)     cetirizine (ZYRTEC) 10 MG tablet Take 1 tablet (10 mg total) by mouth daily. (Patient not taking: Reported on 06/18/2021) 30 tablet 11   clindamycin-benzoyl peroxide (BENZACLIN) gel Apply  topically 2 (two) times daily. (Patient not taking: Reported on 06/18/2021) 25 g 0   haloperidol (HALDOL) 1 MG tablet Take 3 tablets (3 mg total) by mouth once daily. 90 tablet 2   traMADol (ULTRAM) 50 MG tablet Take 50 mg by mouth as needed.     traZODone (DESYREL) 50 MG tablet Take 0.5-1 tablets (25-50 mg total) by mouth at bedtime. (Patient not taking: Reported on 06/18/2021) 30 tablet 2   No facility-administered medications prior to visit.    Allergies  Allergen Reactions   Other Rash    Blood pressure pill (unknown name)    ROS Review of Systems  Constitutional:  Negative for fatigue.  HENT:  Positive for postnasal drip and rhinorrhea.   Eyes:  Negative for visual disturbance.  Respiratory: Negative.    Cardiovascular: Negative.  Negative for palpitations.  Gastrointestinal: Negative.   Genitourinary:  Negative for dysuria, pelvic pain, vaginal bleeding and vaginal discharge.  Skin:  Negative for rash.       Acne has resolved  Neurological:  Negative for dizziness.  Psychiatric/Behavioral:  Negative for sleep disturbance.       Objective:    Physical Exam Constitutional:      Appearance:  Normal appearance.  HENT:     Head: Normocephalic and atraumatic.     Mouth/Throat:     Mouth: Mucous membranes are moist.     Dentition: Normal dentition (dentition fair).     Pharynx: Oropharynx is clear. Uvula midline.  Cardiovascular:     Rate and Rhythm: Normal rate and regular rhythm.     Heart sounds: Normal heart sounds. No murmur heard.    No friction rub. No gallop.  Pulmonary:     Effort: Pulmonary effort is normal.     Breath sounds: Normal breath sounds. No wheezing, rhonchi or rales.  Abdominal:     General: There is no distension.     Tenderness: There is no abdominal tenderness.  Skin:    General: Skin is warm and dry.     Findings: No rash.     Comments: Acneiform rash in both cheeks and lateral to both eyes in the temples has markedly improved  Neurological:     General: No focal deficit present.     Mental Status: She is alert.  Psychiatric:        Mood and Affect: Mood normal.        Behavior: Behavior normal.     BP 134/70   Pulse 64   Wt 152 lb 9.6 oz (69.2 kg)   LMP 09/12/2017 (LMP Unknown)   SpO2 94%   BMI 28.83 kg/m  Wt Readings from Last 3 Encounters:  08/04/21 152 lb 9.6 oz (69.2 kg)  06/18/21 157 lb (71.2 kg)  05/28/21 148 lb 12.8 oz (67.5 kg)  MRI 02/2021 FINDINGS: COMBINED FINDINGS FOR BOTH MR ABDOMEN AND PELVIS   Study is significantly limited due to motion, uncooperative patient and lack of contrast.   Lower chest: Not visualized.   Hepatobiliary: Liver appears normal in size and contour with no suspicious mass identified. Gallbladder within normal limits. No biliary ductal dilatation identified.   Pancreas: Grossly normal with no mass or ductal dilatation visualized.   Spleen:  Within normal limits in size and appearance.   Adrenals/Urinary Tract: Adrenal glands appear within normal limits. Mild right hydronephrosis similar to previous CT. No definite suspicious renal mass identified.   Stomach/Bowel: No evidence of bowel  obstruction.   Vascular/Lymphatic: No pathologically enlarged lymph  nodes identified. No abdominal aortic aneurysm demonstrated.   Reproductive: Uterus measures approximately 11.3 x 16.3 x 24 cm in AP, transverse and craniocaudal dimensions. Several hypointense T2 signal likely uterine fibroids identified measuring up to 4.6 x 2.4 cm on the left. The endometrium is markedly distended with homogeneous hyperintense T1 and hyperintense T2 signal material, likely blood products, measuring approximally 10.6 x 13.7 x 22 cm in AP, transverse and craniocaudal dimensions. Indeterminate heterogeneity of the cervix measuring approximately 3 x 2.4 cm best seen on the sagittal T2 sequence. Vaginal canal appears within normal limits.   Ovaries are not well visualized. Lobulated multicystic appearance of the bilateral adnexa/ovaries measuring up to 5.3 x 3.8 cm in axial dimensions on the left and 4.7 x 3.7 cm on the right. Associated tubular/cystic components demonstrate hyperintense T1 and T2 signal.   Other:  No ascites visualized.   Musculoskeletal: No suspicious bony lesions identified.   IMPRESSION: 1. Hematometra with marked distension of the endometrial cavity and enlargement of the uterus, suggesting obstruction at the level of the cervix. There is an area of heterogeneity in the cervix which is indeterminate and not well evaluated due to motion/lack of contrast, recommend direct visualization/gynecologic evaluation. 2. Ovaries are not well evaluated. There is evidence of bilateral hematosalpinx and likely simple and hemorrhagic ovarian cysts, left greater than right. 3. No ascites or lymphadenopathy identified. 4. Uterine fibroids. 5. Mild right hydronephrosis similar to previous study, possibly secondary to uterine mass effect on the distal ureter  There are no preventive care reminders to display for this patient.   There are no preventive care reminders to display for this  patient.  Lab Results  Component Value Date   TSH 1.260 06/20/2019   Lab Results  Component Value Date   WBC 8.9 05/28/2021   HGB 13.4 05/28/2021   HCT 39.6 05/28/2021   MCV 76 (L) 05/28/2021   PLT 234 05/28/2021   Lab Results  Component Value Date   NA 137 05/28/2021   K 4.1 05/28/2021   CO2 25 05/28/2021   GLUCOSE 58 (L) 05/28/2021   BUN 7 05/28/2021   CREATININE 0.57 05/28/2021   BILITOT 0.5 05/28/2021   ALKPHOS 59 05/28/2021   AST 25 05/28/2021   ALT 25 05/28/2021   PROT 6.7 05/28/2021   ALBUMIN 4.3 05/28/2021   CALCIUM 9.5 05/28/2021   ANIONGAP 13 01/01/2021   EGFR 109 05/28/2021   Lab Results  Component Value Date   CHOL 164 05/28/2021   Lab Results  Component Value Date   HDL 69 05/28/2021   Lab Results  Component Value Date   LDLCALC 80 05/28/2021   Lab Results  Component Value Date   TRIG 83 05/28/2021   Lab Results  Component Value Date   CHOLHDL 2.4 05/28/2021   Lab Results  Component Value Date   HGBA1C 5.5 01/24/2018      Assessment & Plan:   Problem List Items Addressed This Visit       Cardiovascular and Mediastinum   Essential hypertension    At goal on valsartan HCT alone will not refill amlodipine        Respiratory   Allergic rhinitis    Never picked up the cetirizine we will reorder medication        Musculoskeletal and Integument   Acne vulgaris    Improved with topical BenzaClin        Genitourinary   Hematometra - Primary    Markedly improved on norethindrone we will  continue same per gynecology        Other   Chronic dental pain    Referral to dentistry was made      Relevant Orders   Ambulatory referral to Dentistry   Meds ordered this encounter  Medications   cetirizine (ZYRTEC) 10 MG tablet    Sig: Take 1 tablet (10 mg total) by mouth daily.    Dispense:  30 tablet    Refill:  11  40 minutes spent assessing patient high risk for hospitalization history of falls issues with uterus skin  conditions medication side effects food access other social determinants of health barriers  Follow-up: No follow-ups on file.    Asencion Noble, MD

## 2021-08-04 NOTE — Patient Instructions (Signed)
Cetirizine to be taken daily for allergies was sent to our pharmacy  No other medication changes  Referral to dentistry was made  Return to Dr. Joya Gaskins 3 months  La ctirizine  prendre quotidiennement pour les allergies a t envoye  notre pharmacie  Aucun autre changement de mdicament  L'orientation vers la dentisterie a t Systems developer au Dr Joya Gaskins 3 mois

## 2021-08-04 NOTE — Assessment & Plan Note (Signed)
Referral to dentistry was made

## 2021-08-04 NOTE — Assessment & Plan Note (Signed)
Markedly improved on norethindrone we will continue same per gynecology

## 2021-09-01 ENCOUNTER — Other Ambulatory Visit: Payer: Self-pay

## 2021-09-07 ENCOUNTER — Encounter: Payer: Self-pay | Admitting: Family Medicine

## 2021-09-07 ENCOUNTER — Ambulatory Visit: Payer: 59 | Admitting: Family Medicine

## 2021-09-07 NOTE — Progress Notes (Signed)
Patient did not keep appointment today. She may call to reschedule.  

## 2021-10-01 ENCOUNTER — Telehealth: Payer: Self-pay | Admitting: Emergency Medicine

## 2021-10-01 ENCOUNTER — Other Ambulatory Visit: Payer: Self-pay

## 2021-10-01 ENCOUNTER — Other Ambulatory Visit: Payer: Self-pay | Admitting: Critical Care Medicine

## 2021-10-01 MED ORDER — VALSARTAN-HYDROCHLOROTHIAZIDE 80-12.5 MG PO TABS
1.0000 | ORAL_TABLET | Freq: Every day | ORAL | 2 refills | Status: DC
  Filled 2021-10-01: qty 30, 30d supply, fill #0
  Filled 2021-10-28: qty 30, 30d supply, fill #1
  Filled 2021-12-04: qty 30, 30d supply, fill #2

## 2021-10-01 NOTE — Telephone Encounter (Signed)
Patient presents to office wanting a referral to an eye doctor for eyeglasses.

## 2021-10-02 NOTE — Telephone Encounter (Signed)
No referral is needed. Can go to any [facility]

## 2021-10-07 ENCOUNTER — Encounter (HOSPITAL_COMMUNITY): Payer: Self-pay | Admitting: Emergency Medicine

## 2021-10-07 ENCOUNTER — Other Ambulatory Visit: Payer: Self-pay

## 2021-10-07 ENCOUNTER — Ambulatory Visit (HOSPITAL_COMMUNITY)
Admission: EM | Admit: 2021-10-07 | Discharge: 2021-10-07 | Disposition: A | Discharge status: Home / Self Care | Payer: 59 | Attending: Sports Medicine | Admitting: Sports Medicine

## 2021-10-07 DIAGNOSIS — H60393 Other infective otitis externa, bilateral: Secondary | ICD-10-CM

## 2021-10-07 DIAGNOSIS — B353 Tinea pedis: Secondary | ICD-10-CM

## 2021-10-07 MED ORDER — NEOMYCIN-POLYMYXIN-HC 3.5-10000-1 OT SUSP
4.0000 [drp] | Freq: Three times a day (TID) | OTIC | 0 refills | Status: DC
  Filled 2021-10-07: qty 10, 8d supply, fill #0

## 2021-10-07 MED ORDER — CLOTRIMAZOLE 1 % EX CREA
1.0000 | TOPICAL_CREAM | Freq: Two times a day (BID) | CUTANEOUS | 0 refills | Status: DC
  Filled 2021-10-07: qty 30, 15d supply, fill #0

## 2021-10-07 NOTE — Telephone Encounter (Signed)
Called and left voicemail for patient   Interpreter [phone]

## 2021-10-07 NOTE — ED Provider Notes (Signed)
[facility]   [id number] 10/07/21 Arrival Time: 4496  ASSESSMENT & PLAN:  1. Infective otitis externa of both ears   2. Tinea pedis of both feet   -History and exam consistent with otitis externa.  Will treat with Cortisporin drops.  -History exam consistent with tinea pedis.  Will treat with clotrimazole cream.  All questions answered with the help of interpreter and she voiced understanding and agreed to plan.  Meds ordered this encounter  Medications   neomycin-polymyxin-hydrocortisone (CORTISPORIN) 3.5-10000-1 OTIC suspension    Sig: Place 4 drops into both ears 3 (three) times daily.    Dispense:  10 mL    Refill:  0   clotrimazole (LOTRIMIN) 1 % cream    Sig: Apply to affected area 2 times daily    Dispense:  30 g    Refill:  0   Discharge Instructions   None     Follow-up Information     Elsie Stain, MD.   Specialty: Pulmonary Disease Contact information: [address] [place] [address] [phone]                  Reviewed expectations re: course of current medical issues. Questions answered. Outlined signs and symptoms indicating need for more acute intervention. Patient verbalized understanding. After Visit Summary given.   SUBJECTIVE: Encounter was performed with the help of a [place] interpreter.  Pleasant 51 year old female here for evaluation of ears and feet.  For 3 weeks she has had bilateral ear discharge.  Clear/yellow fluid coming from both ears.  The ears used to hurt but now feeling a little better.  She has not taken any medications for this.  Denies any fevers, sore throat, cough.  In regards to her feet, she has also had itching between the toes for 3 weeks.  She has tried some Vicks rub on the feet but has not made any better.  Patient's last menstrual period was 09/12/2017 (lmp unknown). Past Surgical History:  Procedure Laterality Date   DILITATION & CURRETTAGE/HYSTROSCOPY WITH NOVASURE ABLATION  N/A 12/22/2017   Procedure: DILATATION & CURETTAGE/ HYSTEROSCOPY WITH ENDOMETRIAL FIBROID AND  NOVASURE ABLATION;  Surgeon: Lavonia Drafts, MD;  Location: [facility];  Service: Gynecology;  Laterality: N/A;     OBJECTIVE:  Vitals:   10/07/21 1004 10/07/21 1027  BP:  (!) 143/85  Pulse: 83   Resp: 18   Temp: 98.2 F (36.8 C)   TempSrc: Oral   SpO2: 99%      Physical Exam Vitals reviewed.  Constitutional:      Appearance: Normal appearance. She is not ill-appearing.  HENT:     Head: Normocephalic.     Right Ear: Tympanic membrane normal. Drainage present.     Left Ear: Tympanic membrane normal. Drainage present.     Ears:     Comments: Crusting in the canals and on the outside of the ears bilaterally. Eyes:     Extraocular Movements: Extraocular movements intact.  Cardiovascular:     Rate and Rhythm: Normal rate.  Pulmonary:     Effort: Pulmonary effort is normal.  Musculoskeletal:        General: Normal range of motion.     Cervical back: Normal range of motion. No tenderness.  Skin:    Comments: White crusting between the second through fifth toes bilaterally      Labs: Results for orders placed or performed in visit on 05/28/21  Comprehensive metabolic panel  Result Value Ref Range  Glucose 58 (L) 70 - 99 mg/dL   BUN 7 6 - 24 mg/dL   Creatinine, Ser 1.46 0.57 - 1.00 mg/dL   eGFR 274 >02 LZ/IHU/4.58   BUN/Creatinine Ratio 12 9 - 23   Sodium 137 134 - 144 mmol/L   Potassium 4.1 3.5 - 5.2 mmol/L   Chloride 99 96 - 106 mmol/L   CO2 25 20 - 29 mmol/L   Calcium 9.5 8.7 - 10.2 mg/dL   Total Protein 6.7 6.0 - 8.5 g/dL   Albumin 4.3 3.8 - 4.9 g/dL   Globulin, Total 2.4 1.5 - 4.5 g/dL   Albumin/Globulin Ratio 1.8 1.2 - 2.2   Bilirubin Total 0.5 0.0 - 1.2 mg/dL   Alkaline Phosphatase 59 44 - 121 IU/L   AST 25 0 - 40 IU/L   ALT 25 0 - 32 IU/L  CBC with Differential/Platelet  Result Value Ref Range   WBC 8.9 3.4 - 10.8 x10E3/uL   RBC 5.18 3.77 - 5.28  x10E6/uL   Hemoglobin 13.4 11.1 - 15.9 g/dL   Hematocrit 00.4 50.0 - 46.6 %   MCV 76 (L) 79 - 97 fL   MCH 25.9 (L) 26.6 - 33.0 pg   MCHC 33.8 31.5 - 35.7 g/dL   RDW 40.6 21.9 - 79.1 %   Platelets 234 150 - 450 x10E3/uL   Neutrophils 62 Not Estab. %   Lymphs 26 Not Estab. %   Monocytes 9 Not Estab. %   Eos 3 Not Estab. %   Basos 0 Not Estab. %   Neutrophils Absolute 5.5 1.4 - 7.0 x10E3/uL   Lymphocytes Absolute 2.4 0.7 - 3.1 x10E3/uL   Monocytes Absolute 0.8 0.1 - 0.9 x10E3/uL   EOS (ABSOLUTE) 0.2 0.0 - 0.4 x10E3/uL   Basophils Absolute 0.0 0.0 - 0.2 x10E3/uL   Immature Granulocytes 0 Not Estab. %   Immature Grans (Abs) 0.0 0.0 - 0.1 x10E3/uL  Lipid panel  Result Value Ref Range   Cholesterol, Total 164 100 - 199 mg/dL   Triglycerides 83 0 - 149 mg/dL   HDL 69 >41 mg/dL   VLDL Cholesterol Cal 15 5 - 40 mg/dL   LDL Chol Calc (NIH) 80 0 - 99 mg/dL   Chol/HDL Ratio 2.4 0.0 - 4.4 ratio   Labs Reviewed - No data to display  Imaging: No results found.   Allergies  Allergen Reactions   Other Rash    Blood pressure pill (unknown name)                                               Past Medical History:  Diagnosis Date   #[id number]    Acne    Back pain    Essential hypertension    Seasonal allergies    Syncope 09/16/2014   Syncope and collapse 09/16/2014    Social History   Socioeconomic History   Marital status: Married    Spouse name: Not on file   Number of children: Not on file   Years of education: Not on file   Highest education level: Not on file  Occupational History   Not on file  Tobacco Use   Smoking status: Never   Smokeless tobacco: Never  Vaping Use   Vaping Use: Never used  Substance and Sexual Activity   Alcohol use: Not Currently    Comment: occ.  Drug use: No   Sexual activity: Not on file    Comment: monogamous female partner for 5 years (2010)  Other Topics Concern   Not on file  Social History Narrative   Not on file   Social  Determinants of Health   Financial Resource Strain: High Risk (05/28/2021)   Overall Financial Resource Strain (CARDIA)    Difficulty of Paying Living Expenses: Very hard  Food Insecurity: Food Insecurity Present (05/28/2021)   Hunger Vital Sign    Worried About Running Out of Food in the Last Year: Often true    Ran Out of Food in the Last Year: Often true  Transportation Needs: No Transportation Needs (03/20/2021)   PRAPARE - Hydrologist (Medical): No    Lack of Transportation (Non-Medical): No  Physical Activity: Not on file  Stress: Not on file  Social Connections: Not on file  Intimate Partner Violence: Not on file    Family History  Problem Relation Age of Onset   Breast cancer Sister       Dortha Kern, MD 10/07/21 1056

## 2021-10-07 NOTE — ED Triage Notes (Signed)
Also complaining of toes itching.  3 weeks since onset

## 2021-10-07 NOTE — ED Triage Notes (Signed)
Complains of discharge from ears.  Onset 3 weeks ago  Difficulty with finding the translater patient wants.  Multiple attempts.  Translator claims to speak ewe, but patient is screaming it is spanish and she does not want spanish

## 2021-10-08 ENCOUNTER — Other Ambulatory Visit: Payer: Self-pay

## 2021-10-28 ENCOUNTER — Ambulatory Visit (INDEPENDENT_AMBULATORY_CARE_PROVIDER_SITE_OTHER): Payer: 59

## 2021-10-28 ENCOUNTER — Ambulatory Visit (HOSPITAL_COMMUNITY)
Admission: EM | Admit: 2021-10-28 | Discharge: 2021-10-28 | Disposition: A | Discharge status: Home / Self Care | Payer: 59 | Attending: Internal Medicine | Admitting: Internal Medicine

## 2021-10-28 ENCOUNTER — Other Ambulatory Visit: Payer: Self-pay

## 2021-10-28 ENCOUNTER — Encounter (HOSPITAL_COMMUNITY): Payer: Self-pay

## 2021-10-28 DIAGNOSIS — M79641 Pain in right hand: Secondary | ICD-10-CM

## 2021-10-28 MED ORDER — MELOXICAM 15 MG PO TABS
15.0000 mg | ORAL_TABLET | Freq: Every day | ORAL | 0 refills | Status: DC
  Filled 2021-10-28: qty 30, 30d supply, fill #0

## 2021-10-28 NOTE — ED Triage Notes (Signed)
Patient having right wrist pain after packing at work. States the boxes are very heavy.  Onset last week. Patient having reduced range of motion. States the wrist started swelling 3 months ago.  Patient has been using OTC arthritis pain gel with no relief.  No medical history of injuries to this wrist.

## 2021-10-28 NOTE — ED Provider Notes (Signed)
[facility]   [id number] 10/28/21 Arrival Time: 5621  ASSESSMENT & PLAN:  1. Right hand pain    -X-rays negative for fracture.  Suspect overuse strain of flexor carpi ulnaris and extensor carpi ulnaris.  We will call her in Mobic to take once daily for its anti-inflammatory effect.  Also provided her with a wrist brace today to wear while working.  Reassurance was provided.  All questions answered she agrees to plan.  Meds ordered this encounter  Medications   meloxicam (MOBIC) 15 MG tablet    Sig: Take 1 tablet (15 mg total) by mouth daily.    Dispense:  30 tablet    Refill:  0     Discharge Instructions      There are no broken bones in your hand or wrist Try taking Mobic 1 pill per day Try wearing the wrist brace too It was nice to meet you!      Follow-up Information     Elsie Stain, MD.   Specialty: Pulmonary Disease Why: If symptoms worsen Contact information: [address] [place] [id number] [phone]                  Reviewed expectations re: course of current medical issues. Questions answered. Outlined signs and symptoms indicating need for more acute intervention. Patient verbalized understanding. After Visit Summary given.   SUBJECTIVE: Pleasant 51 year old female comes urgent care to be evaluated for right wrist pain.  This interaction was aided by the use of a [place] interpreter.  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  She does repetitive lifting motions with her hands.  Her pain started a week ago is located over the fifth metacarpal.  She says it also became swollen there last week.  Her wrist had some swelling in it several months ago as well.  She has been using over-the-counter arthritis pain gel with no relief.  No history of prior injuries to the wrist.  Denies any inciting trauma.  Patient's last menstrual period was 09/12/2017 (lmp unknown). Past Surgical History:  Procedure Laterality  Date   DILITATION & CURRETTAGE/HYSTROSCOPY WITH NOVASURE ABLATION N/A 12/22/2017   Procedure: DILATATION & CURETTAGE/ HYSTEROSCOPY WITH ENDOMETRIAL FIBROID AND  NOVASURE ABLATION;  Surgeon: Lavonia Drafts, MD;  Location: [facility];  Service: Gynecology;  Laterality: N/A;     OBJECTIVE:  Vitals:   10/28/21 0931  BP: 124/80  Pulse: 70  Resp: 16  Temp: 98.2 F (36.8 C)  TempSrc: Oral  SpO2: 98%     Physical Exam Vitals reviewed.  Constitutional:      General: She is not in acute distress. Cardiovascular:     Rate and Rhythm: Normal rate.  Pulmonary:     Effort: Pulmonary effort is normal.  Musculoskeletal:     Comments: R Hand -no obvious deformity.  Tender to palpation over the fifth metacarpal shaft.  Nontender at MCPs or any phalanx distally.  No anatomical snuffbox tenderness.  Full range of motion in all fingers of an 5/5 prescription Pilar Plate.  Full range of motion in wrist flexion extension.  Sensation intact to light touch.  2+ radial pulse.  Skin:    General: Skin is warm.     Findings: No bruising.  Neurological:     General: No focal deficit present.      Labs: Results for orders placed or performed in visit on 05/28/21  Comprehensive metabolic panel  Result Value Ref Range   Glucose  58 (L) 70 - 99 mg/dL   BUN 7 6 - 24 mg/dL   Creatinine, Ser 0.57 0.57 - 1.00 mg/dL   eGFR 109 >59 mL/min/1.73   BUN/Creatinine Ratio 12 9 - 23   Sodium 137 134 - 144 mmol/L   Potassium 4.1 3.5 - 5.2 mmol/L   Chloride 99 96 - 106 mmol/L   CO2 25 20 - 29 mmol/L   Calcium 9.5 8.7 - 10.2 mg/dL   Total Protein 6.7 6.0 - 8.5 g/dL   Albumin 4.3 3.8 - 4.9 g/dL   Globulin, Total 2.4 1.5 - 4.5 g/dL   Albumin/Globulin Ratio 1.8 1.2 - 2.2   Bilirubin Total 0.5 0.0 - 1.2 mg/dL   Alkaline Phosphatase 59 44 - 121 IU/L   AST 25 0 - 40 IU/L   ALT 25 0 - 32 IU/L  CBC with Differential/Platelet  Result Value Ref Range   WBC 8.9 3.4 - 10.8 x10E3/uL   RBC 5.18 3.77 - 5.28 x10E6/uL    Hemoglobin 13.4 11.1 - 15.9 g/dL   Hematocrit 39.6 34.0 - 46.6 %   MCV 76 (L) 79 - 97 fL   MCH 25.9 (L) 26.6 - 33.0 pg   MCHC 33.8 31.5 - 35.7 g/dL   RDW 14.2 11.7 - 15.4 %   Platelets 234 150 - 450 x10E3/uL   Neutrophils 62 Not Estab. %   Lymphs 26 Not Estab. %   Monocytes 9 Not Estab. %   Eos 3 Not Estab. %   Basos 0 Not Estab. %   Neutrophils Absolute 5.5 1.4 - 7.0 x10E3/uL   Lymphocytes Absolute 2.4 0.7 - 3.1 x10E3/uL   Monocytes Absolute 0.8 0.1 - 0.9 x10E3/uL   EOS (ABSOLUTE) 0.2 0.0 - 0.4 x10E3/uL   Basophils Absolute 0.0 0.0 - 0.2 x10E3/uL   Immature Granulocytes 0 Not Estab. %   Immature Grans (Abs) 0.0 0.0 - 0.1 x10E3/uL  Lipid panel  Result Value Ref Range   Cholesterol, Total 164 100 - 199 mg/dL   Triglycerides 83 0 - 149 mg/dL   HDL 69 >39 mg/dL   VLDL Cholesterol Cal 15 5 - 40 mg/dL   LDL Chol Calc (NIH) 80 0 - 99 mg/dL   Chol/HDL Ratio 2.4 0.0 - 4.4 ratio   Labs Reviewed - No data to display  Imaging: DG Hand Complete Right  Result Date: 10/28/2021 CLINICAL DATA:  Pain, reduced range of motion EXAM: RIGHT HAND - COMPLETE 3+ VIEW COMPARISON:  None Available. FINDINGS: There is no evidence of fracture or dislocation. There is no evidence of arthropathy or other focal bone abnormality. Soft tissues are unremarkable. IMPRESSION: Negative. Electronically Signed   By: Lucrezia Europe M.D.   On: 10/28/2021 10:12     Allergies  Allergen Reactions   Other Rash    Blood pressure pill (unknown name)                                               Past Medical History:  Diagnosis Date   #[id number]    Acne    Back pain    Essential hypertension    Seasonal allergies    Syncope 09/16/2014   Syncope and collapse 09/16/2014    Social History   Socioeconomic History   Marital status: Married    Spouse name: Not on file   Number of children: Not on  file   Years of education: Not on file   Highest education level: Not on file  Occupational History   Not on file  Tobacco  Use   Smoking status: Never   Smokeless tobacco: Never  Vaping Use   Vaping Use: Never used  Substance and Sexual Activity   Alcohol use: Not Currently    Comment: occ.   Drug use: No   Sexual activity: Not on file    Comment: monogamous female partner for 5 years (2010)  Other Topics Concern   Not on file  Social History Narrative   Not on file   Social Determinants of Health   Financial Resource Strain: High Risk (05/28/2021)   Overall Financial Resource Strain (CARDIA)    Difficulty of Paying Living Expenses: Very hard  Food Insecurity: Food Insecurity Present (05/28/2021)   Hunger Vital Sign    Worried About Running Out of Food in the Last Year: Often true    Ran Out of Food in the Last Year: Often true  Transportation Needs: No Transportation Needs (03/20/2021)   PRAPARE - Hydrologist (Medical): No    Lack of Transportation (Non-Medical): No  Physical Activity: Not on file  Stress: Not on file  Social Connections: Not on file  Intimate Partner Violence: Not on file    Family History  Problem Relation Age of Onset   Breast cancer Sister       Dortha Kern, MD 10/28/21 1105

## 2021-10-28 NOTE — Discharge Instructions (Addendum)
There are no broken bones in your hand or wrist Try taking Mobic 1 pill per day Try wearing the wrist brace too It was nice to meet you!

## 2021-11-06 ENCOUNTER — Other Ambulatory Visit: Payer: Self-pay

## 2021-11-06 ENCOUNTER — Ambulatory Visit (INDEPENDENT_AMBULATORY_CARE_PROVIDER_SITE_OTHER): Payer: 59 | Admitting: Family Medicine

## 2021-11-06 ENCOUNTER — Encounter: Payer: Self-pay | Admitting: Family Medicine

## 2021-11-06 VITALS — BP 141/76 | HR 72

## 2021-11-06 DIAGNOSIS — Z9889 Other specified postprocedural states: Secondary | ICD-10-CM | POA: Diagnosis not present

## 2021-11-06 DIAGNOSIS — N857 Hematometra: Secondary | ICD-10-CM | POA: Diagnosis not present

## 2021-11-06 DIAGNOSIS — I10 Essential (primary) hypertension: Secondary | ICD-10-CM | POA: Diagnosis not present

## 2021-11-06 DIAGNOSIS — D259 Leiomyoma of uterus, unspecified: Secondary | ICD-10-CM | POA: Diagnosis not present

## 2021-11-06 MED ORDER — NORETHINDRONE ACETATE 5 MG PO TABS
5.0000 mg | ORAL_TABLET | Freq: Every day | ORAL | 2 refills | Status: DC
  Filled 2021-11-06: qty 30, 30d supply, fill #0

## 2021-11-06 NOTE — Assessment & Plan Note (Signed)
Go home and take your meds today

## 2021-11-06 NOTE — Assessment & Plan Note (Signed)
Continues to be present but doing well without any pain. Continue Aygestin.

## 2021-11-06 NOTE — Progress Notes (Signed)
   Subjective:    Patient ID: Cindy Garrison is a 51 y.o. female presenting with Appointment  on 11/06/2021  HPI: Patient is here for f/u of hematometra. She has been on Aygestin with excellent results and has no complaints. She denies pain or bleeding.  Review of Systems  Constitutional:  Negative for chills and fever.  Respiratory:  Negative for shortness of breath.   Cardiovascular:  Negative for chest pain.  Gastrointestinal:  Negative for abdominal pain, nausea and vomiting.  Genitourinary:  Negative for dysuria.  Skin:  Negative for rash.      Objective:    BP (!) 141/76   Pulse 72   LMP 09/12/2017 (LMP Unknown)  Physical Exam Exam conducted with a chaperone present.  Constitutional:      General: She is not in acute distress.    Appearance: She is well-developed.  HENT:     Head: Normocephalic and atraumatic.  Eyes:     General: No scleral icterus. Cardiovascular:     Rate and Rhythm: Normal rate.  Pulmonary:     Effort: Pulmonary effort is normal.  Abdominal:     Palpations: Abdomen is soft. There is mass (28 week size).  Musculoskeletal:     Cervical back: Neck supple.  Skin:    General: Skin is warm and dry.  Neurological:     Mental Status: She is alert and oriented to person, place, and time.         Assessment & Plan:   Problem List Items Addressed This Visit       Unprioritized   Essential hypertension - Primary    Go home and take your meds today      Fibroid uterus   History of endometrial ablation   Hematometra    Continues to be present but doing well without any pain. Continue Aygestin.      Relevant Medications   norethindrone (AYGESTIN) 5 MG tablet     Return in about 3 months (around 02/06/2022) for a follow-up.  Donnamae Jude, MD 11/06/2021 8:35 AM

## 2021-11-09 ENCOUNTER — Other Ambulatory Visit: Payer: Self-pay

## 2021-11-09 ENCOUNTER — Encounter (HOSPITAL_COMMUNITY): Payer: Self-pay | Admitting: Emergency Medicine

## 2021-11-09 ENCOUNTER — Ambulatory Visit (HOSPITAL_COMMUNITY)
Admission: EM | Admit: 2021-11-09 | Discharge: 2021-11-09 | Disposition: A | Discharge status: Home / Self Care | Payer: 59 | Attending: Family Medicine | Admitting: Family Medicine

## 2021-11-09 DIAGNOSIS — B353 Tinea pedis: Secondary | ICD-10-CM

## 2021-11-09 DIAGNOSIS — Z760 Encounter for issue of repeat prescription: Secondary | ICD-10-CM

## 2021-11-09 DIAGNOSIS — H60393 Other infective otitis externa, bilateral: Secondary | ICD-10-CM | POA: Diagnosis not present

## 2021-11-09 MED ORDER — CLOTRIMAZOLE 1 % EX CREA: 1.0000 | TOPICAL_CREAM | Freq: Two times a day (BID) | CUTANEOUS | 0 refills | Status: DC

## 2021-11-09 MED ORDER — NEOMYCIN-POLYMYXIN-HC 3.5-10000-1 OT SUSP: 4.0000 [drp] | Freq: Three times a day (TID) | OTIC | 0 refills | Status: DC

## 2021-11-09 NOTE — ED Triage Notes (Signed)
Using interpretor for [facility]WE. Pt reports was taking medications for both ears and out of it and when went to pharmacy told need approval from doctor. Pt reports fluid coming out of ears and very painful-that has been going on for a long time.   Pt also taking medications for her legs and needing more medication for it. Legs are peeling since last week.   Does have a PCP but appt time to see them not yet.

## 2021-11-09 NOTE — ED Provider Notes (Signed)
[facility]    CSN: [id number] Arrival date & time: 11/09/21  1010      History   Chief Complaint Chief Complaint  Patient presents with   Medication Refill    HPI Cindy Garrison is a 51 y.o. female.   Interpreter used today.  Patient is here for several medication refills.  She has the medications with her today.  She has boxes of clotrimazole 1% cream for her foot,  And otic drops for her ears. (Neomycin and polymyxin b) for otitis externa, given in sept.  Her symptoms did resolve, but then returned yesterday.  Having discharge, and pain from the ears.   Given clotrimazole for tinea.  This did help as well, but her symptoms have returned as well.   Past Medical History:  Diagnosis Date   #[id number]    Acne    Back pain    Essential hypertension    Seasonal allergies    Syncope 09/16/2014   Syncope and collapse 09/16/2014    Patient Active Problem List   Diagnosis Date Noted   Chronic dental pain 08/04/2021   Acne vulgaris 05/28/2021   At moderate risk for fall 05/28/2021   Food insecurity 05/28/2021   Financial insecurity 05/28/2021   Language barrier affecting health care, [place] is not well understood 05/28/2021   Hematometra 03/20/2021   Ovarian mass 11/10/2020   Cervical os stenosis 11/10/2020   History of endometrial ablation 04/09/2020   Screening mammogram for breast cancer 04/09/2020   Cervical cancer screening 01/23/2020   Pure hypercholesterolemia 01/23/2020   Colon cancer screening 01/23/2020   Vitamin D deficiency 01/23/2020   Fibroid uterus 03/26/2015   Schizoaffective disorder ([place]) 09/16/2014   Essential hypertension    Allergic rhinitis 09/13/2006    Past Surgical History:  Procedure Laterality Date   DILITATION & CURRETTAGE/HYSTROSCOPY WITH NOVASURE ABLATION N/A 12/22/2017   Procedure: DILATATION & CURETTAGE/ HYSTEROSCOPY WITH ENDOMETRIAL FIBROID AND  NOVASURE ABLATION;  Surgeon: Lavonia Drafts, MD;  Location: [facility];  Service: Gynecology;  Laterality: N/A;    OB History     Gravida  3   Para  0   Term  0   Preterm  0   AB  3   Living  0      SAB  3   IAB  0   Ectopic  0   Multiple  0   Live Births               Home Medications    Prior to Admission medications   Medication Sig Start Date End Date Taking? Authorizing Provider  acetaminophen (TYLENOL) 500 MG tablet Take 500 mg by mouth every 6 (six) hours as needed for mild pain or fever.    [provider]  atorvastatin (LIPITOR) 20 MG tablet Take 1 tablet (20 mg total) by mouth daily. 05/28/21 05/28/22  Elsie Stain, MD  cetirizine (ZYRTEC) 10 MG tablet Take 1 tablet (10 mg total) by mouth daily. 08/04/21   Elsie Stain, MD  clotrimazole (LOTRIMIN) 1 % cream Apply to affected area 2 times daily 10/07/21   Rafoth, Dorian Pod, MD  meloxicam (MOBIC) 15 MG tablet Take 1 tablet (15 mg total) by mouth daily. 10/28/21 11/27/21  Rafoth, Dorian Pod, MD  neomycin-polymyxin-hydrocortisone (CORTISPORIN) 3.5-10000-1 OTIC suspension Place 4 drops into both ears 3 (three) times daily. 10/07/21   Rafoth, Dorian Pod, MD  norethindrone (AYGESTIN) 5 MG tablet Take 1 tablet (5 mg total) by mouth  daily. 11/06/21   Donnamae Jude, MD  triamcinolone ointment (KENALOG) 0.1 % SMARTSIG:1 Topical Twice Daily PRN 06/08/21   [provider]  valsartan-hydrochlorothiazide (DIOVAN HCT) 80-12.5 MG tablet Take 1 tablet by mouth daily. 10/01/21   Elsie Stain, MD  Vitamin D, Ergocalciferol, (DRISDOL) 1.25 MG (50000 UNIT) CAPS capsule Take 1 capsule (50,000 Units total) by mouth every 7 (seven) days on Wednesday 05/28/21 05/28/22  Elsie Stain, MD  hydrochlorothiazide (HYDRODIURIL) 25 MG tablet Take 1 tablet (25 mg total) by mouth daily. 09/20/19 01/23/20  Charlott Rakes, MD  lisinopril (ZESTRIL) 40 MG tablet TAKE 1 TABLET (40 MG TOTAL) BY MOUTH DAILY. TO LOWER BLOOD PRESSURE 01/08/20 01/23/20  Charlott Rakes, MD  omeprazole (PRILOSEC) 40  MG capsule Take 1 capsule (40 mg total) by mouth 2 (two) times daily before a meal. 12/28/18 12/29/18  Antony Blackbird, MD    Family History Family History  Problem Relation Age of Onset   Breast cancer Sister     Social History Social History   Tobacco Use   Smoking status: Never   Smokeless tobacco: Never  Vaping Use   Vaping Use: Never used  Substance Use Topics   Alcohol use: Not Currently    Comment: occ.   Drug use: No     Allergies   Other   Review of Systems Review of Systems  Constitutional: Negative.   HENT:  Positive for ear discharge and ear pain.   Respiratory: Negative.    Cardiovascular: Negative.   Gastrointestinal: Negative.   Genitourinary: Negative.   Skin:  Positive for rash.     Physical Exam Triage Vital Signs ED Triage Vitals  Enc Vitals Group     BP 11/09/21 1208 (!) 185/111     Pulse Rate 11/09/21 1208 70     Resp 11/09/21 1208 15     Temp 11/09/21 1208 97.9 F (36.6 C)     Temp Source 11/09/21 1208 Oral     SpO2 11/09/21 1208 98 %     Weight --      Height --      Head Circumference --      Peak Flow --      Pain Score 11/09/21 1217 2     Pain Loc --      Pain Edu? --      Excl. in [place]? --    No data found.  Updated Vital Signs BP (!) 185/111 (BP Location: Left Arm)   Pulse 70   Temp 97.9 F (36.6 C) (Oral)   Resp 15   LMP 09/12/2017 (LMP Unknown)   SpO2 98%   Visual Acuity Right Eye Distance:   Left Eye Distance:   Bilateral Distance:    Right Eye Near:   Left Eye Near:    Bilateral Near:     Physical Exam Constitutional:      Appearance: Normal appearance.  HENT:     Head:     Comments: No pain at the ears;  slight d/c noted at the canals bilaterally Cardiovascular:     Rate and Rhythm: Normal rate and regular rhythm.  Pulmonary:     Effort: Pulmonary effort is normal.     Breath sounds: Normal breath sounds.  Skin:    Comments: At the tops of the feet bilaterally is slight peeling of the skin at the  bases of the toes, from the 1st - 3rd toes bilaterally  Neurological:     General: No focal deficit present.  Mental Status: She is alert.  Psychiatric:        Mood and Affect: Mood normal.      UC Treatments / Results  Labs (all labs ordered are listed, but only abnormal results are displayed) Labs Reviewed - No data to display  EKG   Radiology No results found.  Procedures Procedures (including critical care time)  Medications Ordered in UC Medications - No data to display  Initial Impression / Assessment and Plan / UC Course  I have reviewed the triage vital signs and the nursing notes.  Pertinent labs & imaging results that were available during my care of the patient were reviewed by me and considered in my medical decision making (see chart for details).    Final Clinical Impressions(s) / UC Diagnoses   Final diagnoses:  Medication refill  Infective otitis externa of both ears  Tinea pedis of both feet     Discharge Instructions      Vous avez t vu aujourd'hui pour divers problmes. J'ai renouvel vos mdicaments pour vous aujourd'hui. Votre tension artrielle est galement leve aujourd'hui. Veuillez contacter votre fournisseur de soins primaires  ce sujet, ainsi que si vos problmes ne se rsolvent pas avec les mdicaments administrs.  You were seen today for vaious issues.  I have refilled your medications for you today.  Your blood pressure is elevated today as well.  Please follow up with your primary care provider for this, as well as if your issues are not resolving with the medications given.     ED Prescriptions     Medication Sig Dispense Auth. Provider   clotrimazole (LOTRIMIN) 1 % cream Apply to affected area 2 times daily 30 g Vlad Mayberry, MD   neomycin-polymyxin-hydrocortisone (CORTISPORIN) 3.5-10000-1 OTIC suspension Place 4 drops into both ears 3 (three) times daily. 10 mL Rondel Oh, MD      PDMP not reviewed this  encounter.   Rondel Oh, MD 11/09/21 1257

## 2021-11-09 NOTE — Discharge Instructions (Addendum)
Vous avez t vu aujourd'hui pour divers problmes. J'ai renouvel vos mdicaments pour vous aujourd'hui. Votre tension artrielle est galement leve aujourd'hui. Veuillez contacter votre fournisseur de soins primaires  ce sujet, ainsi que si vos problmes ne se rsolvent pas avec les mdicaments administrs.  You were seen today for vaious issues.  I have refilled your medications for you today.  Your blood pressure is elevated today as well.  Please follow up with your primary care provider for this, as well as if your issues are not resolving with the medications given.

## 2021-11-10 ENCOUNTER — Ambulatory Visit (HOSPITAL_COMMUNITY)
Admission: EM | Admit: 2021-11-10 | Discharge: 2021-11-10 | Disposition: A | Discharge status: Home / Self Care | Payer: 59

## 2021-11-10 NOTE — ED Notes (Signed)
Discharge instructions provided and Pt stated understanding. Pt alert, orient and ambulatory prior to d/c from facility. Personal belongings returned. Pt escorted to the lobby to d/c from facility. Safety maintained.   

## 2021-11-10 NOTE — ED Triage Notes (Signed)
Pt presents to [facility] voluntarily seeking assistance with DSS. Pt reports difficulty with her husband and difficulty maintaining employment.Pt left before seeing a provider. Pt denies SI/HI and AVH.

## 2021-11-11 ENCOUNTER — Ambulatory Visit: Payer: 59 | Attending: Critical Care Medicine | Admitting: Physician Assistant

## 2021-11-11 ENCOUNTER — Encounter: Payer: Self-pay | Admitting: Physician Assistant

## 2021-11-11 ENCOUNTER — Other Ambulatory Visit: Payer: Self-pay

## 2021-11-11 ENCOUNTER — Ambulatory Visit: Payer: 59 | Admitting: Critical Care Medicine

## 2021-11-11 VITALS — BP 159/88 | HR 87 | Wt 141.2 lb

## 2021-11-11 DIAGNOSIS — L853 Xerosis cutis: Secondary | ICD-10-CM

## 2021-11-11 DIAGNOSIS — I10 Essential (primary) hypertension: Secondary | ICD-10-CM

## 2021-11-11 DIAGNOSIS — Z09 Encounter for follow-up examination after completed treatment for conditions other than malignant neoplasm: Secondary | ICD-10-CM

## 2021-11-11 DIAGNOSIS — R609 Edema, unspecified: Secondary | ICD-10-CM | POA: Diagnosis not present

## 2021-11-11 DIAGNOSIS — H539 Unspecified visual disturbance: Secondary | ICD-10-CM | POA: Diagnosis not present

## 2021-11-11 DIAGNOSIS — J301 Allergic rhinitis due to pollen: Secondary | ICD-10-CM

## 2021-11-11 MED ORDER — LEVOCETIRIZINE DIHYDROCHLORIDE 5 MG PO TABS: 5.0000 mg | ORAL_TABLET | Freq: Every evening | ORAL | 1 refills | Status: DC

## 2021-11-11 MED ORDER — FUROSEMIDE 20 MG PO TABS: ORAL_TABLET | ORAL | 3 refills | Status: DC

## 2021-11-11 NOTE — Progress Notes (Signed)
Patient ID: Cindy Garrison, female   DOB: Mar 04, 1970, 51 y.o.   MRN: [id number]    Cindy Garrison, is a 51 y.o. female  WVP:[id number]  NIO:[id number]  DOB - [birth year]  No chief complaint on file.      Subjective:   Cindy Garrison is a 51 y.o. female here today for a follow up visit  After being seen at the ED 10/23:  Cindy Garrison is a 51 y.o. female.    Interpreter used today.  Patient is here for several medication refills.  She has the medications with her today.  She has boxes of clotrimazole 1% cream for her foot,  And otic drops for her ears. (Neomycin and polymyxin b) for otitis externa, given in sept.  Her symptoms did resolve, but then returned yesterday.  Having discharge, and pain from the ears.   Given clotrimazole for tinea.  This did help as well, but her symptoms have returned as well.   Today she is wanting to see a specialist for recurrent ear issues.  Drops help.  She also wants to get prescription safety glasses made.    She is still having problems with leg swelling.  Compression stockings help but she wants to know why it occurs.  No CP/SOB/cough.    Wants to know if she can use tylenol for aches and pains in addition to her prescription meds  No problems updated.  ALLERGIES: Allergies  Allergen Reactions   Other Rash    Blood pressure pill (unknown name)    PAST MEDICAL HISTORY: Past Medical History:  Diagnosis Date   #[id number]    Acne    Back pain    Essential hypertension    Seasonal allergies    Syncope 09/16/2014   Syncope and collapse 09/16/2014    MEDICATIONS AT HOME: Prior to Admission medications   Medication Sig Start Date End Date Taking? Authorizing Provider  furosemide (LASIX) 20 MG tablet Take 1 daily for 3 days as needed for swelling (use sparingly) 11/11/21  Yes Lee-Ann Gal, Dionne Bucy, PA-C  levocetirizine (XYZAL) 5 MG tablet Take 1 tablet (5 mg total) by mouth every evening. 11/11/21  Yes Freeman Caldron M, PA-C   acetaminophen (TYLENOL) 500 MG tablet Take 500 mg by mouth every 6 (six) hours as needed for mild pain or fever.    [provider]  atorvastatin (LIPITOR) 20 MG tablet Take 1 tablet (20 mg total) by mouth daily. 05/28/21 05/28/22  Elsie Stain, MD  clotrimazole (LOTRIMIN) 1 % cream Apply to affected area 2 times daily 11/09/21   Rondel Oh, MD  meloxicam (MOBIC) 15 MG tablet Take 1 tablet (15 mg total) by mouth daily. 10/28/21 11/27/21  Rafoth, Dorian Pod, MD  neomycin-polymyxin-hydrocortisone (CORTISPORIN) 3.5-10000-1 OTIC suspension Place 4 drops into both ears 3 (three) times daily. 11/09/21   Piontek, Junie Panning, MD  norethindrone (AYGESTIN) 5 MG tablet Take 1 tablet (5 mg total) by mouth daily. 11/06/21   Donnamae Jude, MD  triamcinolone ointment (KENALOG) 0.1 % SMARTSIG:1 Topical Twice Daily PRN 06/08/21   [provider]  valsartan-hydrochlorothiazide (DIOVAN HCT) 80-12.5 MG tablet Take 1 tablet by mouth daily. 10/01/21   Elsie Stain, MD  Vitamin D, Ergocalciferol, (DRISDOL) 1.25 MG (50000 UNIT) CAPS capsule Take 1 capsule (50,000 Units total) by mouth every 7 (seven) days on Wednesday 05/28/21 05/28/22  Elsie Stain, MD  hydrochlorothiazide (HYDRODIURIL) 25 MG tablet Take 1 tablet (25 mg total) by mouth daily. 09/20/19 01/23/20  Charlott Rakes,  MD  lisinopril (ZESTRIL) 40 MG tablet TAKE 1 TABLET (40 MG TOTAL) BY MOUTH DAILY. TO LOWER BLOOD PRESSURE 01/08/20 01/23/20  Charlott Rakes, MD  omeprazole (PRILOSEC) 40 MG capsule Take 1 capsule (40 mg total) by mouth 2 (two) times daily before a meal. 12/28/18 12/29/18  Fulp, Cammie, MD    ROS: Neg HEENT Neg resp Neg cardiac Neg GI Neg GU Neg MS Neg psych Neg neuro  Objective:   Vitals:   11/11/21 0858  BP: (!) 159/88  Pulse: 87  SpO2: 99%  Weight: 141 lb 3.2 oz (64 kg)   Exam General appearance : Awake, alert, not in any distress. Speech Clear. Not toxic looking HEENT: Atraumatic and Normocephalic, pupils  equally reactive to light and accomodation Neck: Supple, no JVD. No cervical lymphadenopathy.  Chest: Good air entry bilaterally, CTAB.  No rales/rhonchi/wheezing CVS: S1 S2 regular, no murmurs.  Abdomen: Bowel sounds present, Non tender and not distended with no gaurding, rigidity or rebound. Extremities: B/L Lower Ext shows no edema, both legs are warm to touch Neurology: Awake alert, and oriented X 3, CN II-XII intact, Non focal Skin: No Rash  Data Review Lab Results  Component Value Date   HGBA1C 5.5 01/24/2018   HGBA1C 4.8 09/12/2017    Assessment & Plan   1. Allergic rhinitis due to pollen, unspecified seasonality D/c zyrtec - levocetirizine (XYZAL) 5 MG tablet; Take 1 tablet (5 mg total) by mouth every evening.  Dispense: 90 tablet; Refill: 1  2. Dry skin On ears and feet-currently being treated for tinea pedis and otitis externa but I am wondering if she may have seborrhea or psoriasis - Ambulatory referral to derm - levocetirizine (XYZAL) 5 MG tablet; Take 1 tablet (5 mg total) by mouth every evening.  Dispense: 90 tablet; Refill: 1  3. Vision changes Wants to have prescription safety glasses made - Ambulatory referral to Ophthalmology  4. Edema, unspecified type Patient information given  - Basic metabolic panel - TSH - furosemide (LASIX) 20 MG tablet; Take 1 daily for 3 days as needed for swelling (use sparingly)  Dispense: 30 tablet; Refill: 3  5. Essential hypertension Continue valsartan/HCT - Basic metabolic panel  6. Hospital discharge follow-up   Patient did not want to use french interpreter.  We were unable to get a [place] interpreter on the spot.  I understand minimal french and she had things written down that I was able to read in E[place] that is very similar to written french.  All issues addressed in English/written french/Ewe.    Return in about 2 months (around 01/11/2022) for PCP-NEEDS EWE interpreter for next appt(has to be scheduled).  The patient  was given clear instructions to go to ER or return to medical center if symptoms don't improve, worsen or new problems develop. The patient verbalized understanding. The patient was told to call to get lab results if they haven't heard anything in the next week.      Freeman Caldron, PA-C [facility] [place], [place]   11/11/2021, 11:16 AM

## 2021-11-11 NOTE — Patient Instructions (Signed)
Use tylenol for pain or headaches as needed  Edema  Edema is an abnormal buildup of fluids in the body tissues and under the skin. Swelling of the legs, feet, and ankles is a common symptom that becomes more likely as you get older. Swelling is also common in looser tissues, such as around the eyes. Pressing on the area may make a temporary dent in your skin (pitting edema). This fluid may also accumulate in your lungs (pulmonary edema). There are many possible causes of edema. Eating too much salt (sodium) and being on your feet or sitting for a long time can cause edema in your legs, feet, and ankles. Common causes of edema include: Certain medical conditions, such as heart failure, liver or kidney disease, and cancer. Weak leg blood vessels. An injury. Pregnancy. Medicines. Being obese. Low protein levels in the blood. Hot weather may make edema worse. Edema is usually painless. Your skin may look swollen or shiny. Follow these instructions at home: Medicines Take over-the-counter and prescription medicines only as told by your health care provider. Your health care provider may prescribe a medicine to help your body get rid of extra water (diuretic). Take this medicine if you are told to take it. Eating and drinking Eat a low-salt (low-sodium) diet to reduce fluid as told by your health care provider. Sometimes, eating less salt may reduce swelling. Depending on the cause of your swelling, you may need to limit how much fluid you drink (fluid restriction). General instructions Raise (elevate) the injured area above the level of your heart while you are sitting or lying down. Do not sit still or stand for long periods of time. Do not wear tight clothing. Do not wear garters on your upper legs. Exercise your legs to get your circulation going. This helps to move the fluid back into your blood vessels, and it may help the swelling go down. Wear compression stockings as told by your health  care provider. These stockings help to prevent blood clots and reduce swelling in your legs. It is important that these are the correct size. These stockings should be prescribed by your health care provider to prevent possible injuries. If elastic bandages or wraps are recommended, use them as told by your health care provider. Contact a health care provider if: Your edema does not get better with treatment. You have heart, liver, or kidney disease and have symptoms of edema. You have sudden and unexplained weight gain. Get help right away if: You develop shortness of breath or chest pain. You cannot breathe when you lie down. You develop pain, redness, or warmth in the swollen areas. You have heart, liver, or kidney disease and suddenly get edema. You have a fever and your symptoms suddenly get worse. These symptoms may be an emergency. Get help right away. Call 911. Do not wait to see if the symptoms will go away. Do not drive yourself to the hospital. Summary Edema is an abnormal buildup of fluids in the body tissues and under the skin. Eating too much salt (sodium)and being on your feet or sitting for a long time can cause edema in your legs, feet, and ankles. Raise (elevate) the injured area above the level of your heart while you are sitting or lying down. Follow your health care provider's instructions about diet and how much fluid you can drink. This information is not intended to replace advice given to you by your health care provider. Make sure you discuss any questions you have  with your health care provider. Document Revised: 09/08/2020 Document Reviewed: 09/08/2020 Elsevier Patient Education  [employer].

## 2021-11-12 LAB — BASIC METABOLIC PANEL
BUN/Creatinine Ratio: 11 (ref 9–23)
BUN: 7 mg/dL (ref 6–24)
CO2: 23 mmol/L (ref 20–29)
Calcium: 9 mg/dL (ref 8.7–10.2)
Chloride: 99 mmol/L (ref 96–106)
Creatinine, Ser: 0.64 mg/dL (ref 0.57–1.00)
Glucose: 92 mg/dL (ref 70–99)
Potassium: 4 mmol/L (ref 3.5–5.2)
Sodium: 137 mmol/L (ref 134–144)
eGFR: 106 mL/min/{1.73_m2} (ref >59)

## 2021-11-12 LAB — TSH: TSH: 1.48 u[IU]/mL (ref 0.450–4.500)

## 2021-11-23 ENCOUNTER — Encounter: Payer: Self-pay | Admitting: *Deleted

## 2021-12-04 ENCOUNTER — Other Ambulatory Visit: Payer: Self-pay

## 2022-01-01 ENCOUNTER — Other Ambulatory Visit: Payer: Self-pay

## 2022-01-01 ENCOUNTER — Emergency Department (HOSPITAL_COMMUNITY)
Admission: EM | Admit: 2022-01-01 | Discharge: 2022-01-02 | Disposition: A | Discharge status: Home / Self Care | Payer: 59 | Attending: Emergency Medicine | Admitting: Emergency Medicine

## 2022-01-01 DIAGNOSIS — I10 Essential (primary) hypertension: Secondary | ICD-10-CM | POA: Diagnosis not present

## 2022-01-01 DIAGNOSIS — M79672 Pain in left foot: Secondary | ICD-10-CM | POA: Diagnosis not present

## 2022-01-01 NOTE — ED Triage Notes (Signed)
Patient reports left foot pain onset today , denies injury/ambulatory.

## 2022-01-02 ENCOUNTER — Emergency Department (HOSPITAL_COMMUNITY): Payer: 59

## 2022-01-02 DIAGNOSIS — M79672 Pain in left foot: Secondary | ICD-10-CM | POA: Diagnosis not present

## 2022-01-02 NOTE — ED Provider Notes (Signed)
[facility] EMERGENCY DEPARTMENT Provider Note   CSN: [id number] Arrival date & time: 01/01/22  2158     History  Chief Complaint  Patient presents with   Foot Pain    Cindy Garrison is a 51 y.o. female.   Foot Pain  Patient is a 51 year old female with past medical history significant for schizoaffective disorder, HTN, HLD  She is present emergency room today with complaints of left foot pain.  She states that starting today while she was walking.  She denies any injury she denies any lacerations abrasions or bleeding.  She denies any fevers no other associate symptoms.  She states it is achy heel pain as well as some pain on the top of her foot.        Home Medications Prior to Admission medications   Medication Sig Start Date End Date Taking? Authorizing Provider  acetaminophen (TYLENOL) 500 MG tablet Take 500 mg by mouth every 6 (six) hours as needed for mild pain or fever.    [provider]  atorvastatin (LIPITOR) 20 MG tablet Take 1 tablet (20 mg total) by mouth daily. 05/28/21 05/28/22  Elsie Stain, MD  clotrimazole (LOTRIMIN) 1 % cream Apply to affected area 2 times daily 11/09/21   Rondel Oh, MD  furosemide (LASIX) 20 MG tablet Take 1 daily for 3 days as needed for swelling (use sparingly) 11/11/21   McClung, Dionne Bucy, PA-C  levocetirizine (XYZAL) 5 MG tablet Take 1 tablet (5 mg total) by mouth every evening. 11/11/21   Argentina Donovan, PA-C  neomycin-polymyxin-hydrocortisone (CORTISPORIN) 3.5-10000-1 OTIC suspension Place 4 drops into both ears 3 (three) times daily. 11/09/21   Piontek, Junie Panning, MD  norethindrone (AYGESTIN) 5 MG tablet Take 1 tablet (5 mg total) by mouth daily. 11/06/21   Donnamae Jude, MD  triamcinolone ointment (KENALOG) 0.1 % SMARTSIG:1 Topical Twice Daily PRN 06/08/21   [provider]  valsartan-hydrochlorothiazide (DIOVAN HCT) 80-12.5 MG tablet Take 1 tablet by mouth daily. 10/01/21   Elsie Stain, MD  Vitamin D, Ergocalciferol, (DRISDOL) 1.25 MG (50000 UNIT) CAPS capsule Take 1 capsule (50,000 Units total) by mouth every 7 (seven) days on Wednesday 05/28/21 05/28/22  Elsie Stain, MD  hydrochlorothiazide (HYDRODIURIL) 25 MG tablet Take 1 tablet (25 mg total) by mouth daily. 09/20/19 01/23/20  Charlott Rakes, MD  lisinopril (ZESTRIL) 40 MG tablet TAKE 1 TABLET (40 MG TOTAL) BY MOUTH DAILY. TO LOWER BLOOD PRESSURE 01/08/20 01/23/20  Charlott Rakes, MD  omeprazole (PRILOSEC) 40 MG capsule Take 1 capsule (40 mg total) by mouth 2 (two) times daily before a meal. 12/28/18 12/29/18  Fulp, Ander Gaster, MD      Allergies    Other    Review of Systems   Review of Systems  Physical Exam Updated Vital Signs BP (!) 151/73 (BP Location: Left Arm)   Pulse 80   Temp 98.3 F (36.8 C) (Oral)   Resp 18   LMP 09/12/2017 (LMP Unknown)   SpO2 100%  Physical Exam Vitals and nursing note reviewed.  Constitutional:      General: She is not in acute distress.    Appearance: Normal appearance. She is not ill-appearing.  HENT:     Head: Normocephalic and atraumatic.     Mouth/Throat:     Mouth: Mucous membranes are moist.  Eyes:     General: No scleral icterus.       Right eye: No discharge.  Left eye: No discharge.     Conjunctiva/sclera: Conjunctivae normal.  Cardiovascular:     Comments: DP PT pulses 3+ and symmetric Pulmonary:     Effort: Pulmonary effort is normal.     Breath sounds: No stridor.  Musculoskeletal:     Comments: Full range of motion of toes, ankle no focal bony tenderness.  Skin:    General: Skin is warm and dry.  Neurological:     Mental Status: She is alert and oriented to person, place, and time. Mental status is at baseline.  Psychiatric:        Mood and Affect: Mood normal.     ED Results / Procedures / Treatments   Labs (all labs ordered are listed, but only abnormal results are displayed) Labs Reviewed - No data to  display  EKG None  Radiology DG Foot Complete Left  Result Date: 01/02/2022 CLINICAL DATA:  Left foot pain EXAM: LEFT FOOT - COMPLETE 3+ VIEW COMPARISON:  07/31/2016 FINDINGS: There is no evidence of fracture or dislocation. There is no evidence of arthropathy or other focal bone abnormality. Soft tissues are unremarkable. IMPRESSION: Negative. Electronically Signed   By: Ulyses Jarred M.D.   On: 01/02/2022 00:50    Procedures Procedures    Medications Ordered in ED Medications - No data to display  ED Course/ Medical Decision Making/ A&P                           Medical Decision Making Amount and/or Complexity of Data Reviewed Radiology: ordered.   Patient is a 51 year old female with past medical history significant for schizoaffective disorder, HTN, HLD  She is present emergency room today with complaints of left foot pain.  She states that starting today while she was walking.  She denies any injury she denies any lacerations abrasions or bleeding.  She denies any fevers no other associate symptoms.  She states it is achy heel pain as well as some pain on the top of her foot.   Patient's physical exam was unremarkable she has normal vital signs apart from mild hypertension has good distal pulses and cap refill sensation and movement. I doubt ischemic disease in the setting of an acute nature.  She is not describing that she has claudication does not seem to be worse/better with dependency.  Does seem to be worse when she is walking or pushing on it but she does not seem to be focally or reproducibly tender in the same area from a when palpated.  X-ray personally ordered by me and I viewed this x-ray and agree with radiology read there were no acute abnormal findings.  Will discharge patient at this time.  Recommend Tylenol Motrin  Final Clinical Impression(s) / ED Diagnoses Final diagnoses:  Left foot pain    Rx / DC Orders ED Discharge Orders     None          Tedd Sias, [place] 01/02/22 Felix Pacini, April, MD 01/02/22 [phone]

## 2022-01-02 NOTE — Discharge Instructions (Signed)
I have given you the information for a podiatrist. Please follow up in their office.  Please use Tylenol or ibuprofen for pain.  You may use 600 mg ibuprofen every 6 hours or 1000 mg of Tylenol every 6 hours.  You may choose to alternate between the 2.  This would be most effective.  Not to exceed 4 g of Tylenol within 24 hours.  Not to exceed 3200 mg ibuprofen 24 hours.    Mets? nyatakakaawo na w na af?d?dala. Taflatse mikpl?e ?o le wofe ?fis. Taflatse z Tylenol alo ibuprofen na vevesese. te ?u az ibuprofen mg 600 le gafofo 6 ?esia?e me alo Tylenol mg 1000 le gafofo 6 ?esia?e me. te ?u atiae be yea??li 2. Esia aw? d? wu. Meg[place] Tylenol g 4 le gafofo 24 me o. [place] ibuprofen mg 3200 gafofo 24 o.

## 2022-01-06 ENCOUNTER — Other Ambulatory Visit: Payer: Self-pay

## 2022-01-06 ENCOUNTER — Other Ambulatory Visit: Payer: Self-pay | Admitting: Critical Care Medicine

## 2022-01-06 MED ORDER — MELOXICAM 15 MG PO TABS
15.0000 mg | ORAL_TABLET | Freq: Every day | ORAL | 0 refills | Status: DC
  Filled 2022-01-06: qty 30, 30d supply, fill #0

## 2022-01-06 MED ORDER — VALSARTAN-HYDROCHLOROTHIAZIDE 80-12.5 MG PO TABS
1.0000 | ORAL_TABLET | Freq: Every day | ORAL | 0 refills | Status: DC
  Filled 2022-01-06: qty 30, 30d supply, fill #0

## 2022-01-07 ENCOUNTER — Other Ambulatory Visit: Payer: Self-pay

## 2022-01-12 NOTE — Progress Notes (Unsigned)
Established Patient Office Visit  Subjective:  Patient ID: Cindy Garrison, female    DOB: 09-21-1970  Age: 51 y.o. MRN: [id number]  CC:  No chief complaint on file.   HPI 01/2021 Louis Matte Belcourt presents for follow up after ED visit and for hypertension follow up. [place] interpretation services were used, with [place], [phone].   The patient visited the ED for the ongoing issue of lower abdominal pain. This is attributed to the uterine leiomyoma She states that the pain has actually improved recently. The pain is bilateral and she notices it on her left and right sides. She admits some vaginal pain that she has noticed when wiping after urinating. She denies any vaginal discharge or bleeding. An MRI was ordered by gynecology (Dr. Elgie Congo) to assess the uterine leiomyoma, with plans for surgery. The patient is unsure of what the MRI is and has not been able to have the imaging done yet. She would appreciate assistance with scheduling this scan. It is vital that this scan be completed.  Patient has diagnosis of schizoaffective disorder, for which she takes haloperidol (Haldol). She has established care with behavioral health, Eulis Canner, NP.  Since her last visit here, she has been able to get a screening mammogram which was negative. She does need a colon cancer screening.   The patient's blood pressure is at therapeutic goal today. She has been confused regarding the dosage of the Valsartan-HCTZ, but should now be taking the higher dose as prescribed last time. She has reported some dizziness since last visit, but denies any palpitations. She has no marked edema.   05/28/2021 Patient seen in return follow-up and this visit was assisted by video interpreter [employer] for [place] [phone] The patient has been seen by gynecology's on several occasions since I last saw her in January.  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  This is the cause of her pelvic pain.  She was begun on  Orilissa to see if this would cause decreased uterine bleeding and facilitate absorption of the hematometra The patient took about a pack and a half of the medication given to her and has not been able to complete she had excess sweating and increased acne on the face and the gluteal area.  She also had other side effects she did not like.  Patient has return visit June 1 with Dr. Kennon Rounds to discuss further options. Patient also has fallen twice at work and on arrival blood pressure is 115/79.  We had recently increased her blood pressure medicine to add valsartan HCT along with amlodipine and I think this may be too much medication for this patient but tickly with the ongoing issues in the uterus.  Patient also has increased allergies and picked up a box of Allegra-D I indicated to her that she should not be taking any allergy medicines with the components.  We can get her prescription version of Zyrtec.  There are no other complaints.  MRI done in February has been reviewed and documented as below  Patient complains of allergic rhinitis worse with the increased pollen were currently experiencing she picked up an [place] from the pharmacy and I told her to stop this because of the D component  7/18 Patient seen in return follow-up this visit was assisted by [place] interpreter Stann Mainland [phone] Patient is in good spirits on arrival states her bleeding has significantly improved as has her pelvic pain on the current dose of norethindrone  On arrival blood pressure  is good 134/70.  Patient is having continued allergies she did not get her cetirizine filled.  She also complains of bilateral upper and lower tooth pain.  There are no other complaints.  Patient states her dizziness and frequent falls have stopped  12/27 saw [facility] 11/11/21  ED 12/15 for foot pain  Saw GYN 10/20 and continues Aygestin for hematometra no surgery needed  1. Allergic rhinitis due to pollen, unspecified seasonality D/c  zyrtec - levocetirizine (XYZAL) 5 MG tablet; Take 1 tablet (5 mg total) by mouth every evening.  Dispense: 90 tablet; Refill: 1   2. Dry skin On ears and feet-currently being treated for tinea pedis and otitis externa but I am wondering if she may have seborrhea or psoriasis - Ambulatory referral to derm - levocetirizine (XYZAL) 5 MG tablet; Take 1 tablet (5 mg total) by mouth every evening.  Dispense: 90 tablet; Refill: 1   3. Vision changes Wants to have prescription safety glasses made - Ambulatory referral to Ophthalmology   4. Edema, unspecified type Patient information given  - Basic metabolic panel - TSH - furosemide (LASIX) 20 MG tablet; Take 1 daily for 3 days as needed for swelling (use sparingly)  Dispense: 30 tablet; Refill: 3   5. Essential hypertension Continue valsartan/HCT - Basic metabolic panel   6. Hospital discharge follow-up     Patient did not want to use french interpreter.  We were unable to get a [place] interpreter on the spot.  I understand minimal french and she had things written down that I was able to read in [place] that is very similar to written french.  All issues addressed in English/written french/[place].    Needs [place]  does not understand [place] well enough Issue  Needs DSS not able to work HTN last bp 151/73   not compliant with BP meds ?understanding?  No to Tdap [place] ?bleeding on aygestin? Amlodipine/ valsartan dose change?    Past Medical History:  Diagnosis Date   #[id number]    Acne    Back pain    Essential hypertension    Seasonal allergies    Syncope 09/16/2014   Syncope and collapse 09/16/2014    Past Surgical History:  Procedure Laterality Date   DILITATION & CURRETTAGE/HYSTROSCOPY WITH NOVASURE ABLATION N/A 12/22/2017   Procedure: DILATATION & CURETTAGE/ HYSTEROSCOPY WITH ENDOMETRIAL FIBROID AND  NOVASURE ABLATION;  Surgeon: Lavonia Drafts, MD;  Location: [facility];  Service: Gynecology;  Laterality: N/A;    Family History   Problem Relation Age of Onset   Breast cancer Sister     Social History   Socioeconomic History   Marital status: Married    Spouse name: Not on file   Number of children: Not on file   Years of education: Not on file   Highest education level: Not on file  Occupational History   Not on file  Tobacco Use   Smoking status: Never   Smokeless tobacco: Never  Vaping Use   Vaping Use: Never used  Substance and Sexual Activity   Alcohol use: Not Currently    Comment: occ.   Drug use: No   Sexual activity: Not on file    Comment: monogamous female partner for 5 years (2010)  Other Topics Concern   Not on file  Social History Narrative   Not on file   Social Determinants of Health   Financial Resource Strain: High Risk (05/28/2021)   Overall Financial Resource Strain (CARDIA)    Difficulty of Paying Living  Expenses: Very hard  Food Insecurity: Food Insecurity Present (05/28/2021)   Hunger Vital Sign    Worried About Running Out of Food in the Last Year: Often true    Ran Out of Food in the Last Year: Often true  Transportation Needs: No Transportation Needs (03/20/2021)   PRAPARE - Hydrologist (Medical): No    Lack of Transportation (Non-Medical): No  Physical Activity: Not on file  Stress: Not on file  Social Connections: Not on file  Intimate Partner Violence: Not on file    Outpatient Medications Prior to Visit  Medication Sig Dispense Refill   acetaminophen (TYLENOL) 500 MG tablet Take 500 mg by mouth every 6 (six) hours as needed for mild pain or fever.     atorvastatin (LIPITOR) 20 MG tablet Take 1 tablet (20 mg total) by mouth daily. 90 tablet 2   clotrimazole (LOTRIMIN) 1 % cream Apply to affected area 2 times daily 30 g 0   furosemide (LASIX) 20 MG tablet Take 1 daily for 3 days as needed for swelling (use sparingly) 30 tablet 3   levocetirizine (XYZAL) 5 MG tablet Take 1 tablet (5 mg total) by mouth every evening. 90 tablet 1    meloxicam (MOBIC) 15 MG tablet Take 1 tablet (15 mg total) by mouth daily. 30 tablet 0   neomycin-polymyxin-hydrocortisone (CORTISPORIN) 3.5-10000-1 OTIC suspension Place 4 drops into both ears 3 (three) times daily. 10 mL 0   norethindrone (AYGESTIN) 5 MG tablet Take 1 tablet (5 mg total) by mouth daily. 90 tablet 2   triamcinolone ointment (KENALOG) 0.1 % SMARTSIG:1 Topical Twice Daily PRN     valsartan-hydrochlorothiazide (DIOVAN HCT) 80-12.5 MG tablet Take 1 tablet by mouth daily. 30 tablet 0   Vitamin D, Ergocalciferol, (DRISDOL) 1.25 MG (50000 UNIT) CAPS capsule Take 1 capsule (50,000 Units total) by mouth every 7 (seven) days on Wednesday 16 capsule 4   No facility-administered medications prior to visit.    Allergies  Allergen Reactions   Other Rash    Blood pressure pill (unknown name)    ROS Review of Systems  Constitutional:  Negative for fatigue.  HENT:  Positive for postnasal drip and rhinorrhea.   Eyes:  Negative for visual disturbance.  Respiratory: Negative.    Cardiovascular: Negative.  Negative for palpitations.  Gastrointestinal: Negative.   Genitourinary:  Negative for dysuria, pelvic pain, vaginal bleeding and vaginal discharge.  Skin:  Negative for rash.       Acne has resolved  Neurological:  Negative for dizziness.  Psychiatric/Behavioral:  Negative for sleep disturbance.       Objective:    Physical Exam Constitutional:      Appearance: Normal appearance.  HENT:     Head: Normocephalic and atraumatic.     Mouth/Throat:     Mouth: Mucous membranes are moist.     Dentition: Normal dentition (dentition fair).     Pharynx: Oropharynx is clear. Uvula midline.  Cardiovascular:     Rate and Rhythm: Normal rate and regular rhythm.     Heart sounds: Normal heart sounds. No murmur heard.    No friction rub. No gallop.  Pulmonary:     Effort: Pulmonary effort is normal.     Breath sounds: Normal breath sounds. No wheezing, rhonchi or rales.  Abdominal:      General: There is no distension.     Tenderness: There is no abdominal tenderness.  Skin:    General: Skin is warm and dry.  Findings: No rash.     Comments: Acneiform rash in both cheeks and lateral to both eyes in the temples has markedly improved  Neurological:     General: No focal deficit present.     Mental Status: She is alert.  Psychiatric:        Mood and Affect: Mood normal.        Behavior: Behavior normal.     LMP 09/12/2017 (LMP Unknown)  Wt Readings from Last 3 Encounters:  11/11/21 141 lb 3.2 oz (64 kg)  08/04/21 152 lb 9.6 oz (69.2 kg)  06/18/21 157 lb (71.2 kg)  MRI 02/2021 FINDINGS: COMBINED FINDINGS FOR BOTH MR ABDOMEN AND PELVIS   Study is significantly limited due to motion, uncooperative patient and lack of contrast.   Lower chest: Not visualized.   Hepatobiliary: Liver appears normal in size and contour with no suspicious mass identified. Gallbladder within normal limits. No biliary ductal dilatation identified.   Pancreas: Grossly normal with no mass or ductal dilatation visualized.   Spleen:  Within normal limits in size and appearance.   Adrenals/Urinary Tract: Adrenal glands appear within normal limits. Mild right hydronephrosis similar to previous CT. No definite suspicious renal mass identified.   Stomach/Bowel: No evidence of bowel obstruction.   Vascular/Lymphatic: No pathologically enlarged lymph nodes identified. No abdominal aortic aneurysm demonstrated.   Reproductive: Uterus measures approximately 11.3 x 16.3 x 24 cm in AP, transverse and craniocaudal dimensions. Several hypointense T2 signal likely uterine fibroids identified measuring up to 4.6 x 2.4 cm on the left. The endometrium is markedly distended with homogeneous hyperintense T1 and hyperintense T2 signal material, likely blood products, measuring approximally 10.6 x 13.7 x 22 cm in AP, transverse and craniocaudal dimensions. Indeterminate heterogeneity of the  cervix measuring approximately 3 x 2.4 cm best seen on the sagittal T2 sequence. Vaginal canal appears within normal limits.   Ovaries are not well visualized. Lobulated multicystic appearance of the bilateral adnexa/ovaries measuring up to 5.3 x 3.8 cm in axial dimensions on the left and 4.7 x 3.7 cm on the right. Associated tubular/cystic components demonstrate hyperintense T1 and T2 signal.   Other:  No ascites visualized.   Musculoskeletal: No suspicious bony lesions identified.   IMPRESSION: 1. Hematometra with marked distension of the endometrial cavity and enlargement of the uterus, suggesting obstruction at the level of the cervix. There is an area of heterogeneity in the cervix which is indeterminate and not well evaluated due to motion/lack of contrast, recommend direct visualization/gynecologic evaluation. 2. Ovaries are not well evaluated. There is evidence of bilateral hematosalpinx and likely simple and hemorrhagic ovarian cysts, left greater than right. 3. No ascites or lymphadenopathy identified. 4. Uterine fibroids. 5. Mild right hydronephrosis similar to previous study, possibly secondary to uterine mass effect on the distal ureter  Health Maintenance Due  Topic Date Due   DTaP/Tdap/Td (3 - Tdap) 11/27/2021     There are no preventive care reminders to display for this patient.  Lab Results  Component Value Date   TSH 1.480 11/11/2021   Lab Results  Component Value Date   WBC 8.9 05/28/2021   HGB 13.4 05/28/2021   HCT 39.6 05/28/2021   MCV 76 (L) 05/28/2021   PLT 234 05/28/2021   Lab Results  Component Value Date   NA 137 11/11/2021   K 4.0 11/11/2021   CO2 23 11/11/2021   GLUCOSE 92 11/11/2021   BUN 7 11/11/2021   CREATININE 0.64 11/11/2021   BILITOT 0.5 05/28/2021  ALKPHOS 59 05/28/2021   AST 25 05/28/2021   ALT 25 05/28/2021   PROT 6.7 05/28/2021   ALBUMIN 4.3 05/28/2021   CALCIUM 9.0 11/11/2021   ANIONGAP 13 01/01/2021   EGFR 106  11/11/2021   Lab Results  Component Value Date   CHOL 164 05/28/2021   Lab Results  Component Value Date   HDL 69 05/28/2021   Lab Results  Component Value Date   LDLCALC 80 05/28/2021   Lab Results  Component Value Date   TRIG 83 05/28/2021   Lab Results  Component Value Date   CHOLHDL 2.4 05/28/2021   Lab Results  Component Value Date   HGBA1C 5.5 01/24/2018      Assessment & Plan:   Problem List Items Addressed This Visit   None No orders of the defined types were placed in this encounter. 40 minutes spent assessing patient high risk for hospitalization history of falls issues with uterus skin conditions medication side effects food access other social determinants of health barriers  Follow-up: No follow-ups on file.    Asencion Noble, MD

## 2022-01-13 ENCOUNTER — Ambulatory Visit: Payer: 59 | Attending: Critical Care Medicine | Admitting: Critical Care Medicine

## 2022-01-13 ENCOUNTER — Ambulatory Visit: Payer: 59 | Admitting: Critical Care Medicine

## 2022-01-13 ENCOUNTER — Encounter: Payer: Self-pay | Admitting: Critical Care Medicine

## 2022-01-13 VITALS — BP 171/63 | HR 79 | Temp 98.4°F | Ht 61.0 in | Wt 128.0 lb

## 2022-01-13 DIAGNOSIS — D500 Iron deficiency anemia secondary to blood loss (chronic): Secondary | ICD-10-CM

## 2022-01-13 DIAGNOSIS — R609 Edema, unspecified: Secondary | ICD-10-CM | POA: Diagnosis not present

## 2022-01-13 DIAGNOSIS — J301 Allergic rhinitis due to pollen: Secondary | ICD-10-CM

## 2022-01-13 DIAGNOSIS — Z789 Other specified health status: Secondary | ICD-10-CM

## 2022-01-13 DIAGNOSIS — E559 Vitamin D deficiency, unspecified: Secondary | ICD-10-CM | POA: Diagnosis not present

## 2022-01-13 DIAGNOSIS — N857 Hematometra: Secondary | ICD-10-CM | POA: Diagnosis not present

## 2022-01-13 DIAGNOSIS — I10 Essential (primary) hypertension: Secondary | ICD-10-CM

## 2022-01-13 DIAGNOSIS — L700 Acne vulgaris: Secondary | ICD-10-CM

## 2022-01-13 DIAGNOSIS — L853 Xerosis cutis: Secondary | ICD-10-CM | POA: Diagnosis not present

## 2022-01-13 DIAGNOSIS — E7800 Pure hypercholesterolemia, unspecified: Secondary | ICD-10-CM

## 2022-01-13 MED ORDER — FUROSEMIDE 20 MG PO TABS: ORAL_TABLET | ORAL | 3 refills | Status: DC

## 2022-01-13 MED ORDER — AMLODIPINE BESYLATE 10 MG PO TABS: 10.0000 mg | ORAL_TABLET | Freq: Every day | ORAL | 2 refills | Status: DC

## 2022-01-13 MED ORDER — CLOBETASOL PROPIONATE 0.05 % EX CREA: 1.0000 | TOPICAL_CREAM | Freq: Two times a day (BID) | CUTANEOUS | 1 refills | Status: DC

## 2022-01-13 MED ORDER — VITAMIN D (ERGOCALCIFEROL) 1.25 MG (50000 UNIT) PO CAPS: 50000.0000 [IU] | ORAL_CAPSULE | ORAL | 4 refills | Status: DC

## 2022-01-13 MED ORDER — LEVOCETIRIZINE DIHYDROCHLORIDE 5 MG PO TABS: 5.0000 mg | ORAL_TABLET | Freq: Every evening | ORAL | 1 refills | Status: DC

## 2022-01-13 MED ORDER — MELOXICAM 15 MG PO TABS: 15.0000 mg | ORAL_TABLET | Freq: Every day | ORAL | 2 refills | Status: AC

## 2022-01-13 MED ORDER — VALSARTAN-HYDROCHLOROTHIAZIDE 320-25 MG PO TABS: 1.0000 | ORAL_TABLET | Freq: Every day | ORAL | 3 refills | Status: DC

## 2022-01-13 MED ORDER — ATORVASTATIN CALCIUM 20 MG PO TABS: 20.0000 mg | ORAL_TABLET | Freq: Every day | ORAL | 2 refills | Status: DC

## 2022-01-13 MED ORDER — NORETHINDRONE ACETATE 5 MG PO TABS: 5.0000 mg | ORAL_TABLET | Freq: Every day | ORAL | 2 refills | Status: DC

## 2022-01-13 NOTE — Patient Instructions (Addendum)
Complete screening labs will be obtained today including iron levels  Start amlodipine 1 pill daily you have been on this before use the furosemide daily if needed for swelling in the ankles on this medication  Increase valsartan HCT to 320 mg/25 1 pill daily for high blood pressure  Take clobetasol cream apply it to hands and feet twice daily for rash  Referral to dermatology will be made again that takes your insurance  Restart  norethindrone 1 daily for bleeding  Return to see Dr. Joya Gaskins 6 weeks for blood pressure follow-up  Woax? dodokp?w?fe blibowo egbea si me iron fe agb?s?s? h le  Dze amlodipine 1 atikekui gbesiagbe si nn? esia zm hafi z furosemide gbesiagbe ne ehi na af?kpodzi fe vuvu le atike sia dzi  Dzi valsartan HCT ?e edzi va ?o 320 mg/25 1 atikekui gbesiagbe na ?us?gb?d?  No clobetasol cream nts?e asisi ?e asiwo kple af? ?u zi eve gbesiagbe hena ?u?udedi  [place]?oe ?e ?utigbal??utinunya me ake si ax? w nugbl?fexe?o?o  Gbugb? dze norethindrone 1 zaz g?me gbesiagbe be ?u le dodom  Tr? yi ?kta Bulmaro Feagans gb? kwasi?a 6 hena ?u fe sisi fe kpl?kpl? ?o

## 2022-01-14 ENCOUNTER — Ambulatory Visit (HOSPITAL_COMMUNITY)
Admission: EM | Admit: 2022-01-14 | Discharge: 2022-01-14 | Disposition: A | Discharge status: Home / Self Care | Payer: 59 | Attending: Family | Admitting: Family

## 2022-01-14 DIAGNOSIS — R69 Illness, unspecified: Secondary | ICD-10-CM | POA: Diagnosis not present

## 2022-01-14 DIAGNOSIS — Z789 Other specified health status: Secondary | ICD-10-CM | POA: Diagnosis not present

## 2022-01-14 DIAGNOSIS — F259 Schizoaffective disorder, unspecified: Secondary | ICD-10-CM | POA: Diagnosis not present

## 2022-01-14 LAB — CBC WITH DIFFERENTIAL/PLATELET
Basophils Absolute: 0 10*3/uL (ref 0.0–0.2)
Basos: 0 %
EOS (ABSOLUTE): 0.2 10*3/uL (ref 0.0–0.4)
Eos: 4 %
Hematocrit: 39.5 % (ref 34.0–46.6)
Hemoglobin: 13.1 g/dL (ref 11.1–15.9)
Immature Grans (Abs): 0 10*3/uL (ref 0.0–0.1)
Immature Granulocytes: 0 %
Lymphocytes Absolute: 2.5 10*3/uL (ref 0.7–3.1)
Lymphs: 49 %
MCH: 26.6 pg (ref 26.6–33.0)
MCHC: 33.2 g/dL (ref 31.5–35.7)
MCV: 80 fL (ref 79–97)
Monocytes Absolute: 0.4 10*3/uL (ref 0.1–0.9)
Monocytes: 9 %
Neutrophils Absolute: 2 10*3/uL (ref 1.4–7.0)
Neutrophils: 38 %
Platelets: 260 10*3/uL (ref 150–450)
RBC: 4.93 x10E6/uL (ref 3.77–5.28)
RDW: 14.4 % (ref 11.7–15.4)
WBC: 5.2 10*3/uL (ref 3.4–10.8)

## 2022-01-14 LAB — BMP8+EGFR
BUN/Creatinine Ratio: 15 (ref 9–23)
BUN: 9 mg/dL (ref 6–24)
CO2: 21 mmol/L (ref 20–29)
Calcium: 9.9 mg/dL (ref 8.7–10.2)
Chloride: 100 mmol/L (ref 96–106)
Creatinine, Ser: 0.62 mg/dL (ref 0.57–1.00)
Glucose: 85 mg/dL (ref 70–99)
Potassium: 3.6 mmol/L (ref 3.5–5.2)
Sodium: 140 mmol/L (ref 134–144)
eGFR: 106 mL/min/{1.73_m2} (ref >59)

## 2022-01-14 LAB — IRON,TIBC AND FERRITIN PANEL
Ferritin: 131 ng/mL (ref 15–150)
Iron Saturation: 23 % (ref 15–55)
Iron: 63 ug/dL (ref 27–159)
Total Iron Binding Capacity: 280 ug/dL (ref 250–450)
UIBC: 217 ug/dL (ref 131–425)

## 2022-01-14 MED ORDER — HALOPERIDOL 1 MG PO TABS: 3.0000 mg | ORAL_TABLET | Freq: Every day | ORAL | 0 refills | Status: DC

## 2022-01-14 MED ORDER — HALOPERIDOL 1 MG PO TABS: 2.0000 mg | ORAL_TABLET | Freq: Every day | ORAL | 0 refills | Status: DC

## 2022-01-14 NOTE — Assessment & Plan Note (Signed)
Is nonadherent to hormonal therapy I gave her a refill on the norethindrone and told her to remain on this and keep her follow-ups with gynecology  She has iron deficiency anemia from this and I told her we will check iron levels and blood counts she may need an iron transfusion

## 2022-01-14 NOTE — Progress Notes (Signed)
   01/14/22 1010  [place] (Walk-ins at [facility] only)  How Did You Hear About [place]? Self  What Is the Reason for Your Visit/Call Today? Pt is here for medications issues related to her physical health. Pt reports she is bleeding on the inside and reports issues with her hands and feet. Pt denies SI, HI, AVH.  Have You Recently Had Any Thoughts About Hurting Yourself? No  Are You Planning to Commit Suicide/Harm Yourself At This time? No  Have you Recently Had Thoughts About [place]? No  Are You Planning To Harm Someone At This Time? No  Are you currently experiencing any auditory, visual or other hallucinations? No  Have You Used Any Alcohol or Drugs in the Past 24 Hours? No  Do you have any current medical co-morbidities that require immediate attention? No  Clinician description of patient physical appearance/behavior: confused  What Do You Feel Would Help You the Most Today? Medication(s)  If access to [facility] was not available, would you have sought care in the Emergency Department? No  Determination of Need Routine (7 days)  Options For Referral Medication Management

## 2022-01-14 NOTE — Progress Notes (Signed)
Let pt know good news she is NOT iron deficient , blood counts are normal other labs normal I would still take the norethindrone

## 2022-01-14 NOTE — ED Notes (Addendum)
Patient has been discharged to home by provider. Patient was given an AVS with resources.

## 2022-01-14 NOTE — Assessment & Plan Note (Signed)
Currently resolved. °

## 2022-01-14 NOTE — Care Management (Signed)
OBS Care Management   Per Beatriz Stallion, NP  patient does not meet criteria for inpatient hospitalization and is medically and psychiatrically cleared.   Writer met with patient and provided her with resources for mental health providers that accept [employer] listed below.  Writer also provided the patient with information about the [facility].   Writer provided the patient with [facility] for free eye exams to low-income patients.

## 2022-01-14 NOTE — ED Notes (Signed)
Patient discharged with written and verbal instructions by Beatriz Stallion, NP.

## 2022-01-14 NOTE — Discharge Instructions (Addendum)

## 2022-01-14 NOTE — Assessment & Plan Note (Signed)
Continue with vitamin-D supplement.

## 2022-01-14 NOTE — ED Provider Notes (Signed)
Behavioral Health Urgent Care Medical Screening Exam  Patient Name: Cindy Garrison MRN: [id number] Date of Evaluation: 01/14/22 Chief Complaint:   Diagnosis:  Final diagnoses:  Schizoaffective disorder, unspecified type [facility])  Language barrier affecting health care, [place] is not well understood    History of Present illness: Cindy Garrison is a 51 y.o. female. Patient presents voluntarily to [facility] behavioral health for walk-in assessment.   Patient is assessed, face-to-face, by nurse practitioner.  Patient request interpreter, prefers Ewe interpreter.  Ewe interpreter not available, patient offered [place] interpreter, she declined.  She is confident she can complete assessment in English, she states "I understand everything okay."  She is seated in assessment area, no acute distress. Consulted with provider, Dr.  Dwyane Dee, and chart reviewed on 01/14/2022. She  is alert and oriented, pleasant and cooperative during assessment.   Cindy Garrison presents today to request refill for her haloperidol.  Her diagnoses is schizoaffective disorder.  She ran out of her medication several months ago.  She did not return to outpatient psychiatry follow-up as she did not like virtual format at [facility].  Discussed potential for inpatient appointments moving forward, she verbalizes agreement with plan.  She denies history of inpatient psychiatric hospitalization.  No family mental health history reported.  Recent stressors include an argument with her husband.  Patient shares that argument initially began because she thought content lunch box.  Patient reports her husband then threatened to leave her, he also threatened to ask her boss to fire her from her employment as he works at the same employer.  Patient reports her husband called her "crazy."  She reports her husband has been verbally unkind by threatening to leave her.  She denies physical, sexual, or financial  abuse.  Additional stressors include primary care follow-up appointment on yesterday.  Patient reports frustration that medications for "bleeding" were not refilled, only cholesterol medications refilled.  Patient verbalizes plan to reach out to primary care provider to discuss and confirm current medication list.  She reports she is compliant with medications as directed by primary care provider.  She is missing "bleeding" medication, she is unable to recall name of this medication, also unable to recall last dose of this medication.  Patient  presents with euthymic mood, congruent affect. She  denies suicidal and homicidal ideations. Denies history of suicide attempts, denies history of non suicidal self-harm behavior.  Patient easily  contracts verbally for safety with this Probation officer.    Patient has normal speech and behavior.  She  denies auditory and visual hallucinations.  Patient is able to converse coherently with goal-directed thoughts and no distractibility or preoccupation.  Denies symptoms of paranoia.  Objectively there is no evidence of psychosis/mania or delusional thinking.  Maryela resides in [place] with her husband.  She denies access to weapons.  She is employed in [employer]. She denies alcohol and substance use. Patient endorses average sleep and appetite.  Patient offered support and encouragement.  She gives verbal consent to speak with her husband, Cindy Garrison phone number [phone].  Attempted to call husband, phone number not connected.    Patient also gives verbal consent to speak with her brother, Cindy Garrison phone number [phone].  Spoke with patient's brother who denies safety concerns.  He we will follow-up with patient at her home later today to "talk about everything."   Patient and family are educated and verbalize understanding of mental health resources and other crisis services in the community.  They are instructed to call 911  and present to the nearest emergency room should patient experience any suicidal/homicidal ideation, auditory/visual/hallucinations, or detrimental worsening of mental health condition.      [place] ED from 01/01/2022 in [place] ED from 11/10/2021 in [facility] ED from 10/28/2021 in [facility] [facility] [place] No Risk No Risk No Risk       Psychiatric Specialty Exam  Presentation  General Appearance:Appropriate for Environment; Casual  Eye Contact:Good  Speech:Clear and Coherent; Normal Rate  Speech Volume:Normal  Handedness:Right   Mood and Affect  Mood: Euthymic  Affect: Appropriate; Congruent   Thought Process  Thought Processes: Coherent; Goal Directed; Linear  Descriptions of Associations:Intact  Orientation:Full (Time, Place and Person)  Thought Content:Logical    Hallucinations:None  Ideas of Reference:None  Suicidal Thoughts:No  Homicidal Thoughts:No   Sensorium  Memory: Immediate Good; Recent Good  Judgment: Fair  Insight: Fair   Community education officer  Concentration: Good  Attention Span: Good  Recall: Good  Fund of Knowledge: Good  Language: Good   Psychomotor Activity  Psychomotor Activity: Normal   Assets  Assets: Communication Skills; Financial Resources/Insurance; Desire for Improvement; Housing; Leisure Time; Intimacy; Physical Health; Resilience; Social Support   Sleep  Sleep: Good  Number of hours: No data recorded  No data recorded  Physical Exam: Physical Exam Vitals and nursing note reviewed.  Constitutional:      Appearance: Normal appearance. She is well-developed.  HENT:     Head: Normocephalic and atraumatic.     Nose: Nose normal.  Cardiovascular:     Rate and Rhythm: Normal rate.  Pulmonary:     Effort: Pulmonary effort is normal.  Musculoskeletal:        General: Normal range  of motion.     Cervical back: Normal range of motion.  Skin:    General: Skin is warm and dry.  Neurological:     Mental Status: She is alert and oriented to person, place, and time.  Psychiatric:        Attention and Perception: Attention and perception normal.        Mood and Affect: Mood and affect normal.        Speech: Speech normal.        Behavior: Behavior normal. Behavior is cooperative.        Thought Content: Thought content normal.        Cognition and Memory: Cognition normal.    Review of Systems  Constitutional: Negative.   HENT: Negative.    Eyes: Negative.   Respiratory: Negative.    Cardiovascular: Negative.   Gastrointestinal: Negative.   Genitourinary: Negative.   Musculoskeletal: Negative.   Skin: Negative.   Neurological: Negative.   Psychiatric/Behavioral: Negative.     Blood pressure (!) 155/90, pulse 100, temperature 98 F (36.7 C), temperature source Oral, resp. rate 18, last menstrual period 09/12/2017, SpO2 100 %. There is no height or weight on file to calculate BMI.  Musculoskeletal: Strength & Muscle Tone: within normal limits Gait & Station: normal Patient leans: N/A   [facility] MSE Discharge Disposition for Follow up and Recommendations: Patient initially requested Haloperidol 3 mg nightly to be refilled.  Medication refill electronically provided to patient's pharmacy.  Patient then asked that haloperidol be decreased to 2 mg nightly as 3 mg nightly as "makes me too sleepy."  Called pharmacy of record to discontinue 3 mg prescription, 2 mg prescription  sent [facility] [place].  Based on my evaluation the patient does not appear to have an emergency medical condition and can be discharged with resources and follow up care in outpatient services for Medication Management and Individual Therapy Follow-up with outpatient psychiatry for medication management and individual counseling. Medication: -Haloperidol 2  mg nightly/mood  Follow-up with established primary care provider.  Lucky Rathke, FNP 01/14/2022, 10:57 AM

## 2022-01-14 NOTE — Assessment & Plan Note (Signed)
Recheck iron levels

## 2022-01-14 NOTE — Assessment & Plan Note (Signed)
Patient only speaks minimal English her language barrier has been an impact to healthcare access for this patient  prefers ewe

## 2022-01-14 NOTE — Assessment & Plan Note (Signed)
Medication nonadherence she states amlodipine caused swelling she has furosemide for this  Patient to increase dose of valsartan HCT and resume amlodipine daily  Return for short-term blood pressure follow-up

## 2022-01-22 ENCOUNTER — Encounter (HOSPITAL_COMMUNITY): Payer: Self-pay | Admitting: Physician Assistant

## 2022-01-22 ENCOUNTER — Ambulatory Visit (INDEPENDENT_AMBULATORY_CARE_PROVIDER_SITE_OTHER): Payer: 59 | Admitting: Physician Assistant

## 2022-01-22 ENCOUNTER — Telehealth: Payer: Self-pay

## 2022-01-22 DIAGNOSIS — R69 Illness, unspecified: Secondary | ICD-10-CM | POA: Diagnosis not present

## 2022-01-22 DIAGNOSIS — F259 Schizoaffective disorder, unspecified: Secondary | ICD-10-CM

## 2022-01-22 MED ORDER — HALOPERIDOL 1 MG PO TABS: 1.0000 mg | ORAL_TABLET | Freq: Every day | ORAL | 1 refills | Status: DC

## 2022-01-22 NOTE — Telephone Encounter (Signed)
-----   Message from Elsie Stain, MD sent at 01/14/2022  6:08 AM EST ----- Let pt know good news she is NOT iron deficient , blood counts are normal other labs normal I would still take the norethindrone

## 2022-01-22 NOTE — Progress Notes (Signed)
BH MD/PA/NP OP Progress Note  01/22/2022 2:35 PM SEE BEHARRY  MRN:  [id number]  Chief Complaint:  Chief Complaint  Patient presents with   Follow-up   Medication Refill   HPI:   Cindy Garrison is a 52 year old, African-American female with a past psychiatric history significant for schizoaffective disorder (unspecified type) who presents to [facility] for follow-up and medication management.  Patient was last seen by Franne Grip, NP on 05/07/2021.  Due to patient speaking another language, a [place] interpreter was utilized during the duration of the encounter.  During her last encounter, patient was being managed on the following medications:  Haldol 3 mg daily Trazodone 50 mg (0.5 to 1 tablet) at bedtime  Patient was recently seen at [facility] on 01/14/2022 by Beatriz Stallion, NP.  During her assessment, patient presented to this facility requesting refills on her haloperidol for medications several months ago.  She had also reported that her main stressor included arguments with her husband.  She reported that her husband was verbally unkind to her and threatened to leave her.  Prior to discharge, patient's haloperidol was refilled 2 mg at bedtime for mood.  This medication was decreased from 3 mg due to patient complaining of the medication making her too sleepy at 3 mg at bedtime.  Today, patient denies issues with her current medication regimen.  Patient reports that she [facility]  Patient denies any major concerns today.  Patient did admit to experiencing random movements in her mouth.  Provider informed patient that the random movements that she experiences in her mouth may be due to her haloperidol.  Patient denied that the medication was the cause of her mouth spasms stating that when her blood pressure is out of control, then she experiences mouth spasms.   She denies depression or anxiety and further denies any other issues regarding her mental health.  Prior to the conclusion of the encounter, patient asked provider if she could say a prayer before she exited the encounter.  Provider agreed for patient to say her prayer before concluding the encounter.  Patient is alert and oriented x 4, calm, cooperative, and fully engaged in conversation during the encounter.  Patient endorses good mood.  Patient denies suicidal or homicidal ideations.  She further denies auditory or visual hallucinations and does not appear to be responding to internal/external stimuli.  Patient states that she receives between 3 and 4 hours of sleep each night.  Patient reports that she receives enough sleep during the night.  Patient endorses good appetite.  Patient denies alcohol consumption, tobacco use, and illicit drug use.  Visit Diagnosis:    ICD-10-CM   1. Schizoaffective disorder, unspecified type ([place])  F25.9 haloperidol (HALDOL) 1 MG tablet      Past Psychiatric History:  Schizoaffective disorder.  Patient has been on Haldol for 10 years.  Prior to establishing care with [facility], patient was being seen at [facility]  Past Medical History:  Past Medical History:  Diagnosis Date   #[id number]    Acne    Back pain    Essential hypertension    Seasonal allergies    Syncope 09/16/2014   Syncope and collapse 09/16/2014    Past Surgical History:  Procedure Laterality Date   DILITATION & CURRETTAGE/HYSTROSCOPY WITH NOVASURE ABLATION N/A 12/22/2017   Procedure: DILATATION & CURETTAGE/ HYSTEROSCOPY WITH ENDOMETRIAL FIBROID AND  NOVASURE ABLATION;  Surgeon: Lavonia Drafts, MD;  Location: [facility];  Service: Gynecology;  Laterality: N/A;    Family Psychiatric History:  Patient denies.  Family History:  Family History  Problem Relation Age of Onset   Breast cancer Sister     Social History:  Social History   Socioeconomic History    Marital status: Married    Spouse name: Not on file   Number of children: Not on file   Years of education: Not on file   Highest education level: Not on file  Occupational History   Not on file  Tobacco Use   Smoking status: Never   Smokeless tobacco: Never  Vaping Use   Vaping Use: Never used  Substance and Sexual Activity   Alcohol use: Not Currently    Comment: occ.   Drug use: No   Sexual activity: Not on file    Comment: monogamous female partner for 5 years (2010)  Other Topics Concern   Not on file  Social History Narrative   Not on file   Social Determinants of Health   Financial Resource Strain: High Risk (05/28/2021)   Overall Financial Resource Strain (CARDIA)    Difficulty of Paying Living Expenses: Very hard  Food Insecurity: Food Insecurity Present (05/28/2021)   Hunger Vital Sign    Worried About Running Out of Food in the Last Year: Often true    Ran Out of Food in the Last Year: Often true  Transportation Needs: No Transportation Needs (03/20/2021)   PRAPARE - Hydrologist (Medical): No    Lack of Transportation (Non-Medical): No  Physical Activity: Not on file  Stress: Not on file  Social Connections: Not on file    Allergies:  Allergies  Allergen Reactions   Other Rash    Blood pressure pill (unknown name)    Metabolic Disorder Labs: Lab Results  Component Value Date   HGBA1C 5.5 01/24/2018   No results found for: "PROLACTIN" Lab Results  Component Value Date   CHOL 164 05/28/2021   TRIG 83 05/28/2021   HDL 69 05/28/2021   CHOLHDL 2.4 05/28/2021   LDLCALC 80 05/28/2021   LDLCALC 90 01/23/2020   Lab Results  Component Value Date   TSH 1.480 11/11/2021   TSH 1.260 06/20/2019    Therapeutic Level Labs: No results found for: "LITHIUM" No results found for: "VALPROATE" No results found for: "CBMZ"  Current Medications: Current Outpatient Medications  Medication Sig Dispense Refill   acetaminophen  (TYLENOL) 500 MG tablet Take 500 mg by mouth every 6 (six) hours as needed for mild pain or fever.     amLODipine (NORVASC) 10 MG tablet Take 1 tablet (10 mg total) by mouth daily. 90 tablet 2   atorvastatin (LIPITOR) 20 MG tablet Take 1 tablet (20 mg total) by mouth daily. 90 tablet 2   clobetasol cream (TEMOVATE) 9.51 % Apply 1 Application topically 2 (two) times daily. 60 g 1   furosemide (LASIX) 20 MG tablet Take 1 daily as needed for swelling 30 tablet 3   haloperidol (HALDOL) 1 MG tablet Take 1 tablet (1 mg total) by mouth at bedtime. 30 tablet 1   levocetirizine (XYZAL) 5 MG tablet Take 1 tablet (5 mg total) by mouth every evening. 90 tablet 1   meloxicam (MOBIC) 15 MG tablet Take 1 tablet (15 mg total) by mouth daily. 60 tablet 2   norethindrone (AYGESTIN) 5 MG tablet Take 1 tablet (5 mg total) by mouth daily. Merom  tablet 2   valsartan-hydrochlorothiazide (DIOVAN-HCT) 320-25 MG tablet Take 1 tablet by mouth daily. 90 tablet 3   Vitamin D, Ergocalciferol, (DRISDOL) 1.25 MG (50000 UNIT) CAPS capsule Take 1 capsule (50,000 Units total) by mouth every 7 (seven) days on Wednesday 16 capsule 4   No current facility-administered medications for this visit.     Musculoskeletal: Strength & Muscle Tone: within normal limits Gait & Station: normal Patient leans: N/A  Psychiatric Specialty Exam: Review of Systems  Psychiatric/Behavioral:  Negative for decreased concentration, dysphoric mood, hallucinations, self-injury, sleep disturbance and suicidal ideas. The patient is not nervous/anxious and is not hyperactive.     Last menstrual period 09/12/2017.There is no height or weight on file to calculate BMI.  General Appearance: Casual  Eye Contact:  Fair  Speech:  Clear and Coherent and Normal Rate  Volume:  Normal  Mood:  Euthymic  Affect:  Congruent  Thought Process:  Coherent, Goal Directed, and Descriptions of Associations: Intact  Orientation:  Full (Time, Place, and Person)  Thought  Content: WDL   Suicidal Thoughts:  No  Homicidal Thoughts:  No  Memory:  Immediate;   Good Recent;   Good Remote;   Good  Judgement:  Good  Insight:  Good  Psychomotor Activity:  Normal  Concentration:  Concentration: Good and Attention Span: Good  Recall:  Good  Fund of Knowledge: Good  Language: Good  Akathisia:  No  Handed:  Right  AIMS (if indicated): not done  Assets:  Communication Skills Desire for Improvement Financial Resources/Insurance Housing Intimacy Social Support  ADL's:  Intact  Cognition: WNL  Sleep:  Fair   Screenings: AIMS    Flowsheet Row Clinical Support from 01/22/2022 in [facility] Office Visit from 04/01/2020 in [place] Total Score 2 0      GAD-7    Flowsheet Row Clinical Support from 01/22/2022 in [facility] Office Visit from 01/13/2022 in [place] Office Visit from 03/20/2021 in Center for [place] [facility] Clinical Support from 01/29/2021 in [place] from 07/30/2020 in [facility]  Total GAD-7 Score '1 3 8 18 '$ 0      PHQ2-9    Flowsheet Row Clinical Support from 01/22/2022 in [facility] Office Visit from 01/13/2022 in [place] Office Visit from 05/28/2021 in [place] Office Visit from 03/20/2021 in Center for [place] [facility] Clinical Support from 01/29/2021 in [place]  PHQ-2 Total Score 0 '3 3 2 6  '$ PHQ-9 Total Score -- 3 -- 2 18      Flowsheet Row Clinical Support from 01/22/2022 in [facility] ED from 01/01/2022 in [place] ED from 11/10/2021 in [place] No Risk No Risk No Risk        Assessment and Plan:   [name] is a 52 year old, African-American female with a past psychiatric history significant for schizoaffective disorder (unspecified type) who presents to [facility] for follow-up and medication management.  Patient was last seen by Franne Grip, NP on 05/07/2021.  Due to patient speaking another language, a [place] interpreter was utilized during the duration of the encounter.  Patient reports  no issues or concerns regarding her current medication regimen.  Patient was recently seen at [facility] for medication refill. Patient informed provider that she is currently taking Haldol 1 mg at bedtime and feels that the dosage is effective in managing her symptoms.  She denies anxiety or depression.  She further denies auditory or visual hallucinations and does not appear to be responding to internal/external stimuli.  Patient did endorse random spasms in her mouth but stated that her spasms only occurred when her blood pressure was out of control.  Patient to continue taking her haloperidol at 1 mg at bedtime for the management of her schizoaffective disorder.  Patient's medication to be e-prescribed to pharmacy of choice.  Collaboration of Care: Collaboration of Care: Medication Management AEB patient's psychiatric medications being managed by a mental health provider and Psychiatrist AEB patient being followed by a psychiatric provider  Patient/Guardian was advised Release of Information must be obtained prior to any record release in order to collaborate their care with an outside provider. Patient/Guardian was advised if they have not already done so to contact the registration department to sign all necessary forms in order for [place] to release information regarding their care.   Consent: Patient/Guardian gives verbal consent for treatment and assignment of benefits for services  provided during this visit. Patient/Guardian expressed understanding and agreed to proceed.   1. Schizoaffective disorder, unspecified type ([place])  - haloperidol (HALDOL) 1 MG tablet; Take 1 tablet (1 mg total) by mouth at bedtime.  Dispense: 30 tablet; Refill: 1  Patient to follow up in 6 weeks Provider spent a total of 47 minutes with the patient/reviewing the patient's chart  Malachy Mood, PA 01/22/2022, 2:35 PM

## 2022-01-22 NOTE — Telephone Encounter (Signed)
Pt was called and vm was left, Information has been sent to nurse pool.   Interpreter id #[id number]

## 2022-02-03 ENCOUNTER — Telehealth: Payer: Self-pay | Admitting: Lactation Services

## 2022-02-03 NOTE — Telephone Encounter (Signed)
Patient arrived in office with questions about Megace and Aygestin. Patient declined interpreter. She had bottles of both with her. Megace last filled in 02/2020, reviewed these are considered expired ans should be discarded.   Per cart review Megace was prescribed in 2022. In October 2023 she was prescribed Aygestin in office. Her Internal Medicine office then re ordered it for her in December 2023.   Reviewed with patient that she was prescribed the Aygestin most recently and the Internal Medicine doctor wrote her for refills. Reviewed currently she is to be on the Aygestin daily.   Patient voiced understanding.

## 2022-02-04 ENCOUNTER — Telehealth: Payer: Self-pay

## 2022-02-04 ENCOUNTER — Ambulatory Visit (HOSPITAL_COMMUNITY)
Admission: EM | Admit: 2022-02-04 | Discharge: 2022-02-04 | Disposition: A | Discharge status: Home / Self Care | Payer: 59

## 2022-02-04 DIAGNOSIS — Z760 Encounter for issue of repeat prescription: Secondary | ICD-10-CM

## 2022-02-04 DIAGNOSIS — R450 Nervousness: Secondary | ICD-10-CM | POA: Diagnosis not present

## 2022-02-04 DIAGNOSIS — R4589 Other symptoms and signs involving emotional state: Secondary | ICD-10-CM

## 2022-02-04 NOTE — Progress Notes (Signed)
   02/04/22 1814  [place] (Walk-ins at [facility] only)  How Did You Hear About [place]? Self  What Is the Reason for Your Visit/Call Today? ROUTINE: Albie Bazin is a 52 year old, African-American female with a past psychiatric history significant for schizoaffective disorder (unspecified type) who presents to  [facility]. Patient does speak another language, she was offered a  [place] interpreter. However, declined use of an interpreter, she does speak [place]. During her triage, patient says that she is here to request refills on her medications. She is employed but does not have money to purchase her medications. She is hoping to get refills her at the [facility], for free. She does not know the names of any of her medications stating, "The bottles are in my belongings, you will have to look". Patient's belongings are currently locked up by security. When asked  what are the medications utilized for she replies, "Cholesterol, hand swelling, and foot swelling". She denies that she is prescribed any psychiatric medications or has a mental health diagnosis. However, a chart review indicates otherwise. Denies SI and HI. She mentions a chronic history of auditory hallucinations that tell her to "Money gram", "You will get fired", "[employer] money to me". Onset of hallucinations started at the age of 52 yrs old. Denies SI and HI. Receives outpatient services at [facility].  How Long Has This Been Causing You Problems? 1 wk - 1 month  Have You Recently Had Any Thoughts About Hurting Yourself? No  Are You Planning to Commit Suicide/Harm Yourself At This time? No  Have you Recently Had Thoughts About [place]? No  Are You Planning To Harm Someone At This Time? No  Are you currently experiencing any auditory, visual or other hallucinations? No  Have You Used Any Alcohol or Drugs in the Past 24 Hours? No  Do you have any current medical co-morbidities that require immediate attention? No   Clinician description of patient physical appearance/behavior: Calm and cooperative.  What Do You Feel Would Help You the Most Today? Medication(s)  If access to [facility] was not available, would you have sought care in the Emergency Department? No  Determination of Need Routine (7 days)  Options For Referral Medication Management;Other: Comment (Financial assistance for medication management.)

## 2022-02-04 NOTE — BH Assessment (Signed)
LCSW Progress Note   Per EDP, this pt does not require psychiatric hospitalization at this time.  Pt is psychiatrically cleared.  Discharge instructions include a reminder to maintain her appointments for medication management at [facility] along with program that could help reduce the cost of her prescriptions.  EDP has been notified.  Omelia Blackwater, MSW, [place] [phone] or [phone]

## 2022-02-04 NOTE — ED Provider Notes (Signed)
[facility] Medical Screening Exam  Patient Name: Cindy Garrison MRN: [id number] Date of Evaluation: 02/05/22 Chief Complaint:   Diagnosis:  Final diagnoses:  Encounter for medication refill  Anxious appearance    History of Present illness: Cindy Garrison is a 52 y.o. female. With a history of schizoaffective disorder, presented to [facility] voluntarily seeking medication refill.  Per the patient she already have a refill but she needs the medicine for free patient states she has money but the money is to buy food so she needs [place] to give her the medicine for free.  Discussed with patient that we do not have access to free medications.  Per the patient she needs her cholesterol medicine and her water pill, and medications for her dry palm.  Discussed with patient that she should talk with her PCP.  Patient does have insurance so I encouraged patient to reach out to her insurance to see what they cover under her plan.   Face-to-face observation of patient, patient is alert and oriented x 4, patient speaks fluent English so no interpreter was used.  Patient denies SI, HI, AVH or paranoia at this time.  Patient denies alcohol use, smoking or illicit drug use.  Recommend discharge for patient to follow-up primary care and her insurance.  [place] ED from 02/04/2022 in [place] from 01/22/2022 in [facility] ED from 01/01/2022 in [place] No Risk No Risk No Risk       Psychiatric Specialty Exam  Presentation  General Appearance:Casual  Eye Contact:Good  Speech:Clear and Coherent  Speech Volume:Normal  Handedness:Right   Mood and Affect  Mood: Anxious  Affect: Appropriate   Thought Process  Thought Processes: Coherent  Descriptions of Associations:Intact  Orientation:Full (Time, Place and Person)  Thought  Content:WDL    Hallucinations:None  Ideas of Reference:None  Suicidal Thoughts:No  Homicidal Thoughts:No   Sensorium  Memory: Immediate Good  Judgment: Fair  Insight: Fair   Community education officer  Concentration: Fair  Attention Span: Fair  Recall: [employer] of Knowledge: Fair  Language: Fair   Psychomotor Activity  Psychomotor Activity: Normal   Assets  Assets: Communication Skills   Sleep  Sleep: Good  Number of hours:  8   No data recorded  Physical Exam: Physical Exam HENT:     Head: Normocephalic.     Nose: Nose normal.  Cardiovascular:     Rate and Rhythm: Normal rate.  Pulmonary:     Effort: Pulmonary effort is normal.  Musculoskeletal:        General: Normal range of motion.     Cervical back: Normal range of motion.  Neurological:     General: No focal deficit present.     Mental Status: She is alert.  Psychiatric:        Mood and Affect: Mood normal.        Behavior: Behavior normal.        Thought Content: Thought content normal.        Judgment: Judgment normal.    Review of Systems  Constitutional: Negative.   HENT: Negative.    Eyes: Negative.   Respiratory: Negative.    Cardiovascular: Negative.   Gastrointestinal: Negative.   Genitourinary: Negative.   Musculoskeletal: Negative.   Skin: Negative.   Neurological: Negative.   Endo/Heme/Allergies: Negative.   Psychiatric/Behavioral:  The patient is nervous/anxious.    Blood pressure  132/63, pulse 82, temperature 97.9 F (36.6 C), temperature source Oral, resp. rate 16, height '4\' 9"'$  (1.448 m), weight 112 lb (50.8 kg), last menstrual period 09/12/2017, SpO2 100 %. Body mass index is 24.24 kg/m.  Musculoskeletal: Strength & Muscle Tone: within normal limits Gait & Station: normal Patient leans: N/A   [place] MSE Discharge Disposition for Follow up and Recommendations: Based on my evaluation the patient does not appear to have an emergency medical condition  and can be discharged with resources and follow up care in outpatient services for Medication Management   Evette Georges, NP 02/05/2022, 5:23 AM

## 2022-02-04 NOTE — Telephone Encounter (Signed)
Patient in the office today with questions about her medications and to confirm her up coming appointment.  Offered patient interpreter , patient declined interpreter. All medications  and  indication for use reviewed with patient . Patient was taking hydrochlorothiazide 25 mg daily. Patient instructed to stop this medication per medication record medication has been discontinued. Called [facility] to confirm medication patient had listed there were the same as the ones we had in medication record. They were confirmed. Informed by pharmacy that patient has 5 medications there for pick-up. Patient informed that she had medication refills waiting for here to pick at the pharmacy. Patient voiced she was going to pick them up today . Appointment for 03/23/22 at 8:30 am confirmed with patient and written appointment time and date given to patient. Patient denies any other question and voiced understanding of all discussed .

## 2022-02-10 ENCOUNTER — Ambulatory Visit: Payer: 59 | Admitting: Physician Assistant

## 2022-02-22 ENCOUNTER — Other Ambulatory Visit: Payer: Self-pay

## 2022-02-22 ENCOUNTER — Encounter (HOSPITAL_COMMUNITY): Payer: Self-pay | Admitting: Emergency Medicine

## 2022-02-22 ENCOUNTER — Emergency Department (HOSPITAL_COMMUNITY)
Admission: EM | Admit: 2022-02-22 | Discharge: 2022-02-22 | Disposition: A | Discharge status: Home / Self Care | Payer: 59 | Attending: Emergency Medicine | Admitting: Emergency Medicine

## 2022-02-22 DIAGNOSIS — T465X5A Adverse effect of other antihypertensive drugs, initial encounter: Secondary | ICD-10-CM | POA: Insufficient documentation

## 2022-02-22 DIAGNOSIS — M7989 Other specified soft tissue disorders: Secondary | ICD-10-CM | POA: Insufficient documentation

## 2022-02-22 DIAGNOSIS — T461X5A Adverse effect of calcium-channel blockers, initial encounter: Secondary | ICD-10-CM | POA: Diagnosis not present

## 2022-02-22 DIAGNOSIS — Z79899 Other long term (current) drug therapy: Secondary | ICD-10-CM | POA: Diagnosis not present

## 2022-02-22 DIAGNOSIS — T50905A Adverse effect of unspecified drugs, medicaments and biological substances, initial encounter: Secondary | ICD-10-CM

## 2022-02-22 NOTE — ED Triage Notes (Addendum)
Pt c/o bilateral feet swelling x3 weeks. Denies injury. Denies pain. Pt ambulatory during triage.

## 2022-02-22 NOTE — ED Provider Notes (Signed)
[facility] Provider Note   CSN: [id number] Arrival date & time: 02/22/22  0009     History  Chief Complaint  Patient presents with   Leg Swelling    Cindy Garrison is a 52 y.o. female.  The history is provided by the patient.  Illness Location:  B feet Quality:  Swelling Severity:  Mild Onset quality:  Gradual Duration:  3 weeks Timing:  Constant Progression:  Unchanged Chronicity:  Recurrent Context:  Is on lasix but started amlopidine for HTN and then symptoms started Relieved by:  Nothing Worsened by:  Nothing Ineffective treatments:  Lasix Associated symptoms: no chest pain, no fever, no shortness of breath and no wheezing        Home Medications Prior to Admission medications   Medication Sig Start Date End Date Taking? Authorizing Provider  acetaminophen (TYLENOL) 500 MG tablet Take 500 mg by mouth every 6 (six) hours as needed for mild pain or fever.    [provider]  amLODipine (NORVASC) 10 MG tablet Take 1 tablet (10 mg total) by mouth daily. 01/13/22   Elsie Stain, MD  atorvastatin (LIPITOR) 20 MG tablet Take 1 tablet (20 mg total) by mouth daily. 01/13/22 01/13/23  Elsie Stain, MD  clobetasol cream (TEMOVATE) 2.09 % Apply 1 Application topically 2 (two) times daily. 01/13/22   Elsie Stain, MD  furosemide (LASIX) 20 MG tablet Take 1 daily as needed for swelling 01/13/22   Elsie Stain, MD  haloperidol (HALDOL) 1 MG tablet Take 1 tablet (1 mg total) by mouth at bedtime. 01/22/22 01/22/23  Nwoko, Terese Door, PA  levocetirizine (XYZAL) 5 MG tablet Take 1 tablet (5 mg total) by mouth every evening. 01/13/22   Elsie Stain, MD  norethindrone (AYGESTIN) 5 MG tablet Take 1 tablet (5 mg total) by mouth daily. 01/13/22   Elsie Stain, MD  valsartan-hydrochlorothiazide (DIOVAN-HCT) 320-25 MG tablet Take 1 tablet by mouth daily. 01/13/22   Elsie Stain, MD  Vitamin D,  Ergocalciferol, (DRISDOL) 1.25 MG (50000 UNIT) CAPS capsule Take 1 capsule (50,000 Units total) by mouth every 7 (seven) days on Wednesday 01/13/22 01/13/23  Elsie Stain, MD  hydrochlorothiazide (HYDRODIURIL) 25 MG tablet Take 1 tablet (25 mg total) by mouth daily. 09/20/19 01/23/20  Charlott Rakes, MD  lisinopril (ZESTRIL) 40 MG tablet TAKE 1 TABLET (40 MG TOTAL) BY MOUTH DAILY. TO LOWER BLOOD PRESSURE 01/08/20 01/23/20  Charlott Rakes, MD  omeprazole (PRILOSEC) 40 MG capsule Take 1 capsule (40 mg total) by mouth 2 (two) times daily before a meal. 12/28/18 12/29/18  Antony Blackbird, MD      Allergies    Other    Review of Systems   Review of Systems  Constitutional:  Negative for fever.  HENT:  Negative for facial swelling.   Eyes:  Negative for redness.  Respiratory:  Negative for shortness of breath and wheezing.   Cardiovascular:  Negative for chest pain and palpitations.  All other systems reviewed and are negative.   Physical Exam Updated Vital Signs BP (!) 153/88   Pulse (!) 106   Temp 98.3 F (36.8 C) (Oral)   Resp 18   Ht '4\' 9"'$  (1.448 m)   Wt 50.8 kg   LMP 09/12/2017 (LMP Unknown)   SpO2 100%   BMI 24.24 kg/m  Physical Exam Constitutional:      General: She is not in acute distress.    Appearance: Normal appearance.  She is well-developed.  HENT:     Head: Normocephalic and atraumatic.     Nose: Nose normal.  Eyes:     Pupils: Pupils are equal, round, and reactive to light.  Cardiovascular:     Rate and Rhythm: Normal rate and regular rhythm.     Pulses: Normal pulses.     Heart sounds: Normal heart sounds.  Pulmonary:     Effort: Pulmonary effort is normal. No respiratory distress.     Breath sounds: Normal breath sounds. No wheezing or rales.  Abdominal:     General: Bowel sounds are normal. There is no distension.     Palpations: Abdomen is soft.     Tenderness: There is no abdominal tenderness. There is no guarding or rebound.  Genitourinary:    Vagina:  No vaginal discharge.  Musculoskeletal:        General: Normal range of motion.     Cervical back: Normal range of motion and neck supple.     Comments: Trace non-pitting swelling of the forefoot where in the straps of the flip flops she is wearing touch the foot.  No ankle swelling  Skin:    General: Skin is warm and dry.     Capillary Refill: Capillary refill takes less than 2 seconds.     Findings: No erythema or rash.  Neurological:     General: No focal deficit present.     Mental Status: She is alert and oriented to person, place, and time.     Deep Tendon Reflexes: Reflexes normal.  Psychiatric:        Mood and Affect: Mood normal.        Behavior: Behavior normal.     ED Results / Procedures / Treatments   Labs (all labs ordered are listed, but only abnormal results are displayed) Labs Reviewed - No data to display  EKG None  Radiology No results found.  Procedures Procedures    Medications Ordered in ED Medications - No data to display  ED Course/ Medical Decision Making/ A&P                             Medical Decision Making Foot swelling not responsive to lasix since starting Norvasc   Amount and/or Complexity of Data Reviewed External Data Reviewed: labs and notes.    Details: Previous notes and labs in epic reviewed   Risk Risk Details: Patient is very well appearing.  She is not tachycardiac on exam.  She is not have chest pain or shortness of breath.  No travel.  No calf tenderness or leg tenderness. I am struggling to find any swelling in her feet but her symptoms coincide with starting amlodipine.  I suspect this is the issue and is a known side effect.  Start compression stockings.  Ice and elevate the legs when not on them and call your doctor to discuss this medication.      Final Clinical Impression(s) / ED Diagnoses Final diagnoses:  Medication side effect, initial encounter   Return for intractable cough, coughing up blood, fevers >  100.4 unrelieved by medication, shortness of breath, intractable vomiting, chest pain, shortness of breath, weakness, numbness, changes in speech, facial asymmetry, abdominal pain, passing out, Inability to tolerate liquids or food, cough, altered mental status or any concerns. No signs of systemic illness or infection. The patient is nontoxic-appearing on exam and vital signs are within normal limits.  I have reviewed  the triage vital signs and the nursing notes. Pertinent labs & imaging results that were available during my care of the patient were reviewed by me and considered in my medical decision making (see chart for details). After history, exam, and medical workup I feel the patient has been appropriately medically screened and is safe for discharge home. Pertinent diagnoses were discussed with the patient. Patient was given return precautions. Rx / DC Orders ED Discharge Orders          Ordered    Compression stockings        02/22/22 0045              Tamarion Haymond, MD 02/22/22 0141

## 2022-03-05 ENCOUNTER — Encounter (HOSPITAL_COMMUNITY): Payer: 59 | Admitting: Physician Assistant

## 2022-03-19 ENCOUNTER — Ambulatory Visit (INDEPENDENT_AMBULATORY_CARE_PROVIDER_SITE_OTHER): Payer: 59 | Admitting: Physician Assistant

## 2022-03-19 DIAGNOSIS — F259 Schizoaffective disorder, unspecified: Secondary | ICD-10-CM

## 2022-03-20 NOTE — Progress Notes (Unsigned)
Established Patient Office Visit  Subjective:  Patient ID: Cindy Garrison, female    DOB: 09-21-1970  Age: 51 y.o. MRN: [id number]  CC:  No chief complaint on file.   HPI 01/2021 Cindy Garrison presents for follow up after ED visit and for hypertension follow up. [place] interpretation services were used, with [place], [phone].   The patient visited the ED for the ongoing issue of lower abdominal pain. This is attributed to the uterine leiomyoma She states that the pain has actually improved recently. The pain is bilateral and she notices it on her left and right sides. She admits some vaginal pain that she has noticed when wiping after urinating. She denies any vaginal discharge or bleeding. An MRI was ordered by gynecology (Dr. Elgie Congo) to assess the uterine leiomyoma, with plans for surgery. The patient is unsure of what the MRI is and has not been able to have the imaging done yet. She would appreciate assistance with scheduling this scan. It is vital that this scan be completed.  Patient has diagnosis of schizoaffective disorder, for which she takes haloperidol (Haldol). She has established care with behavioral health, Eulis Canner, NP.  Since her last visit here, she has been able to get a screening mammogram which was negative. She does need a colon cancer screening.   The patient's blood pressure is at therapeutic goal today. She has been confused regarding the dosage of the Valsartan-HCTZ, but should now be taking the higher dose as prescribed last time. She has reported some dizziness since last visit, but denies any palpitations. She has no marked edema.   05/28/2021 Patient seen in return follow-up and this visit was assisted by video interpreter [employer] for [place] [phone] The patient has been seen by gynecology's on several occasions since I last saw her in January.  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  This is the cause of her pelvic pain.  She was begun on  Orilissa to see if this would cause decreased uterine bleeding and facilitate absorption of the hematometra The patient took about a pack and a half of the medication given to her and has not been able to complete she had excess sweating and increased acne on the face and the gluteal area.  She also had other side effects she did not like.  Patient has return visit June 1 with Dr. Kennon Rounds to discuss further options. Patient also has fallen twice at work and on arrival blood pressure is 115/79.  We had recently increased her blood pressure medicine to add valsartan HCT along with amlodipine and I think this may be too much medication for this patient but tickly with the ongoing issues in the uterus.  Patient also has increased allergies and picked up a box of Allegra-D I indicated to her that she should not be taking any allergy medicines with the components.  We can get her prescription version of Zyrtec.  There are no other complaints.  MRI done in February has been reviewed and documented as below  Patient complains of allergic rhinitis worse with the increased pollen were currently experiencing she picked up an [place] from the pharmacy and I told her to stop this because of the D component  7/18 Patient seen in return follow-up this visit was assisted by [place] interpreter Stann Mainland [phone] Patient is in good spirits on arrival states her bleeding has significantly improved as has her pelvic pain on the current dose of norethindrone  On arrival blood pressure  is good 134/70.  Patient is having continued allergies she did not get her cetirizine filled.  She also complains of bilateral upper and lower tooth pain.  There are no other complaints.  Patient states her dizziness and frequent falls have stopped  12/27 Patient is seen in return follow-up and is seen with the e[place] in person interpreter Cedric.  The patient has history of hypertension, chronic severe iron deficiency from bleeding in the  uterus, allergic rhinitis, low back pain, visual changes.  Blood pressure was elevated 171/63.  Is very difficult to carry on this encounter because of language barrier.  She brings a bag of medications most of which she is currently not taking.  She is supposed to be on norethindrone for bleeding in the uterus she has not been taking this.  She also supposed be on amlodipine for blood pressure she is not taking this as well.  She complains of dryness in the hands and pain in the feet. Below is documentation from her visit in October saw [facility] 11/11/21  ED 12/15 for foot pain  Saw GYN 10/20 and continues Aygestin for hematometra no surgery needed  1. Allergic rhinitis due to pollen, unspecified seasonality D/c zyrtec - levocetirizine (XYZAL) 5 MG tablet; Take 1 tablet (5 mg total) by mouth every evening.  Dispense: 90 tablet; Refill: 1   2. Dry skin On ears and feet-currently being treated for tinea pedis and otitis externa but I am wondering if she may have seborrhea or psoriasis - Ambulatory referral to derm - levocetirizine (XYZAL) 5 MG tablet; Take 1 tablet (5 mg total) by mouth every evening.  Dispense: 90 tablet; Refill: 1   3. Vision changes Wants to have prescription safety glasses made - Ambulatory referral to Ophthalmology   4. Edema, unspecified type Patient information given  - Basic metabolic panel - TSH - furosemide (LASIX) 20 MG tablet; Take 1 daily for 3 days as needed for swelling (use sparingly)  Dispense: 30 tablet; Refill: 3   5. Essential hypertension Continue valsartan/HCT - Basic metabolic panel   6. Hospital discharge follow-up     Patient did not want to use french interpreter.  We were unable to get a [place] interpreter on the spot.  I understand minimal french and she had things written down that I was able to read in [place] that is very similar to written french.  All issues addressed in English/written french/[place].    Note the patient prefers ewe language and  does not understand [place] well enough.  She is contemplating trying to declare disability but wants to continue to work we explained her this is not possible.  03/23/22 Mammo neg  ED 02/2022 Return for intractable cough, coughing up blood, fevers > 100.4 unrelieved by medication, shortness of breath, intractable vomiting, chest pain, shortness of breath, weakness, numbness, changes in speech, facial asymmetry, abdominal pain, passing out, Inability to tolerate liquids or food, cough, altered mental status or any concerns. No signs of systemic illness or infection. The patient is nontoxic-appearing on exam and vital signs are within normal limits.  I have reviewed the triage vital signs and the nursing notes. Pertinent labs & imaging results that were available during my care of the patient were reviewed by me and considered in my medical decision making (see chart for details). After history, exam, and medical workup I feel the patient has been appropriately medically screened and is safe for discharge home. Pertinent diagnose Past Medical History:  Diagnosis Date   #  [id number]    Acne    Back pain    Essential hypertension    Seasonal allergies    Syncope 09/16/2014   Syncope and collapse 09/16/2014    Past Surgical History:  Procedure Laterality Date   DILITATION & CURRETTAGE/HYSTROSCOPY WITH NOVASURE ABLATION N/A 12/22/2017   Procedure: DILATATION & CURETTAGE/ HYSTEROSCOPY WITH ENDOMETRIAL FIBROID AND  NOVASURE ABLATION;  Surgeon: Lavonia Drafts, MD;  Location: [facility];  Service: Gynecology;  Laterality: N/A;    Family History  Problem Relation Age of Onset   Breast cancer Sister     Social History   Socioeconomic History   Marital status: Married    Spouse name: Not on file   Number of children: Not on file   Years of education: Not on file   Highest education level: Not on file  Occupational History   Not on file  Tobacco Use   Smoking status: Never   Smokeless tobacco:  Never  Vaping Use   Vaping Use: Never used  Substance and Sexual Activity   Alcohol use: Not Currently    Comment: occ.   Drug use: No   Sexual activity: Not on file    Comment: monogamous female partner for 5 years (2010)  Other Topics Concern   Not on file  Social History Narrative   Not on file   Social Determinants of Health   Financial Resource Strain: High Risk (05/28/2021)   Overall Financial Resource Strain (CARDIA)    Difficulty of Paying Living Expenses: Very hard  Food Insecurity: Food Insecurity Present (05/28/2021)   Hunger Vital Sign    Worried About Running Out of Food in the Last Year: Often true    Ran Out of Food in the Last Year: Often true  Transportation Needs: No Transportation Needs (03/20/2021)   PRAPARE - Hydrologist (Medical): No    Lack of Transportation (Non-Medical): No  Physical Activity: Not on file  Stress: Not on file  Social Connections: Not on file  Intimate Partner Violence: Not on file    Outpatient Medications Prior to Visit  Medication Sig Dispense Refill   acetaminophen (TYLENOL) 500 MG tablet Take 500 mg by mouth every 6 (six) hours as needed for mild pain or fever.     amLODipine (NORVASC) 10 MG tablet Take 1 tablet (10 mg total) by mouth daily. 90 tablet 2   atorvastatin (LIPITOR) 20 MG tablet Take 1 tablet (20 mg total) by mouth daily. 90 tablet 2   clobetasol cream (TEMOVATE) [id number] % Apply 1 Application topically 2 (two) times daily. 60 g 1   furosemide (LASIX) 20 MG tablet Take 1 daily as needed for swelling 30 tablet 3   haloperidol (HALDOL) 1 MG tablet Take 1 tablet (1 mg total) by mouth at bedtime. 30 tablet 1   levocetirizine (XYZAL) 5 MG tablet Take 1 tablet (5 mg total) by mouth every evening. 90 tablet 1   norethindrone (AYGESTIN) 5 MG tablet Take 1 tablet (5 mg total) by mouth daily. 90 tablet 2   valsartan-hydrochlorothiazide (DIOVAN-HCT) 320-25 MG tablet Take 1 tablet by mouth daily. 90 tablet 3    Vitamin D, Ergocalciferol, (DRISDOL) 1.25 MG (50000 UNIT) CAPS capsule Take 1 capsule (50,000 Units total) by mouth every 7 (seven) days on Wednesday 16 capsule 4   No facility-administered medications prior to visit.    Allergies  Allergen Reactions   Other Rash    Blood pressure pill (unknown name)  ROS Review of Systems  Constitutional: Negative.  Negative for fatigue.  HENT:  Positive for postnasal drip and rhinorrhea. Negative for ear pain, sinus pressure, sore throat, trouble swallowing and voice change.   Eyes: Negative.  Negative for visual disturbance.  Respiratory: Negative.  Negative for apnea, cough, choking, chest tightness, shortness of breath, wheezing and stridor.   Cardiovascular: Negative.  Negative for chest pain, palpitations and leg swelling.  Gastrointestinal: Negative.  Negative for abdominal distention, abdominal pain, nausea and vomiting.  Genitourinary: Negative.  Negative for dysuria, pelvic pain, vaginal bleeding and vaginal discharge.  Musculoskeletal:  Positive for arthralgias, back pain and joint swelling. Negative for myalgias.  Skin:  Positive for rash.       Acne has resolved  Allergic/Immunologic: Negative.  Negative for environmental allergies and food allergies.  Neurological: Negative.  Negative for dizziness, syncope, weakness and headaches.  Hematological: Negative.  Negative for adenopathy. Does not bruise/bleed easily.  Psychiatric/Behavioral: Negative.  Negative for agitation and sleep disturbance. The patient is not nervous/anxious.       Objective:    Physical Exam Constitutional:      Appearance: Normal appearance.  HENT:     Head: Normocephalic and atraumatic.     Mouth/Throat:     Mouth: Mucous membranes are moist.     Dentition: Normal dentition (dentition fair).     Pharynx: Oropharynx is clear. Uvula midline.  Cardiovascular:     Rate and Rhythm: Normal rate and regular rhythm.     Heart sounds: Normal heart sounds. No  murmur heard.    No friction rub. No gallop.  Pulmonary:     Effort: Pulmonary effort is normal.     Breath sounds: Normal breath sounds. No wheezing, rhonchi or rales.  Abdominal:     General: There is no distension.     Tenderness: There is no abdominal tenderness.  Skin:    General: Skin is warm and dry.     Findings: No rash.     Comments: Acne has resolved on the face hands are very dry with mild eczematous rash  Neurological:     General: No focal deficit present.     Mental Status: She is alert.  Psychiatric:        Mood and Affect: Mood normal.        Behavior: Behavior normal.     LMP 09/12/2017 (LMP Unknown)  Wt Readings from Last 3 Encounters:  02/22/22 111 lb 15.9 oz (50.8 kg)  01/13/22 128 lb (58.1 kg)  11/11/21 141 lb 3.2 oz (64 kg)  MRI 02/2021 FINDINGS: COMBINED FINDINGS FOR BOTH MR ABDOMEN AND PELVIS   Study is significantly limited due to motion, uncooperative patient and lack of contrast.   Lower chest: Not visualized.   Hepatobiliary: Liver appears normal in size and contour with no suspicious mass identified. Gallbladder within normal limits. No biliary ductal dilatation identified.   Pancreas: Grossly normal with no mass or ductal dilatation visualized.   Spleen:  Within normal limits in size and appearance.   Adrenals/Urinary Tract: Adrenal glands appear within normal limits. Mild right hydronephrosis similar to previous CT. No definite suspicious renal mass identified.   Stomach/Bowel: No evidence of bowel obstruction.   Vascular/Lymphatic: No pathologically enlarged lymph nodes identified. No abdominal aortic aneurysm demonstrated.   Reproductive: Uterus measures approximately 11.3 x 16.3 x 24 cm in AP, transverse and craniocaudal dimensions. Several hypointense T2 signal likely uterine fibroids identified measuring up to 4.6 x 2.4 cm on the left.  The endometrium is markedly distended with homogeneous hyperintense T1 and hyperintense T2  signal material, likely blood products, measuring approximally 10.6 x 13.7 x 22 cm in AP, transverse and craniocaudal dimensions. Indeterminate heterogeneity of the cervix measuring approximately 3 x 2.4 cm best seen on the sagittal T2 sequence. Vaginal canal appears within normal limits.   Ovaries are not well visualized. Lobulated multicystic appearance of the bilateral adnexa/ovaries measuring up to 5.3 x 3.8 cm in axial dimensions on the left and 4.7 x 3.7 cm on the right. Associated tubular/cystic components demonstrate hyperintense T1 and T2 signal.   Other:  No ascites visualized.   Musculoskeletal: No suspicious bony lesions identified.   IMPRESSION: 1. Hematometra with marked distension of the endometrial cavity and enlargement of the uterus, suggesting obstruction at the level of the cervix. There is an area of heterogeneity in the cervix which is indeterminate and not well evaluated due to motion/lack of contrast, recommend direct visualization/gynecologic evaluation. 2. Ovaries are not well evaluated. There is evidence of bilateral hematosalpinx and likely simple and hemorrhagic ovarian cysts, left greater than right. 3. No ascites or lymphadenopathy identified. 4. Uterine fibroids. 5. Mild right hydronephrosis similar to previous study, possibly secondary to uterine mass effect on the distal ureter  Health Maintenance Due  Topic Date Due   COLON CANCER SCREENING ANNUAL FOBT  01/29/2022      There are no preventive care reminders to display for this patient.  Lab Results  Component Value Date   TSH 1.480 11/11/2021   Lab Results  Component Value Date   WBC 5.2 01/13/2022   HGB 13.1 01/13/2022   HCT 39.5 01/13/2022   MCV 80 01/13/2022   PLT 260 01/13/2022   Lab Results  Component Value Date   NA 140 01/13/2022   K 3.6 01/13/2022   CO2 21 01/13/2022   GLUCOSE 85 01/13/2022   BUN 9 01/13/2022   CREATININE 0.62 01/13/2022   BILITOT 0.5 05/28/2021    ALKPHOS 59 05/28/2021   AST 25 05/28/2021   ALT 25 05/28/2021   PROT 6.7 05/28/2021   ALBUMIN 4.3 05/28/2021   CALCIUM 9.9 01/13/2022   ANIONGAP 13 01/01/2021   EGFR 106 01/13/2022   Lab Results  Component Value Date   CHOL 164 05/28/2021   Lab Results  Component Value Date   HDL 69 05/28/2021   Lab Results  Component Value Date   LDLCALC 80 05/28/2021   Lab Results  Component Value Date   TRIG 83 05/28/2021   Lab Results  Component Value Date   CHOLHDL 2.4 05/28/2021   Lab Results  Component Value Date   HGBA1C 5.5 01/24/2018      Assessment & Plan:   Problem List Items Addressed This Visit   None No orders of the defined types were placed in this encounter. 40 minutes spent assessing patient high risk for hospitalization history of falls issues with uterus skin conditions medication side effects food access other social determinants of health barriers  Follow-up: No follow-ups on file.    Asencion Noble, MD

## 2022-03-21 ENCOUNTER — Encounter (HOSPITAL_COMMUNITY): Payer: Self-pay | Admitting: Physician Assistant

## 2022-03-21 MED ORDER — HALOPERIDOL 1 MG PO TABS: 1.0000 mg | ORAL_TABLET | Freq: Every day | ORAL | 1 refills | Status: DC

## 2022-03-21 NOTE — Progress Notes (Unsigned)
[facility] MD/PA/NP OP Progress Note  03/21/2022 7:38 PM Cindy Garrison  MRN:  [id number]  Chief Complaint:  Chief Complaint  Patient presents with   Follow-up   Medication Refill   HPI: ***  Cindy Garrison  Visit Diagnosis:    ICD-10-CM   1. Schizoaffective disorder, unspecified type ([place])  F25.9 haloperidol (HALDOL) 1 MG tablet      Past Psychiatric History:  Schizoaffective disorder.  Patient has been on Haldol for 10 years.  Prior to establishing care with [facility], patient was being seen at [facility]   Past Medical History:  Past Medical History:  Diagnosis Date   #[id number]    Acne    Back pain    Essential hypertension    Seasonal allergies    Syncope 09/16/2014   Syncope and collapse 09/16/2014    Past Surgical History:  Procedure Laterality Date   DILITATION & CURRETTAGE/HYSTROSCOPY WITH NOVASURE ABLATION N/A 12/22/2017   Procedure: DILATATION & CURETTAGE/ HYSTEROSCOPY WITH ENDOMETRIAL FIBROID AND  NOVASURE ABLATION;  Surgeon: Lavonia Drafts, MD;  Location: [facility];  Service: Gynecology;  Laterality: N/A;    Family Psychiatric History:  Patient denies  Family History:  Family History  Problem Relation Age of Onset   Breast cancer Sister     Social History:  Social History   Socioeconomic History   Marital status: Married    Spouse name: Not on file   Number of children: Not on file   Years of education: Not on file   Highest education level: Not on file  Occupational History   Not on file  Tobacco Use   Smoking status: Never   Smokeless tobacco: Never  Vaping Use   Vaping Use: Never used  Substance and Sexual Activity   Alcohol use: Not Currently    Comment: occ.   Drug use: No   Sexual activity: Not on file    Comment: monogamous female partner for 5 years (2010)  Other Topics Concern   Not on file  Social History Narrative   Not on file   Social Determinants of Health   Financial Resource Strain: High Risk  (05/28/2021)   Overall Financial Resource Strain (CARDIA)    Difficulty of Paying Living Expenses: Very hard  Food Insecurity: Food Insecurity Present (05/28/2021)   Hunger Vital Sign    Worried About Running Out of Food in the Last Year: Often true    Ran Out of Food in the Last Year: Often true  Transportation Needs: No Transportation Needs (03/20/2021)   PRAPARE - Hydrologist (Medical): No    Lack of Transportation (Non-Medical): No  Physical Activity: Not on file  Stress: Not on file  Social Connections: Not on file    Allergies:  Allergies  Allergen Reactions   Other Rash    Blood pressure pill (unknown name)    Metabolic Disorder Labs: Lab Results  Component Value Date   HGBA1C 5.5 01/24/2018   No results found for: "PROLACTIN" Lab Results  Component Value Date   CHOL 164 05/28/2021   TRIG 83 05/28/2021   HDL 69 05/28/2021   CHOLHDL 2.4 05/28/2021   LDLCALC 80 05/28/2021   LDLCALC 90 01/23/2020   Lab Results  Component Value Date   TSH 1.480 11/11/2021   TSH 1.260 06/20/2019    Therapeutic Level Labs: No results found for: "LITHIUM" No results found for: "VALPROATE" No results found for: "CBMZ"  Current Medications: Current Outpatient Medications  Medication Sig Dispense Refill   acetaminophen (TYLENOL) 500 MG tablet Take 500 mg by mouth every 6 (six) hours as needed for mild pain or fever.     amLODipine (NORVASC) 10 MG tablet Take 1 tablet (10 mg total) by mouth daily. 90 tablet 2   atorvastatin (LIPITOR) 20 MG tablet Take 1 tablet (20 mg total) by mouth daily. 90 tablet 2   clobetasol cream (TEMOVATE) [id number] % Apply 1 Application topically 2 (two) times daily. 60 g 1   furosemide (LASIX) 20 MG tablet Take 1 daily as needed for swelling 30 tablet 3   haloperidol (HALDOL) 1 MG tablet Take 1 tablet (1 mg total) by mouth at bedtime. 30 tablet 1   levocetirizine (XYZAL) 5 MG tablet Take 1 tablet (5 mg total) by mouth every evening.  90 tablet 1   norethindrone (AYGESTIN) 5 MG tablet Take 1 tablet (5 mg total) by mouth daily. 90 tablet 2   valsartan-hydrochlorothiazide (DIOVAN-HCT) 320-25 MG tablet Take 1 tablet by mouth daily. 90 tablet 3   Vitamin D, Ergocalciferol, (DRISDOL) 1.25 MG (50000 UNIT) CAPS capsule Take 1 capsule (50,000 Units total) by mouth every 7 (seven) days on Wednesday 16 capsule 4   No current facility-administered medications for this visit.     Musculoskeletal: Strength & Muscle Tone: within normal limits Gait & Station: normal Patient leans: N/A  Psychiatric Specialty Exam: Review of Systems  Psychiatric/Behavioral:  Negative for decreased concentration, dysphoric mood, hallucinations, self-injury, sleep disturbance and suicidal ideas. The patient is not nervous/anxious and is not hyperactive.     Blood pressure (!) 154/88, pulse (!) 103, temperature 98.9 F (37.2 C), temperature source Oral, height '4\' 9"'$  (1.448 m), weight 133 lb 9.6 oz (60.6 kg), last menstrual period 09/12/2017.Body mass index is 28.91 kg/m.  General Appearance: Casual  Eye Contact:  Fair  Speech:  Clear and Coherent and Normal Rate  Volume:  Normal  Mood:  Euthymic  Affect:  Appropriate  Thought Process:  Coherent, Goal Directed, and Descriptions of Associations: Intact  Orientation:  Full (Time, Place, and Person)  Thought Content: WDL   Suicidal Thoughts:  No  Homicidal Thoughts:  No  Memory:  Immediate;   Good Recent;   Good Remote;   Good  Judgement:  Good  Insight:  Good  Psychomotor Activity:  Normal  Concentration:  Concentration: Good and Attention Span: Good  Recall:  Good  Fund of Knowledge: Good  Language: Good  Akathisia:  No  Handed:  Right  AIMS (if indicated): not done  Assets:  Communication Skills Desire for Improvement Financial Resources/Insurance Housing Intimacy Social Support  ADL's:  Intact  Cognition: WNL  Sleep:  Fair   Screenings: AIMS    Flowsheet Row Clinical Support  from 01/22/2022 in [facility] Office Visit from 04/01/2020 in [place] Total Score 2 0      GAD-7    Flowsheet Row Clinical Support from 03/19/2022 in [place] from 01/22/2022 in [facility] Office Visit from 01/13/2022 in [place] Office Visit from 03/20/2021 in [place] [facility] [place] [facility] Clinical Support from 01/29/2021 in [facility]  Total GAD-7 Score 0 '1 3 8 18      '$ PHQ2-9    [place] from 03/19/2022 in [place] from 01/22/2022 in [place]  [facility] Office Visit from 01/13/2022 in [place] Office Visit from 05/28/2021 in [place] Office Visit from 03/20/2021 in [facility] [place] [facility]  PHQ-2 Total Score 0 0 '3 3 2  '$ PHQ-9 Total Score -- -- 3 -- 2      Flowsheet Row Clinical Support from 03/19/2022 in [facility] ED from 02/22/2022 in [facility] at [facility] from 02/04/2022 in [place] No Risk No Risk No Risk        Assessment and Plan: ***    Collaboration of Care: Collaboration of Care: Medication Management AEB provider managing patient's psychiatric medications, Primary Care Provider AEB patient being seen by a primary care provider, and Psychiatrist AEB patient being followed by mental health provider at this facility  Patient/Guardian was advised Release of Information must be obtained prior to any record release in order to collaborate their care with an outside provider. Patient/Guardian was advised if they have not already  done so to contact the registration department to sign all necessary forms in order for [place] to release information regarding their care.   Consent: Patient/Guardian gives verbal consent for treatment and assignment of benefits for services provided during this visit. Patient/Guardian expressed understanding and agreed to proceed.   1. Schizoaffective disorder, unspecified type ([place])  - haloperidol (HALDOL) 1 MG tablet; Take 1 tablet (1 mg total) by mouth at bedtime.  Dispense: 30 tablet; Refill: 1  Patient to follow-up in 2 months Provider spent a total of 25 minutes with the patient/reviewing patient's chart  Malachy Mood, PA 03/21/2022, 7:38 PM

## 2022-03-23 ENCOUNTER — Ambulatory Visit: Payer: 59 | Attending: Critical Care Medicine | Admitting: Critical Care Medicine

## 2022-03-23 ENCOUNTER — Encounter: Payer: Self-pay | Admitting: Critical Care Medicine

## 2022-03-23 VITALS — BP 169/77 | HR 87 | Wt 133.2 lb

## 2022-03-23 DIAGNOSIS — N857 Hematometra: Secondary | ICD-10-CM | POA: Diagnosis not present

## 2022-03-23 DIAGNOSIS — E7800 Pure hypercholesterolemia, unspecified: Secondary | ICD-10-CM | POA: Diagnosis not present

## 2022-03-23 DIAGNOSIS — Z789 Other specified health status: Secondary | ICD-10-CM

## 2022-03-23 DIAGNOSIS — L853 Xerosis cutis: Secondary | ICD-10-CM | POA: Diagnosis not present

## 2022-03-23 DIAGNOSIS — F259 Schizoaffective disorder, unspecified: Secondary | ICD-10-CM

## 2022-03-23 DIAGNOSIS — Z1211 Encounter for screening for malignant neoplasm of colon: Secondary | ICD-10-CM

## 2022-03-23 DIAGNOSIS — I10 Essential (primary) hypertension: Secondary | ICD-10-CM | POA: Diagnosis not present

## 2022-03-23 DIAGNOSIS — Z1231 Encounter for screening mammogram for malignant neoplasm of breast: Secondary | ICD-10-CM

## 2022-03-23 DIAGNOSIS — R609 Edema, unspecified: Secondary | ICD-10-CM | POA: Diagnosis not present

## 2022-03-23 DIAGNOSIS — E559 Vitamin D deficiency, unspecified: Secondary | ICD-10-CM | POA: Diagnosis not present

## 2022-03-23 DIAGNOSIS — J301 Allergic rhinitis due to pollen: Secondary | ICD-10-CM | POA: Diagnosis not present

## 2022-03-23 MED ORDER — CLOBETASOL PROPIONATE 0.05 % EX CREA: 1.0000 | TOPICAL_CREAM | Freq: Two times a day (BID) | CUTANEOUS | 1 refills | Status: DC

## 2022-03-23 MED ORDER — FUROSEMIDE 20 MG PO TABS: ORAL_TABLET | ORAL | 3 refills | Status: DC

## 2022-03-23 MED ORDER — VITAMIN D (ERGOCALCIFEROL) 1.25 MG (50000 UNIT) PO CAPS: 50000.0000 [IU] | ORAL_CAPSULE | ORAL | 4 refills | Status: DC

## 2022-03-23 MED ORDER — LEVOCETIRIZINE DIHYDROCHLORIDE 5 MG PO TABS: 5.0000 mg | ORAL_TABLET | Freq: Every evening | ORAL | 1 refills | Status: DC

## 2022-03-23 MED ORDER — VALSARTAN-HYDROCHLOROTHIAZIDE 320-25 MG PO TABS: 1.0000 | ORAL_TABLET | Freq: Every day | ORAL | 3 refills | Status: DC

## 2022-03-23 MED ORDER — ATORVASTATIN CALCIUM 20 MG PO TABS: 20.0000 mg | ORAL_TABLET | Freq: Every day | ORAL | 2 refills | Status: DC

## 2022-03-23 NOTE — Assessment & Plan Note (Signed)
Hypertension not well-controlled she is not taking any medication adverse side effect to amlodipine causing edema  Patient will resume valsartan HCT at 320/25 daily will see her back in short-term follow-up with clinical pharmacy she will continue furosemide as needed

## 2022-03-23 NOTE — Assessment & Plan Note (Signed)
Continue antihistamine

## 2022-03-23 NOTE — Assessment & Plan Note (Signed)
Doing better with in person interpreter

## 2022-03-23 NOTE — Patient Instructions (Addendum)
Stop amlodipine and stay on valsartan HCT refill sent to your pharmacy No other medication changes all other medicines sent to pharmacy including the cream for the rash Use the furosemide as needed for swelling  Colon cancer screening referral was sent to gastroenterology  Return for blood pressure recheck with our clinical pharmacist [facility] in the next 3 to 4 weeks he will call you for the appointment  Return to see Dr. Joya Gaskins 2 months for blood pressure follow-up  Dzudz? amlodipine eye nn? valsartan HCT refill si wo?o ?e w atikedzrafe [place] bubu a?eke metr?a atike bubu siwo kat wo?ona ?e atikedzrafe siwo dome atike si wots?na tsia ?u?udedi nu h le o Z furosemide la alesi whi na abi  Wo?o ame si wo?o ?e d?gbo me d?dz? dodokp? fe amed?d? ?e d?gbo?utinunya me  Tr? yi ?akp? ?u fe sisi gbugb?gakp? le mafe atikew?la Luke gb? le kwasi?a 3 va ?o 4 siwo gb?na me afo ka na w hena ?eyi?ia  Tr? yi ?Clementeen Hoof gb? ?leti 2 hena ?u fe sisi fe kpl?kpl? ?o

## 2022-03-23 NOTE — Assessment & Plan Note (Signed)
Continue weekly vitamin D

## 2022-03-23 NOTE — Assessment & Plan Note (Signed)
Continue statin. 

## 2022-03-23 NOTE — Assessment & Plan Note (Signed)
Mammogram negative recheck 1 year

## 2022-03-23 NOTE — Assessment & Plan Note (Signed)
Continue norethindrone per gynecology

## 2022-04-29 ENCOUNTER — Ambulatory Visit: Payer: 59 | Admitting: Pharmacist

## 2022-05-21 ENCOUNTER — Encounter (HOSPITAL_COMMUNITY): Payer: 59 | Admitting: Physician Assistant

## 2022-06-18 ENCOUNTER — Ambulatory Visit (INDEPENDENT_AMBULATORY_CARE_PROVIDER_SITE_OTHER): Payer: 59 | Admitting: Physician Assistant

## 2022-06-18 DIAGNOSIS — F259 Schizoaffective disorder, unspecified: Secondary | ICD-10-CM | POA: Diagnosis not present

## 2022-06-18 MED ORDER — HALOPERIDOL 1 MG PO TABS: 1.0000 mg | ORAL_TABLET | Freq: Every day | ORAL | 2 refills | Status: DC

## 2022-06-21 ENCOUNTER — Encounter (HOSPITAL_COMMUNITY): Payer: Self-pay | Admitting: Physician Assistant

## 2022-06-21 NOTE — Progress Notes (Signed)
[facility] MD/PA/NP OP Progress Note  06/21/2022 8:03 AM Cindy Garrison  MRN:  [id number]  Chief Complaint:  Chief Complaint  Patient presents with   Follow-up   Medication Refill   HPI:   Cindy Garrison is a 52 year old, African-American female with a past psychiatric history significant for schizoaffective disorder (unspecified type) who presents to [facility] for follow-up and medication management.  Language interpreter services were utilized during the duration of the encounter.  Patient is currently being managed on the following medication: Haldol 1 mg at bedtime.  Patient reports that she is doing well and denies issues with medication.  Patient denies depression or anxiety at this time.  Patient further denies any other stressors at this time.  Prior to the conclusion of the encounter, patient voiced her concerns regarding social issues within the [place] culture.  Patient is alert and oriented x 4, calm, cooperative, and fully engaged in conversation during the encounter.  Patient endorses normal mood.  Patient denies suicidal or homicidal ideations.  She further denies auditory or visual hallucinations and does not appear to be responding to internal/external stimuli.  Patient endorses fair sleep and receives on average 4 to 5 hours of sleep each night.  Patient endorses good appetite and eats on average 3 meals per day.  Patient denies alcohol consumption, tobacco use, and illicit drug use.  Visit Diagnosis:    ICD-10-CM   1. Schizoaffective disorder, unspecified type (H[facility])  F25.9 haloperidol (HALDOL) 1 MG tablet      Past Psychiatric History:  Schizoaffective disorder.  Patient has been on Haldol for 10 years.  Prior to establishing care with [facility], patient was being seen at [facility]   Past Medical History:  Past Medical History:  Diagnosis Date   #[id number]    Acne    Back pain    Essential hypertension     Seasonal allergies    Syncope 09/16/2014   Syncope and collapse 09/16/2014    Past Surgical History:  Procedure Laterality Date   DILITATION & CURRETTAGE/HYSTROSCOPY WITH NOVASURE ABLATION N/A 12/22/2017   Procedure: DILATATION & CURETTAGE/ HYSTEROSCOPY WITH ENDOMETRIAL FIBROID AND  NOVASURE ABLATION;  Surgeon: Willodean Rosenthal, MD;  Location: [facility];  Service: Gynecology;  Laterality: N/A;    Family Psychiatric History:  Patient denies  Family History:  Family History  Problem Relation Age of Onset   Breast cancer Sister     Social History:  Social History   Socioeconomic History   Marital status: Married    Spouse name: Not on file   Number of children: Not on file   Years of education: Not on file   Highest education level: Not on file  Occupational History   Not on file  Tobacco Use   Smoking status: Never   Smokeless tobacco: Never  Vaping Use   Vaping Use: Never used  Substance and Sexual Activity   Alcohol use: Not Currently    Comment: occ.   Drug use: No   Sexual activity: Not on file    Comment: monogamous female partner for 5 years (2010)  Other Topics Concern   Not on file  Social History Narrative   Not on file   Social Determinants of Health   Financial Resource Strain: High Risk (05/28/2021)   Overall Financial Resource Strain (CARDIA)    Difficulty of Paying Living Expenses: Very hard  Food Insecurity: Food Insecurity Present (05/28/2021)   Hunger Vital Sign  Worried About Programme researcher, broadcasting/film/video in the Last Year: Often true    [employer] of Food in the Last Year: Often true  Transportation Needs: No Transportation Needs (03/20/2021)   PRAPARE - Administrator, Civil Service (Medical): No    Lack of Transportation (Non-Medical): No  Physical Activity: Not on file  Stress: Not on file  Social Connections: Not on file    Allergies:  Allergies  Allergen Reactions   Other Rash    Blood pressure pill (unknown name)    Metabolic  Disorder Labs: Lab Results  Component Value Date   HGBA1C 5.5 01/24/2018   No results found for: "PROLACTIN" Lab Results  Component Value Date   CHOL 164 05/28/2021   TRIG 83 05/28/2021   HDL 69 05/28/2021   CHOLHDL 2.4 05/28/2021   LDLCALC 80 05/28/2021   LDLCALC 90 01/23/2020   Lab Results  Component Value Date   TSH 1.480 11/11/2021   TSH 1.260 06/20/2019    Therapeutic Level Labs: No results found for: "LITHIUM" No results found for: "VALPROATE" No results found for: "CBMZ"  Current Medications: Current Outpatient Medications  Medication Sig Dispense Refill   acetaminophen (TYLENOL) 500 MG tablet Take 500 mg by mouth every 6 (six) hours as needed for mild pain or fever.     atorvastatin (LIPITOR) 20 MG tablet Take 1 tablet (20 mg total) by mouth daily. 90 tablet 2   clobetasol cream (TEMOVATE) 0.05 % Apply 1 Application topically 2 (two) times daily. 60 g 1   furosemide (LASIX) 20 MG tablet Take 1 daily as needed for swelling 30 tablet 3   haloperidol (HALDOL) 1 MG tablet Take 1 tablet (1 mg total) by mouth at bedtime. 30 tablet 2   levocetirizine (XYZAL) 5 MG tablet Take 1 tablet (5 mg total) by mouth every evening. 90 tablet 1   meloxicam (MOBIC) 15 MG tablet Take 15 mg by mouth daily.     norethindrone (AYGESTIN) 5 MG tablet Take 1 tablet (5 mg total) by mouth daily. 90 tablet 2   valsartan-hydrochlorothiazide (DIOVAN-HCT) 320-25 MG tablet Take 1 tablet by mouth daily. 90 tablet 3   Vitamin D, Ergocalciferol, (DRISDOL) 1.25 MG (50000 UNIT) CAPS capsule Take 1 capsule (50,000 Units total) by mouth every 7 (seven) days on Wednesday 16 capsule 4   No current facility-administered medications for this visit.     Musculoskeletal: Strength & Muscle Tone: within normal limits Gait & Station: normal Patient leans: N/A  Psychiatric Specialty Exam: Review of Systems  Psychiatric/Behavioral:  Negative for decreased concentration, dysphoric mood, hallucinations,  self-injury, sleep disturbance and suicidal ideas. The patient is not nervous/anxious and is not hyperactive.     Blood pressure (!) 206/93, pulse 87, temperature 98.1 F (36.7 C), temperature source Oral, weight 134 lb (60.8 kg), last menstrual period 09/12/2017, SpO2 100 %.Body mass index is 29 kg/m.  General Appearance: Casual  Eye Contact:  Fair  Speech:  Clear and Coherent and Normal Rate  Volume:  Normal  Mood:  Euthymic  Affect:  Appropriate  Thought Process:  Coherent, Goal Directed, and Descriptions of Associations: Intact  Orientation:  Full (Time, Place, and Person)  Thought Content: WDL   Suicidal Thoughts:  No  Homicidal Thoughts:  No  Memory:  Immediate;   Good Recent;   Good Remote;   Good  Judgement:  Good  Insight:  Good  Psychomotor Activity:  Normal  Concentration:  Concentration: Good and Attention Span: Good  Recall:  Good  Fund of Knowledge: Good  Language: Good  Akathisia:  No  Handed:  Right  AIMS (if indicated): not done  Assets:  Communication Skills Desire for Improvement Financial Resources/Insurance Housing Intimacy Social Support  ADL's:  Intact  Cognition: WNL  Sleep:  Fair   Screenings: AIMS    Flowsheet Row Clinical Support from 01/22/2022 in [facility] Office Visit from 04/01/2020 in [facility]  AIMS Total Score 2 0      GAD-7    Flowsheet Row Clinical Support from 06/18/2022 in [facility] Clinical Support from 03/19/2022 in [facility] Clinical Support from 01/22/2022 in [facility] Office Visit from 01/13/2022 in [facility] Office Visit from 03/20/2021 [facility]  Total GAD-7 Score 0 0 1 3 8       PHQ2-9    Flowsheet Row Clinical Support from 06/18/2022 in [facility]  Clinical Support from 03/19/2022 in [facility] Clinical Support from 01/22/2022 in [facility] Office Visit from 01/13/2022 in [facility] Office Visit from 05/28/2021 in [facility]  PHQ-2 Total Score 0 0 0 3 3  PHQ-9 Total Score -- -- -- 3 --      Flowsheet Row Clinical Support from 06/18/2022 in [facility] Clinical Support from 03/19/2022 in [facility] ED from 02/22/2022 in [facility] Department at [facility]  C-SSRS RISK CATEGORY No Risk No Risk No Risk        Assessment and Plan:   Cindy Garrison is a 52 year old, African-American female with a past psychiatric history significant for schizoaffective disorder (unspecified type) who presents to [facility] for follow-up and medication management.  Language interpreter services were utilized during the duration of the encounter.  Patient denies issues with her current medication regimen and takes her medication regularly.  Patient denies depression or anxiety and further denies any new stressors at this time.  Patient will continue taking her medications as prescribed.  Patient's medications to be e-prescribed to pharmacy of choice.  Collaboration of Care: Collaboration of Care: Medication Management AEB provider managing patient's psychiatric medications, Primary Care Provider AEB patient being seen by a primary care provider, and Psychiatrist AEB patient being followed by mental health provider at this facility  Patient/Guardian was advised Release of Information must be obtained prior to any record release in order to collaborate their care with an outside provider. Patient/Guardian was advised if they have not already done so to contact the registration department to sign all necessary forms in order  for [place] to release information regarding their care.   Consent: Patient/Guardian gives verbal consent for treatment and assignment of benefits for services provided during this visit. Patient/Guardian expressed understanding and agreed to proceed.   1. Schizoaffective disorder, unspecified type (HCC)  - haloperidol (HALDOL) 1 MG tablet; Take 1 tablet (1 mg total) by mouth at bedtime.  Dispense: 30 tablet; Refill: 1  Patient to follow-up in 2 months Provider spent a total of 25 minutes with the patient/reviewing patient's chart  Meta Hatchet, PA 06/21/2022, 8:03 AM

## 2022-06-22 NOTE — Progress Notes (Unsigned)
Established Patient Office Visit  Subjective:  Patient ID: Cindy Garrison, female    DOB: 04/07/1970  Age: 51 y.o. MRN: [id number]  CC:  No chief complaint on file.   HPI 01/2021 Ysidro Evert Carne presents for follow up after ED visit and for hypertension follow up. [place] interpretation services were used, with [facility], [phone].   The patient visited the ED for the ongoing issue of lower abdominal pain. This is attributed to the uterine leiomyoma She states that the pain has actually improved recently. The pain is bilateral and she notices it on her left and right sides. She admits some vaginal pain that she has noticed when wiping after urinating. She denies any vaginal discharge or bleeding. An MRI was ordered by gynecology (Dr. Donavan Foil) to assess the uterine leiomyoma, with plans for surgery. The patient is unsure of what the MRI is and has not been able to have the imaging done yet. She would appreciate assistance with scheduling this scan. It is vital that this scan be completed.  Patient has diagnosis of schizoaffective disorder, for which she takes haloperidol (Haldol). She has established care with behavioral health, Toy Cookey, NP.  Since her last visit here, she has been able to get a screening mammogram which was negative. She does need a colon cancer screening.   The patient's blood pressure is at therapeutic goal today. She has been confused regarding the dosage of the Valsartan-HCTZ, but should now be taking the higher dose as prescribed last time. She has reported some dizziness since last visit, but denies any palpitations. She has no marked edema.   05/28/2021 Patient seen in return follow-up and this visit was assisted by video interpreter Latifah for [place] [phone] The patient has been seen by gynecology's on several occasions since I last saw her in January.  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  This is the cause of her pelvic pain.  She was begun on  Orilissa to see if this would cause decreased uterine bleeding and facilitate absorption of the hematometra The patient took about a pack and a half of the medication given to her and has not been able to complete she had excess sweating and increased acne on the face and the gluteal area.  She also had other side effects she did not like.  Patient has return visit June 1 with Dr. Shawnie Pons to discuss further options. Patient also has fallen twice at work and on arrival blood pressure is 115/79.  We had recently increased her blood pressure medicine to add valsartan HCT along with amlodipine and I think this may be too much medication for this patient but tickly with the ongoing issues in the uterus.  Patient also has increased allergies and picked up a box of Allegra-D I indicated to her that she should not be taking any allergy medicines with the components.  We can get her prescription version of Zyrtec.  There are no other complaints.  MRI done in February has been reviewed and documented as below  Patient complains of allergic rhinitis worse with the increased pollen were currently experiencing she picked up an Allegra-D from the pharmacy and I told her to stop this because of the D component  7/18 Patient seen in return follow-up this visit was assisted by [place] interpreter Cira Servant [phone] Patient is in good spirits on arrival states her bleeding has significantly improved as has her pelvic pain on the current dose of norethindrone  On arrival blood pressure  is good 134/70.  Patient is having continued allergies she did not get her cetirizine filled.  She also complains of bilateral upper and lower tooth pain.  There are no other complaints.  Patient states her dizziness and frequent falls have stopped  12/27 Patient is seen in return follow-up and is seen with the e[place] in person interpreter Cedric.  The patient has history of hypertension, chronic severe iron deficiency from bleeding in the  uterus, allergic rhinitis, low back pain, visual changes.  Blood pressure was elevated 171/63.  Is very difficult to carry on this encounter because of language barrier.  She brings a bag of medications most of which she is currently not taking.  She is supposed to be on norethindrone for bleeding in the uterus she has not been taking this.  She also supposed be on amlodipine for blood pressure she is not taking this as well.  She complains of dryness in the hands and pain in the feet. Below is documentation from her visit in October saw [facility] 11/11/21  ED 12/15 for foot pain  Saw GYN 10/20 and continues Aygestin for hematometra no surgery needed  1. Allergic rhinitis due to pollen, unspecified seasonality D/c zyrtec - levocetirizine (XYZAL) 5 MG tablet; Take 1 tablet (5 mg total) by mouth every evening.  Dispense: 90 tablet; Refill: 1   2. Dry skin On ears and feet-currently being treated for tinea pedis and otitis externa but I am wondering if she may have seborrhea or psoriasis - Ambulatory referral to derm - levocetirizine (XYZAL) 5 MG tablet; Take 1 tablet (5 mg total) by mouth every evening.  Dispense: 90 tablet; Refill: 1   3. Vision changes Wants to have prescription safety glasses made - Ambulatory referral to Ophthalmology   4. Edema, unspecified type Patient information given  - Basic metabolic panel - TSH - furosemide (LASIX) 20 MG tablet; Take 1 daily for 3 days as needed for swelling (use sparingly)  Dispense: 30 tablet; Refill: 3   5. Essential hypertension Continue valsartan/HCT - Basic metabolic panel   6. Hospital discharge follow-up     Patient did not want to use french interpreter.  We were unable to get a [place] interpreter on the spot.  I understand minimal french and she had things written down that I was able to read in [place] that is very similar to written french.  All issues addressed in English/written french/[place].    Note the patient prefers ewe language and  does not understand [place] well enough.  She is contemplating trying to declare disability but wants to continue to work we explained her this is not possible.  03/23/22 This patient seen in return follow-up as an assisted by in person interpreter Cedric for e[place] Recent mammogram was unremarkable.  Patient was in the ER in February for edema of the lower extremity she had her amlodipine held. On arrival blood pressure is elevated 169/77.  The patient's been holding all blood pressure medicines.  There are no other complaints.  She is followed by gynecology maintains oral birth control.  06/22/22  Past Medical History:  Diagnosis Date   #[id number]    Acne    Back pain    Essential hypertension    Seasonal allergies    Syncope 09/16/2014   Syncope and collapse 09/16/2014    Past Surgical History:  Procedure Laterality Date   DILITATION & CURRETTAGE/HYSTROSCOPY WITH NOVASURE ABLATION N/A 12/22/2017   Procedure: DILATATION & CURETTAGE/ HYSTEROSCOPY WITH ENDOMETRIAL FIBROID AND  NOVASURE ABLATION;  Surgeon: Willodean Rosenthal, MD;  Location: [facility];  Service: Gynecology;  Laterality: N/A;    Family History  Problem Relation Age of Onset   Breast cancer Sister     Social History   Socioeconomic History   Marital status: Married    Spouse name: Not on file   Number of children: Not on file   Years of education: Not on file   Highest education level: Not on file  Occupational History   Not on file  Tobacco Use   Smoking status: Never   Smokeless tobacco: Never  Vaping Use   Vaping Use: Never used  Substance and Sexual Activity   Alcohol use: Not Currently    Comment: occ.   Drug use: No   Sexual activity: Not on file    Comment: monogamous female partner for 5 years (2010)  Other Topics Concern   Not on file  Social History Narrative   Not on file   Social Determinants of Health   Financial Resource Strain: High Risk (05/28/2021)   Overall Financial Resource Strain (CARDIA)     Difficulty of Paying Living Expenses: Very hard  Food Insecurity: Food Insecurity Present (05/28/2021)   Hunger Vital Sign    Worried About Running Out of Food in the Last Year: Often true    Ran Out of Food in the Last Year: Often true  Transportation Needs: No Transportation Needs (03/20/2021)   PRAPARE - Administrator, Civil Service (Medical): No    Lack of Transportation (Non-Medical): No  Physical Activity: Not on file  Stress: Not on file  Social Connections: Not on file  Intimate Partner Violence: Not on file    Outpatient Medications Prior to Visit  Medication Sig Dispense Refill   acetaminophen (TYLENOL) 500 MG tablet Take 500 mg by mouth every 6 (six) hours as needed for mild pain or fever.     atorvastatin (LIPITOR) 20 MG tablet Take 1 tablet (20 mg total) by mouth daily. 90 tablet 2   clobetasol cream (TEMOVATE) 0.05 % Apply 1 Application topically 2 (two) times daily. 60 g 1   furosemide (LASIX) 20 MG tablet Take 1 daily as needed for swelling 30 tablet 3   haloperidol (HALDOL) 1 MG tablet Take 1 tablet (1 mg total) by mouth at bedtime. 30 tablet 2   levocetirizine (XYZAL) 5 MG tablet Take 1 tablet (5 mg total) by mouth every evening. 90 tablet 1   meloxicam (MOBIC) 15 MG tablet Take 15 mg by mouth daily.     norethindrone (AYGESTIN) 5 MG tablet Take 1 tablet (5 mg total) by mouth daily. 90 tablet 2   valsartan-hydrochlorothiazide (DIOVAN-HCT) 320-25 MG tablet Take 1 tablet by mouth daily. 90 tablet 3   Vitamin D, Ergocalciferol, (DRISDOL) 1.25 MG (50000 UNIT) CAPS capsule Take 1 capsule (50,000 Units total) by mouth every 7 (seven) days on Wednesday 16 capsule 4   No facility-administered medications prior to visit.    Allergies  Allergen Reactions   Other Rash    Blood pressure pill (unknown name)    ROS Review of Systems  Constitutional: Negative.  Negative for fatigue.  HENT:  Negative for ear pain, postnasal drip, rhinorrhea, sinus pressure, sore  throat, trouble swallowing and voice change.   Eyes: Negative.  Negative for visual disturbance.  Respiratory: Negative.  Negative for apnea, cough, choking, chest tightness, shortness of breath, wheezing and stridor.   Cardiovascular:  Positive for leg swelling. Negative for chest  pain and palpitations.  Gastrointestinal: Negative.  Negative for abdominal distention, abdominal pain, nausea and vomiting.  Genitourinary: Negative.  Negative for dysuria, pelvic pain, vaginal bleeding and vaginal discharge.  Musculoskeletal:  Positive for joint swelling. Negative for arthralgias, back pain and myalgias.  Skin:  Positive for rash.       Acne has resolved  Allergic/Immunologic: Negative.  Negative for environmental allergies and food allergies.  Neurological: Negative.  Negative for dizziness, syncope, weakness and headaches.  Hematological: Negative.  Negative for adenopathy. Does not bruise/bleed easily.  Psychiatric/Behavioral: Negative.  Negative for agitation and sleep disturbance. The patient is not nervous/anxious.       Objective:    Physical Exam Constitutional:      Appearance: Normal appearance.  HENT:     Head: Normocephalic and atraumatic.     Mouth/Throat:     Mouth: Mucous membranes are moist.     Dentition: Normal dentition (dentition fair).     Pharynx: Oropharynx is clear. Uvula midline.  Cardiovascular:     Rate and Rhythm: Normal rate and regular rhythm.     Heart sounds: Normal heart sounds. No murmur heard.    No friction rub. No gallop.  Pulmonary:     Effort: Pulmonary effort is normal.     Breath sounds: Normal breath sounds. No wheezing, rhonchi or rales.  Abdominal:     General: There is no distension.     Tenderness: There is no abdominal tenderness.  Musculoskeletal:     Right lower leg: Edema present.     Left lower leg: Edema present.  Skin:    General: Skin is warm and dry.     Findings: No rash.     Comments: Acne has resolved on the face hands  are very dry with mild eczematous rash  Neurological:     General: No focal deficit present.     Mental Status: She is alert.  Psychiatric:        Mood and Affect: Mood normal.        Behavior: Behavior normal.     LMP 09/12/2017 (LMP Unknown)  Wt Readings from Last 3 Encounters:  03/23/22 133 lb 3.2 oz (60.4 kg)  02/22/22 111 lb 15.9 oz (50.8 kg)  01/13/22 128 lb (58.1 kg)  MRI 02/2021 FINDINGS: COMBINED FINDINGS FOR BOTH MR ABDOMEN AND PELVIS   Study is significantly limited due to motion, uncooperative patient and lack of contrast.   Lower chest: Not visualized.   Hepatobiliary: Liver appears normal in size and contour with no suspicious mass identified. Gallbladder within normal limits. No biliary ductal dilatation identified.   Pancreas: Grossly normal with no mass or ductal dilatation visualized.   Spleen:  Within normal limits in size and appearance.   Adrenals/Urinary Tract: Adrenal glands appear within normal limits. Mild right hydronephrosis similar to previous CT. No definite suspicious renal mass identified.   Stomach/Bowel: No evidence of bowel obstruction.   Vascular/Lymphatic: No pathologically enlarged lymph nodes identified. No abdominal aortic aneurysm demonstrated.   Reproductive: Uterus measures approximately 11.3 x 16.3 x 24 cm in AP, transverse and craniocaudal dimensions. Several hypointense T2 signal likely uterine fibroids identified measuring up to 4.6 x 2.4 cm on the left. The endometrium is markedly distended with homogeneous hyperintense T1 and hyperintense T2 signal material, likely blood products, measuring approximally 10.6 x 13.7 x 22 cm in AP, transverse and craniocaudal dimensions. Indeterminate heterogeneity of the cervix measuring approximately 3 x 2.4 cm best seen on the sagittal T2 sequence.  Vaginal canal appears within normal limits.   Ovaries are not well visualized. Lobulated multicystic appearance of the bilateral  adnexa/ovaries measuring up to 5.3 x 3.8 cm in axial dimensions on the left and 4.7 x 3.7 cm on the right. Associated tubular/cystic components demonstrate hyperintense T1 and T2 signal.   Other:  No ascites visualized.   Musculoskeletal: No suspicious bony lesions identified.   IMPRESSION: 1. Hematometra with marked distension of the endometrial cavity and enlargement of the uterus, suggesting obstruction at the level of the cervix. There is an area of heterogeneity in the cervix which is indeterminate and not well evaluated due to motion/lack of contrast, recommend direct visualization/gynecologic evaluation. 2. Ovaries are not well evaluated. There is evidence of bilateral hematosalpinx and likely simple and hemorrhagic ovarian cysts, left greater than right. 3. No ascites or lymphadenopathy identified. 4. Uterine fibroids. 5. Mild right hydronephrosis similar to previous study, possibly secondary to uterine mass effect on the distal ureter  Health Maintenance Due  Topic Date Due   COLON CANCER SCREENING ANNUAL FOBT  01/29/2022      There are no preventive care reminders to display for this patient.  Lab Results  Component Value Date   TSH 1.480 11/11/2021   Lab Results  Component Value Date   WBC 5.2 01/13/2022   HGB 13.1 01/13/2022   HCT 39.5 01/13/2022   MCV 80 01/13/2022   PLT 260 01/13/2022   Lab Results  Component Value Date   NA 140 01/13/2022   K 3.6 01/13/2022   CO2 21 01/13/2022   GLUCOSE 85 01/13/2022   BUN 9 01/13/2022   CREATININE 0.62 01/13/2022   BILITOT 0.5 05/28/2021   ALKPHOS 59 05/28/2021   AST 25 05/28/2021   ALT 25 05/28/2021   PROT 6.7 05/28/2021   ALBUMIN 4.3 05/28/2021   CALCIUM 9.9 01/13/2022   ANIONGAP 13 01/01/2021   EGFR 106 01/13/2022   Lab Results  Component Value Date   CHOL 164 05/28/2021   Lab Results  Component Value Date   HDL 69 05/28/2021   Lab Results  Component Value Date   LDLCALC 80 05/28/2021   Lab  Results  Component Value Date   TRIG 83 05/28/2021   Lab Results  Component Value Date   CHOLHDL 2.4 05/28/2021   Lab Results  Component Value Date   HGBA1C 5.5 01/24/2018      Assessment & Plan:   Problem List Items Addressed This Visit   None No orders of the defined types were placed in this encounter.   Follow-up: No follow-ups on file.    Shan Levans, MD

## 2022-06-23 ENCOUNTER — Encounter: Payer: Self-pay | Admitting: Critical Care Medicine

## 2022-06-23 ENCOUNTER — Ambulatory Visit: Payer: 59 | Attending: Critical Care Medicine | Admitting: Critical Care Medicine

## 2022-06-23 VITALS — BP 170/85 | HR 69 | Wt 135.6 lb

## 2022-06-23 DIAGNOSIS — Z1211 Encounter for screening for malignant neoplasm of colon: Secondary | ICD-10-CM

## 2022-06-23 DIAGNOSIS — I10 Essential (primary) hypertension: Secondary | ICD-10-CM | POA: Diagnosis not present

## 2022-06-23 MED ORDER — AMLODIPINE BESYLATE 5 MG PO TABS: 5.0000 mg | ORAL_TABLET | Freq: Every day | ORAL | 1 refills | Status: DC

## 2022-06-23 NOTE — Assessment & Plan Note (Addendum)
Plan to add amlodipine 5 mg daily continue valsartan HCT and furosemide  Patient will see clinical pharmacist short-term and will check metabolic panel  Bring back for nurse visit 2 weeks for blood pressure check

## 2022-06-23 NOTE — Assessment & Plan Note (Signed)
Reissue colon cancer screening

## 2022-06-23 NOTE — Patient Instructions (Signed)
Start amlodipine 1 pill daily sent down to our pharmacy Stay on valsartan HCT as you are taking 1 pill daily Colon cancer kit was given today please process and return Labs today include a metabolic panel to check your kidney function  No other medication changes  Return to Dr. Delford Field 2 months  Return 4 weeks for a blood pressure recheck by nursing

## 2022-06-24 LAB — BMP8+EGFR
BUN/Creatinine Ratio: 13 (ref 9–23)
BUN: 7 mg/dL (ref 6–24)
CO2: 26 mmol/L (ref 20–29)
Calcium: 9.3 mg/dL (ref 8.7–10.2)
Chloride: 102 mmol/L (ref 96–106)
Creatinine, Ser: 0.56 mg/dL — ABNORMAL LOW (ref 0.57–1.00)
Glucose: 76 mg/dL (ref 70–99)
Potassium: 4.5 mmol/L (ref 3.5–5.2)
Sodium: 140 mmol/L (ref 134–144)
eGFR: 108 mL/min/{1.73_m2} (ref >59)

## 2022-06-24 NOTE — Progress Notes (Signed)
Let pt know labs normal no medication changes

## 2022-06-30 ENCOUNTER — Telehealth: Payer: Self-pay

## 2022-06-30 NOTE — Telephone Encounter (Signed)
-----   Message from Storm Frisk, MD sent at 06/24/2022  5:45 AM EDT ----- Let pt know labs normal no medication changes

## 2022-06-30 NOTE — Telephone Encounter (Signed)
Pt was called and vm was left, Information has been sent to nurse pool.   Interpreter id#[email or web address]

## 2022-07-14 ENCOUNTER — Ambulatory Visit: Payer: 59

## 2022-08-13 ENCOUNTER — Ambulatory Visit (INDEPENDENT_AMBULATORY_CARE_PROVIDER_SITE_OTHER): Payer: 59 | Admitting: Physician Assistant

## 2022-08-13 DIAGNOSIS — F259 Schizoaffective disorder, unspecified: Secondary | ICD-10-CM | POA: Diagnosis not present

## 2022-08-13 MED ORDER — HALOPERIDOL 1 MG PO TABS: 1.0000 mg | ORAL_TABLET | Freq: Every day | ORAL | 2 refills | Status: DC

## 2022-08-16 ENCOUNTER — Encounter (HOSPITAL_COMMUNITY): Payer: Self-pay | Admitting: Physician Assistant

## 2022-08-16 NOTE — Progress Notes (Signed)
BH MD/PA/NP OP Progress Note  08/16/2022 9:49 AM Cindy Garrison  MRN:  [id number]  Chief Complaint:  Chief Complaint  Patient presents with   Follow-up   Medication Refill   HPI:   Cindy Garrison is a 52 year old, African-American female with a past psychiatric history significant for schizoaffective disorder (unspecified type) who presents to [facility] for follow-up and medication management.  Language interpreter services were utilized during the duration of the encounter.  Patient is currently being managed on the following medication: Haldol 1 mg at bedtime.  Patient reports no issues or concerns regarding her current medication regimen.  Patient denies experiencing any adverse side effects and further denies the need for dosage adjustments at this time.  Patient denies depressive symptoms or anxiety and further denies any new stressors at this time.  Patient denies experiencing any auditory or visual hallucinations.  Patient endorses stability on her current use of Haldol.  Patient is alert and oriented x 4, calm, cooperative, and fully engaged in conversation during the encounter.  Patient describes her mood as good.  Patient denies suicidal or homicidal ideation.  She further denies auditory or visual hallucinations and does not appear to be responding to internal/external stimuli.  Patient endorses good sleep.  Patient endorses good appetite and eats on average 3 meals per day.  Patient denies alcohol consumption, tobacco use, or illicit drug use.  Visit Diagnosis:    ICD-10-CM   1. Schizoaffective disorder, unspecified type (HCC)  F25.9 haloperidol (HALDOL) 1 MG tablet      Past Psychiatric History:  Schizoaffective disorder.  Patient has been on Haldol for 10 years.  Prior to establishing care with [facility], patient was being seen at [facility]   Past Medical History:  Past Medical History:  Diagnosis Date    #[id number]    Acne    Back pain    Essential hypertension    Seasonal allergies    Syncope 09/16/2014   Syncope and collapse 09/16/2014    Past Surgical History:  Procedure Laterality Date   DILITATION & CURRETTAGE/HYSTROSCOPY WITH NOVASURE ABLATION N/A 12/22/2017   Procedure: DILATATION & CURETTAGE/ HYSTEROSCOPY WITH ENDOMETRIAL FIBROID AND  NOVASURE ABLATION;  Surgeon: Willodean Rosenthal, MD;  Location: [facility];  Service: Gynecology;  Laterality: N/A;    Family Psychiatric History:  Patient denies  Family History:  Family History  Problem Relation Age of Onset   Breast cancer Sister     Social History:  Social History   Socioeconomic History   Marital status: Married    Spouse name: Not on file   Number of children: Not on file   Years of education: Not on file   Highest education level: Not on file  Occupational History   Not on file  Tobacco Use   Smoking status: Never   Smokeless tobacco: Never  Vaping Use   Vaping status: Never Used  Substance and Sexual Activity   Alcohol use: Not Currently    Comment: occ.   Drug use: No   Sexual activity: Not on file    Comment: monogamous female partner for 5 years (2010)  Other Topics Concern   Not on file  Social History Narrative   Not on file   Social Determinants of Health   Financial Resource Strain: High Risk (05/28/2021)   Overall Financial Resource Strain (CARDIA)    Difficulty of Paying Living Expenses: Very hard  Food Insecurity: Food Insecurity Present (05/28/2021)   Hunger  Vital Sign    Worried About Programme researcher, broadcasting/film/video in the Last Year: Often true    Ran Out of Food in the Last Year: Often true  Transportation Needs: No Transportation Needs (03/20/2021)   PRAPARE - Administrator, Civil Service (Medical): No    Lack of Transportation (Non-Medical): No  Physical Activity: Not on file  Stress: Not on file  Social Connections: Not on file    Allergies:  Allergies  Allergen Reactions    Other Rash    Blood pressure pill (unknown name)    Metabolic Disorder Labs: Lab Results  Component Value Date   HGBA1C 5.5 01/24/2018   No results found for: "PROLACTIN" Lab Results  Component Value Date   CHOL 164 05/28/2021   TRIG 83 05/28/2021   HDL 69 05/28/2021   CHOLHDL 2.4 05/28/2021   LDLCALC 80 05/28/2021   LDLCALC 90 01/23/2020   Lab Results  Component Value Date   TSH 1.480 11/11/2021   TSH 1.260 06/20/2019    Therapeutic Level Labs: No results found for: "LITHIUM" No results found for: "VALPROATE" No results found for: "CBMZ"  Current Medications: Current Outpatient Medications  Medication Sig Dispense Refill   acetaminophen (TYLENOL) 500 MG tablet Take 500 mg by mouth every 6 (six) hours as needed for mild pain or fever.     amLODipine (NORVASC) 5 MG tablet Take 1 tablet (5 mg total) by mouth daily. 90 tablet 1   atorvastatin (LIPITOR) 20 MG tablet Take 1 tablet (20 mg total) by mouth daily. 90 tablet 2   clobetasol cream (TEMOVATE) 0.05 % Apply 1 Application topically 2 (two) times daily. 60 g 1   furosemide (LASIX) 20 MG tablet Take 1 daily as needed for swelling 30 tablet 3   haloperidol (HALDOL) 1 MG tablet Take 1 tablet (1 mg total) by mouth at bedtime. 30 tablet 2   levocetirizine (XYZAL) 5 MG tablet Take 1 tablet (5 mg total) by mouth every evening. 90 tablet 1   meloxicam (MOBIC) 15 MG tablet Take 15 mg by mouth daily.     norethindrone (AYGESTIN) 5 MG tablet Take 1 tablet (5 mg total) by mouth daily. 90 tablet 2   valsartan-hydrochlorothiazide (DIOVAN-HCT) 320-25 MG tablet Take 1 tablet by mouth daily. 90 tablet 3   Vitamin D, Ergocalciferol, (DRISDOL) 1.25 MG (50000 UNIT) CAPS capsule Take 1 capsule (50,000 Units total) by mouth every 7 (seven) days on Wednesday 16 capsule 4   No current facility-administered medications for this visit.     Musculoskeletal: Strength & Muscle Tone: within normal limits Gait & Station: normal Patient leans:  N/A  Psychiatric Specialty Exam: Review of Systems  Psychiatric/Behavioral:  Negative for decreased concentration, dysphoric mood, hallucinations, self-injury, sleep disturbance and suicidal ideas. The patient is not nervous/anxious and is not hyperactive.     Blood pressure (!) 198/100, pulse 72, weight 135 lb (61.2 kg), last menstrual period 09/12/2017, SpO2 100%.Body mass index is 29.21 kg/m.  General Appearance: Casual  Eye Contact:  Fair  Speech:  Clear and Coherent and Normal Rate  Volume:  Normal  Mood:  Euthymic  Affect:  Appropriate  Thought Process:  Coherent, Goal Directed, and Descriptions of Associations: Intact  Orientation:  Full (Time, Place, and Person)  Thought Content: WDL   Suicidal Thoughts:  No  Homicidal Thoughts:  No  Memory:  Immediate;   Good Recent;   Good Remote;   Good  Judgement:  Good  Insight:  Good  Psychomotor Activity:  Normal  Concentration:  Concentration: Good and Attention Span: Good  Recall:  Good  Fund of Knowledge: Good  Language: Good  Akathisia:  No  Handed:  Right  AIMS (if indicated): not done  Assets:  Communication Skills Desire for Improvement Financial Resources/Insurance Housing Intimacy Social Support  ADL's:  Intact  Cognition: WNL  Sleep:  Good   Screenings: AIMS    Flowsheet Row Clinical Support from 01/22/2022 in [facility] Office Visit from 04/01/2020 in [facility]  AIMS Total Score 2 0      GAD-7    Flowsheet Row Clinical Support from 08/13/2022 in [facility] Clinical Support from 06/18/2022 in [facility] Clinical Support from 03/19/2022 in [facility] Clinical Support from 01/22/2022 in [facility] Office Visit from 01/13/2022 in [facility]  Total GAD-7 Score 0 0 0 1 3      PHQ2-9    Flowsheet Row  Clinical Support from 08/13/2022 in [facility] Clinical Support from 06/18/2022 in [facility] Clinical Support from 03/19/2022 in [facility] Clinical Support from 01/22/2022 in [facility] Office Visit from 01/13/2022 in [facility]  PHQ-2 Total Score 0 0 0 0 3  PHQ-9 Total Score -- -- -- -- 3      Flowsheet Row Clinical Support from 08/13/2022 in [facility] Clinical Support from 06/18/2022 in [facility] Clinical Support from 03/19/2022 in [facility]  C-SSRS RISK CATEGORY No Risk No Risk No Risk        Assessment and Plan:   Cindy Garrison is a 52 year old, African-American female with a past psychiatric history significant for schizoaffective disorder (unspecified type) who presents to [facility] for follow-up and medication management.  Language interpreter services were utilized during the duration of the encounter.  Patient reports no issues or concerns regarding her current medication use.  Patient denies experiencing any adverse side effects and further denies the need for dosage adjustments at this time.  Patient denies depressive symptoms, anxiety, or auditory/visual hallucinations.  Patient endorses stability from the use of her Haldol.  Patient to continue taking her medication as prescribed.  Patient's medication to be e-prescribed to pharmacy of choice.  Collaboration of Care: Collaboration of Care: Medication Management AEB provider managing patient's psychiatric medications, Primary Care Provider AEB patient being seen by a primary care provider, and Psychiatrist AEB patient being followed by mental health provider at this facility  Patient/Guardian was advised Release of Information must be obtained prior to  any record release in order to collaborate their care with an outside provider. Patient/Guardian was advised if they have not already done so to contact the registration department to sign all necessary forms in order for [place] to release information regarding their care.   Consent: Patient/Guardian gives verbal consent for treatment and assignment of benefits for services provided during this visit. Patient/Guardian expressed understanding and agreed to proceed.   1. Schizoaffective disorder, unspecified type (HCC)  - haloperidol (HALDOL) 1 MG tablet; Take 1 tablet (1 mg total) by mouth at bedtime.  Dispense: 30 tablet; Refill: 2  Patient to follow-up in 2 months Provider spent a total of 15 minutes with the patient/reviewing patient's chart  Stephens Shire  E [place] Chelf, PA 08/16/2022, 9:49 AM

## 2022-08-22 NOTE — Progress Notes (Deleted)
Established Patient Office Visit  Subjective:  Patient ID: Cindy Garrison, female    DOB: Jul 06, 1970  Age: 52 y.o. MRN: [id number]  CC:  No chief complaint on file.   HPI 01/2021 Ysidro Evert Halfhill presents for follow up after ED visit and for hypertension follow up. [place] interpretation services were used, with [place], [phone].   The patient visited the ED for the ongoing issue of lower abdominal pain. This is attributed to the uterine leiomyoma She states that the pain has actually improved recently. The pain is bilateral and she notices it on her left and right sides. She admits some vaginal pain that she has noticed when wiping after urinating. She denies any vaginal discharge or bleeding. An MRI was ordered by gynecology (Dr. Donavan Foil) to assess the uterine leiomyoma, with plans for surgery. The patient is unsure of what the MRI is and has not been able to have the imaging done yet. She would appreciate assistance with scheduling this scan. It is vital that this scan be completed.  Patient has diagnosis of schizoaffective disorder, for which she takes haloperidol (Haldol). She has established care with behavioral health, Toy Cookey, NP.  Since her last visit here, she has been able to get a screening mammogram which was negative. She does need a colon cancer screening.   The patient's blood pressure is at therapeutic goal today. She has been confused regarding the dosage of the Valsartan-HCTZ, but should now be taking the higher dose as prescribed last time. She has reported some dizziness since last visit, but denies any palpitations. She has no marked edema.   05/28/2021 Patient seen in return follow-up and this visit was assisted by video interpreter Latifah for [place] [phone] The patient has been seen by gynecology's on several occasions since I last saw her in January.  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  This is the cause of her pelvic pain.  She was begun on  Orilissa to see if this would cause decreased uterine bleeding and facilitate absorption of the hematometra The patient took about a pack and a half of the medication given to her and has not been able to complete she had excess sweating and increased acne on the face and the gluteal area.  She also had other side effects she did not like.  Patient has return visit June 1 with Dr. Shawnie Pons to discuss further options. Patient also has fallen twice at work and on arrival blood pressure is 115/79.  We had recently increased her blood pressure medicine to add valsartan HCT along with amlodipine and I think this may be too much medication for this patient but tickly with the ongoing issues in the uterus.  Patient also has increased allergies and picked up a box of Allegra-D I indicated to her that she should not be taking any allergy medicines with the components.  We can get her prescription version of Zyrtec.  There are no other complaints.  MRI done in February has been reviewed and documented as below  Patient complains of allergic rhinitis worse with the increased pollen were currently experiencing she picked up an Allegra-D from the pharmacy and I told her to stop this because of the D component  7/18 Patient seen in return follow-up this visit was assisted by [place] interpreter Cira Servant [phone] Patient is in good spirits on arrival states her bleeding has significantly improved as has her pelvic pain on the current dose of norethindrone  On arrival blood pressure  is good 134/70.  Patient is having continued allergies she did not get her cetirizine filled.  She also complains of bilateral upper and lower tooth pain.  There are no other complaints.  Patient states her dizziness and frequent falls have stopped  12/27 Patient is seen in return follow-up and is seen with the e[place] in person interpreter Cedric.  The patient has history of hypertension, chronic severe iron deficiency from bleeding in the  uterus, allergic rhinitis, low back pain, visual changes.  Blood pressure was elevated 171/63.  Is very difficult to carry on this encounter because of language barrier.  She brings a bag of medications most of which she is currently not taking.  She is supposed to be on norethindrone for bleeding in the uterus she has not been taking this.  She also supposed be on amlodipine for blood pressure she is not taking this as well.  She complains of dryness in the hands and pain in the feet. Below is documentation from her visit in October [facility] 11/11/21  ED 12/15 for foot pain  Saw GYN 10/20 and continues Aygestin for hematometra no surgery needed  1. Allergic rhinitis due to pollen, unspecified seasonality D/c zyrtec - levocetirizine (XYZAL) 5 MG tablet; Take 1 tablet (5 mg total) by mouth every evening.  Dispense: 90 tablet; Refill: 1   2. Dry skin On ears and feet-currently being treated for tinea pedis and otitis externa but I am wondering if she may have seborrhea or psoriasis - Ambulatory referral to derm - levocetirizine (XYZAL) 5 MG tablet; Take 1 tablet (5 mg total) by mouth every evening.  Dispense: 90 tablet; Refill: 1   3. Vision changes Wants to have prescription safety glasses made - Ambulatory referral to Ophthalmology   4. Edema, unspecified type Patient information given  - Basic metabolic panel - TSH - furosemide (LASIX) 20 MG tablet; Take 1 daily for 3 days as needed for swelling (use sparingly)  Dispense: 30 tablet; Refill: 3   5. Essential hypertension Continue valsartan/HCT - Basic metabolic panel   6. Hospital discharge follow-up     Patient did not want to use french interpreter.  We were unable to get a [place] interpreter on the spot.  I understand minimal french and she had things written down that I was able to read in [place] that is very similar to written french.  All issues addressed in English/written french/[place].    Note the patient prefers e[place] language and  does not understand [place] well enough.  She is contemplating trying to declare disability but wants to continue to work we explained her this is not possible.  03/23/22 This patient seen in return follow-up as an assisted by in person interpreter Cedric for [place] Recent mammogram was unremarkable.  Patient was in the ER in February for edema of the lower extremity she had her amlodipine held. On arrival blood pressure is elevated 169/77.  The patient's been holding all blood pressure medicines.  There are no other complaints.  She is followed by gynecology maintains oral birth control.  06/22/22 Patient seen return follow-up for hypertension.  Patient has schizoaffective disorder followed by psychiatry and is stable on her chronic medication Haldol.  Patient also is improved with regards to her pelvic pain on the norethindrone per gynecology.  On arrival blood pressure is quite elevated but recheck 170/85 it persists.  Patient maintains valsartan HCT at maximum dose  8/6  Past Medical History:  Diagnosis Date   [phone]  Acne    Back pain    Essential hypertension    Seasonal allergies    Syncope 09/16/2014   Syncope and collapse 09/16/2014    Past Surgical History:  Procedure Laterality Date   DILITATION & CURRETTAGE/HYSTROSCOPY WITH NOVASURE ABLATION N/A 12/22/2017   Procedure: DILATATION & CURETTAGE/ HYSTEROSCOPY WITH ENDOMETRIAL FIBROID AND  NOVASURE ABLATION;  Surgeon: Willodean Rosenthal, MD;  Location: [facility];  Service: Gynecology;  Laterality: N/A;    Family History  Problem Relation Age of Onset   Breast cancer Sister     Social History   Socioeconomic History   Marital status: Married    Spouse name: Not on file   Number of children: Not on file   Years of education: Not on file   Highest education level: Not on file  Occupational History   Not on file  Tobacco Use   Smoking status: Never   Smokeless tobacco: Never  Vaping Use   Vaping status: Never Used   Substance and Sexual Activity   Alcohol use: Not Currently    Comment: occ.   Drug use: No   Sexual activity: Not on file    Comment: monogamous female partner for 5 years (2010)  Other Topics Concern   Not on file  Social History Narrative   Not on file   Social Determinants of Health   Financial Resource Strain: High Risk (05/28/2021)   Overall Financial Resource Strain (CARDIA)    Difficulty of Paying Living Expenses: Very hard  Food Insecurity: Food Insecurity Present (05/28/2021)   Hunger Vital Sign    Worried About Running Out of Food in the Last Year: Often true    Ran Out of Food in the Last Year: Often true  Transportation Needs: No Transportation Needs (03/20/2021)   PRAPARE - Administrator, Civil Service (Medical): No    Lack of Transportation (Non-Medical): No  Physical Activity: Not on file  Stress: Not on file  Social Connections: Not on file  Intimate Partner Violence: Not on file    Outpatient Medications Prior to Visit  Medication Sig Dispense Refill   acetaminophen (TYLENOL) 500 MG tablet Take 500 mg by mouth every 6 (six) hours as needed for mild pain or fever.     amLODipine (NORVASC) 5 MG tablet Take 1 tablet (5 mg total) by mouth daily. 90 tablet 1   atorvastatin (LIPITOR) 20 MG tablet Take 1 tablet (20 mg total) by mouth daily. 90 tablet 2   clobetasol cream (TEMOVATE) 0.05 % Apply 1 Application topically 2 (two) times daily. 60 g 1   furosemide (LASIX) 20 MG tablet Take 1 daily as needed for swelling 30 tablet 3   haloperidol (HALDOL) 1 MG tablet Take 1 tablet (1 mg total) by mouth at bedtime. 30 tablet 2   levocetirizine (XYZAL) 5 MG tablet Take 1 tablet (5 mg total) by mouth every evening. 90 tablet 1   meloxicam (MOBIC) 15 MG tablet Take 15 mg by mouth daily.     norethindrone (AYGESTIN) 5 MG tablet Take 1 tablet (5 mg total) by mouth daily. 90 tablet 2   valsartan-hydrochlorothiazide (DIOVAN-HCT) 320-25 MG tablet Take 1 tablet by mouth  daily. 90 tablet 3   Vitamin D, Ergocalciferol, (DRISDOL) 1.25 MG (50000 UNIT) CAPS capsule Take 1 capsule (50,000 Units total) by mouth every 7 (seven) days on Wednesday 16 capsule 4   No facility-administered medications prior to visit.    Allergies  Allergen Reactions   Other Rash  Blood pressure pill (unknown name)    ROS Review of Systems  Constitutional: Negative.  Negative for fatigue.  HENT:  Negative for ear pain, postnasal drip, rhinorrhea, sinus pressure, sore throat, trouble swallowing and voice change.   Eyes: Negative.  Negative for visual disturbance.  Respiratory: Negative.  Negative for apnea, cough, choking, chest tightness, shortness of breath, wheezing and stridor.   Cardiovascular:  Negative for chest pain, palpitations and leg swelling.  Gastrointestinal: Negative.  Negative for abdominal distention, abdominal pain, nausea and vomiting.  Genitourinary: Negative.  Negative for dysuria, pelvic pain, vaginal bleeding and vaginal discharge.  Musculoskeletal:  Positive for joint swelling. Negative for arthralgias, back pain and myalgias.  Skin:  Negative for rash.       Acne has resolved  Allergic/Immunologic: Negative.  Negative for environmental allergies and food allergies.  Neurological: Negative.  Negative for dizziness, syncope, weakness and headaches.  Hematological: Negative.  Negative for adenopathy. Does not bruise/bleed easily.  Psychiatric/Behavioral: Negative.  Negative for agitation and sleep disturbance. The patient is not nervous/anxious.       Objective:    Physical Exam Constitutional:      Appearance: Normal appearance.  HENT:     Head: Normocephalic and atraumatic.     Mouth/Throat:     Mouth: Mucous membranes are moist.     Dentition: Normal dentition (dentition fair).     Pharynx: Oropharynx is clear. Uvula midline.  Cardiovascular:     Rate and Rhythm: Normal rate and regular rhythm.     Heart sounds: Normal heart sounds. No murmur  heard.    No friction rub. No gallop.  Pulmonary:     Effort: Pulmonary effort is normal.     Breath sounds: Normal breath sounds. No wheezing, rhonchi or rales.  Abdominal:     General: There is no distension.     Tenderness: There is no abdominal tenderness.  Musculoskeletal:     Right lower leg: No edema.     Left lower leg: No edema.  Skin:    General: Skin is warm and dry.     Findings: No rash.     Comments: Acne has resolved on the face hands are very dry with mild eczematous rash  Neurological:     General: No focal deficit present.     Mental Status: She is alert.  Psychiatric:        Mood and Affect: Mood normal.        Behavior: Behavior normal.     LMP 09/12/2017 (LMP Unknown)  Wt Readings from Last 3 Encounters:  06/23/22 135 lb 9.6 oz (61.5 kg)  03/23/22 133 lb 3.2 oz (60.4 kg)  02/22/22 111 lb 15.9 oz (50.8 kg)  MRI 02/2021 FINDINGS: COMBINED FINDINGS FOR BOTH MR ABDOMEN AND PELVIS   Study is significantly limited due to motion, uncooperative patient and lack of contrast.   Lower chest: Not visualized.   Hepatobiliary: Liver appears normal in size and contour with no suspicious mass identified. Gallbladder within normal limits. No biliary ductal dilatation identified.   Pancreas: Grossly normal with no mass or ductal dilatation visualized.   Spleen:  Within normal limits in size and appearance.   Adrenals/Urinary Tract: Adrenal glands appear within normal limits. Mild right hydronephrosis similar to previous CT. No definite suspicious renal mass identified.   Stomach/Bowel: No evidence of bowel obstruction.   Vascular/Lymphatic: No pathologically enlarged lymph nodes identified. No abdominal aortic aneurysm demonstrated.   Reproductive: Uterus measures approximately 11.3 x 16.3 x  24 cm in AP, transverse and craniocaudal dimensions. Several hypointense T2 signal likely uterine fibroids identified measuring up to 4.6 x 2.4 cm on the left. The  endometrium is markedly distended with homogeneous hyperintense T1 and hyperintense T2 signal material, likely blood products, measuring approximally 10.6 x 13.7 x 22 cm in AP, transverse and craniocaudal dimensions. Indeterminate heterogeneity of the cervix measuring approximately 3 x 2.4 cm best seen on the sagittal T2 sequence. Vaginal canal appears within normal limits.   Ovaries are not well visualized. Lobulated multicystic appearance of the bilateral adnexa/ovaries measuring up to 5.3 x 3.8 cm in axial dimensions on the left and 4.7 x 3.7 cm on the right. Associated tubular/cystic components demonstrate hyperintense T1 and T2 signal.   Other:  No ascites visualized.   Musculoskeletal: No suspicious bony lesions identified.   IMPRESSION: 1. Hematometra with marked distension of the endometrial cavity and enlargement of the uterus, suggesting obstruction at the level of the cervix. There is an area of heterogeneity in the cervix which is indeterminate and not well evaluated due to motion/lack of contrast, recommend direct visualization/gynecologic evaluation. 2. Ovaries are not well evaluated. There is evidence of bilateral hematosalpinx and likely simple and hemorrhagic ovarian cysts, left greater than right. 3. No ascites or lymphadenopathy identified. 4. Uterine fibroids. 5. Mild right hydronephrosis similar to previous study, possibly secondary to uterine mass effect on the distal ureter  Health Maintenance Due  Topic Date Due   COLON CANCER SCREENING ANNUAL FOBT  01/29/2022   INFLUENZA VACCINE  08/19/2022      There are no preventive care reminders to display for this patient.  Lab Results  Component Value Date   TSH 1.480 11/11/2021   Lab Results  Component Value Date   WBC 5.2 01/13/2022   HGB 13.1 01/13/2022   HCT 39.5 01/13/2022   MCV 80 01/13/2022   PLT 260 01/13/2022   Lab Results  Component Value Date   NA 140 06/23/2022   K 4.5 06/23/2022    CO2 26 06/23/2022   GLUCOSE 76 06/23/2022   BUN 7 06/23/2022   CREATININE 0.56 (L) 06/23/2022   BILITOT 0.5 05/28/2021   ALKPHOS 59 05/28/2021   AST 25 05/28/2021   ALT 25 05/28/2021   PROT 6.7 05/28/2021   ALBUMIN 4.3 05/28/2021   CALCIUM 9.3 06/23/2022   ANIONGAP 13 01/01/2021   EGFR 108 06/23/2022   Lab Results  Component Value Date   CHOL 164 05/28/2021   Lab Results  Component Value Date   HDL 69 05/28/2021   Lab Results  Component Value Date   LDLCALC 80 05/28/2021   Lab Results  Component Value Date   TRIG 83 05/28/2021   Lab Results  Component Value Date   CHOLHDL 2.4 05/28/2021   Lab Results  Component Value Date   HGBA1C 5.5 01/24/2018      Assessment & Plan:   Problem List Items Addressed This Visit   None No orders of the defined types were placed in this encounter.   Follow-up: No follow-ups on file.    Shan Levans, MD

## 2022-08-24 ENCOUNTER — Ambulatory Visit: Payer: Self-pay | Admitting: Critical Care Medicine

## 2022-08-27 ENCOUNTER — Ambulatory Visit: Payer: 59 | Attending: Critical Care Medicine | Admitting: Critical Care Medicine

## 2022-08-27 ENCOUNTER — Encounter: Payer: Self-pay | Admitting: Critical Care Medicine

## 2022-08-27 VITALS — BP 191/95 | HR 75 | Temp 98.1°F | Ht <= 58 in | Wt 136.0 lb

## 2022-08-27 DIAGNOSIS — E7800 Pure hypercholesterolemia, unspecified: Secondary | ICD-10-CM | POA: Diagnosis not present

## 2022-08-27 DIAGNOSIS — J301 Allergic rhinitis due to pollen: Secondary | ICD-10-CM | POA: Diagnosis not present

## 2022-08-27 DIAGNOSIS — L853 Xerosis cutis: Secondary | ICD-10-CM

## 2022-08-27 DIAGNOSIS — N857 Hematometra: Secondary | ICD-10-CM | POA: Diagnosis not present

## 2022-08-27 DIAGNOSIS — I10 Essential (primary) hypertension: Secondary | ICD-10-CM | POA: Diagnosis not present

## 2022-08-27 DIAGNOSIS — F259 Schizoaffective disorder, unspecified: Secondary | ICD-10-CM

## 2022-08-27 DIAGNOSIS — R609 Edema, unspecified: Secondary | ICD-10-CM

## 2022-08-27 MED ORDER — CLOBETASOL PROPIONATE 0.05 % EX CREA: 1.0000 | TOPICAL_CREAM | Freq: Two times a day (BID) | CUTANEOUS | 1 refills | Status: DC

## 2022-08-27 MED ORDER — VALSARTAN-HYDROCHLOROTHIAZIDE 320-25 MG PO TABS: 1.0000 | ORAL_TABLET | Freq: Every day | ORAL | 3 refills | Status: DC

## 2022-08-27 MED ORDER — NORETHINDRONE ACETATE 5 MG PO TABS: 5.0000 mg | ORAL_TABLET | Freq: Every day | ORAL | 2 refills | Status: DC

## 2022-08-27 MED ORDER — AMLODIPINE BESYLATE 10 MG PO TABS: 10.0000 mg | ORAL_TABLET | Freq: Every day | ORAL | 1 refills | Status: DC

## 2022-08-27 MED ORDER — VITAMIN D (ERGOCALCIFEROL) 1.25 MG (50000 UNIT) PO CAPS: 50000.0000 [IU] | ORAL_CAPSULE | ORAL | 4 refills | Status: DC

## 2022-08-27 MED ORDER — LEVOCETIRIZINE DIHYDROCHLORIDE 5 MG PO TABS: 5.0000 mg | ORAL_TABLET | Freq: Every evening | ORAL | 1 refills | Status: DC

## 2022-08-27 MED ORDER — HALOPERIDOL 1 MG PO TABS: 1.0000 mg | ORAL_TABLET | Freq: Every day | ORAL | 2 refills | Status: DC

## 2022-08-27 MED ORDER — MELOXICAM 15 MG PO TABS: 15.0000 mg | ORAL_TABLET | Freq: Every day | ORAL | 1 refills | Status: DC

## 2022-08-27 MED ORDER — FUROSEMIDE 20 MG PO TABS: ORAL_TABLET | ORAL | 3 refills | Status: DC

## 2022-08-27 MED ORDER — ATORVASTATIN CALCIUM 20 MG PO TABS: 20.0000 mg | ORAL_TABLET | Freq: Every day | ORAL | 2 refills | Status: DC

## 2022-08-27 MED ORDER — CARVEDILOL 12.5 MG PO TABS: 12.5000 mg | ORAL_TABLET | Freq: Two times a day (BID) | ORAL | 3 refills | Status: DC

## 2022-08-27 NOTE — Assessment & Plan Note (Signed)
Continue atorvastatin

## 2022-08-27 NOTE — Assessment & Plan Note (Signed)
Resolved due to use of Norethidrone

## 2022-08-27 NOTE — Assessment & Plan Note (Signed)
Uncontrolled due to not taking amlodipine we will continue valsartan HCT 320/25 resume amlodipine at 10 mg daily and begin carvedilol 12.5 mg twice daily and check labs

## 2022-08-27 NOTE — Patient Instructions (Addendum)
All medications refilled Take amlodipine 10mg  one daily Stay on Valsartan HCT one daily Start carvedilol one two times daily All other medications the same See nurse two weeks for blood pressure recheck See Dr Delford Field 6 weeks   Towanda Octave atikeawo kat me No amlodipine 10mg  ?eka gbesiagbe N? Valsartan HCT ?eka gbesiagbe Dze carvedilol ?eka g?me zi eve gbesiagbe Atike bubuawo kat s? Kp? d?n?dzikp?la kwasi?a eve hena ?u fe sisi fe dodokp? ake Kp? ?kta Skeet Simmer?a 6

## 2022-08-27 NOTE — Assessment & Plan Note (Signed)
Refill Flonase and Zyrtec.

## 2022-08-27 NOTE — Assessment & Plan Note (Signed)
Continue with Haldol

## 2022-08-27 NOTE — Progress Notes (Signed)
Established Patient Office Visit  Subjective:  Patient ID: Elmon Else, female    DOB: 11/02/1970  Age: 51 y.o. MRN: [id number]  CC:  Chief Complaint  Patient presents with   Hypertension    HPI 01/2021 Ysidro Evert Chasse presents for follow up after ED visit and for hypertension follow up. [place] interpretation services were used, with [place], [phone].   The patient visited the ED for the ongoing issue of lower abdominal pain. This is attributed to the uterine leiomyoma She states that the pain has actually improved recently. The pain is bilateral and she notices it on her left and right sides. She admits some vaginal pain that she has noticed when wiping after urinating. She denies any vaginal discharge or bleeding. An MRI was ordered by gynecology (Dr. Donavan Foil) to assess the uterine leiomyoma, with plans for surgery. The patient is unsure of what the MRI is and has not been able to have the imaging done yet. She would appreciate assistance with scheduling this scan. It is vital that this scan be completed.  Patient has diagnosis of schizoaffective disorder, for which she takes haloperidol (Haldol). She has established care with behavioral health, Toy Cookey, NP.  Since her last visit here, she has been able to get a screening mammogram which was negative. She does need a colon cancer screening.   The patient's blood pressure is at therapeutic goal today. She has been confused regarding the dosage of the Valsartan-HCTZ, but should now be taking the higher dose as prescribed last time. She has reported some dizziness since last visit, but denies any palpitations. She has no marked edema.   05/28/2021 Patient seen in return follow-up and this visit was assisted by video interpreter Latifah for [place] [phone] The patient has been seen by gynecology's on several occasions since I last saw her in January.  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  This is the cause of her pelvic  pain.  She was begun on Orilissa to see if this would cause decreased uterine bleeding and facilitate absorption of the hematometra The patient took about a pack and a half of the medication given to her and has not been able to complete she had excess sweating and increased acne on the face and the gluteal area.  She also had other side effects she did not like.  Patient has return visit June 1 with Dr. Shawnie Pons to discuss further options. Patient also has fallen twice at work and on arrival blood pressure is 115/79.  We had recently increased her blood pressure medicine to add valsartan HCT along with amlodipine and I think this may be too much medication for this patient but tickly with the ongoing issues in the uterus.  Patient also has increased allergies and picked up a box of Allegra-D I indicated to her that she should not be taking any allergy medicines with the components.  We can get her prescription version of Zyrtec.  There are no other complaints.  MRI done in February has been reviewed and documented as below  Patient complains of allergic rhinitis worse with the increased pollen were currently experiencing she picked up an Allegra-D from the pharmacy and I told her to stop this because of the D component  7/18 Patient seen in return follow-up this visit was assisted by [place] interpreter Cira Servant [phone] Patient is in good spirits on arrival states her bleeding has significantly improved as has her pelvic pain on the current dose of norethindrone  On arrival blood pressure is good 134/70.  Patient is having continued allergies she did not get her cetirizine filled.  She also complains of bilateral upper and lower tooth pain.  There are no other complaints.  Patient states her dizziness and frequent falls have stopped  12/27 Patient is seen in return follow-up and is seen with the [place] in person interpreter Cedric.  The patient has history of hypertension, chronic severe iron deficiency  from bleeding in the uterus, allergic rhinitis, low back pain, visual changes.  Blood pressure was elevated 171/63.  Is very difficult to carry on this encounter because of language barrier.  She brings a bag of medications most of which she is currently not taking.  She is supposed to be on norethindrone for bleeding in the uterus she has not been taking this.  She also supposed be on amlodipine for blood pressure she is not taking this as well.  She complains of dryness in the hands and pain in the feet. Below is documentation from her visit in October saw [facility] 11/11/21  ED 12/15 for foot pain  Saw GYN 10/20 and continues Aygestin for hematometra no surgery needed  1. Allergic rhinitis due to pollen, unspecified seasonality D/c zyrtec - levocetirizine (XYZAL) 5 MG tablet; Take 1 tablet (5 mg total) by mouth every evening.  Dispense: 90 tablet; Refill: 1   2. Dry skin On ears and feet-currently being treated for tinea pedis and otitis externa but I am wondering if she may have seborrhea or psoriasis - Ambulatory referral to derm - levocetirizine (XYZAL) 5 MG tablet; Take 1 tablet (5 mg total) by mouth every evening.  Dispense: 90 tablet; Refill: 1   3. Vision changes Wants to have prescription safety glasses made - Ambulatory referral to Ophthalmology   4. Edema, unspecified type Patient information given  - Basic metabolic panel - TSH - furosemide (LASIX) 20 MG tablet; Take 1 daily for 3 days as needed for swelling (use sparingly)  Dispense: 30 tablet; Refill: 3   5. Essential hypertension Continue valsartan/HCT - Basic metabolic panel   6. Hospital discharge follow-up     Patient did not want to use french interpreter.  We were unable to get a [place] interpreter on the spot.  I understand minimal french and she had things written down that I was able to read in [place] that is very similar to written french.  All issues addressed in English/written french/[place].    Note the patient  prefers e[place] language and does not understand [place] well enough.  She is contemplating trying to declare disability but wants to continue to work we explained her this is not possible.  03/23/22 This patient seen in return follow-up as an assisted by in person interpreter Cedric for e[place] Recent mammogram was unremarkable.  Patient was in the ER in February for edema of the lower extremity she had her amlodipine held. On arrival blood pressure is elevated 169/77.  The patient's been holding all blood pressure medicines.  There are no other complaints.  She is followed by gynecology maintains oral birth control.  06/22/22 Patient seen return follow-up for hypertension.  Patient has schizoaffective disorder followed by psychiatry and is stable on her chronic medication Haldol.  Patient also is improved with regards to her pelvic pain on the norethindrone per gynecology.  On arrival blood pressure is quite elevated but recheck 170/85 it persists.  Patient maintains valsartan HCT at maximum dose  8/9 This patient seen in return for  follow-up assisted by in person [place] interpreter Cedric Patient has no complaints at this visit needs follow-up on multiple problems.  She has not been taking amlodipine only the valsartan HCT and on arrival blood pressure 191/95.  She is taking her gynecologic medication and her bleeding and uterus have resolved She needs multiple medication refills Past Medical History:  Diagnosis Date   #[id number]    Acne    Back pain    Essential hypertension    Seasonal allergies    Syncope 09/16/2014   Syncope and collapse 09/16/2014    Past Surgical History:  Procedure Laterality Date   DILITATION & CURRETTAGE/HYSTROSCOPY WITH NOVASURE ABLATION N/A 12/22/2017   Procedure: DILATATION & CURETTAGE/ HYSTEROSCOPY WITH ENDOMETRIAL FIBROID AND  NOVASURE ABLATION;  Surgeon: Willodean Rosenthal, MD;  Location: [facility];  Service: Gynecology;  Laterality: N/A;    Family History  Problem  Relation Age of Onset   Breast cancer Sister     Social History   Socioeconomic History   Marital status: Married    Spouse name: Not on file   Number of children: Not on file   Years of education: Not on file   Highest education level: Not on file  Occupational History   Not on file  Tobacco Use   Smoking status: Never   Smokeless tobacco: Never  Vaping Use   Vaping status: Never Used  Substance and Sexual Activity   Alcohol use: Not Currently    Comment: occ.   Drug use: No   Sexual activity: Not on file    Comment: monogamous female partner for 5 years (2010)  Other Topics Concern   Not on file  Social History Narrative   Not on file   Social Determinants of Health   Financial Resource Strain: High Risk (05/28/2021)   Overall Financial Resource Strain (CARDIA)    Difficulty of Paying Living Expenses: Very hard  Food Insecurity: Food Insecurity Present (05/28/2021)   Hunger Vital Sign    Worried About Running Out of Food in the Last Year: Often true    Ran Out of Food in the Last Year: Often true  Transportation Needs: No Transportation Needs (03/20/2021)   PRAPARE - Administrator, Civil Service (Medical): No    Lack of Transportation (Non-Medical): No  Physical Activity: Not on file  Stress: Not on file  Social Connections: Not on file  Intimate Partner Violence: Not on file    Outpatient Medications Prior to Visit  Medication Sig Dispense Refill   acetaminophen (TYLENOL) 500 MG tablet Take 500 mg by mouth every 6 (six) hours as needed for mild pain or fever.     amLODipine (NORVASC) 5 MG tablet Take 1 tablet (5 mg total) by mouth daily. 90 tablet 1   atorvastatin (LIPITOR) 20 MG tablet Take 1 tablet (20 mg total) by mouth daily. 90 tablet 2   clobetasol cream (TEMOVATE) 0.05 % Apply 1 Application topically 2 (two) times daily. 60 g 1   furosemide (LASIX) 20 MG tablet Take 1 daily as needed for swelling 30 tablet 3   haloperidol (HALDOL) 1 MG tablet  Take 1 tablet (1 mg total) by mouth at bedtime. 30 tablet 2   levocetirizine (XYZAL) 5 MG tablet Take 1 tablet (5 mg total) by mouth every evening. 90 tablet 1   meloxicam (MOBIC) 15 MG tablet Take 15 mg by mouth daily.     norethindrone (AYGESTIN) 5 MG tablet Take 1 tablet (5 mg total) by mouth  daily. 90 tablet 2   valsartan-hydrochlorothiazide (DIOVAN-HCT) 320-25 MG tablet Take 1 tablet by mouth daily. 90 tablet 3   Vitamin D, Ergocalciferol, (DRISDOL) 1.25 MG (50000 UNIT) CAPS capsule Take 1 capsule (50,000 Units total) by mouth every 7 (seven) days on Wednesday 16 capsule 4   No facility-administered medications prior to visit.    Allergies  Allergen Reactions   Other Rash    Blood pressure pill (unknown name)    ROS Review of Systems  Constitutional: Negative.  Negative for fatigue.  HENT:  Negative for ear pain, postnasal drip, rhinorrhea, sinus pressure, sore throat, trouble swallowing and voice change.   Eyes: Negative.  Negative for visual disturbance.  Respiratory: Negative.  Negative for apnea, cough, choking, chest tightness, shortness of breath, wheezing and stridor.   Cardiovascular:  Negative for chest pain, palpitations and leg swelling.  Gastrointestinal: Negative.  Negative for abdominal distention, abdominal pain, nausea and vomiting.  Genitourinary: Negative.  Negative for dysuria, pelvic pain, vaginal bleeding and vaginal discharge.  Musculoskeletal:  Positive for joint swelling. Negative for arthralgias, back pain and myalgias.  Skin:  Negative for rash.       Acne has resolved  Allergic/Immunologic: Negative.  Negative for environmental allergies and food allergies.  Neurological: Negative.  Negative for dizziness, syncope, weakness and headaches.  Hematological: Negative.  Negative for adenopathy. Does not bruise/bleed easily.  Psychiatric/Behavioral: Negative.  Negative for agitation and sleep disturbance. The patient is not nervous/anxious.        Objective:    Physical Exam Constitutional:      Appearance: Normal appearance.  HENT:     Head: Normocephalic and atraumatic.     Mouth/Throat:     Mouth: Mucous membranes are moist.     Dentition: Normal dentition (dentition fair).     Pharynx: Oropharynx is clear. Uvula midline.  Cardiovascular:     Rate and Rhythm: Normal rate and regular rhythm.     Heart sounds: Normal heart sounds. No murmur heard.    No friction rub. No gallop.  Pulmonary:     Effort: Pulmonary effort is normal.     Breath sounds: Normal breath sounds. No wheezing, rhonchi or rales.  Abdominal:     General: There is no distension.     Tenderness: There is no abdominal tenderness.  Musculoskeletal:     Right lower leg: No edema.     Left lower leg: No edema.  Skin:    General: Skin is warm and dry.     Findings: No rash.     Comments: Acne has resolved on the face hands are very dry with mild eczematous rash  Neurological:     General: No focal deficit present.     Mental Status: She is alert.  Psychiatric:        Mood and Affect: Mood normal.        Behavior: Behavior normal.     BP (!) 191/95   Pulse 75   Temp 98.1 F (36.7 C)   Ht 4\' 9"  (1.448 m)   Wt 136 lb (61.7 kg)   LMP 09/12/2017 (LMP Unknown)   SpO2 100%   BMI 29.43 kg/m  Wt Readings from Last 3 Encounters:  08/27/22 136 lb (61.7 kg)  06/23/22 135 lb 9.6 oz (61.5 kg)  03/23/22 133 lb 3.2 oz (60.4 kg)  MRI 02/2021 FINDINGS: COMBINED FINDINGS FOR BOTH MR ABDOMEN AND PELVIS   Study is significantly limited due to motion, uncooperative patient and lack of contrast.  Lower chest: Not visualized.   Hepatobiliary: Liver appears normal in size and contour with no suspicious mass identified. Gallbladder within normal limits. No biliary ductal dilatation identified.   Pancreas: Grossly normal with no mass or ductal dilatation visualized.   Spleen:  Within normal limits in size and appearance.   Adrenals/Urinary Tract:  Adrenal glands appear within normal limits. Mild right hydronephrosis similar to previous CT. No definite suspicious renal mass identified.   Stomach/Bowel: No evidence of bowel obstruction.   Vascular/Lymphatic: No pathologically enlarged lymph nodes identified. No abdominal aortic aneurysm demonstrated.   Reproductive: Uterus measures approximately 11.3 x 16.3 x 24 cm in AP, transverse and craniocaudal dimensions. Several hypointense T2 signal likely uterine fibroids identified measuring up to 4.6 x 2.4 cm on the left. The endometrium is markedly distended with homogeneous hyperintense T1 and hyperintense T2 signal material, likely blood products, measuring approximally 10.6 x 13.7 x 22 cm in AP, transverse and craniocaudal dimensions. Indeterminate heterogeneity of the cervix measuring approximately 3 x 2.4 cm best seen on the sagittal T2 sequence. Vaginal canal appears within normal limits.   Ovaries are not well visualized. Lobulated multicystic appearance of the bilateral adnexa/ovaries measuring up to 5.3 x 3.8 cm in axial dimensions on the left and 4.7 x 3.7 cm on the right. Associated tubular/cystic components demonstrate hyperintense T1 and T2 signal.   Other:  No ascites visualized.   Musculoskeletal: No suspicious bony lesions identified.   IMPRESSION: 1. Hematometra with marked distension of the endometrial cavity and enlargement of the uterus, suggesting obstruction at the level of the cervix. There is an area of heterogeneity in the cervix which is indeterminate and not well evaluated due to motion/lack of contrast, recommend direct visualization/gynecologic evaluation. 2. Ovaries are not well evaluated. There is evidence of bilateral hematosalpinx and likely simple and hemorrhagic ovarian cysts, left greater than right. 3. No ascites or lymphadenopathy identified. 4. Uterine fibroids. 5. Mild right hydronephrosis similar to previous study, possibly secondary  to uterine mass effect on the distal ureter  Health Maintenance Due  Topic Date Due   COLON CANCER SCREENING ANNUAL FOBT  01/29/2022      There are no preventive care reminders to display for this patient.  Lab Results  Component Value Date   TSH 1.480 11/11/2021   Lab Results  Component Value Date   WBC 5.2 01/13/2022   HGB 13.1 01/13/2022   HCT 39.5 01/13/2022   MCV 80 01/13/2022   PLT 260 01/13/2022   Lab Results  Component Value Date   NA 140 06/23/2022   K 4.5 06/23/2022   CO2 26 06/23/2022   GLUCOSE 76 06/23/2022   BUN 7 06/23/2022   CREATININE 0.56 (L) 06/23/2022   BILITOT 0.5 05/28/2021   ALKPHOS 59 05/28/2021   AST 25 05/28/2021   ALT 25 05/28/2021   PROT 6.7 05/28/2021   ALBUMIN 4.3 05/28/2021   CALCIUM 9.3 06/23/2022   ANIONGAP 13 01/01/2021   EGFR 108 06/23/2022   Lab Results  Component Value Date   CHOL 164 05/28/2021   Lab Results  Component Value Date   HDL 69 05/28/2021   Lab Results  Component Value Date   LDLCALC 80 05/28/2021   Lab Results  Component Value Date   TRIG 83 05/28/2021   Lab Results  Component Value Date   CHOLHDL 2.4 05/28/2021   Lab Results  Component Value Date   HGBA1C 5.5 01/24/2018      Assessment & Plan:   Problem List  Items Addressed This Visit       Cardiovascular and Mediastinum   Essential hypertension - Primary    Uncontrolled due to not taking amlodipine we will continue valsartan HCT 320/25 resume amlodipine at 10 mg daily and begin carvedilol 12.5 mg twice daily and check labs      Relevant Medications   amLODipine (NORVASC) 10 MG tablet   furosemide (LASIX) 20 MG tablet   atorvastatin (LIPITOR) 20 MG tablet   valsartan-hydrochlorothiazide (DIOVAN-HCT) 320-25 MG tablet   carvedilol (COREG) 12.5 MG tablet   Other Relevant Orders   Recheck vitals     Respiratory   Allergic rhinitis    Refill Flonase and Zyrtec      Relevant Medications   levocetirizine (XYZAL) 5 MG tablet      Genitourinary   Hematometra    Resolved due to use of Norethidrone      Relevant Medications   norethindrone (AYGESTIN) 5 MG tablet     Other   Schizoaffective disorder (HCC)    Continue with Haldol      Relevant Medications   haloperidol (HALDOL) 1 MG tablet   Pure hypercholesterolemia    Continue atorvastatin      Relevant Medications   amLODipine (NORVASC) 10 MG tablet   furosemide (LASIX) 20 MG tablet   atorvastatin (LIPITOR) 20 MG tablet   valsartan-hydrochlorothiazide (DIOVAN-HCT) 320-25 MG tablet   carvedilol (COREG) 12.5 MG tablet   Other Visit Diagnoses     Edema, unspecified type       Relevant Medications   furosemide (LASIX) 20 MG tablet   Dry skin       Relevant Medications   levocetirizine (XYZAL) 5 MG tablet      Meds ordered this encounter  Medications   amLODipine (NORVASC) 10 MG tablet    Sig: Take 1 tablet (10 mg total) by mouth daily.    Dispense:  90 tablet    Refill:  1   furosemide (LASIX) 20 MG tablet    Sig: Take 1 daily as needed for swelling    Dispense:  30 tablet    Refill:  3   clobetasol cream (TEMOVATE) 0.05 %    Sig: Apply 1 Application topically 2 (two) times daily.    Dispense:  60 g    Refill:  1   atorvastatin (LIPITOR) 20 MG tablet    Sig: Take 1 tablet (20 mg total) by mouth daily.    Dispense:  90 tablet    Refill:  2   haloperidol (HALDOL) 1 MG tablet    Sig: Take 1 tablet (1 mg total) by mouth at bedtime.    Dispense:  30 tablet    Refill:  2   levocetirizine (XYZAL) 5 MG tablet    Sig: Take 1 tablet (5 mg total) by mouth every evening.    Dispense:  90 tablet    Refill:  1   norethindrone (AYGESTIN) 5 MG tablet    Sig: Take 1 tablet (5 mg total) by mouth daily.    Dispense:  90 tablet    Refill:  2   meloxicam (MOBIC) 15 MG tablet    Sig: Take 1 tablet (15 mg total) by mouth daily.    Dispense:  60 tablet    Refill:  1   valsartan-hydrochlorothiazide (DIOVAN-HCT) 320-25 MG tablet    Sig: Take 1 tablet  by mouth daily.    Dispense:  90 tablet    Refill:  3   Vitamin D, Ergocalciferol, (  DRISDOL) 1.25 MG (50000 UNIT) CAPS capsule    Sig: Take 1 capsule (50,000 Units total) by mouth every 7 (seven) days on Wednesday    Dispense:  16 capsule    Refill:  4   carvedilol (COREG) 12.5 MG tablet    Sig: Take 1 tablet (12.5 mg total) by mouth 2 (two) times daily with a meal.    Dispense:  60 tablet    Refill:  3  Return for recheck vital signs with RN 2 weeks  Follow-up: Return in about 6 weeks (around 10/08/2022) for htn.    Shan Levans, MD

## 2022-09-10 ENCOUNTER — Other Ambulatory Visit: Payer: Self-pay

## 2022-09-18 ENCOUNTER — Emergency Department (HOSPITAL_COMMUNITY): Payer: 59

## 2022-09-18 ENCOUNTER — Emergency Department (HOSPITAL_COMMUNITY)
Admission: EM | Admit: 2022-09-18 | Discharge: 2022-09-18 | Disposition: A | Discharge status: Home / Self Care | Payer: 59 | Attending: Emergency Medicine | Admitting: Emergency Medicine

## 2022-09-18 ENCOUNTER — Other Ambulatory Visit: Payer: Self-pay

## 2022-09-18 ENCOUNTER — Encounter (HOSPITAL_COMMUNITY): Payer: Self-pay | Admitting: *Deleted

## 2022-09-18 DIAGNOSIS — G8929 Other chronic pain: Secondary | ICD-10-CM | POA: Diagnosis not present

## 2022-09-18 DIAGNOSIS — M79671 Pain in right foot: Secondary | ICD-10-CM | POA: Insufficient documentation

## 2022-09-18 DIAGNOSIS — M79672 Pain in left foot: Secondary | ICD-10-CM | POA: Diagnosis not present

## 2022-09-18 DIAGNOSIS — M25511 Pain in right shoulder: Secondary | ICD-10-CM | POA: Diagnosis not present

## 2022-09-18 DIAGNOSIS — M19011 Primary osteoarthritis, right shoulder: Secondary | ICD-10-CM | POA: Diagnosis not present

## 2022-09-18 LAB — I-STAT CHEM 8, ED
BUN: 4 mg/dL — ABNORMAL LOW (ref 6–20)
Calcium, Ion: 1.12 mmol/L — ABNORMAL LOW (ref 1.15–1.40)
Chloride: 103 mmol/L (ref 98–111)
Creatinine, Ser: 0.5 mg/dL (ref 0.44–1.00)
Glucose, Bld: 88 mg/dL (ref 70–99)
HCT: 42 % (ref 36.0–46.0)
Hemoglobin: 14.3 g/dL (ref 12.0–15.0)
Potassium: 3.5 mmol/L (ref 3.5–5.1)
Sodium: 139 mmol/L (ref 135–145)
TCO2: 23 mmol/L (ref 22–32)

## 2022-09-18 MED ORDER — OXYCODONE-ACETAMINOPHEN 5-325 MG PO TABS
1.0000 | ORAL_TABLET | Freq: Once | ORAL | Status: AC
  Administered 2022-09-18: 1 via ORAL
  Filled 2022-09-18: qty 1

## 2022-09-18 MED ORDER — DICLOFENAC SODIUM 25 MG PO TBEC
25.0000 mg | DELAYED_RELEASE_TABLET | Freq: Once | ORAL | Status: AC
  Administered 2022-09-18: 25 mg via ORAL
  Filled 2022-09-18: qty 1

## 2022-09-18 MED ORDER — DICLOFENAC 35 MG PO CAPS: 1.0000 | ORAL_CAPSULE | Freq: Two times a day (BID) | ORAL | 0 refills | Status: DC | PRN

## 2022-09-18 NOTE — ED Triage Notes (Signed)
C/o pain in her right shoulder onset 1 week ago, c/o swelling to both ankles , states she is taking her medication however isn't working.

## 2022-09-18 NOTE — ED Provider Notes (Signed)
[place] EMERGENCY DEPARTMENT AT [facility] Provider Note   CSN: [id number] Arrival date & time: 09/18/22  0404     History {Add pertinent medical, surgical, social history, OB history to HPI:1} Chief Complaint  Patient presents with   Shoulder Pain   Foot Pain    Cindy Garrison is a 52 y.o. female.   Shoulder Pain Foot Pain       Home Medications Prior to Admission medications   Medication Sig Start Date End Date Taking? Authorizing Provider  Diclofenac 35 MG CAPS Take 1 capsule by mouth 2 (two) times daily as needed (shoulder or foot pain). 09/18/22  Yes Coda Mathey, Barbara Cower, MD  acetaminophen (TYLENOL) 500 MG tablet Take 500 mg by mouth every 6 (six) hours as needed for mild pain or fever.    [provider]  amLODipine (NORVASC) 10 MG tablet Take 1 tablet (10 mg total) by mouth daily. 08/27/22   Storm Frisk, MD  atorvastatin (LIPITOR) 20 MG tablet Take 1 tablet (20 mg total) by mouth daily. 08/27/22 08/27/23  Storm Frisk, MD  carvedilol (COREG) 12.5 MG tablet Take 1 tablet (12.5 mg total) by mouth 2 (two) times daily with a meal. 08/27/22   Storm Frisk, MD  clobetasol cream (TEMOVATE) 0.05 % Apply 1 Application topically 2 (two) times daily. 08/27/22   Storm Frisk, MD  furosemide (LASIX) 20 MG tablet Take 1 daily as needed for swelling 08/27/22   Storm Frisk, MD  haloperidol (HALDOL) 1 MG tablet Take 1 tablet (1 mg total) by mouth at bedtime. 08/27/22 08/27/23  Storm Frisk, MD  levocetirizine (XYZAL) 5 MG tablet Take 1 tablet (5 mg total) by mouth every evening. 08/27/22   Storm Frisk, MD  meloxicam (MOBIC) 15 MG tablet Take 1 tablet (15 mg total) by mouth daily. 08/27/22   Storm Frisk, MD  norethindrone (AYGESTIN) 5 MG tablet Take 1 tablet (5 mg total) by mouth daily. 08/27/22   Storm Frisk, MD  valsartan-hydrochlorothiazide (DIOVAN-HCT) 320-25 MG tablet Take 1 tablet by mouth daily. 08/27/22   Storm Frisk, MD   Vitamin D, Ergocalciferol, (DRISDOL) 1.25 MG (50000 UNIT) CAPS capsule Take 1 capsule (50,000 Units total) by mouth every 7 (seven) days on Wednesday 08/27/22 08/27/23  Storm Frisk, MD  hydrochlorothiazide (HYDRODIURIL) 25 MG tablet Take 1 tablet (25 mg total) by mouth daily. 09/20/19 01/23/20  Hoy Register, MD  lisinopril (ZESTRIL) 40 MG tablet TAKE 1 TABLET (40 MG TOTAL) BY MOUTH DAILY. TO LOWER BLOOD PRESSURE 01/08/20 01/23/20  Hoy Register, MD  omeprazole (PRILOSEC) 40 MG capsule Take 1 capsule (40 mg total) by mouth 2 (two) times daily before a meal. 12/28/18 12/29/18  Fulp, Hewitt Shorts, MD      Allergies    Other    Review of Systems   Review of Systems  Physical Exam Updated Vital Signs BP 134/81   Pulse 64   Temp 98.9 F (37.2 C) (Oral)   Resp 18   Ht 4\' 9"  (1.448 m)   Wt 61.7 kg   LMP 09/12/2017 (LMP Unknown)   SpO2 100%   BMI 29.44 kg/m  Physical Exam  ED Results / Procedures / Treatments   Labs (all labs ordered are listed, but only abnormal results are displayed) Labs Reviewed  I-STAT CHEM 8, ED - Abnormal; Notable for the following components:      Result Value   BUN 4 (*)    Calcium, Ion 1.12 (*)  All other components within normal limits    EKG None  Radiology DG Shoulder Right  Result Date: 09/18/2022 CLINICAL DATA:  Right shoulder pain which began 1 week ago. EXAM: RIGHT SHOULDER - 2+ VIEW COMPARISON:  None Available. FINDINGS: There are no signs of acute fracture or dislocation. Advanced degenerative changes at the acromioclavicular joint noted with joint space narrowing and marginal spur formation. IMPRESSION: 1. No acute findings. 2. Advanced acromioclavicular osteoarthritis. Electronically Signed   By: Signa Kell M.D.   On: 09/18/2022 06:37    Procedures Procedures  {Document cardiac monitor, telemetry assessment procedure when appropriate:1}  Medications Ordered in ED Medications  oxyCODONE-acetaminophen (PERCOCET/ROXICET) 5-325 MG per  tablet 1 tablet (1 tablet Oral Given 09/18/22 0611)  diclofenac (VOLTAREN) EC tablet 25 mg (25 mg Oral Given 09/18/22 1610)    ED Course/ Medical Decision Making/ A&P   {   Click here for ABCD2, HEART and other calculatorsREFRESH Note before signing :1}                              Medical Decision Making Amount and/or Complexity of Data Reviewed Radiology: ordered.  Risk Prescription drug management.   ***  {Document critical care time when appropriate:1} {Document review of labs and clinical decision tools ie heart score, Chads2Vasc2 etc:1}  {Document your independent review of radiology images, and any outside records:1} {Document your discussion with family members, caretakers, and with consultants:1} {Document social determinants of health affecting pt's care:1} {Document your decision making why or why not admission, treatments were needed:1} Final Clinical Impression(s) / ED Diagnoses Final diagnoses:  Chronic right shoulder pain  Left foot pain    Rx / DC Orders ED Discharge Orders          Ordered    Diclofenac 35 MG CAPS  2 times daily PRN        09/18/22 0650

## 2022-10-12 ENCOUNTER — Ambulatory Visit: Payer: 59

## 2022-10-15 ENCOUNTER — Ambulatory Visit (INDEPENDENT_AMBULATORY_CARE_PROVIDER_SITE_OTHER): Payer: 59 | Admitting: Physician Assistant

## 2022-10-15 ENCOUNTER — Encounter (HOSPITAL_COMMUNITY): Payer: Self-pay | Admitting: Physician Assistant

## 2022-10-15 DIAGNOSIS — F259 Schizoaffective disorder, unspecified: Secondary | ICD-10-CM | POA: Diagnosis not present

## 2022-10-15 MED ORDER — HALOPERIDOL 1 MG PO TABS: 1.0000 mg | ORAL_TABLET | Freq: Every day | ORAL | 2 refills | Status: DC

## 2022-10-15 NOTE — Progress Notes (Unsigned)
[facility] MD/PA/NP OP Progress Note  10/15/2022 5:27 PM Cindy Garrison  MRN:  [id number]  Chief Complaint:  Chief Complaint  Patient presents with   Follow-up   Medication Refill   HPI:   Cindy Garrison is a 52 year old, African-American female with a past psychiatric history significant for schizoaffective disorder (unspecified type) who presents to [facility] for follow-up and medication management.  Language interpreter services were utilized during the duration of the encounter.  Patient is currently being managed on the following medication: Haldol 1 mg at bedtime.  Patient reports no issues or concerns regarding her medication management.  Patient denies experiencing any adverse side effects at this time.  Patient denies depressive symptoms nor does she endorse anxiety.  Patient denies auditory or visual hallucinations and does not appear to be responding to internal/external stimuli.  Patient denies any new stressors at this time but does state that she has been having issues at work.  Patient is alert and oriented x 4, calm, cooperative, and fully engaged in conversation during the encounter.  Patient endorses good mood.  Patient denies suicidal or homicidal ideations.  She further denies auditory or visual hallucinations and does not appear to be responding to internal/external stimuli.  Patient endorses for sleep and receives on average 3 to 4 hours of sleep per night.  Patient endorses good appetite and eats on average 3 meals per day.  Patient denies alcohol consumption, tobacco use, or illicit drug use.  Visit Diagnosis:    ICD-10-CM   1. Schizoaffective disorder, unspecified type (HCC)  F25.9 haloperidol (HALDOL) 1 MG tablet      Past Psychiatric History:  Schizoaffective disorder.  Patient has been on Haldol for 10 years.  Prior to establishing care with [facility], patient was being seen at [facility]   Past  Medical History:  Past Medical History:  Diagnosis Date   #[id number]    Acne    Back pain    Essential hypertension    Seasonal allergies    Syncope 09/16/2014   Syncope and collapse 09/16/2014    Past Surgical History:  Procedure Laterality Date   DILITATION & CURRETTAGE/HYSTROSCOPY WITH NOVASURE ABLATION N/A 12/22/2017   Procedure: DILATATION & CURETTAGE/ HYSTEROSCOPY WITH ENDOMETRIAL FIBROID AND  NOVASURE ABLATION;  Surgeon: Willodean Rosenthal, MD;  Location: [facility];  Service: Gynecology;  Laterality: N/A;    Family Psychiatric History:  Patient denies  Family History:  Family History  Problem Relation Age of Onset   Breast cancer Sister     Social History:  Social History   Socioeconomic History   Marital status: Married    Spouse name: Not on file   Number of children: Not on file   Years of education: Not on file   Highest education level: Not on file  Occupational History   Not on file  Tobacco Use   Smoking status: Never   Smokeless tobacco: Never  Vaping Use   Vaping status: Never Used  Substance and Sexual Activity   Alcohol use: Not Currently    Comment: occ.   Drug use: No   Sexual activity: Not on file    Comment: monogamous female partner for 5 years (2010)  Other Topics Concern   Not on file  Social History Narrative   Not on file   Social Determinants of Health   Financial Resource Strain: High Risk (05/28/2021)   Overall Financial Resource Strain (CARDIA)    Difficulty of Paying Living  Expenses: Very hard  Food Insecurity: Food Insecurity Present (05/28/2021)   Hunger Vital Sign    Worried About Running Out of Food in the Last Year: Often true    Ran Out of Food in the Last Year: Often true  Transportation Needs: No Transportation Needs (03/20/2021)   PRAPARE - Administrator, Civil Service (Medical): No    Lack of Transportation (Non-Medical): No  Physical Activity: Not on file  Stress: Not on file  Social Connections: Not on  file    Allergies:  Allergies  Allergen Reactions   Other Rash    Blood pressure pill (unknown name)    Metabolic Disorder Labs: Lab Results  Component Value Date   HGBA1C 5.5 01/24/2018   No results found for: "PROLACTIN" Lab Results  Component Value Date   CHOL 164 05/28/2021   TRIG 83 05/28/2021   HDL 69 05/28/2021   CHOLHDL 2.4 05/28/2021   LDLCALC 80 05/28/2021   LDLCALC 90 01/23/2020   Lab Results  Component Value Date   TSH 1.480 11/11/2021   TSH 1.260 06/20/2019    Therapeutic Level Labs: No results found for: "LITHIUM" No results found for: "VALPROATE" No results found for: "CBMZ"  Current Medications: Current Outpatient Medications  Medication Sig Dispense Refill   acetaminophen (TYLENOL) 500 MG tablet Take 500 mg by mouth every 6 (six) hours as needed for mild pain or fever.     amLODipine (NORVASC) 10 MG tablet Take 1 tablet (10 mg total) by mouth daily. 90 tablet 1   atorvastatin (LIPITOR) 20 MG tablet Take 1 tablet (20 mg total) by mouth daily. 90 tablet 2   carvedilol (COREG) 12.5 MG tablet Take 1 tablet (12.5 mg total) by mouth 2 (two) times daily with a meal. 60 tablet 3   clobetasol cream (TEMOVATE) 0.05 % Apply 1 Application topically 2 (two) times daily. 60 g 1   Diclofenac 35 MG CAPS Take 1 capsule by mouth 2 (two) times daily as needed (shoulder or foot pain). 30 capsule 0   furosemide (LASIX) 20 MG tablet Take 1 daily as needed for swelling 30 tablet 3   haloperidol (HALDOL) 1 MG tablet Take 1 tablet (1 mg total) by mouth at bedtime. 30 tablet 2   levocetirizine (XYZAL) 5 MG tablet Take 1 tablet (5 mg total) by mouth every evening. 90 tablet 1   meloxicam (MOBIC) 15 MG tablet Take 1 tablet (15 mg total) by mouth daily. 60 tablet 1   norethindrone (AYGESTIN) 5 MG tablet Take 1 tablet (5 mg total) by mouth daily. 90 tablet 2   valsartan-hydrochlorothiazide (DIOVAN-HCT) 320-25 MG tablet Take 1 tablet by mouth daily. 90 tablet 3   Vitamin D,  Ergocalciferol, (DRISDOL) 1.25 MG (50000 UNIT) CAPS capsule Take 1 capsule (50,000 Units total) by mouth every 7 (seven) days on Wednesday 16 capsule 4   No current facility-administered medications for this visit.     Musculoskeletal: Strength & Muscle Tone: within normal limits Gait & Station: normal Patient leans: N/A  Psychiatric Specialty Exam: Review of Systems  Psychiatric/Behavioral:  Negative for decreased concentration, dysphoric mood, hallucinations, self-injury, sleep disturbance and suicidal ideas. The patient is not nervous/anxious and is not hyperactive.     Blood pressure (!) 148/85, pulse 94, temperature 98.3 F (36.8 C), temperature source Oral, height 4\' 9"  (1.448 m), weight 136 lb (61.7 kg), last menstrual period 09/12/2017, SpO2 99%.Body mass index is 29.43 kg/m.  General Appearance: Casual  Eye Contact:  Fair  Speech:  Clear and Coherent and Normal Rate  Volume:  Normal  Mood:  Euthymic  Affect:  Appropriate  Thought Process:  Coherent, Goal Directed, and Descriptions of Associations: Intact  Orientation:  Full (Time, Place, and Person)  Thought Content: WDL   Suicidal Thoughts:  No  Homicidal Thoughts:  No  Memory:  Immediate;   Good Recent;   Good Remote;   Good  Judgement:  Good  Insight:  Good  Psychomotor Activity:  Normal  Concentration:  Concentration: Good and Attention Span: Good  Recall:  Good  Fund of Knowledge: Good  Language: Good  Akathisia:  No  Handed:  Right  AIMS (if indicated): not done  Assets:  Communication Skills Desire for Improvement Financial Resources/Insurance Housing Intimacy Social Support  ADL's:  Intact  Cognition: WNL  Sleep:  Good   Screenings: AIMS    Flowsheet Row Clinical Support from 01/22/2022 in [facility] Office Visit from 04/01/2020 in [facility]  AIMS Total Score 2 0      GAD-7    Flowsheet Row Clinical Support from 10/15/2022 in  [facility] Office Visit from 08/27/2022 in [facility] Clinical Support from 08/13/2022 in [facility] Clinical Support from 06/18/2022 in [facility] Clinical Support from 03/19/2022 in [facility]  Total GAD-7 Score 0 0 0 0 0      PHQ2-9    Flowsheet Row Clinical Support from 10/15/2022 in [facility] Office Visit from 08/27/2022 in [facility] Clinical Support from 08/13/2022 in [facility] Clinical Support from 06/18/2022 in [facility] Clinical Support from 03/19/2022 in [facility]  PHQ-2 Total Score 0 0 0 0 0      Flowsheet Row Clinical Support from 10/15/2022 in [facility] ED from 09/18/2022 in [facility] Emergency Department at [facility] Clinical Support from 08/13/2022 in [facility]  C-SSRS RISK CATEGORY No Risk No Risk No Risk        Assessment and Plan:   Cindy Garrison is a 52 year old, African-American female with a past psychiatric history significant for schizoaffective disorder (unspecified type) who presents to [facility] for follow-up and medication management.  Language interpreter services were utilized during the duration of the encounter.  Patient presents to the encounter reporting no issues or concerns regarding her use of Haldol.  Patient denies experiencing any adverse side effects at this time.  Patient denies depressive symptoms nor does she endorse anxiety.  She further denies auditory or visual hallucinations.  The only stressor that concerns the patient at this time is her issues with work.  Patient endorses stability on her current medication regimen and will  continue to take her medication as prescribed.  Patient's medication to be e-prescribed to pharmacy of choice.  Collaboration of Care: Collaboration of Care: Medication Management AEB provider managing patient's psychiatric medications, Primary Care Provider AEB patient being seen by a primary care provider, and Psychiatrist AEB patient being followed by mental health provider at this facility  Patient/Guardian was advised Release of Information must be obtained prior to any record release in order to collaborate their care with an outside provider. Patient/Guardian was advised if they have not already done so to contact the registration department to  sign all necessary forms in order for [place] to release information regarding their care.   Consent: Patient/Guardian gives verbal consent for treatment and assignment of benefits for services provided during this visit. Patient/Guardian expressed understanding and agreed to proceed.   1. Schizoaffective disorder, unspecified type (HCC)  - haloperidol (HALDOL) 1 MG tablet; Take 1 tablet (1 mg total) by mouth at bedtime.  Dispense: 30 tablet; Refill: 2  Patient to follow-up in 2 months Provider spent a total of 23 minutes with the patient/reviewing patient's chart  Meta Hatchet, PA 10/15/2022, 5:27 PM

## 2022-10-23 IMAGING — MR MR ABDOMEN W/O CM
24 of 35 series · 37 of 48 positions shown · non-contrast
Comparison: CT abdomen and pelvis 11/06/2020

CLINICAL DATA: Abdominal/pelvic mass

EXAM:
MRI ABDOMEN AND PELVIS WITHOUT CONTRAST
TECHNIQUE: Multiplanar multisequence MR imaging of the abdomen and pelvis was
performed. No intravenous contrast was administered.

[Series 3: T2 · coronal · 5.0mm · 1.25mm/px · 1 of 45 slices shown (1 of 3)]
[im 1/45]
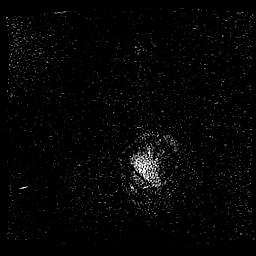

[Series 4: T2 fat-sat · axial · 4.5mm · 0.71mm/px · 1 of 60 slices shown (1 of 2)]
[im 1/60]
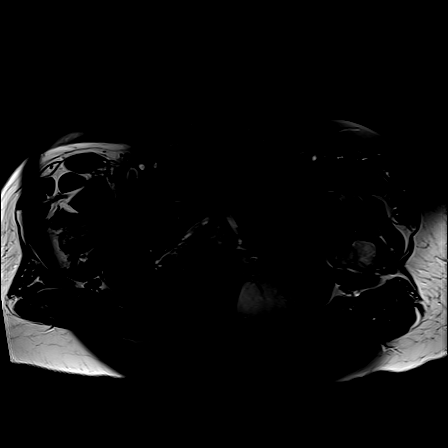

[Series 5: T2 · axial · 4.5mm · 0.71mm/px · 1 of 60 slices shown (2 of 3)]
[im 1/60]
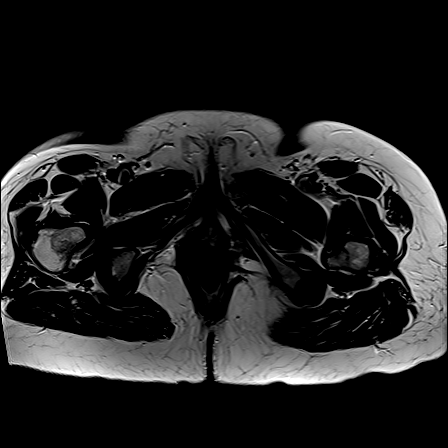

[Series 6: cor haste · coronal · 6.0mm · 1.25mm/px · 1 of 40 slices shown]
[im 1/40]
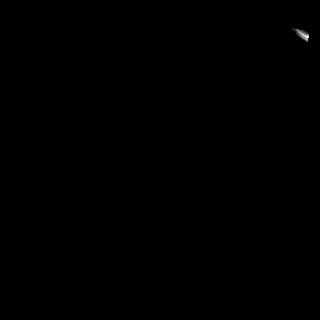

[Series 6: ax haste · axial · 4.5mm · 1.00mm/px · 1 of 67 slices shown]
[im 1/67]
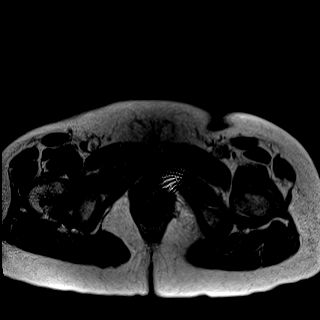

[Series 9: T2 fat-sat · axial · 6.0mm · 1.19mm/px · 1 of 10 slices shown (2 of 2)]
[im 1/10]
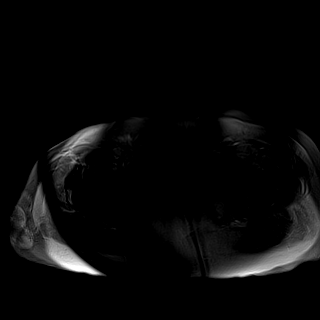

[Series 10: (id) loc fb · axial · 10.0mm · 0.74mm/px · 1 of 21 slices shown (1 of 2)]
[im 1/21]
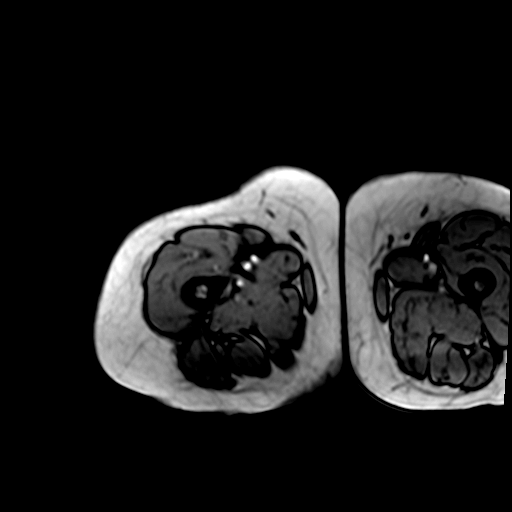

[Series 13: ax in and · axial · 3.0mm · 1.19mm/px · z∈[-57,+276]mm · 2 of 112 slices shown (1 of 6)]
[im 1/112]
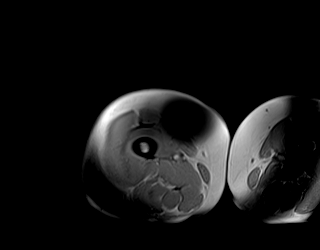
[im 112/112]
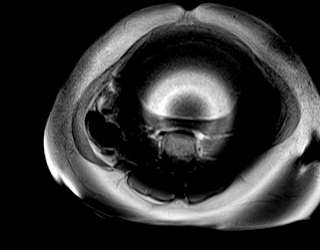

[Series 13: ax in and · axial · 3.0mm · 1.19mm/px · z∈[-57,+276]mm · 2 of 112 slices shown (2 of 6)]
[im 1/112]
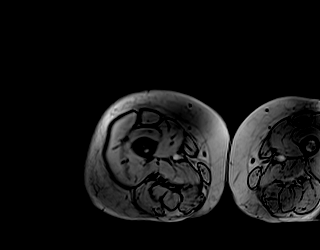
[im 112/112]
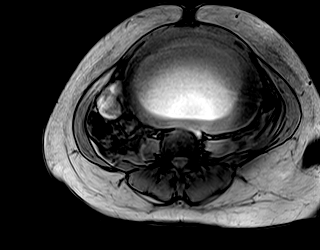

[Series 14: DWI · axial · 6.0mm · 1.42mm/px · 1 of 100 slices shown (1 of 3)]
[im 1/100]
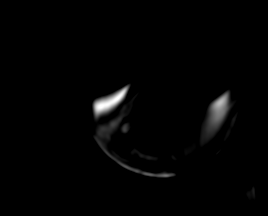

[Series 15: DWI · axial · 6.0mm · 1.42mm/px · 1 of 50 slices shown (2 of 3)]
[im 1/50]
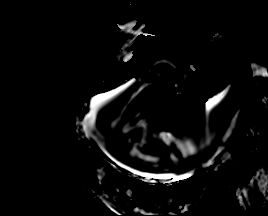

[Series 16: DWI · axial · 6.0mm · 1.42mm/px · 1 of 50 slices shown (3 of 3)]
[im 1/50]
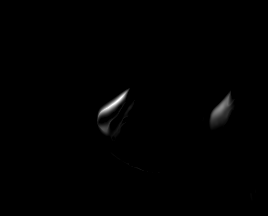

[Series 17: t1_vibe_opp-in_tra_p4_bh · axial · 3.0mm · 1.19mm/px · z∈[-67,+266]mm · 2 of 112 slices shown (1 of 2)]
[im 1/112]
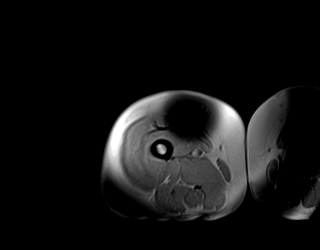
[im 112/112]
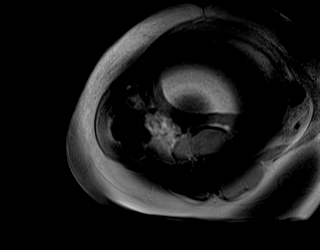

[Series 17: t1_vibe_opp-in_tra_p4_bh · axial · 3.0mm · 1.19mm/px · z∈[-67,+266]mm · 3 of 112 slices shown (2 of 2)]
[im 1/112]
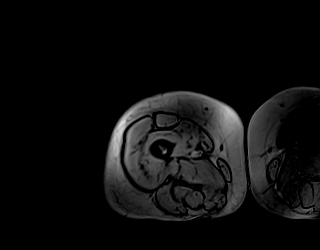
[im 56/112]
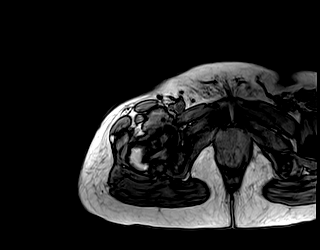
[im 112/112]
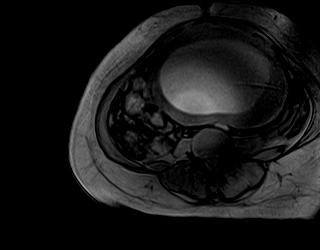

[Series 18: cor haste plvs · coronal · 6.0mm · 1.25mm/px · 1 of 46 slices shown]
[im 1/46]
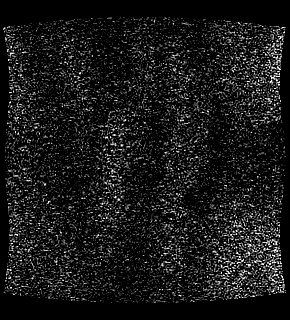

[Series 19: T2 · axial · 5.0mm · 0.54mm/px · 1 of 30 slices shown (3 of 3)]
[im 1/30]
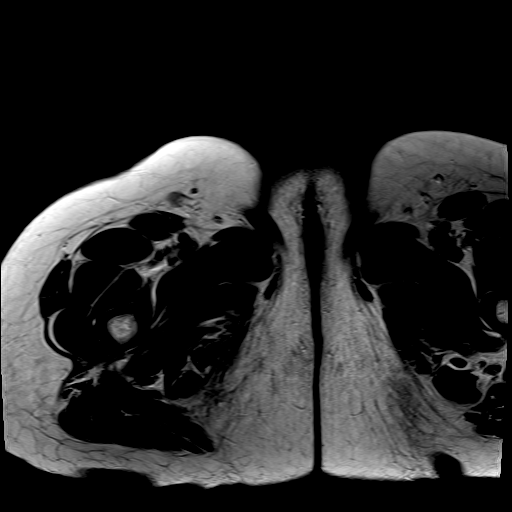

[Series 20: sag tse plvs · sagittal · 5.0mm · 0.51mm/px · 1 of 36 slices shown]
[im 1/36]
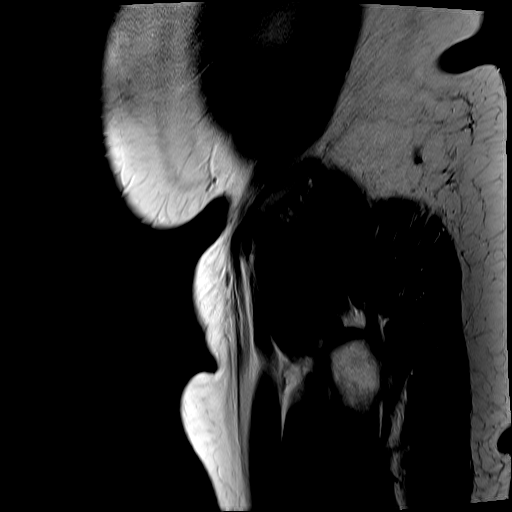

[Series 21: ax in and · axial · 3.0mm · 1.19mm/px · z∈[+71,+404]mm · 3 of 112 slices shown (3 of 6)]
[im 1/112]
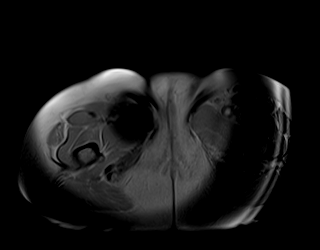
[im 56/112]
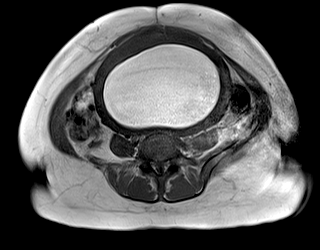
[im 112/112]
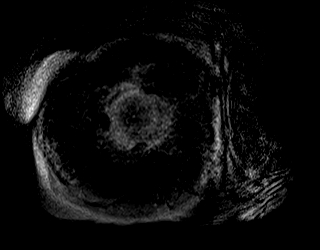

[Series 21: ax in and · axial · 3.0mm · 1.19mm/px · z∈[+71,+404]mm · 3 of 112 slices shown (4 of 6)]
[im 1/112]
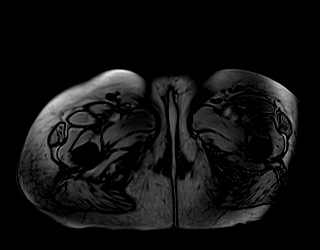
[im 56/112]
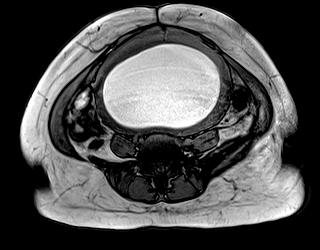
[im 112/112]
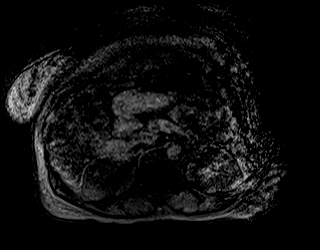

[Series 24: (id) loc fb · axial · 10.0mm · 0.74mm/px · 1 of 21 slices shown (2 of 2)]
[im 1/21]
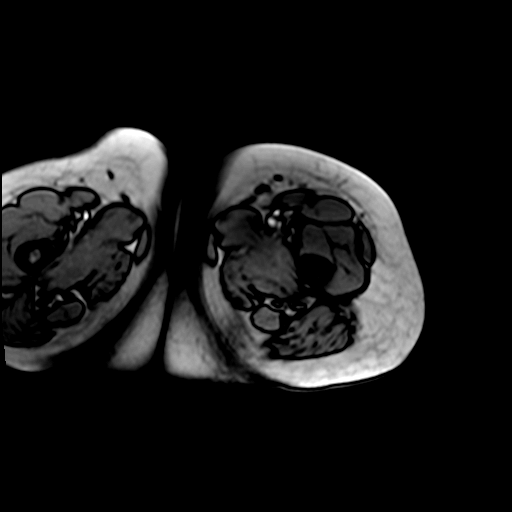

[Series 25: T1 · axial · 5.0mm · 1.02mm/px · 1 of 40 slices shown]
[im 1/40]
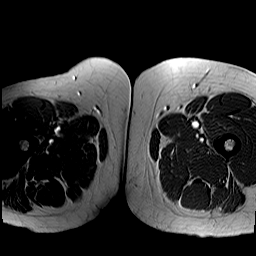

[Series 26: T1 fat-sat · axial · 5.0mm · 1.02mm/px · 1 of 40 slices shown]
[im 1/40]
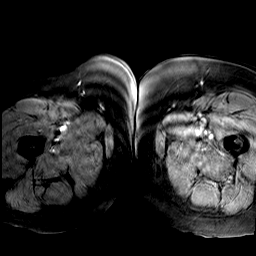

[Series 27: ax in and · axial · 3.0mm · 1.19mm/px · z∈[+106,+487]mm · 3 of 128 slices shown (5 of 6)]
[im 1/128]
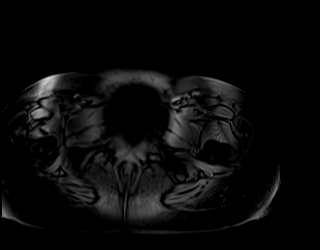
[im 64/128]
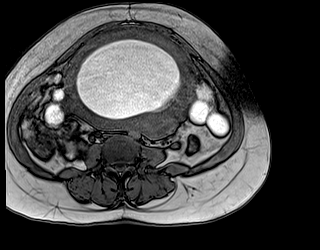
[im 128/128]
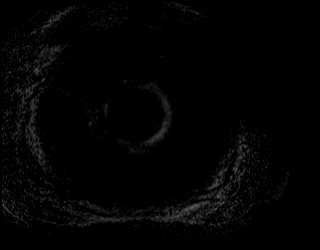

[Series 27: ax in and · axial · 3.0mm · 1.19mm/px · z∈[+106,+487]mm · 3 of 128 slices shown (6 of 6)]
[im 1/128]
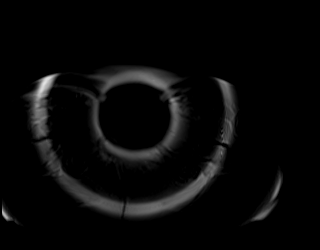
[im 64/128]
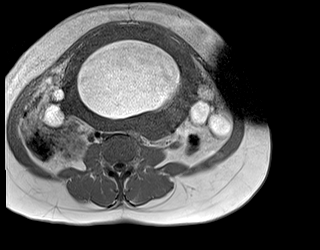
[im 128/128]
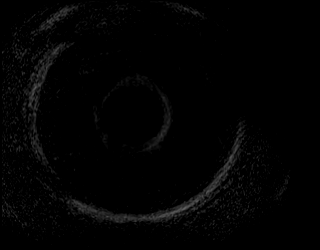

[37 of 48 positions shown; findings below may reference images not displayed]

FINDINGS: COMBINED FINDINGS FOR BOTH MR ABDOMEN AND PELVIS

Study is significantly limited due to motion, uncooperative patient
and lack of contrast.

Lower chest: Not visualized.

Hepatobiliary: Liver appears normal in size and contour with no
suspicious mass identified. Gallbladder within normal limits. No
biliary ductal dilatation identified.

Pancreas: Grossly normal with no mass or ductal dilatation
visualized.

Spleen:  Within normal limits in size and appearance.

Adrenals/Urinary Tract: Adrenal glands appear within normal limits.
Mild right hydronephrosis similar to previous CT. No definite
suspicious renal mass identified.

Stomach/Bowel: No evidence of bowel obstruction.

Vascular/Lymphatic: No pathologically enlarged lymph nodes
identified. No abdominal aortic aneurysm demonstrated.

Reproductive: Uterus measures approximately 11.3 x 16.3 x 24 cm in
AP, transverse and craniocaudal dimensions. Several hypointense T2
signal likely uterine fibroids identified measuring up to 4.6 x
cm on the left. The endometrium is markedly distended with
homogeneous hyperintense T1 and hyperintense T2 signal material,
likely blood products, measuring approximally 10.6 x 13.7 x 22 cm in
AP, transverse and craniocaudal dimensions. Indeterminate
heterogeneity of the cervix measuring approximately 3 x 2.4 cm best
seen on the sagittal T2 sequence. Vaginal canal appears within
normal limits.

Ovaries are not well visualized. Lobulated multicystic appearance of
the bilateral adnexa/ovaries measuring up to 5.3 x 3.8 cm in axial
dimensions on the left and 4.7 x 3.7 cm on the right. Associated
tubular/cystic components demonstrate hyperintense T1 and T2 signal.

Other:  No ascites visualized.

Musculoskeletal: No suspicious bony lesions identified.
IMPRESSION: 1. Hematometra with marked distension of the endometrial cavity and
enlargement of the uterus, suggesting obstruction at the level of
the cervix. There is an area of heterogeneity in the cervix which is
indeterminate and not well evaluated due to motion/lack of contrast,
recommend direct visualization/gynecologic evaluation.
2. Ovaries are not well evaluated. There is evidence of bilateral
hematosalpinx and likely simple and hemorrhagic ovarian cysts, left
greater than right.
3. No ascites or lymphadenopathy identified.
4. Uterine fibroids.
5. Mild right hydronephrosis similar to previous study, possibly
secondary to uterine mass effect on the distal ureter.

## 2022-11-28 NOTE — Progress Notes (Unsigned)
Established Patient Office Visit  Subjective:  Patient ID: Cindy Garrison, female    DOB: September 13, 1970  Age: 52 y.o. MRN: [id number]  CC:  No chief complaint on file.   HPI 01/2021 Cindy Garrison presents for follow up after ED visit and for hypertension follow up. [place] interpretation services were used, with [place], [phone].   The patient visited the ED for the ongoing issue of lower abdominal pain. This is attributed to the uterine leiomyoma She states that the pain has actually improved recently. The pain is bilateral and she notices it on her left and right sides. She admits some vaginal pain that she has noticed when wiping after urinating. She denies any vaginal discharge or bleeding. An MRI was ordered by gynecology (Dr. Donavan Foil) to assess the uterine leiomyoma, with plans for surgery. The patient is unsure of what the MRI is and has not been able to have the imaging done yet. She would appreciate assistance with scheduling this scan. It is vital that this scan be completed.  Patient has diagnosis of schizoaffective disorder, for which she takes haloperidol (Haldol). She has established care with behavioral health, Toy Cookey, NP.  Since her last visit here, she has been able to get a screening mammogram which was negative. She does need a colon cancer screening.   The patient's blood pressure is at therapeutic goal today. She has been confused regarding the dosage of the Valsartan-HCTZ, but should now be taking the higher dose as prescribed last time. She has reported some dizziness since last visit, but denies any palpitations. She has no marked edema.   05/28/2021 Patient seen in return follow-up and this visit was assisted by video interpreter Latifah for [place] [phone] The patient has been seen by gynecology's on several occasions since I last saw her in January.  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  This is the cause of her pelvic pain.  She was begun on  Orilissa to see if this would cause decreased uterine bleeding and facilitate absorption of the hematometra The patient took about a pack and a half of the medication given to her and has not been able to complete she had excess sweating and increased acne on the face and the gluteal area.  She also had other side effects she did not like.  Patient has return visit June 1 with Dr. Shawnie Pons to discuss further options. Patient also has fallen twice at work and on arrival blood pressure is 115/79.  We had recently increased her blood pressure medicine to add valsartan HCT along with amlodipine and I think this may be too much medication for this patient but tickly with the ongoing issues in the uterus.  Patient also has increased allergies and picked up a box of Allegra-D I indicated to her that she should not be taking any allergy medicines with the components.  We can get her prescription version of Zyrtec.  There are no other complaints.  MRI done in February has been reviewed and documented as below  Patient complains of allergic rhinitis worse with the increased pollen were currently experiencing she picked up an Allegra-D from the pharmacy and I told her to stop this because of the D component  7/18 Patient seen in return follow-up this visit was assisted by [place] interpreter Cira Servant [phone] Patient is in good spirits on arrival states her bleeding has significantly improved as has her pelvic pain on the current dose of norethindrone  On arrival blood pressure  is good 134/70.  Patient is having continued allergies she did not get her cetirizine filled.  She also complains of bilateral upper and lower tooth pain.  There are no other complaints.  Patient states her dizziness and frequent falls have stopped  12/27 Patient is seen in return follow-up and is seen with the ewe in person interpreter Cedric.  The patient has history of hypertension, chronic severe iron deficiency from bleeding in the  uterus, allergic rhinitis, low back pain, visual changes.  Blood pressure was elevated 171/63.  Is very difficult to carry on this encounter because of language barrier.  She brings a bag of medications most of which she is currently not taking.  She is supposed to be on norethindrone for bleeding in the uterus she has not been taking this.  She also supposed be on amlodipine for blood pressure she is not taking this as well.  She complains of dryness in the hands and pain in the feet. Below is documentation from her visit in October saw [facility] 11/11/21  ED 12/15 for foot pain  Saw GYN 10/20 and continues Aygestin for hematometra no surgery needed  1. Allergic rhinitis due to pollen, unspecified seasonality D/c zyrtec - levocetirizine (XYZAL) 5 MG tablet; Take 1 tablet (5 mg total) by mouth every evening.  Dispense: 90 tablet; Refill: 1   2. Dry skin On ears and feet-currently being treated for tinea pedis and otitis externa but I am wondering if she may have seborrhea or psoriasis - Ambulatory referral to derm - levocetirizine (XYZAL) 5 MG tablet; Take 1 tablet (5 mg total) by mouth every evening.  Dispense: 90 tablet; Refill: 1   3. Vision changes Wants to have prescription safety glasses made - Ambulatory referral to Ophthalmology   4. Edema, unspecified type Patient information given  - Basic metabolic panel - TSH - furosemide (LASIX) 20 MG tablet; Take 1 daily for 3 days as needed for swelling (use sparingly)  Dispense: 30 tablet; Refill: 3   5. Essential hypertension Continue valsartan/HCT - Basic metabolic panel   6. Hospital discharge follow-up     Patient did not want to use french interpreter.  We were unable to get a [place] interpreter on the spot.  I understand minimal french and she had things written down that I was able to read in [place] that is very similar to written french.  All issues addressed in English/written french/[place].    Note the patient prefers ewe language and  does not understand [place] well enough.  She is contemplating trying to declare disability but wants to continue to work we explained her this is not possible.  03/23/22 This patient seen in return follow-up as an assisted by in person interpreter Cedric for e[place] Recent mammogram was unremarkable.  Patient was in the ER in February for edema of the lower extremity she had her amlodipine held. On arrival blood pressure is elevated 169/77.  The patient's been holding all blood pressure medicines.  There are no other complaints.  She is followed by gynecology maintains oral birth control.  06/22/22 Patient seen return follow-up for hypertension.  Patient has schizoaffective disorder followed by psychiatry and is stable on her chronic medication Haldol.  Patient also is improved with regards to her pelvic pain on the norethindrone per gynecology.  On arrival blood pressure is quite elevated but recheck 170/85 it persists.  Patient maintains valsartan HCT at maximum dose  8/9 This patient seen in return for follow-up assisted by in  person [place] interpreter Cedric Patient has no complaints at this visit needs follow-up on multiple problems.  She has not been taking amlodipine only the valsartan HCT and on arrival blood pressure 191/95.  She is taking her gynecologic medication and her bleeding and uterus have resolved She needs multiple medication refills  12/02/22  Past Medical History:  Diagnosis Date   #[id number]    Acne    Back pain    Essential hypertension    Seasonal allergies    Syncope 09/16/2014   Syncope and collapse 09/16/2014    Past Surgical History:  Procedure Laterality Date   DILITATION & CURRETTAGE/HYSTROSCOPY WITH NOVASURE ABLATION N/A 12/22/2017   Procedure: DILATATION & CURETTAGE/ HYSTEROSCOPY WITH ENDOMETRIAL FIBROID AND  NOVASURE ABLATION;  Surgeon: Willodean Rosenthal, MD;  Location: [facility];  Service: Gynecology;  Laterality: N/A;    Family History  Problem Relation Age  of Onset   Breast cancer Sister     Social History   Socioeconomic History   Marital status: Married    Spouse name: Not on file   Number of children: Not on file   Years of education: Not on file   Highest education level: Not on file  Occupational History   Not on file  Tobacco Use   Smoking status: Never   Smokeless tobacco: Never  Vaping Use   Vaping status: Never Used  Substance and Sexual Activity   Alcohol use: Not Currently    Comment: occ.   Drug use: No   Sexual activity: Not on file    Comment: monogamous female partner for 5 years (2010)  Other Topics Concern   Not on file  Social History Narrative   Not on file   Social Determinants of Health   Financial Resource Strain: High Risk (05/28/2021)   Overall Financial Resource Strain (CARDIA)    Difficulty of Paying Living Expenses: Very hard  Food Insecurity: Food Insecurity Present (05/28/2021)   Hunger Vital Sign    Worried About Running Out of Food in the Last Year: Often true    Ran Out of Food in the Last Year: Often true  Transportation Needs: No Transportation Needs (03/20/2021)   PRAPARE - Administrator, Civil Service (Medical): No    Lack of Transportation (Non-Medical): No  Physical Activity: Not on file  Stress: Not on file  Social Connections: Not on file  Intimate Partner Violence: Not on file    Outpatient Medications Prior to Visit  Medication Sig Dispense Refill   acetaminophen (TYLENOL) 500 MG tablet Take 500 mg by mouth every 6 (six) hours as needed for mild pain or fever.     amLODipine (NORVASC) 10 MG tablet Take 1 tablet (10 mg total) by mouth daily. 90 tablet 1   atorvastatin (LIPITOR) 20 MG tablet Take 1 tablet (20 mg total) by mouth daily. 90 tablet 2   carvedilol (COREG) 12.5 MG tablet Take 1 tablet (12.5 mg total) by mouth 2 (two) times daily with a meal. 60 tablet 3   clobetasol cream (TEMOVATE) 0.05 % Apply 1 Application topically 2 (two) times daily. 60 g 1    Diclofenac 35 MG CAPS Take 1 capsule by mouth 2 (two) times daily as needed (shoulder or foot pain). 30 capsule 0   furosemide (LASIX) 20 MG tablet Take 1 daily as needed for swelling 30 tablet 3   haloperidol (HALDOL) 1 MG tablet Take 1 tablet (1 mg total) by mouth at bedtime. 30 tablet 2  levocetirizine (XYZAL) 5 MG tablet Take 1 tablet (5 mg total) by mouth every evening. 90 tablet 1   meloxicam (MOBIC) 15 MG tablet Take 1 tablet (15 mg total) by mouth daily. 60 tablet 1   norethindrone (AYGESTIN) 5 MG tablet Take 1 tablet (5 mg total) by mouth daily. 90 tablet 2   valsartan-hydrochlorothiazide (DIOVAN-HCT) 320-25 MG tablet Take 1 tablet by mouth daily. 90 tablet 3   Vitamin D, Ergocalciferol, (DRISDOL) 1.25 MG (50000 UNIT) CAPS capsule Take 1 capsule (50,000 Units total) by mouth every 7 (seven) days on Wednesday 16 capsule 4   No facility-administered medications prior to visit.    Allergies  Allergen Reactions   Other Rash    Blood pressure pill (unknown name)    ROS Review of Systems  Constitutional: Negative.  Negative for fatigue.  HENT:  Negative for ear pain, postnasal drip, rhinorrhea, sinus pressure, sore throat, trouble swallowing and voice change.   Eyes: Negative.  Negative for visual disturbance.  Respiratory: Negative.  Negative for apnea, cough, choking, chest tightness, shortness of breath, wheezing and stridor.   Cardiovascular:  Negative for chest pain, palpitations and leg swelling.  Gastrointestinal: Negative.  Negative for abdominal distention, abdominal pain, nausea and vomiting.  Genitourinary: Negative.  Negative for dysuria, pelvic pain, vaginal bleeding and vaginal discharge.  Musculoskeletal:  Positive for joint swelling. Negative for arthralgias, back pain and myalgias.  Skin:  Negative for rash.       Acne has resolved  Allergic/Immunologic: Negative.  Negative for environmental allergies and food allergies.  Neurological: Negative.  Negative for  dizziness, syncope, weakness and headaches.  Hematological: Negative.  Negative for adenopathy. Does not bruise/bleed easily.  Psychiatric/Behavioral: Negative.  Negative for agitation and sleep disturbance. The patient is not nervous/anxious.       Objective:    Physical Exam Constitutional:      Appearance: Normal appearance.  HENT:     Head: Normocephalic and atraumatic.     Mouth/Throat:     Mouth: Mucous membranes are moist.     Dentition: Normal dentition (dentition fair).     Pharynx: Oropharynx is clear. Uvula midline.  Cardiovascular:     Rate and Rhythm: Normal rate and regular rhythm.     Heart sounds: Normal heart sounds. No murmur heard.    No friction rub. No gallop.  Pulmonary:     Effort: Pulmonary effort is normal.     Breath sounds: Normal breath sounds. No wheezing, rhonchi or rales.  Abdominal:     General: There is no distension.     Tenderness: There is no abdominal tenderness.  Musculoskeletal:     Right lower leg: No edema.     Left lower leg: No edema.  Skin:    General: Skin is warm and dry.     Findings: No rash.     Comments: Acne has resolved on the face hands are very dry with mild eczematous rash  Neurological:     General: No focal deficit present.     Mental Status: She is alert.  Psychiatric:        Mood and Affect: Mood normal.        Behavior: Behavior normal.     LMP 09/12/2017 (LMP Unknown)  Wt Readings from Last 3 Encounters:  09/18/22 136 lb 0.4 oz (61.7 kg)  08/27/22 136 lb (61.7 kg)  06/23/22 135 lb 9.6 oz (61.5 kg)  MRI 02/2021 FINDINGS: COMBINED FINDINGS FOR BOTH MR ABDOMEN AND PELVIS  Study is significantly limited due to motion, uncooperative patient and lack of contrast.   Lower chest: Not visualized.   Hepatobiliary: Liver appears normal in size and contour with no suspicious mass identified. Gallbladder within normal limits. No biliary ductal dilatation identified.   Pancreas: Grossly normal with no mass or  ductal dilatation visualized.   Spleen:  Within normal limits in size and appearance.   Adrenals/Urinary Tract: Adrenal glands appear within normal limits. Mild right hydronephrosis similar to previous CT. No definite suspicious renal mass identified.   Stomach/Bowel: No evidence of bowel obstruction.   Vascular/Lymphatic: No pathologically enlarged lymph nodes identified. No abdominal aortic aneurysm demonstrated.   Reproductive: Uterus measures approximately 11.3 x 16.3 x 24 cm in AP, transverse and craniocaudal dimensions. Several hypointense T2 signal likely uterine fibroids identified measuring up to 4.6 x 2.4 cm on the left. The endometrium is markedly distended with homogeneous hyperintense T1 and hyperintense T2 signal material, likely blood products, measuring approximally 10.6 x 13.7 x 22 cm in AP, transverse and craniocaudal dimensions. Indeterminate heterogeneity of the cervix measuring approximately 3 x 2.4 cm best seen on the sagittal T2 sequence. Vaginal canal appears within normal limits.   Ovaries are not well visualized. Lobulated multicystic appearance of the bilateral adnexa/ovaries measuring up to 5.3 x 3.8 cm in axial dimensions on the left and 4.7 x 3.7 cm on the right. Associated tubular/cystic components demonstrate hyperintense T1 and T2 signal.   Other:  No ascites visualized.   Musculoskeletal: No suspicious bony lesions identified.   IMPRESSION: 1. Hematometra with marked distension of the endometrial cavity and enlargement of the uterus, suggesting obstruction at the level of the cervix. There is an area of heterogeneity in the cervix which is indeterminate and not well evaluated due to motion/lack of contrast, recommend direct visualization/gynecologic evaluation. 2. Ovaries are not well evaluated. There is evidence of bilateral hematosalpinx and likely simple and hemorrhagic ovarian cysts, left greater than right. 3. No ascites or  lymphadenopathy identified. 4. Uterine fibroids. 5. Mild right hydronephrosis similar to previous study, possibly secondary to uterine mass effect on the distal ureter  Health Maintenance Due  Topic Date Due   COLON CANCER SCREENING ANNUAL FOBT  01/29/2022   INFLUENZA VACCINE  08/19/2022   MAMMOGRAM  11/28/2022      There are no preventive care reminders to display for this patient.  Lab Results  Component Value Date   TSH 1.480 11/11/2021   Lab Results  Component Value Date   WBC 5.2 01/13/2022   HGB 14.3 09/18/2022   HCT 42.0 09/18/2022   MCV 80 01/13/2022   PLT 260 01/13/2022   Lab Results  Component Value Date   NA 139 09/18/2022   K 3.5 09/18/2022   CO2 26 06/23/2022   GLUCOSE 88 09/18/2022   BUN 4 (L) 09/18/2022   CREATININE 0.50 09/18/2022   BILITOT 0.5 05/28/2021   ALKPHOS 59 05/28/2021   AST 25 05/28/2021   ALT 25 05/28/2021   PROT 6.7 05/28/2021   ALBUMIN 4.3 05/28/2021   CALCIUM 9.3 06/23/2022   ANIONGAP 13 01/01/2021   EGFR 108 06/23/2022   Lab Results  Component Value Date   CHOL 164 05/28/2021   Lab Results  Component Value Date   HDL 69 05/28/2021   Lab Results  Component Value Date   LDLCALC 80 05/28/2021   Lab Results  Component Value Date   TRIG 83 05/28/2021   Lab Results  Component Value Date   CHOLHDL 2.4  05/28/2021   Lab Results  Component Value Date   HGBA1C 5.5 01/24/2018      Assessment & Plan:   Problem List Items Addressed This Visit   None   No orders of the defined types were placed in this encounter. Return for recheck vital signs with RN 2 weeks  Follow-up: No follow-ups on file.    Shan Levans, MD

## 2022-12-02 ENCOUNTER — Encounter: Payer: Self-pay | Admitting: Critical Care Medicine

## 2022-12-02 ENCOUNTER — Ambulatory Visit: Payer: 59 | Attending: Critical Care Medicine | Admitting: Critical Care Medicine

## 2022-12-02 VITALS — BP 135/65 | HR 76 | Wt 141.2 lb

## 2022-12-02 DIAGNOSIS — R609 Edema, unspecified: Secondary | ICD-10-CM | POA: Diagnosis not present

## 2022-12-02 DIAGNOSIS — F259 Schizoaffective disorder, unspecified: Secondary | ICD-10-CM | POA: Diagnosis not present

## 2022-12-02 DIAGNOSIS — Z1211 Encounter for screening for malignant neoplasm of colon: Secondary | ICD-10-CM

## 2022-12-02 DIAGNOSIS — L84 Corns and callosities: Secondary | ICD-10-CM | POA: Insufficient documentation

## 2022-12-02 DIAGNOSIS — Z1231 Encounter for screening mammogram for malignant neoplasm of breast: Secondary | ICD-10-CM

## 2022-12-02 DIAGNOSIS — E7800 Pure hypercholesterolemia, unspecified: Secondary | ICD-10-CM | POA: Diagnosis not present

## 2022-12-02 DIAGNOSIS — J301 Allergic rhinitis due to pollen: Secondary | ICD-10-CM

## 2022-12-02 DIAGNOSIS — N857 Hematometra: Secondary | ICD-10-CM

## 2022-12-02 DIAGNOSIS — L853 Xerosis cutis: Secondary | ICD-10-CM | POA: Diagnosis not present

## 2022-12-02 DIAGNOSIS — I10 Essential (primary) hypertension: Secondary | ICD-10-CM | POA: Diagnosis not present

## 2022-12-02 MED ORDER — MELOXICAM 15 MG PO TABS: 15.0000 mg | ORAL_TABLET | Freq: Every day | ORAL | 1 refills | Status: DC

## 2022-12-02 MED ORDER — VALSARTAN-HYDROCHLOROTHIAZIDE 320-25 MG PO TABS: 1.0000 | ORAL_TABLET | Freq: Every day | ORAL | 3 refills | Status: DC

## 2022-12-02 MED ORDER — FUROSEMIDE 20 MG PO TABS: ORAL_TABLET | ORAL | 3 refills | Status: DC

## 2022-12-02 MED ORDER — NORETHINDRONE ACETATE 5 MG PO TABS: 5.0000 mg | ORAL_TABLET | Freq: Every day | ORAL | 2 refills | Status: DC

## 2022-12-02 MED ORDER — AMLODIPINE BESYLATE 10 MG PO TABS: 10.0000 mg | ORAL_TABLET | Freq: Every day | ORAL | 1 refills | Status: DC

## 2022-12-02 MED ORDER — HALOPERIDOL 1 MG PO TABS: 1.0000 mg | ORAL_TABLET | Freq: Every day | ORAL | 2 refills | Status: DC

## 2022-12-02 MED ORDER — VITAMIN D (ERGOCALCIFEROL) 1.25 MG (50000 UNIT) PO CAPS: 50000.0000 [IU] | ORAL_CAPSULE | ORAL | 4 refills | Status: DC

## 2022-12-02 MED ORDER — ATORVASTATIN CALCIUM 20 MG PO TABS: 20.0000 mg | ORAL_TABLET | Freq: Every day | ORAL | 2 refills | Status: DC

## 2022-12-02 MED ORDER — CARVEDILOL 12.5 MG PO TABS: 12.5000 mg | ORAL_TABLET | Freq: Two times a day (BID) | ORAL | 3 refills | Status: DC

## 2022-12-02 MED ORDER — LEVOCETIRIZINE DIHYDROCHLORIDE 5 MG PO TABS: 5.0000 mg | ORAL_TABLET | Freq: Every evening | ORAL | 1 refills | Status: DC

## 2022-12-02 NOTE — Assessment & Plan Note (Signed)
Colonoscopy referral was made

## 2022-12-02 NOTE — Assessment & Plan Note (Signed)
Check lipids; continue statin 

## 2022-12-02 NOTE — Assessment & Plan Note (Signed)
Hypertension at goal will check labs refill medications no change in dosings

## 2022-12-02 NOTE — Assessment & Plan Note (Signed)
Resolved on current therapy

## 2022-12-02 NOTE — Assessment & Plan Note (Signed)
Repeat mammogram 

## 2022-12-02 NOTE — Patient Instructions (Addendum)
Labs will be obtained today  All medications were refilled  Colon cancer screening colonoscopy referral made  Mammogram will be ordered   We discussed getting [employer] foot cream for your foot callus and referral to the foot doctor will be made  You can use the diclofenac cream you have for your shoulders as needed  Return 6 months for primary care follow-up  Woax? dodokp?x?wo egbea  Wogade atikeawo kat me ake  D?gbo me d?dz? dodokp? colonoscopy referral w?  Ronni Rumble be woaw? no me d?dz? fe dodokp?   Medzro Goldbond af?tike x?x? na w af? fe ?u?udedi me eye woa?oe ?e af?d?dala gb?  te ?u az diclofenac-tike si le asiw na w ab?ta ne ehi  Tr? ?leti 6 hena gbt? fe kpl?kpl? ?o

## 2022-12-03 LAB — COMPREHENSIVE METABOLIC PANEL
ALT: 28 [IU]/L (ref 0–32)
AST: 30 [IU]/L (ref 0–40)
Albumin: 4.8 g/dL (ref 3.8–4.9)
Alkaline Phosphatase: 48 [IU]/L (ref 44–121)
BUN/Creatinine Ratio: 28 — ABNORMAL HIGH (ref 9–23)
BUN: 17 mg/dL (ref 6–24)
Bilirubin Total: 0.3 mg/dL (ref 0.0–1.2)
CO2: 19 mmol/L — ABNORMAL LOW (ref 20–29)
Calcium: 9 mg/dL (ref 8.7–10.2)
Chloride: 103 mmol/L (ref 96–106)
Creatinine, Ser: 0.6 mg/dL (ref 0.57–1.00)
Globulin, Total: 2.6 g/dL (ref 1.5–4.5)
Glucose: 82 mg/dL (ref 70–99)
Potassium: 3.8 mmol/L (ref 3.5–5.2)
Sodium: 141 mmol/L (ref 134–144)
Total Protein: 7.4 g/dL (ref 6.0–8.5)
eGFR: 107 mL/min/{1.73_m2} (ref >59)

## 2022-12-03 LAB — LIPID PANEL
Chol/HDL Ratio: 2.7 ratio (ref 0.0–4.4)
Cholesterol, Total: 133 mg/dL (ref 100–199)
HDL: 49 mg/dL (ref >39)
LDL Chol Calc (NIH): 70 mg/dL (ref 0–99)
Triglycerides: 69 mg/dL (ref 0–149)
VLDL Cholesterol Cal: 14 mg/dL (ref 5–40)

## 2022-12-03 NOTE — Progress Notes (Signed)
All labs are normal. No med change

## 2022-12-10 ENCOUNTER — Telehealth: Payer: Self-pay

## 2022-12-10 NOTE — Telephone Encounter (Signed)
-----   Message from Shan Levans sent at 12/03/2022  1:51 PM EST ----- All labs are normal. No med change

## 2022-12-10 NOTE — Telephone Encounter (Signed)
Pt was called and is aware of results, DOB was confirmed.   Interpreter id # [phone]

## 2022-12-24 ENCOUNTER — Ambulatory Visit (INDEPENDENT_AMBULATORY_CARE_PROVIDER_SITE_OTHER): Payer: 59 | Admitting: Physician Assistant

## 2022-12-24 ENCOUNTER — Encounter (HOSPITAL_COMMUNITY): Payer: Self-pay | Admitting: Physician Assistant

## 2022-12-24 VITALS — BP 147/76 | HR 89 | Temp 98.4°F | Ht <= 58 in | Wt 137.0 lb

## 2022-12-24 DIAGNOSIS — F259 Schizoaffective disorder, unspecified: Secondary | ICD-10-CM | POA: Diagnosis not present

## 2022-12-24 DIAGNOSIS — Z79899 Other long term (current) drug therapy: Secondary | ICD-10-CM | POA: Diagnosis not present

## 2022-12-24 MED ORDER — HALOPERIDOL 1 MG PO TABS
1.0000 mg | ORAL_TABLET | Freq: Every day | ORAL | 2 refills | Status: DC
Start: 2022-12-24 — End: 2023-03-25

## 2022-12-24 NOTE — Progress Notes (Signed)
BH MD/PA/NP OP Progress Note  12/24/2022 10:58 AM Cindy Garrison  MRN:  [id number]  Chief Complaint:  Chief Complaint  Patient presents with   Follow-up   Medication Refill   HPI:   Cindy Garrison is a 52 year old, African-American female with a past psychiatric history significant for schizoaffective disorder (unspecified type) who presents to [facility] for follow-up and medication management.  Language interpreter services were utilized during the duration of the encounter.  Patient is currently being managed on the following medication: Haldol 1 mg at bedtime.  Patient endorses good mood.  Patient reports no issues or concerns regarding her use of Haldol.  Patient denies overt depressive symptoms nor does she endorse anxiety.  Patient further denies auditory or visual hallucinations and does not appear to be responding to internal/external stimuli.  Patient also denies paranoia.  In regards to stressors, patient reports that she was recently fired from her current job due to her supervisor.  She reports that her supervisor did not have a reason to fire her and now she is currently working for a different employer [employer]).  Patient is alert and oriented x 4, calm, cooperative, and fully engaged in conversation during the encounter.  Patient endorses good mood.  Patient denies suicidal or homicidal ideations.  He further denies auditory or visual hallucinations and does not appear to be responding to internal/external stimuli.  Patient endorses fair sleep and receives on average 5 to 6 hours of sleep per night.  Patient endorses good appetite and eats on average 3 meals per day.  Patient denies alcohol consumption, tobacco use, or illicit drug use.  Visit Diagnosis:    ICD-10-CM   1. Long term current use of antipsychotic medication  Z79.899 CBC with Differential    HgB A1c    2. Schizoaffective disorder, unspecified type (HCC)   F25.9 haloperidol (HALDOL) 1 MG tablet      Past Psychiatric History:  Schizoaffective disorder.  Patient has been on Haldol for 10 years.  Prior to establishing care with [facility], patient was being seen at [facility]   Past Medical History:  Past Medical History:  Diagnosis Date   #[id number]    Acne    Back pain    Essential hypertension    Seasonal allergies    Syncope 09/16/2014   Syncope and collapse 09/16/2014    Past Surgical History:  Procedure Laterality Date   DILITATION & CURRETTAGE/HYSTROSCOPY WITH NOVASURE ABLATION N/A 12/22/2017   Procedure: DILATATION & CURETTAGE/ HYSTEROSCOPY WITH ENDOMETRIAL FIBROID AND  NOVASURE ABLATION;  Surgeon: Willodean Rosenthal, MD;  Location: [facility];  Service: Gynecology;  Laterality: N/A;    Family Psychiatric History:  Patient denies  Family History:  Family History  Problem Relation Age of Onset   Breast cancer Sister     Social History:  Social History   Socioeconomic History   Marital status: Married    Spouse name: Not on file   Number of children: Not on file   Years of education: Not on file   Highest education level: Not on file  Occupational History   Not on file  Tobacco Use   Smoking status: Never   Smokeless tobacco: Never  Vaping Use   Vaping status: Never Used  Substance and Sexual Activity   Alcohol use: Not Currently    Comment: occ.   Drug use: No   Sexual activity: Not on file    Comment: monogamous female partner  for 5 years (2010)  Other Topics Concern   Not on file  Social History Narrative   Not on file   Social Determinants of Health   Financial Resource Strain: High Risk (05/28/2021)   Overall Financial Resource Strain (CARDIA)    Difficulty of Paying Living Expenses: Very hard  Food Insecurity: Food Insecurity Present (05/28/2021)   Hunger Vital Sign    Worried About Running Out of Food in the Last Year: Often true    Ran Out of Food in the Last Year: Often true   Transportation Needs: No Transportation Needs (03/20/2021)   PRAPARE - Administrator, Civil Service (Medical): No    Lack of Transportation (Non-Medical): No  Physical Activity: Not on file  Stress: Not on file  Social Connections: Not on file    Allergies:  Allergies  Allergen Reactions   Other Rash    Blood pressure pill (unknown name)    Metabolic Disorder Labs: Lab Results  Component Value Date   HGBA1C 5.5 01/24/2018   No results found for: "PROLACTIN" Lab Results  Component Value Date   CHOL 133 12/02/2022   TRIG 69 12/02/2022   HDL 49 12/02/2022   CHOLHDL 2.7 12/02/2022   LDLCALC 70 12/02/2022   LDLCALC 80 05/28/2021   Lab Results  Component Value Date   TSH 1.480 11/11/2021   TSH 1.260 06/20/2019    Therapeutic Level Labs: No results found for: "LITHIUM" No results found for: "VALPROATE" No results found for: "CBMZ"  Current Medications: Current Outpatient Medications  Medication Sig Dispense Refill   acetaminophen (TYLENOL) 500 MG tablet Take 500 mg by mouth every 6 (six) hours as needed for mild pain or fever.     amLODipine (NORVASC) 10 MG tablet Take 1 tablet (10 mg total) by mouth daily. 90 tablet 1   atorvastatin (LIPITOR) 20 MG tablet Take 1 tablet (20 mg total) by mouth daily. 90 tablet 2   carvedilol (COREG) 12.5 MG tablet Take 1 tablet (12.5 mg total) by mouth 2 (two) times daily with a meal. 60 tablet 3   clobetasol cream (TEMOVATE) 0.05 % Apply 1 Application topically 2 (two) times daily. 60 g 1   Diclofenac 35 MG CAPS Take 1 capsule by mouth 2 (two) times daily as needed (shoulder or foot pain). 30 capsule 0   furosemide (LASIX) 20 MG tablet Take 1 daily as needed for swelling 30 tablet 3   haloperidol (HALDOL) 1 MG tablet Take 1 tablet (1 mg total) by mouth at bedtime. 30 tablet 2   levocetirizine (XYZAL) 5 MG tablet Take 1 tablet (5 mg total) by mouth every evening. 90 tablet 1   meloxicam (MOBIC) 15 MG tablet Take 1 tablet (15  mg total) by mouth daily. 60 tablet 1   norethindrone (AYGESTIN) 5 MG tablet Take 1 tablet (5 mg total) by mouth daily. 90 tablet 2   valsartan-hydrochlorothiazide (DIOVAN-HCT) 320-25 MG tablet Take 1 tablet by mouth daily. 90 tablet 3   Vitamin D, Ergocalciferol, (DRISDOL) 1.25 MG (50000 UNIT) CAPS capsule Take 1 capsule (50,000 Units total) by mouth every 7 (seven) days on Wednesday 12 capsule 4   No current facility-administered medications for this visit.     Musculoskeletal: Strength & Muscle Tone: within normal limits Gait & Station: normal Patient leans: N/A  Psychiatric Specialty Exam: Review of Systems  Psychiatric/Behavioral:  Positive for sleep disturbance. Negative for decreased concentration, dysphoric mood, hallucinations, self-injury and suicidal ideas. The patient is not nervous/anxious and  is not hyperactive.     Last menstrual period 09/12/2017.There is no height or weight on file to calculate BMI.  General Appearance: Casual  Eye Contact:  Fair  Speech:  Clear and Coherent and Normal Rate  Volume:  Normal  Mood:  Euthymic  Affect:  Appropriate  Thought Process:  Coherent, Goal Directed, and Descriptions of Associations: Intact  Orientation:  Full (Time, Place, and Person)  Thought Content: WDL   Suicidal Thoughts:  No  Homicidal Thoughts:  No  Memory:  Immediate;   Good Recent;   Good Remote;   Good  Judgement:  Good  Insight:  Good  Psychomotor Activity:  Normal  Concentration:  Concentration: Good and Attention Span: Good  Recall:  Good  Fund of Knowledge: Good  Language: Good  Akathisia:  No  Handed:  Right  AIMS (if indicated): not done  Assets:  Communication Skills Desire for Improvement Financial Resources/Insurance Housing Intimacy Social Support  ADL's:  Intact  Cognition: WNL  Sleep:  Fair   Screenings: AIMS    Flowsheet Row Clinical Support from 01/22/2022 in [facility] Office Visit from 04/01/2020 in  [facility]  AIMS Total Score 2 0      GAD-7    Flowsheet Row Clinical Support from 12/24/2022 in [facility] Clinical Support from 10/15/2022 in [facility] Office Visit from 08/27/2022 in [facility] [place] Clinical Support from 08/13/2022 in [facility] Clinical Support from 06/18/2022 in [facility]  Total GAD-7 Score 0 0 0 0 0      PHQ2-9    Flowsheet Row Clinical Support from 12/24/2022 in [facility] Clinical Support from 10/15/2022 in [facility] Office Visit from 08/27/2022 in [facility] [place] Clinical Support from 08/13/2022 in [facility] Clinical Support from 06/18/2022 in [facility]  PHQ-2 Total Score 0 0 0 0 0      Flowsheet Row Clinical Support from 12/24/2022 in [facility] Clinical Support from 10/15/2022 in [facility] ED from 09/18/2022 in [facility] Emergency Department at [facility]  C-SSRS RISK CATEGORY No Risk No Risk No Risk        Assessment and Plan:   Elmon Else is a 52 year old, African-American female with a past psychiatric history significant for schizoaffective disorder (unspecified type) who presents to [facility] for follow-up and medication management.  Patient presents today encounter reporting no issues or concerns regarding her use of Haldol.  Patient denies overt depressive symptoms nor does she endorse anxiety.  Patient further denies auditory or visual hallucinations and does not appear to be responding to internal/external stimuli.  Patient endorses stability on her current  medication regimen and would like to continue taking her medication as prescribed.  Patient's medication to be e-prescribed to pharmacy of choice.  Provider request for the following labs to be completed by the patient: Hemoglobin A1c and CBC with differential  Collaboration of Care: Collaboration of Care: Medication Management AEB provider managing patient's psychiatric medications, Primary Care Provider AEB patient being seen by a primary care provider, and Psychiatrist AEB patient being followed by mental health provider at this facility  Patient/Guardian was advised Release  of Information must be obtained prior to any record release in order to collaborate their care with an outside provider. Patient/Guardian was advised if they have not already done so to contact the registration department to sign all necessary forms in order for [place] to release information regarding their care.   Consent: Patient/Guardian gives verbal consent for treatment and assignment of benefits for services provided during this visit. Patient/Guardian expressed understanding and agreed to proceed.   1. Schizoaffective disorder, unspecified type (HCC)  - haloperidol (HALDOL) 1 MG tablet; Take 1 tablet (1 mg total) by mouth at bedtime.  Dispense: 30 tablet; Refill: 2  2. Long term current use of antipsychotic medication  - CBC with Differential - HgB A1c  Patient to follow-up in 2 months Provider spent a total of 32 minutes with the patient/reviewing patient's chart  Meta Hatchet, PA 12/24/2022, 10:58 AM

## 2022-12-30 ENCOUNTER — Ambulatory Visit (INDEPENDENT_AMBULATORY_CARE_PROVIDER_SITE_OTHER): Payer: 59

## 2022-12-30 ENCOUNTER — Ambulatory Visit (INDEPENDENT_AMBULATORY_CARE_PROVIDER_SITE_OTHER): Payer: 59 | Admitting: Podiatry

## 2022-12-30 DIAGNOSIS — L84 Corns and callosities: Secondary | ICD-10-CM | POA: Diagnosis not present

## 2022-12-30 DIAGNOSIS — M722 Plantar fascial fibromatosis: Secondary | ICD-10-CM | POA: Diagnosis not present

## 2022-12-30 MED ORDER — METHYLPREDNISOLONE 4 MG PO TBPK: ORAL_TABLET | ORAL | 0 refills | Status: DC

## 2022-12-30 MED ORDER — MELOXICAM 15 MG PO TABS: 15.0000 mg | ORAL_TABLET | Freq: Every day | ORAL | 1 refills | Status: DC

## 2022-12-30 NOTE — Patient Instructions (Addendum)
Look for urea 40% cream or ointment and apply to the thickened dry skin / calluses. This can be bought over the counter, at a pharmacy or online such as [employer].   VISIT SUMMARY:  Today, we discussed your ongoing foot pain and dry skin issues, as well as your hypertension management. We have developed a treatment plan to address each of these concerns.  YOUR PLAN:  -PLANTAR FASCIITIS: Plantar fasciitis is a condition that causes pain in the heel and bottom of the foot due to inflammation of the plantar fascia. We will start a one-week course of Methylprednisolone, tapering the dose as directed. After this, you will take Meloxicam once daily for one month. Additionally, you should follow the home physical therapy plan provided, performing the exercises twice daily.  -DRY SKIN ON FEET: You have dry, hard skin on your big toes. To address this, apply urea cream to the affected areas twice daily.  -HYPERTENSION: Hypertension, or high blood pressure, is being managed with your current medication, Valsartan. Continue taking this medication as prescribed.  INSTRUCTIONS:  Please follow the medication and physical therapy plan as discussed. If your symptoms do not improve or if you experience any side effects, contact our office. Schedule a follow-up appointment in one month to reassess your condition.  Plantar Fasciitis (Heel Spur Syndrome) with Rehab The plantar fascia is a fibrous, ligament-like, soft-tissue structure that spans the bottom of the foot. Plantar fasciitis is a condition that causes pain in the foot due to inflammation of the tissue. SYMPTOMS  Pain and tenderness on the underneath side of the foot. Pain that worsens with standing or walking. CAUSES  Plantar fasciitis is caused by irritation and injury to the plantar fascia on the underneath side of the foot. Common mechanisms of injury include: Direct trauma to bottom of the foot. Damage to a small nerve that runs under the foot  where the main fascia attaches to the heel bone. Stress placed on the plantar fascia due to bone spurs. RISK INCREASES WITH:  Activities that place stress on the plantar fascia (running, jumping, pivoting, or cutting). Poor strength and flexibility. Improperly fitted shoes. Tight calf muscles. Flat feet. Failure to warm-up properly before activity. Obesity. PREVENTION Warm up and stretch properly before activity. Allow for adequate recovery between workouts. Maintain physical fitness: Strength, flexibility, and endurance. Cardiovascular fitness. Maintain a health body weight. Avoid stress on the plantar fascia. Wear properly fitted shoes, including arch supports for individuals who have flat feet.  PROGNOSIS  If treated properly, then the symptoms of plantar fasciitis usually resolve without surgery. However, occasionally surgery is necessary.  RELATED COMPLICATIONS  Recurrent symptoms that may result in a chronic condition. Problems of the lower back that are caused by compensating for the injury, such as limping. Pain or weakness of the foot during push-off following surgery. Chronic inflammation, scarring, and partial or complete fascia tear, occurring more often from repeated injections.  TREATMENT  Treatment initially involves the use of ice and medication to help reduce pain and inflammation. The use of strengthening and stretching exercises may help reduce pain with activity, especially stretches of the Achilles tendon. These exercises may be performed at home or with a therapist. Your caregiver may recommend that you use heel cups of arch supports to help reduce stress on the plantar fascia. Occasionally, corticosteroid injections are given to reduce inflammation. If symptoms persist for greater than 6 months despite non-surgical (conservative), then surgery may be recommended.   MEDICATION  If pain medication  is necessary, then nonsteroidal anti-inflammatory medications,  such as aspirin and ibuprofen, or other minor pain relievers, such as acetaminophen, are often recommended. Do not take pain medication within 7 days before surgery. Prescription pain relievers may be given if deemed necessary by your caregiver. Use only as directed and only as much as you need. Corticosteroid injections may be given by your caregiver. These injections should be reserved for the most serious cases, because they may only be given a certain number of times.  HEAT AND COLD Cold treatment (icing) relieves pain and reduces inflammation. Cold treatment should be applied for 10 to 15 minutes every 2 to 3 hours for inflammation and pain and immediately after any activity that aggravates your symptoms. Use ice packs or massage the area with a piece of ice (ice massage). Heat treatment may be used prior to performing the stretching and strengthening activities prescribed by your caregiver, physical therapist, or athletic trainer. Use a heat pack or soak the injury in warm water.  SEEK IMMEDIATE MEDICAL CARE IF: Treatment seems to offer no benefit, or the condition worsens. Any medications produce adverse side effects.  EXERCISES- RANGE OF MOTION (ROM) AND STRETCHING EXERCISES - Plantar Fasciitis (Heel Spur Syndrome) These exercises may help you when beginning to rehabilitate your injury. Your symptoms may resolve with or without further involvement from your physician, physical therapist or athletic trainer. While completing these exercises, remember:  Restoring tissue flexibility helps normal motion to return to the joints. This allows healthier, less painful movement and activity. An effective stretch should be held for at least 30 seconds. A stretch should never be painful. You should only feel a gentle lengthening or release in the stretched tissue.  RANGE OF MOTION - Toe Extension, Flexion Sit with your right / left leg crossed over your opposite knee. Grasp your toes and gently  pull them back toward the top of your foot. You should feel a stretch on the bottom of your toes and/or foot. Hold this stretch for 10 seconds. Now, gently pull your toes toward the bottom of your foot. You should feel a stretch on the top of your toes and or foot. Hold this stretch for 10 seconds. Repeat  times. Complete this stretch 3 times per day.   RANGE OF MOTION - Ankle Dorsiflexion, Active Assisted Remove shoes and sit on a chair that is preferably not on a carpeted surface. Place right / left foot under knee. Extend your opposite leg for support. Keeping your heel down, slide your right / left foot back toward the chair until you feel a stretch at your ankle or calf. If you do not feel a stretch, slide your bottom forward to the edge of the chair, while still keeping your heel down. Hold this stretch for 10 seconds. Repeat 3 times. Complete this stretch 2 times per day.   STRETCH  Gastroc, Standing Place hands on wall. Extend right / left leg, keeping the front knee somewhat bent. Slightly point your toes inward on your back foot. Keeping your right / left heel on the floor and your knee straight, shift your weight toward the wall, not allowing your back to arch. You should feel a gentle stretch in the right / left calf. Hold this position for 10 seconds. Repeat 3 times. Complete this stretch 2 times per day.  STRETCH  Soleus, Standing Place hands on wall. Extend right / left leg, keeping the other knee somewhat bent. Slightly point your toes inward on your back foot.  Keep your right / left heel on the floor, bend your back knee, and slightly shift your weight over the back leg so that you feel a gentle stretch deep in your back calf. Hold this position for 10 seconds. Repeat 3 times. Complete this stretch 2 times per day.  STRETCH  Gastrocsoleus, Standing  Note: This exercise can place a lot of stress on your foot and ankle. Please complete this exercise only if specifically  instructed by your caregiver.  Place the ball of your right / left foot on a step, keeping your other foot firmly on the same step. Hold on to the wall or a rail for balance. Slowly lift your other foot, allowing your body weight to press your heel down over the edge of the step. You should feel a stretch in your right / left calf. Hold this position for 10 seconds. Repeat this exercise with a slight bend in your right / left knee. Repeat 3 times. Complete this stretch 2 times per day.   STRENGTHENING EXERCISES - Plantar Fasciitis (Heel Spur Syndrome)  These exercises may help you when beginning to rehabilitate your injury. They may resolve your symptoms with or without further involvement from your physician, physical therapist or athletic trainer. While completing these exercises, remember:  Muscles can gain both the endurance and the strength needed for everyday activities through controlled exercises. Complete these exercises as instructed by your physician, physical therapist or athletic trainer. Progress the resistance and repetitions only as guided.  STRENGTH - Towel Curls Sit in a chair positioned on a non-carpeted surface. Place your foot on a towel, keeping your heel on the floor. Pull the towel toward your heel by only curling your toes. Keep your heel on the floor. Repeat 3 times. Complete this exercise 2 times per day.  STRENGTH - Ankle Inversion Secure one end of a rubber exercise band/tubing to a fixed object (table, pole). Loop the other end around your foot just before your toes. Place your fists between your knees. This will focus your strengthening at your ankle. Slowly, pull your big toe up and in, making sure the band/tubing is positioned to resist the entire motion. Hold this position for 10 seconds. Have your muscles resist the band/tubing as it slowly pulls your foot back to the starting position. Repeat 3 times. Complete this exercises 2 times per day.  Document  Released: 01/04/2005 Document Revised: 03/29/2011 Document Reviewed: 04/18/2008 [facility] Patient Information 2014 [place], [place].

## 2022-12-31 NOTE — Progress Notes (Signed)
  Subjective:  Patient ID: Cindy Garrison, female    DOB: 1970-07-16,  MRN: [id number]  No chief complaint on file.   Discussed the use of AI scribe software for clinical note transcription with the patient, who gave verbal consent to proceed.  History of Present Illness   The patient, a 52 year old individual with a history of hypertension, presents with bilateral foot pain, described as feeling like a wound, although no visible wound is present. The pain is located on the underside of both feet, extending from the toes to the heels. The patient reports that the pain is especially pronounced when standing, which is a significant part of her daily routine due to her occupation. The patient also notes that the big toes on both feet are dry and hard, contributing to the discomfort.  In addition to the foot pain, the patient has been experiencing issues with dry, rough skin on the feet. Despite taking valsartan for hypertension, no other medications have been taken for these symptoms. The patient has not previously had any imaging or treatment for these foot issues.  The patient was prescribed meloxicam by her primary care physician on a previous date. However, the patient reports that the foot pain and skin condition have persisted despite this treatment.   An interpreter is present and translates today via [place].          Objective:    Physical Exam   MUSCULOSKELETAL: Tenderness on palpation of the plantar heel and plantar fascia. Pes planus deformity, mild hallux valgus deformity, and callus formation on the medial and plantar aspects of the hallux bilaterally. SKIN: Dry skin on the feet.       No images are attached to the encounter.    Results   RADIOLOGY Foot Bilateral X-ray: No heel spur, soft tissue inflammation, arthritis in foot (12/30/2022)      Assessment:   1. Callus of foot   2. Plantar fasciitis of left foot   3. Plantar fasciitis of right foot      Plan:   Patient was evaluated and treated and all questions answered.  Assessment and Plan    Plantar Fasciitis   She presents with bilateral heel pain, exacerbated by standing. An X-ray ruled out heel spurs but revealed some arthritis. Palpation confirmed pain in the arch and plantar fascia. We will start a one-week course of Methylprednisolone, tapering the dose as directed. Following this, she will begin Meloxicam once daily for one month. A home physical therapy plan, to be performed twice daily, has been provided. We also discussed injection therapy and she will return for this if not improving.  Dry Skin on Feet   She has dry, hard skin on her big toes. Urea cream will be applied to the affected areas twice daily. .          No follow-ups on file.

## 2023-01-31 ENCOUNTER — Ambulatory Visit (HOSPITAL_COMMUNITY): Admission: EM | Admit: 2023-01-31 | Discharge: 2023-01-31 | Discharge status: Against Medical Advice | Payer: 59

## 2023-02-10 ENCOUNTER — Encounter: Payer: Self-pay | Admitting: Critical Care Medicine

## 2023-02-15 DIAGNOSIS — Z79899 Other long term (current) drug therapy: Secondary | ICD-10-CM | POA: Diagnosis not present

## 2023-02-16 LAB — CBC WITH DIFFERENTIAL/PLATELET
Basophils Absolute: 0 10*3/uL (ref 0.0–0.2)
Basos: 1 %
EOS (ABSOLUTE): 0.1 10*3/uL (ref 0.0–0.4)
Eos: 2 %
Hematocrit: 42.2 % (ref 34.0–46.6)
Hemoglobin: 14.4 g/dL (ref 11.1–15.9)
Immature Grans (Abs): 0 10*3/uL (ref 0.0–0.1)
Immature Granulocytes: 0 %
Lymphocytes Absolute: 2.6 10*3/uL (ref 0.7–3.1)
Lymphs: 37 %
MCH: 27.5 pg (ref 26.6–33.0)
MCHC: 34.1 g/dL (ref 31.5–35.7)
MCV: 81 fL (ref 79–97)
Monocytes Absolute: 0.6 10*3/uL (ref 0.1–0.9)
Monocytes: 9 %
Neutrophils Absolute: 3.7 10*3/uL (ref 1.4–7.0)
Neutrophils: 51 %
Platelets: 238 10*3/uL (ref 150–450)
RBC: 5.23 x10E6/uL (ref 3.77–5.28)
RDW: 12.8 % (ref 11.7–15.4)
WBC: 7.1 10*3/uL (ref 3.4–10.8)

## 2023-02-16 LAB — HEMOGLOBIN A1C
Est. average glucose Bld gHb Est-mCnc: 120 mg/dL
Hgb A1c MFr Bld: 5.8 % — ABNORMAL HIGH (ref 4.8–5.6)

## 2023-02-24 ENCOUNTER — Encounter: Payer: Self-pay | Admitting: Critical Care Medicine

## 2023-03-25 ENCOUNTER — Ambulatory Visit (INDEPENDENT_AMBULATORY_CARE_PROVIDER_SITE_OTHER): Payer: 59 | Admitting: Physician Assistant

## 2023-03-25 ENCOUNTER — Encounter (HOSPITAL_COMMUNITY): Payer: Self-pay | Admitting: Physician Assistant

## 2023-03-25 DIAGNOSIS — F259 Schizoaffective disorder, unspecified: Secondary | ICD-10-CM

## 2023-03-25 MED ORDER — HALOPERIDOL 1 MG PO TABS
1.0000 mg | ORAL_TABLET | Freq: Every day | ORAL | 3 refills | Status: DC
Start: 2023-03-25 — End: 2023-06-24

## 2023-03-25 NOTE — Progress Notes (Signed)
 BH MD/PA/NP OP Progress Note  03/25/2023 5:49 PM Cindy Garrison  MRN:  [id number]  Chief Complaint:  Chief Complaint  Patient presents with   Follow-up   Medication Refill   HPI:   Cindy Garrison is a 53 year old, African-American female with a past psychiatric history significant for schizoaffective disorder (unspecified type) who presents to [facility] for follow-up and medication management.  Language interpreter services were utilized during the duration of the encounter.  Patient is currently being managed on the following medication: Haldol 1 mg at bedtime.  Patient reports no issues or concerns regarding her current medication regimen.  Patient denies experiencing any adverse side effects at this time.  Patient denies overt depressive symptoms nor does she endorse anxiety.  Patient denies auditory or visual hallucinations and does not appear to be responding to internal/external stimuli.  Patient endorses stressors related to current place of employ.  She reports that her current place of work is going on grateful thinks that her.  She reports that they recently docked her vacation time and paying her for her time.  A GAD-7 screen was performed with the patient scoring a 2.  Patient is alert and oriented x 4, calm, cooperative, and fully engaged in conversation during the encounter.  Patient endorses good mood.  Patient exhibits euthymic mood with appropriate affect.  Patient denies suicidal or homicidal ideations.  She further denies auditory or visual hallucinations and does not appear to be responding to internal/external stimuli.  Patient endorses fair sleep and receives on average 5 to 6 hours of sleep per night.  Patient endorses good appetite and eats on average 3 meals per day.  Patient denies alcohol consumption, tobacco use, or illicit drug use.  Visit Diagnosis:    ICD-10-CM   1. Schizoaffective disorder, unspecified type (HCC)   F25.9 haloperidol (HALDOL) 1 MG tablet      Past Psychiatric History:  Schizoaffective disorder.  Patient has been on Haldol for 10 years.  Prior to establishing care with [facility], patient was being seen at [facility]   Past Medical History:  Past Medical History:  Diagnosis Date   #[id number]    Acne    Back pain    Essential hypertension    Seasonal allergies    Syncope 09/16/2014   Syncope and collapse 09/16/2014    Past Surgical History:  Procedure Laterality Date   DILITATION & CURRETTAGE/HYSTROSCOPY WITH NOVASURE ABLATION N/A 12/22/2017   Procedure: DILATATION & CURETTAGE/ HYSTEROSCOPY WITH ENDOMETRIAL FIBROID AND  NOVASURE ABLATION;  Surgeon: Willodean Rosenthal, MD;  Location: [facility];  Service: Gynecology;  Laterality: N/A;    Family Psychiatric History:  Patient denies  Family History:  Family History  Problem Relation Age of Onset   Breast cancer Sister     Social History:  Social History   Socioeconomic History   Marital status: Married    Spouse name: Not on file   Number of children: Not on file   Years of education: Not on file   Highest education level: Not on file  Occupational History   Not on file  Tobacco Use   Smoking status: Never   Smokeless tobacco: Never  Vaping Use   Vaping status: Never Used  Substance and Sexual Activity   Alcohol use: Not Currently    Comment: occ.   Drug use: No   Sexual activity: Not on file    Comment: monogamous female partner for 5 years (2010)  Other  Topics Concern   Not on file  Social History Narrative   Not on file   Social Drivers of Health   Financial Resource Strain: High Risk (05/28/2021)   Overall Financial Resource Strain (CARDIA)    Difficulty of Paying Living Expenses: Very hard  Food Insecurity: Food Insecurity Present (05/28/2021)   Hunger Vital Sign    Worried About Running Out of Food in the Last Year: Often true    Ran Out of Food in the Last Year: Often true   Transportation Needs: No Transportation Needs (03/20/2021)   PRAPARE - Administrator, Civil Service (Medical): No    Lack of Transportation (Non-Medical): No  Physical Activity: Not on file  Stress: Not on file  Social Connections: Not on file    Allergies:  Allergies  Allergen Reactions   Other Rash    Blood pressure pill (unknown name)    Metabolic Disorder Labs: Lab Results  Component Value Date   HGBA1C 5.8 (H) 02/15/2023   No results found for: "PROLACTIN" Lab Results  Component Value Date   CHOL 133 12/02/2022   TRIG 69 12/02/2022   HDL 49 12/02/2022   CHOLHDL 2.7 12/02/2022   LDLCALC 70 12/02/2022   LDLCALC 80 05/28/2021   Lab Results  Component Value Date   TSH 1.480 11/11/2021   TSH 1.260 06/20/2019    Therapeutic Level Labs: No results found for: "LITHIUM" No results found for: "VALPROATE" No results found for: "CBMZ"  Current Medications: Current Outpatient Medications  Medication Sig Dispense Refill   acetaminophen (TYLENOL) 500 MG tablet Take 500 mg by mouth every 6 (six) hours as needed for mild pain or fever.     amLODipine (NORVASC) 10 MG tablet Take 1 tablet (10 mg total) by mouth daily. 90 tablet 1   atorvastatin (LIPITOR) 20 MG tablet Take 1 tablet (20 mg total) by mouth daily. 90 tablet 2   carvedilol (COREG) 12.5 MG tablet Take 1 tablet (12.5 mg total) by mouth 2 (two) times daily with a meal. 60 tablet 3   clobetasol cream (TEMOVATE) 0.05 % Apply 1 Application topically 2 (two) times daily. 60 g 1   Diclofenac 35 MG CAPS Take 1 capsule by mouth 2 (two) times daily as needed (shoulder or foot pain). 30 capsule 0   furosemide (LASIX) 20 MG tablet Take 1 daily as needed for swelling 30 tablet 3   haloperidol (HALDOL) 1 MG tablet Take 1 tablet (1 mg total) by mouth at bedtime. 30 tablet 3   levocetirizine (XYZAL) 5 MG tablet Take 1 tablet (5 mg total) by mouth every evening. 90 tablet 1   meloxicam (MOBIC) 15 MG tablet Take 1 tablet  (15 mg total) by mouth daily. 60 tablet 1   methylPREDNISolone (MEDROL DOSEPAK) 4 MG TBPK tablet 6 day dose pack - take as directed 21 tablet 0   norethindrone (AYGESTIN) 5 MG tablet Take 1 tablet (5 mg total) by mouth daily. 90 tablet 2   valsartan-hydrochlorothiazide (DIOVAN-HCT) 320-25 MG tablet Take 1 tablet by mouth daily. 90 tablet 3   Vitamin D, Ergocalciferol, (DRISDOL) 1.25 MG (50000 UNIT) CAPS capsule Take 1 capsule (50,000 Units total) by mouth every 7 (seven) days on Wednesday 12 capsule 4   No current facility-administered medications for this visit.     Musculoskeletal: Strength & Muscle Tone: within normal limits Gait & Station: normal Patient leans: N/A  Psychiatric Specialty Exam: Review of Systems  Psychiatric/Behavioral:  Positive for sleep disturbance. Negative for  decreased concentration, dysphoric mood, hallucinations, self-injury and suicidal ideas. The patient is not nervous/anxious and is not hyperactive.     Blood pressure (!) 160/89, pulse 86, height 4\' 9"  (1.448 m), weight 135 lb 6.4 oz (61.4 kg), last menstrual period 09/12/2017, SpO2 100%.Body mass index is 29.3 kg/m.  General Appearance: Casual  Eye Contact:  Fair  Speech:  Clear and Coherent and Normal Rate  Volume:  Normal  Mood:  Euthymic  Affect:  Appropriate  Thought Process:  Coherent, Goal Directed, and Descriptions of Associations: Intact  Orientation:  Full (Time, Place, and Person)  Thought Content: WDL   Suicidal Thoughts:  No  Homicidal Thoughts:  No  Memory:  Immediate;   Good Recent;   Good Remote;   Good  Judgement:  Good  Insight:  Good  Psychomotor Activity:  Normal  Concentration:  Concentration: Good and Attention Span: Good  Recall:  Good  Fund of Knowledge: Good  Language: Good  Akathisia:  No  Handed:  Right  AIMS (if indicated): not done  Assets:  Communication Skills Desire for Improvement Financial Resources/Insurance Housing Intimacy Social Support  ADL's:   Intact  Cognition: WNL  Sleep:  Fair   Screenings: AIMS    Flowsheet Row Clinical Support from 01/22/2022 in [facility] Office Visit from 04/01/2020 in [facility]  AIMS Total Score 2 0      GAD-7    Flowsheet Row Clinical Support from 03/25/2023 in [facility] Clinical Support from 12/24/2022 in [facility] Clinical Support from 10/15/2022 in [facility] Office Visit from 08/27/2022 in [facility] [place] [facility] [place] Clinical Support from 08/13/2022 in [facility]  Total GAD-7 Score 2 0 0 0 0      PHQ2-9    Flowsheet Row Clinical Support from 03/25/2023 in [facility] Clinical Support from 12/24/2022 in [facility] Clinical Support from 10/15/2022 in [facility] Office Visit from 08/27/2022 in [facility] [place] [facility] [place]. [facility] Clinical Support from 08/13/2022 in [facility]  PHQ-2 Total Score 0 0 0 0 0      Flowsheet Row Clinical Support from 03/25/2023 in [facility] Clinical Support from 12/24/2022 in [facility] Clinical Support from 10/15/2022 in [facility]  C-SSRS RISK CATEGORY No Risk No Risk No Risk        Assessment and Plan:   Elmon Else is a 53 year old, African-American female with a past psychiatric history significant for schizoaffective disorder (unspecified type) who presents to [facility] for follow-up and medication management.  Patient reports no issues or concerns regarding her current medication regimen.  Patient denies experiencing any adverse side effects and further denies  the need for dosage adjustments at this time.  Despite ongoing issues with her work, patient denies overt depressive symptoms nor does she endorse anxiety.  Patient endorses stability on her current medication regimen and would like to continue taking her medication as prescribed.  Patient's medication to be e-prescribed to pharmacy of choice.  Collaboration of Care: Collaboration of Care: Medication Management AEB provider managing patient's psychiatric medications, Primary Care Provider AEB patient being seen by a primary care provider, and Psychiatrist AEB patient being  followed by mental health provider at this facility  Patient/Guardian was advised Release of Information must be obtained prior to any record release in order to collaborate their care with an outside provider. Patient/Guardian was advised if they have not already done so to contact the registration department to sign all necessary forms in order for [place] to release information regarding their care.   Consent: Patient/Guardian gives verbal consent for treatment and assignment of benefits for services provided during this visit. Patient/Guardian expressed understanding and agreed to proceed.   1. Schizoaffective disorder, unspecified type (HCC)  - haloperidol (HALDOL) 1 MG tablet; Take 1 tablet (1 mg total) by mouth at bedtime.  Dispense: 30 tablet; Refill: 3  Patient to follow-up in 2 months Provider spent a total of 43 minutes with the patient/reviewing patient's chart  Meta Hatchet, PA 03/25/2023, 5:49 PM

## 2023-06-02 ENCOUNTER — Encounter: Payer: Self-pay | Admitting: Internal Medicine

## 2023-06-02 ENCOUNTER — Ambulatory Visit: Payer: MEDICAID | Attending: Internal Medicine | Admitting: Internal Medicine

## 2023-06-02 VITALS — BP 128/73 | HR 85 | Temp 98.1°F | Ht <= 58 in | Wt 142.0 lb

## 2023-06-02 DIAGNOSIS — Z1211 Encounter for screening for malignant neoplasm of colon: Secondary | ICD-10-CM

## 2023-06-02 DIAGNOSIS — M722 Plantar fascial fibromatosis: Secondary | ICD-10-CM | POA: Diagnosis not present

## 2023-06-02 DIAGNOSIS — N857 Hematometra: Secondary | ICD-10-CM

## 2023-06-02 DIAGNOSIS — E7800 Pure hypercholesterolemia, unspecified: Secondary | ICD-10-CM

## 2023-06-02 DIAGNOSIS — I10 Essential (primary) hypertension: Secondary | ICD-10-CM | POA: Diagnosis not present

## 2023-06-02 DIAGNOSIS — E559 Vitamin D deficiency, unspecified: Secondary | ICD-10-CM

## 2023-06-02 DIAGNOSIS — Z1231 Encounter for screening mammogram for malignant neoplasm of breast: Secondary | ICD-10-CM

## 2023-06-02 DIAGNOSIS — R600 Localized edema: Secondary | ICD-10-CM

## 2023-06-02 MED ORDER — VITAMIN D (ERGOCALCIFEROL) 1.25 MG (50000 UNIT) PO CAPS: 50000.0000 [IU] | ORAL_CAPSULE | ORAL | 4 refills | Status: DC

## 2023-06-02 MED ORDER — AMLODIPINE BESYLATE 5 MG PO TABS: 5.0000 mg | ORAL_TABLET | Freq: Every day | ORAL | 1 refills | Status: DC

## 2023-06-02 MED ORDER — NORETHINDRONE ACETATE 5 MG PO TABS
5.0000 mg | ORAL_TABLET | Freq: Every day | ORAL | 1 refills | Status: DC
Start: 2023-06-02 — End: 2023-10-03

## 2023-06-02 NOTE — Progress Notes (Signed)
 Patient ID: Cindy Garrison, female    DOB: 07-Nov-1970  MRN: [id number]  CC: Establish Care (Est care (Dr.Wright's pt) - HTN /Bilateral foot pain, swelling X1 mo - current meds are not working)   Subjective: Cindy Garrison is a 53 y.o. female who presents for chronic ds management. Previous PCP was Dr. Brent Cambric who has retired.  Her concerns today include:  Pt with hx of  HTN, allergies, schizoaffective disorder, HL, Vit D def, IDA d/t uterine fibroid, preDM  AMN Language interpreter used during this encounter. #[id number], Celine  Pt has meds with her today HTN: Currently on amlodipine  10 mg daily, carvedilol  12.5 mg twice a day, Diovan /HCTZ 320/25 mg twice a day and furosemide  as needed.  She has these medicines with her and reports taking.  Tries to limit salt in the foods.  Has swelling in the legs. HL: She is taking and tolerating atorvastatin  20 mg daily.  Complains of pain in her feet and knees times several months.  She is on meloxicam  which she states does not help.  Saw the podiatrist and was diagnosed with plantar fasciitis.  Had x-rays of both feet revealed no heel spurs but did show some arthritis changes.  She was given a course of methylprednisolone  taper in December and home physical therapy plan to be twice a day was discussed at that visit.  The podiatrist states that the next step would be injection therapy if she was not improving with these measures.  Schizoaffective disorder: She is plugged in with behavioral health and feels she is doing okay on her medications.  History of vitamin D  deficiency.  Needs refill on vitamin D .  She has history of uterine fibroid and hematometra. Placed on Aygestin  by gyn Dr. Adriana Hopping in 2023.  Requesting refill. Patient Active Problem List   Diagnosis Date Noted   Foot callus 12/02/2022   Chronic dental pain 08/04/2021   Acne vulgaris 05/28/2021   At moderate risk for fall 05/28/2021   Food insecurity 05/28/2021   Financial insecurity  05/28/2021   Language barrier affecting health care, [place] is not well understood 05/28/2021   Hematometra 03/20/2021   Ovarian mass 11/10/2020   Cervical os stenosis 11/10/2020   History of endometrial ablation 04/09/2020   Screening mammogram for breast cancer 04/09/2020   Cervical cancer screening 01/23/2020   Pure hypercholesterolemia 01/23/2020   Colon cancer screening 01/23/2020   Vitamin D  deficiency 01/23/2020   Fibroid uterus 03/26/2015   Schizoaffective disorder (HCC) 09/16/2014   Essential hypertension    Allergic rhinitis 09/13/2006     Current Outpatient Medications on File Prior to Visit  Medication Sig Dispense Refill   amLODipine  (NORVASC ) 10 MG tablet Take 1 tablet (10 mg total) by mouth daily. 90 tablet 1   atorvastatin  (LIPITOR) 20 MG tablet Take 1 tablet (20 mg total) by mouth daily. 90 tablet 2   carvedilol  (COREG ) 12.5 MG tablet Take 1 tablet (12.5 mg total) by mouth 2 (two) times daily with a meal. 60 tablet 3   furosemide  (LASIX ) 20 MG tablet Take 1 daily as needed for swelling 30 tablet 3   haloperidol  (HALDOL ) 1 MG tablet Take 1 tablet (1 mg total) by mouth at bedtime. 30 tablet 3   levocetirizine (XYZAL ) 5 MG tablet Take 1 tablet (5 mg total) by mouth every evening. 90 tablet 1   meloxicam  (MOBIC ) 15 MG tablet Take 1 tablet (15 mg total) by mouth daily. 60 tablet 1   norethindrone  (AYGESTIN ) 5 MG  tablet Take 1 tablet (5 mg total) by mouth daily. 90 tablet 2   valsartan -hydrochlorothiazide  (DIOVAN -HCT) 320-25 MG tablet Take 1 tablet by mouth daily. 90 tablet 3   Vitamin D , Ergocalciferol , (DRISDOL ) 1.25 MG (50000 UNIT) CAPS capsule Take 1 capsule (50,000 Units total) by mouth every 7 (seven) days on Wednesday 12 capsule 4   acetaminophen  (TYLENOL ) 500 MG tablet Take 500 mg by mouth every 6 (six) hours as needed for mild pain or fever. (Patient not taking: Reported on 06/02/2023)     clobetasol  cream (TEMOVATE ) 0.05 % Apply 1 Application topically 2 (two) times  daily. (Patient not taking: Reported on 06/02/2023) 60 g 1   Diclofenac  35 MG CAPS Take 1 capsule by mouth 2 (two) times daily as needed (shoulder or foot pain). (Patient not taking: Reported on 06/02/2023) 30 capsule 0   methylPREDNISolone  (MEDROL  DOSEPAK) 4 MG TBPK tablet 6 day dose pack - take as directed (Patient not taking: Reported on 06/02/2023) 21 tablet 0   [DISCONTINUED] hydrochlorothiazide  (HYDRODIURIL ) 25 MG tablet Take 1 tablet (25 mg total) by mouth daily. 30 tablet 2   [DISCONTINUED] lisinopril  (ZESTRIL ) 40 MG tablet TAKE 1 TABLET (40 MG TOTAL) BY MOUTH DAILY. TO LOWER BLOOD PRESSURE 30 tablet 2   [DISCONTINUED] omeprazole  (PRILOSEC) 40 MG capsule Take 1 capsule (40 mg total) by mouth 2 (two) times daily before a meal. 60 capsule 2   No current facility-administered medications on file prior to visit.    Allergies  Allergen Reactions   Other Rash    Blood pressure pill (unknown name)    Social History   Socioeconomic History   Marital status: Married    Spouse name: Not on file   Number of children: Not on file   Years of education: Not on file   Highest education level: Not on file  Occupational History   Not on file  Tobacco Use   Smoking status: Never   Smokeless tobacco: Never  Vaping Use   Vaping status: Never Used  Substance and Sexual Activity   Alcohol use: Not Currently    Comment: occ.   Drug use: No   Sexual activity: Not on file    Comment: monogamous female partner for 5 years (2010)  Other Topics Concern   Not on file  Social History Narrative   Not on file   Social Drivers of Health   Financial Resource Strain: High Risk (05/28/2021)   Overall Financial Resource Strain (CARDIA)    Difficulty of Paying Living Expenses: Very hard  Food Insecurity: Food Insecurity Present (05/28/2021)   Hunger Vital Sign    Worried About Running Out of Food in the Last Year: Often true    Ran Out of Food in the Last Year: Often true  Transportation Needs: No  Transportation Needs (03/20/2021)   PRAPARE - Administrator, Civil Service (Medical): No    Lack of Transportation (Non-Medical): No  Physical Activity: Not on file  Stress: Not on file  Social Connections: Not on file  Intimate Partner Violence: Not on file    Family History  Problem Relation Age of Onset   Breast cancer Sister     Past Surgical History:  Procedure Laterality Date   DILITATION & CURRETTAGE/HYSTROSCOPY WITH NOVASURE ABLATION N/A 12/22/2017   Procedure: DILATATION & CURETTAGE/ HYSTEROSCOPY WITH ENDOMETRIAL FIBROID AND  NOVASURE ABLATION;  Surgeon: Lenord Radon, MD;  Location: [facility];  Service: Gynecology;  Laterality: N/A;    ROS: Review of Systems  Negative except as stated above  PHYSICAL EXAM: BP 128/73 (BP Location: Left Arm, Patient Position: Sitting, Cuff Size: Normal)   Pulse 85   Temp 98.1 F (36.7 C) (Oral)   Ht 4\' 9"  (1.448 m)   Wt 142 lb (64.4 kg)   LMP 09/12/2017 (LMP Unknown)   SpO2 96%   BMI 30.73 kg/m   Physical Exam  General appearance - alert, well appearing, and in no distress Mental status - normal mood, behavior, speech, dress, motor activity, and thought processes Chest - clear to auscultation, no wheezes, rales or rhonchi, symmetric air entry Heart - normal rate, regular rhythm, normal S1, S2, no murmurs, rubs, clicks or gallops Musculoskeletal - Feet:  no point tenderness on palpation of soles Extremities - 1+ BL LE edema      Latest Ref Rng & Units 12/02/2022   10:38 AM 09/18/2022    6:21 AM 06/23/2022   10:48 AM  CMP  Glucose 70 - 99 mg/dL 82  88  76   BUN 6 - 24 mg/dL 17  4  7    Creatinine 0.57 - 1.00 mg/dL 4.40  1.02  7.25   Sodium 134 - 144 mmol/L 141  139  140   Potassium 3.5 - 5.2 mmol/L 3.8  3.5  4.5   Chloride 96 - 106 mmol/L 103  103  102   CO2 20 - 29 mmol/L 19   26   Calcium  8.7 - 10.2 mg/dL 9.0   9.3   Total Protein 6.0 - 8.5 g/dL 7.4     Total Bilirubin 0.0 - 1.2 mg/dL 0.3     Alkaline  Phos 44 - 121 IU/L 48     AST 0 - 40 IU/L 30     ALT 0 - 32 IU/L 28      Lipid Panel     Component Value Date/Time   CHOL 133 12/02/2022 1038   TRIG 69 12/02/2022 1038   HDL 49 12/02/2022 1038   CHOLHDL 2.7 12/02/2022 1038   LDLCALC 70 12/02/2022 1038    CBC    Component Value Date/Time   WBC 7.1 02/15/2023 0809   WBC 7.0 01/01/2021 1805   RBC 5.23 02/15/2023 0809   RBC 5.08 01/01/2021 1805   HGB 14.4 02/15/2023 0809   HCT 42.2 02/15/2023 0809   PLT 238 02/15/2023 0809   MCV 81 02/15/2023 0809   MCH 27.5 02/15/2023 0809   MCH 25.8 (L) 01/01/2021 1805   MCHC 34.1 02/15/2023 0809   MCHC 34.3 01/01/2021 1805   RDW 12.8 02/15/2023 0809   LYMPHSABS 2.6 02/15/2023 0809   MONOABS 0.8 01/01/2021 1805   EOSABS 0.1 02/15/2023 0809   BASOSABS 0.0 02/15/2023 0809    ASSESSMENT AND PLAN: 1. Plantar fasciitis (Primary) Will get her back with the podiatrist - Ambulatory referral to Podiatry, abdominal.  2. Essential hypertension Goal but she is having lower extremity edema most likely due to amlodipine .  Will decrease the dose of amlodipine  to 5 mg daily.  Use the furosemide  as needed. - Basic Metabolic Panel - amLODipine  (NORVASC ) 5 MG tablet; Take 1 tablet (5 mg total) by mouth daily. Dose decrease  Dispense: 90 tablet; Refill: 1  3. Pure hypercholesterolemia Continue atorvastatin  20 mg daily.  4. Hematometra - norethindrone  (AYGESTIN ) 5 MG tablet; Take 1 tablet (5 mg total) by mouth daily.  Dispense: 90 tablet; Refill: 1  5. Encounter for screening mammogram for malignant neoplasm of breast - MM 3D SCREENING MAMMOGRAM BILATERAL BREAST; Future  6. Screening for colon cancer Discussed colon cancer screening methods.  She prefers to do the fit test. - Fecal occult blood, imunochemical([facility])  7. Edema leg See #2 above - Brain natriuretic peptide - Basic Metabolic Panel  8. Vitamin D  deficiency Refill given on high-dose vitamin D .  Will check vitamin D   level today. - Vitamin D , Ergocalciferol , (DRISDOL ) 1.25 MG (50000 UNIT) CAPS capsule; Take 1 capsule (50,000 Units total) by mouth every 7 (seven) days on Wednesday  Dispense: 12 capsule; Refill: 4 - VITAMIN D  25 Hydroxy (Vit-D Deficiency, Fractures)    Patient was given the opportunity to ask questions.  Patient verbalized understanding of the plan and was able to repeat key elements of the plan.   This documentation was completed using Paediatric nurse.  Any transcriptional errors are unintentional.  No orders of the defined types were placed in this encounter.    Requested Prescriptions   Pending Prescriptions Disp Refills   norethindrone  (AYGESTIN ) 5 MG tablet 90 tablet 2    Sig: Take 1 tablet (5 mg total) by mouth daily.    No follow-ups on file.  Concetta Dee, MD, FACP

## 2023-06-02 NOTE — Patient Instructions (Signed)
 Decrease dose of Amlodipine  to 5 mg daily. Update prescription set to your pharmacy. Continue to limit salt in your foods. I have resubmitted referral for you to follow up with the foot doctor for the inflammation in your feet.

## 2023-06-04 ENCOUNTER — Ambulatory Visit: Payer: Self-pay | Admitting: Internal Medicine

## 2023-06-04 LAB — BASIC METABOLIC PANEL WITH GFR
BUN/Creatinine Ratio: 10 (ref 9–23)
BUN: 6 mg/dL (ref 6–24)
CO2: 21 mmol/L (ref 20–29)
Calcium: 9 mg/dL (ref 8.7–10.2)
Chloride: 102 mmol/L (ref 96–106)
Creatinine, Ser: 0.59 mg/dL (ref 0.57–1.00)
Glucose: 75 mg/dL (ref 70–99)
Potassium: 3.7 mmol/L (ref 3.5–5.2)
Sodium: 140 mmol/L (ref 134–144)
eGFR: 106 mL/min/{1.73_m2} (ref >59)

## 2023-06-04 LAB — BRAIN NATRIURETIC PEPTIDE: BNP: 7.7 pg/mL (ref 0.0–100.0)

## 2023-06-04 LAB — VITAMIN D 25 HYDROXY (VIT D DEFICIENCY, FRACTURES): Vit D, 25-Hydroxy: 39 ng/mL (ref 30.0–100.0)

## 2023-06-08 ENCOUNTER — Ambulatory Visit: Payer: MEDICAID | Admitting: Podiatry

## 2023-06-09 DIAGNOSIS — Z1211 Encounter for screening for malignant neoplasm of colon: Secondary | ICD-10-CM | POA: Diagnosis not present

## 2023-06-10 LAB — FECAL OCCULT BLOOD, IMMUNOCHEMICAL: Fecal Occult Bld: NEGATIVE

## 2023-06-14 ENCOUNTER — Ambulatory Visit (INDEPENDENT_AMBULATORY_CARE_PROVIDER_SITE_OTHER): Payer: MEDICAID | Admitting: Podiatry

## 2023-06-14 DIAGNOSIS — M722 Plantar fascial fibromatosis: Secondary | ICD-10-CM | POA: Diagnosis not present

## 2023-06-14 MED ORDER — METHYLPREDNISOLONE 4 MG PO TBPK: ORAL_TABLET | ORAL | 0 refills | Status: DC

## 2023-06-14 NOTE — Patient Instructions (Addendum)
 For instructions on how to put on your Plantar Fascial Brace, please visit [email or web address]  Plantar Fasciitis (Heel Spur Syndrome) with Rehab The plantar fascia is a fibrous, ligament-like, soft-tissue structure that spans the bottom of the foot. Plantar fasciitis is a condition that causes pain in the foot due to inflammation of the tissue. SYMPTOMS  Pain and tenderness on the underneath side of the foot. Pain that worsens with standing or walking. CAUSES  Plantar fasciitis is caused by irritation and injury to the plantar fascia on the underneath side of the foot. Common mechanisms of injury include: Direct trauma to bottom of the foot. Damage to a small nerve that runs under the foot where the main fascia attaches to the heel bone. Stress placed on the plantar fascia due to bone spurs. RISK INCREASES WITH:  Activities that place stress on the plantar fascia (running, jumping, pivoting, or cutting). Poor strength and flexibility. Improperly fitted shoes. Tight calf muscles. Flat feet. Failure to warm-up properly before activity. Obesity. PREVENTION Warm up and stretch properly before activity. Allow for adequate recovery between workouts. Maintain physical fitness: Strength, flexibility, and endurance. Cardiovascular fitness. Maintain a health body weight. Avoid stress on the plantar fascia. Wear properly fitted shoes, including arch supports for individuals who have flat feet.  PROGNOSIS  If treated properly, then the symptoms of plantar fasciitis usually resolve without surgery. However, occasionally surgery is necessary.  RELATED COMPLICATIONS  Recurrent symptoms that may result in a chronic condition. Problems of the lower back that are caused by compensating for the injury, such as limping. Pain or weakness of the foot during push-off following surgery. Chronic inflammation, scarring, and partial or complete fascia tear, occurring more often from repeated  injections.  TREATMENT  Treatment initially involves the use of ice and medication to help reduce pain and inflammation. The use of strengthening and stretching exercises may help reduce pain with activity, especially stretches of the Achilles tendon. These exercises may be performed at home or with a therapist. Your caregiver may recommend that you use heel cups of arch supports to help reduce stress on the plantar fascia. Occasionally, corticosteroid injections are given to reduce inflammation. If symptoms persist for greater than 6 months despite non-surgical (conservative), then surgery may be recommended.   MEDICATION  If pain medication is necessary, then nonsteroidal anti-inflammatory medications, such as aspirin and ibuprofen , or other minor pain relievers, such as acetaminophen , are often recommended. Do not take pain medication within 7 days before surgery. Prescription pain relievers may be given if deemed necessary by your caregiver. Use only as directed and only as much as you need. Corticosteroid injections may be given by your caregiver. These injections should be reserved for the most serious cases, because they may only be given a certain number of times.  HEAT AND COLD Cold treatment (icing) relieves pain and reduces inflammation. Cold treatment should be applied for 10 to 15 minutes every 2 to 3 hours for inflammation and pain and immediately after any activity that aggravates your symptoms. Use ice packs or massage the area with a piece of ice (ice massage). Heat treatment may be used prior to performing the stretching and strengthening activities prescribed by your caregiver, physical therapist, or athletic trainer. Use a heat pack or soak the injury in warm water.  SEEK IMMEDIATE MEDICAL CARE IF: Treatment seems to offer no benefit, or the condition worsens. Any medications produce adverse side effects.  EXERCISES- RANGE OF MOTION (ROM) AND STRETCHING EXERCISES - Plantar  Fasciitis (  Heel Spur Syndrome) These exercises may help you when beginning to rehabilitate your injury. Your symptoms may resolve with or without further involvement from your physician, physical therapist or athletic trainer. While completing these exercises, remember:  Restoring tissue flexibility helps normal motion to return to the joints. This allows healthier, less painful movement and activity. An effective stretch should be held for at least 30 seconds. A stretch should never be painful. You should only feel a gentle lengthening or release in the stretched tissue.  RANGE OF MOTION - Toe Extension, Flexion Sit with your right / left leg crossed over your opposite knee. Grasp your toes and gently pull them back toward the top of your foot. You should feel a stretch on the bottom of your toes and/or foot. Hold this stretch for 10 seconds. Now, gently pull your toes toward the bottom of your foot. You should feel a stretch on the top of your toes and or foot. Hold this stretch for 10 seconds. Repeat  times. Complete this stretch 3 times per day.   RANGE OF MOTION - Ankle Dorsiflexion, Active Assisted Remove shoes and sit on a chair that is preferably not on a carpeted surface. Place right / left foot under knee. Extend your opposite leg for support. Keeping your heel down, slide your right / left foot back toward the chair until you feel a stretch at your ankle or calf. If you do not feel a stretch, slide your bottom forward to the edge of the chair, while still keeping your heel down. Hold this stretch for 10 seconds. Repeat 3 times. Complete this stretch 2 times per day.   STRETCH  Gastroc, Standing Place hands on wall. Extend right / left leg, keeping the front knee somewhat bent. Slightly point your toes inward on your back foot. Keeping your right / left heel on the floor and your knee straight, shift your weight toward the wall, not allowing your back to arch. You should feel a  gentle stretch in the right / left calf. Hold this position for 10 seconds. Repeat 3 times. Complete this stretch 2 times per day.  STRETCH  Soleus, Standing Place hands on wall. Extend right / left leg, keeping the other knee somewhat bent. Slightly point your toes inward on your back foot. Keep your right / left heel on the floor, bend your back knee, and slightly shift your weight over the back leg so that you feel a gentle stretch deep in your back calf. Hold this position for 10 seconds. Repeat 3 times. Complete this stretch 2 times per day.  STRETCH  Gastrocsoleus, Standing  Note: This exercise can place a lot of stress on your foot and ankle. Please complete this exercise only if specifically instructed by your caregiver.  Place the ball of your right / left foot on a step, keeping your other foot firmly on the same step. Hold on to the wall or a rail for balance. Slowly lift your other foot, allowing your body weight to press your heel down over the edge of the step. You should feel a stretch in your right / left calf. Hold this position for 10 seconds. Repeat this exercise with a slight bend in your right / left knee. Repeat 3 times. Complete this stretch 2 times per day.   STRENGTHENING EXERCISES - Plantar Fasciitis (Heel Spur Syndrome)  These exercises may help you when beginning to rehabilitate your injury. They may resolve your symptoms with or without further involvement from  your physician, physical therapist or athletic trainer. While completing these exercises, remember:  Muscles can gain both the endurance and the strength needed for everyday activities through controlled exercises. Complete these exercises as instructed by your physician, physical therapist or athletic trainer. Progress the resistance and repetitions only as guided.  STRENGTH - Towel Curls Sit in a chair positioned on a non-carpeted surface. Place your foot on a towel, keeping your heel on the  floor. Pull the towel toward your heel by only curling your toes. Keep your heel on the floor. Repeat 3 times. Complete this exercise 2 times per day.  STRENGTH - Ankle Inversion Secure one end of a rubber exercise band/tubing to a fixed object (table, pole). Loop the other end around your foot just before your toes. Place your fists between your knees. This will focus your strengthening at your ankle. Slowly, pull your big toe up and in, making sure the band/tubing is positioned to resist the entire motion. Hold this position for 10 seconds. Have your muscles resist the band/tubing as it slowly pulls your foot back to the starting position. Repeat 3 times. Complete this exercises 2 times per day.  Document Released: 01/04/2005 Document Revised: 03/29/2011 Document Reviewed: 04/18/2008 [facility] Patient Information 2014 , [place].                           Contains text generated by [employer].       Contains text generated by [employer].                                 Contains text generated by [employer].

## 2023-06-14 NOTE — Progress Notes (Unsigned)
 Cindy Garrison

## 2023-06-24 ENCOUNTER — Ambulatory Visit (INDEPENDENT_AMBULATORY_CARE_PROVIDER_SITE_OTHER): Payer: MEDICAID | Admitting: Physician Assistant

## 2023-06-24 ENCOUNTER — Encounter (HOSPITAL_COMMUNITY): Payer: Self-pay | Admitting: Physician Assistant

## 2023-06-24 DIAGNOSIS — F259 Schizoaffective disorder, unspecified: Secondary | ICD-10-CM

## 2023-06-24 MED ORDER — HALOPERIDOL 1 MG PO TABS: 1.0000 mg | ORAL_TABLET | Freq: Every day | ORAL | 3 refills | Status: DC

## 2023-06-24 NOTE — Progress Notes (Signed)
 [facility] MD/PA/NP OP Progress Note  06/24/2023 10:45 AM Cindy Garrison  MRN:  [id number]  Chief Complaint:  Chief Complaint  Patient presents with   Follow-up   Medication Refill   HPI:   Cindy Garrison is a 53 year old, African-American female with a past psychiatric history significant for schizoaffective disorder (unspecified type) who presents to [facility] for follow-up and medication management.  Language interpreter services were utilized during the duration of the encounter.  Patient is currently being managed on the following medication: Haldol  1 mg at bedtime.  Patient reports that she is doing good and denies any issues or concerns today.  Patient reports that she has been taking her medications regularly and denies experiencing any adverse side effects.  Patient denies overt depressive symptoms nor does she endorse anxiety.  She denies changes in her behavior and further denies auditory or visual hallucinations.  A PHQ-9 screen was performed with the patient scoring a 0.  A GAD-7 screen was also performed with the patient scoring a 0.  Patient is alert and oriented x 4, calm, cooperative, and fully engaged in conversation during the encounter.  Patient endorses good mood.  Patient exhibits euthymic mood with appropriate affect.  Patient denies suicidal or homicidal ideations.  She further denies auditory or visual hallucinations and does not appear to be responding to internal/external stimuli.  Patient endorses fair sleep and receives on average 4 to 5 hours of sleep per night.  She denies having issues with her sleep but states that she does not have time to sleep for 8 hours.  Patient endorses good appetite and eats on average 3 meals per day.  Patient denies alcohol consumption, tobacco use, or illicit drug use.  Visit Diagnosis:    ICD-10-CM   1. Schizoaffective disorder, unspecified type (HCC)  F25.9       Past Psychiatric History:   Schizoaffective disorder.  Patient has been on Haldol  for 10 years.  Prior to establishing care with [facility], patient was being seen at [facility]   Past Medical History:  Past Medical History:  Diagnosis Date   #[id number]    Acne    Back pain    Essential hypertension    Seasonal allergies    Syncope 09/16/2014   Syncope and collapse 09/16/2014    Past Surgical History:  Procedure Laterality Date   DILITATION & CURRETTAGE/HYSTROSCOPY WITH NOVASURE ABLATION N/A 12/22/2017   Procedure: DILATATION & CURETTAGE/ HYSTEROSCOPY WITH ENDOMETRIAL FIBROID AND  NOVASURE ABLATION;  Surgeon: Lenord Radon, MD;  Location: [facility];  Service: Gynecology;  Laterality: N/A;    Family Psychiatric History:  Patient denies  Family History:  Family History  Problem Relation Age of Onset   Breast cancer Sister     Social History:  Social History   Socioeconomic History   Marital status: Married    Spouse name: Not on file   Number of children: Not on file   Years of education: Not on file   Highest education level: Not on file  Occupational History   Not on file  Tobacco Use   Smoking status: Never   Smokeless tobacco: Never  Vaping Use   Vaping status: Never Used  Substance and Sexual Activity   Alcohol use: Not Currently    Comment: occ.   Drug use: No   Sexual activity: Not on file    Comment: monogamous female partner for 5 years (2010)  Other Topics Concern   Not  on file  Social History Narrative   Not on file   Social Drivers of Health   Financial Resource Strain: High Risk (05/28/2021)   Overall Financial Resource Strain (CARDIA)    Difficulty of Paying Living Expenses: Very hard  Food Insecurity: Food Insecurity Present (05/28/2021)   Hunger Vital Sign    Worried About Running Out of Food in the Last Year: Often true    Ran Out of Food in the Last Year: Often true  Transportation Needs: No Transportation Needs (03/20/2021)   PRAPARE -  Administrator, Civil Service (Medical): No    Lack of Transportation (Non-Medical): No  Physical Activity: Not on file  Stress: Not on file  Social Connections: Not on file    Allergies:  Allergies  Allergen Reactions   Other Rash    Blood pressure pill (unknown name)    Metabolic Disorder Labs: Lab Results  Component Value Date   HGBA1C 5.8 (H) 02/15/2023   No results found for: PROLACTIN Lab Results  Component Value Date   CHOL 133 12/02/2022   TRIG 69 12/02/2022   HDL 49 12/02/2022   CHOLHDL 2.7 12/02/2022   LDLCALC 70 12/02/2022   LDLCALC 80 05/28/2021   Lab Results  Component Value Date   TSH 1.480 11/11/2021   TSH 1.260 06/20/2019    Therapeutic Level Labs: No results found for: LITHIUM No results found for: VALPROATE No results found for: CBMZ  Current Medications: Current Outpatient Medications  Medication Sig Dispense Refill   acetaminophen  (TYLENOL ) 500 MG tablet Take 500 mg by mouth every 6 (six) hours as needed for mild pain or fever. (Patient not taking: Reported on 06/02/2023)     amLODipine  (NORVASC ) 5 MG tablet Take 1 tablet (5 mg total) by mouth daily. Dose decrease 90 tablet 1   atorvastatin  (LIPITOR) 20 MG tablet Take 1 tablet (20 mg total) by mouth daily. 90 tablet 2   carvedilol  (COREG ) 12.5 MG tablet Take 1 tablet (12.5 mg total) by mouth 2 (two) times daily with a meal. 60 tablet 3   clobetasol  cream (TEMOVATE ) 0.05 % Apply 1 Application topically 2 (two) times daily. (Patient not taking: Reported on 06/02/2023) 60 g 1   Diclofenac  35 MG CAPS Take 1 capsule by mouth 2 (two) times daily as needed (shoulder or foot pain). (Patient not taking: Reported on 06/02/2023) 30 capsule 0   furosemide  (LASIX ) 20 MG tablet Take 1 daily as needed for swelling 30 tablet 3   haloperidol  (HALDOL ) 1 MG tablet Take 1 tablet (1 mg total) by mouth at bedtime. 30 tablet 3   levocetirizine (XYZAL ) 5 MG tablet Take 1 tablet (5 mg total) by mouth  every evening. 90 tablet 1   meloxicam  (MOBIC ) 15 MG tablet Take 1 tablet (15 mg total) by mouth daily. 60 tablet 1   methylPREDNISolone  (MEDROL  DOSEPAK) 4 MG TBPK tablet Take as directed 21 tablet 0   norethindrone  (AYGESTIN ) 5 MG tablet Take 1 tablet (5 mg total) by mouth daily. 90 tablet 1   valsartan -hydrochlorothiazide  (DIOVAN -HCT) 320-25 MG tablet Take 1 tablet by mouth daily. 90 tablet 3   Vitamin D , Ergocalciferol , (DRISDOL ) 1.25 MG (50000 UNIT) CAPS capsule Take 1 capsule (50,000 Units total) by mouth every 7 (seven) days on Wednesday 12 capsule 4   No current facility-administered medications for this visit.     Musculoskeletal: Strength & Muscle Tone: within normal limits Gait & Station: normal Patient leans: N/A  Psychiatric Specialty Exam: Review of  Systems  Psychiatric/Behavioral:  Positive for sleep disturbance. Negative for decreased concentration, dysphoric mood, hallucinations, self-injury and suicidal ideas. The patient is not nervous/anxious and is not hyperactive.     Blood pressure (!) 151/83, pulse 88, temperature 98.3 F (36.8 C), temperature source Oral, height 4' 9 (1.448 m), weight 140 lb 6.4 oz (63.7 kg), last menstrual period 09/12/2017, SpO2 99%.Body mass index is 30.38 kg/m.  General Appearance: Casual  Eye Contact:  Fair  Speech:  Clear and Coherent and Normal Rate  Volume:  Normal  Mood:  Euthymic  Affect:  Appropriate  Thought Process:  Coherent, Goal Directed, and Descriptions of Associations: Intact  Orientation:  Full (Time, Place, and Person)  Thought Content: WDL   Suicidal Thoughts:  No  Homicidal Thoughts:  No  Memory:  Immediate;   Good Recent;   Good Remote;   Good  Judgement:  Good  Insight:  Good  Psychomotor Activity:  Normal  Concentration:  Concentration: Good and Attention Span: Good  Recall:  Good  Fund of Knowledge: Good  Language: Good  Akathisia:  No  Handed:  Right  AIMS (if indicated): not done  Assets:   Communication Skills Desire for Improvement Financial Resources/Insurance Housing Intimacy Social Support  ADL's:  Intact  Cognition: WNL  Sleep:  Fair   Screenings: AIMS    Flowsheet Row Clinical Support from 06/24/2023 in [facility] Clinical Support from 01/22/2022 in [facility] Office Visit from 04/01/2020 in [facility]  AIMS Total Score 0 2 0      GAD-7    Flowsheet Row Clinical Support from 06/24/2023 in [facility] Clinical Support from 03/25/2023 in [facility] Clinical Support from 12/24/2022 in [facility] Clinical Support from 10/15/2022 in [facility] Office Visit from 08/27/2022 in [facility] [place]  Total GAD-7 Score 0 2 0 0 0      PHQ2-9    Flowsheet Row Clinical Support from 06/24/2023 in [facility] Clinical Support from 03/25/2023 in [facility] Clinical Support from 12/24/2022 in [facility] Clinical Support from 10/15/2022 in [facility] Office Visit from 08/27/2022 in [facility] [place] [facility] [place]  PHQ-2 Total Score 0 0 0 0 0      Flowsheet Row Clinical Support from 06/24/2023 in [facility] Clinical Support from 03/25/2023 in [facility] Clinical Support from 12/24/2022 in [facility]  C-SSRS RISK CATEGORY No Risk No Risk No Risk        Assessment and Plan:   Cindy Garrison is a 53 year old, African-American female with a past psychiatric history significant for schizoaffective disorder (unspecified type) who presents to [facility]  for follow-up and medication management.  Patient reports that she is taking her medication regularly and denies experiencing any adverse side effects at this time.  An aims assessment was performed with the patient scoring of 0.  Patient denied experiencing overt depressive symptoms nor does she endorse anxiety.  She denies changes in her behavior and does not appear to be responding to internal/external stimuli.  A PHQ-9 screen was performed with the patient scoring a 0.  A GAD-7 screen was also performed  with the patient scoring a 0.  Patient endorses stability on her current medication regimen and would like to continue taking her medication as prescribed.  Patient's medication to be e-prescribed to pharmacy of choice.  Patient denies suicidal ideations and is able to contract for safety following the conclusion of the encounter.  Collaboration of Care: Collaboration of Care: Medication Management AEB provider managing patient's psychiatric medications, Primary Care Provider AEB patient being seen by a primary care provider, and Psychiatrist AEB patient being followed by mental health provider at this facility  Patient/Guardian was advised Release of Information must be obtained prior to any record release in order to collaborate their care with an outside provider. Patient/Guardian was advised if they have not already done so to contact the registration department to sign all necessary forms in order for us  to release information regarding their care.   Consent: Patient/Guardian gives verbal consent for treatment and assignment of benefits for services provided during this visit. Patient/Guardian expressed understanding and agreed to proceed.   1. Schizoaffective disorder, unspecified type (HCC)  - haloperidol  (HALDOL ) 1 MG tablet; Take 1 tablet (1 mg total) by mouth at bedtime.  Dispense: 30 tablet; Refill: 3  Patient to follow-up in 3 months Provider spent a total of 15 minutes with the  patient/reviewing patient's chart  Gates Kasal, PA 06/24/2023, 10:45 AM

## 2023-07-04 NOTE — Progress Notes (Signed)
 S:    Interpreter: Lalone, #[id number]  No chief complaint on file.  53 y.o. female who presents for hypertension evaluation, education, and management.   Patient was referred and last seen by Primary Care Provider, Dr. Lincoln Renshaw, on 06/02/2023. At that visit, BP was at goal of < 130/80 with a BP of 128/73. However, she was reporting significant LE edema, so amlodipine  was decreased to 5 mg daily and she was instructed to use furosemide  as needed.    PMH is significant for HTN, allergies, schizoaffective disorder, HLD, vitamin D  deficiency, IDA, preDM.   Today, patient arrives in good spirits and presents without assistance. Denies dizziness, headache, blurred vision, swelling. Does report swelling and pain in legs when standing at work but this does not happen at rest. Of note, she also reports neck and chest pain that has been long standing but comes and goes. Unable to get accurate description of pain but she reports increased pain when turning her neck, getting up from standing, and using muscles. Says that is is a deep pain.   Family history: none pertinent Social history: never smoker, no current alcohol use   Medication adherence suboptimal -- missed two weeks of medications and restarted taking yesterday due to insurance issues. Patient has taken BP medications today.   Current antihypertensives include: amlodipine  5 mg daily, carvedilol  12.5 mg BID, furosemide  20 mg PRN, valsartan -hydrochlorothiazide  320-25 mg daily   Antihypertensives tried in the past include: lisinopril   Reported home BP readings: doesn't check  ASCVD risk factors include: HTN, HLD  O:  Vitals:   07/05/23 1052  BP: 129/82  Pulse: 69     Last 3 Office BP readings: BP Readings from Last 3 Encounters:  06/24/23 (!) 151/83  06/02/23 128/73  03/25/23 (!) 160/89    BMET    Component Value Date/Time   NA 140 06/02/2023 1044   K 3.7 06/02/2023 1044   CL 102 06/02/2023 1044   CO2 21 06/02/2023 1044    GLUCOSE 75 06/02/2023 1044   GLUCOSE 88 09/18/2022 0621   BUN 6 06/02/2023 1044   CREATININE 0.59 06/02/2023 1044   CREATININE 0.66 02/23/2016 0924   CALCIUM  9.0 06/02/2023 1044   GFRNONAA >60 01/01/2021 1805   GFRNONAA >89 02/23/2016 0924   GFRAA 107 06/20/2019 1102   GFRAA >89 02/23/2016 0924    Renal function: CrCl cannot be calculated (Patient's most recent lab result is older than the maximum 21 days allowed.).  Clinical ASCVD: No  The 10-year ASCVD risk score (Arnett DK, et al., 2019) is: 6.7%   Values used to calculate the score:     Age: 21 years     Clincally relevant sex: Female     Is Non-Hispanic African American: Yes     Diabetic: No     Tobacco smoker: No     Systolic Blood Pressure: 151 mmHg     Is BP treated: Yes     HDL Cholesterol: 49 mg/dL     Total Cholesterol: 133 mg/dL  A/P: Hypertension diagnosed currently controlled on current medications. BP goal < 130/80 mmHg. Medication adherence appears suboptimal due to insurance issues but patient reports having supply of her medications. -Continue amlodipine  5 mg daily -Continue carvedilol  12.5 mg BID, furosemide  PRN, and valsartan -hydrochlorothiazide  320-25 mg daily -Patient educated on purpose, proper use, and potential adverse effects of amlodipine , carvedilol , furosemide , valsartan -hydrochlorothiazide .  -F/u labs ordered - none ordered -Counseled on lifestyle modifications for blood pressure control including reduced dietary sodium, increased exercise,  adequate sleep. -Encouraged patient to check BP at home and bring log of readings to next visit. Counseled on proper use of home BP cuff.   Results reviewed and written information provided.    Written patient instructions provided. Patient verbalized understanding of treatment plan.  Total time in face to face counseling 30 minutes.    Follow-up:  Pharmacist in 6 weeks PCP clinic visit 10/03/2023  Juleen Oakland, PharmD PGY1 Pharmacy Resident

## 2023-07-05 ENCOUNTER — Ambulatory Visit: Payer: MEDICAID | Attending: Family Medicine | Admitting: Pharmacist

## 2023-07-05 ENCOUNTER — Encounter: Payer: Self-pay | Admitting: Pharmacist

## 2023-07-05 VITALS — BP 129/82 | HR 69

## 2023-07-05 DIAGNOSIS — I10 Essential (primary) hypertension: Secondary | ICD-10-CM

## 2023-07-15 ENCOUNTER — Ambulatory Visit (INDEPENDENT_AMBULATORY_CARE_PROVIDER_SITE_OTHER): Payer: MEDICAID | Admitting: Podiatry

## 2023-07-15 DIAGNOSIS — M722 Plantar fascial fibromatosis: Secondary | ICD-10-CM

## 2023-07-15 MED ORDER — METHYLPREDNISOLONE 4 MG PO TBPK: ORAL_TABLET | ORAL | 0 refills | Status: DC

## 2023-07-15 NOTE — Patient Instructions (Addendum)
 For instructions on how to put on your Plantar Fascial Brace, please visit [email or web address]   --  Plantar Fasciitis (Heel Spur Syndrome) with Rehab The plantar fascia is a fibrous, ligament-like, soft-tissue structure that spans the bottom of the foot. Plantar fasciitis is a condition that causes pain in the foot due to inflammation of the tissue. SYMPTOMS  Pain and tenderness on the underneath side of the foot. Pain that worsens with standing or walking. CAUSES  Plantar fasciitis is caused by irritation and injury to the plantar fascia on the underneath side of the foot. Common mechanisms of injury include: Direct trauma to bottom of the foot. Damage to a small nerve that runs under the foot where the main fascia attaches to the heel bone. Stress placed on the plantar fascia due to bone spurs. RISK INCREASES WITH:  Activities that place stress on the plantar fascia (running, jumping, pivoting, or cutting). Poor strength and flexibility. Improperly fitted shoes. Tight calf muscles. Flat feet. Failure to warm-up properly before activity. Obesity. PREVENTION Warm up and stretch properly before activity. Allow for adequate recovery between workouts. Maintain physical fitness: Strength, flexibility, and endurance. Cardiovascular fitness. Maintain a health body weight. Avoid stress on the plantar fascia. Wear properly fitted shoes, including arch supports for individuals who have flat feet.  PROGNOSIS  If treated properly, then the symptoms of plantar fasciitis usually resolve without surgery. However, occasionally surgery is necessary.  RELATED COMPLICATIONS  Recurrent symptoms that may result in a chronic condition. Problems of the lower back that are caused by compensating for the injury, such as limping. Pain or weakness of the foot during push-off following surgery. Chronic inflammation, scarring, and partial or complete fascia tear, occurring more often from repeated  injections.  TREATMENT  Treatment initially involves the use of ice and medication to help reduce pain and inflammation. The use of strengthening and stretching exercises may help reduce pain with activity, especially stretches of the Achilles tendon. These exercises may be performed at home or with a therapist. Your caregiver may recommend that you use heel cups of arch supports to help reduce stress on the plantar fascia. Occasionally, corticosteroid injections are given to reduce inflammation. If symptoms persist for greater than 6 months despite non-surgical (conservative), then surgery may be recommended.   MEDICATION  If pain medication is necessary, then nonsteroidal anti-inflammatory medications, such as aspirin and ibuprofen , or other minor pain relievers, such as acetaminophen , are often recommended. Do not take pain medication within 7 days before surgery. Prescription pain relievers may be given if deemed necessary by your caregiver. Use only as directed and only as much as you need. Corticosteroid injections may be given by your caregiver. These injections should be reserved for the most serious cases, because they may only be given a certain number of times.  HEAT AND COLD Cold treatment (icing) relieves pain and reduces inflammation. Cold treatment should be applied for 10 to 15 minutes every 2 to 3 hours for inflammation and pain and immediately after any activity that aggravates your symptoms. Use ice packs or massage the area with a piece of ice (ice massage). Heat treatment may be used prior to performing the stretching and strengthening activities prescribed by your caregiver, physical therapist, or athletic trainer. Use a heat pack or soak the injury in warm water.  SEEK IMMEDIATE MEDICAL CARE IF: Treatment seems to offer no benefit, or the condition worsens. Any medications produce adverse side effects.  EXERCISES- RANGE OF MOTION (ROM) AND STRETCHING EXERCISES -  Plantar  Fasciitis (Heel Spur Syndrome) These exercises may help you when beginning to rehabilitate your injury. Your symptoms may resolve with or without further involvement from your physician, physical therapist or athletic trainer. While completing these exercises, remember:  Restoring tissue flexibility helps normal motion to return to the joints. This allows healthier, less painful movement and activity. An effective stretch should be held for at least 30 seconds. A stretch should never be painful. You should only feel a gentle lengthening or release in the stretched tissue.  RANGE OF MOTION - Toe Extension, Flexion Sit with your right / left leg crossed over your opposite knee. Grasp your toes and gently pull them back toward the top of your foot. You should feel a stretch on the bottom of your toes and/or foot. Hold this stretch for 10 seconds. Now, gently pull your toes toward the bottom of your foot. You should feel a stretch on the top of your toes and or foot. Hold this stretch for 10 seconds. Repeat  times. Complete this stretch 3 times per day.   RANGE OF MOTION - Ankle Dorsiflexion, Active Assisted Remove shoes and sit on a chair that is preferably not on a carpeted surface. Place right / left foot under knee. Extend your opposite leg for support. Keeping your heel down, slide your right / left foot back toward the chair until you feel a stretch at your ankle or calf. If you do not feel a stretch, slide your bottom forward to the edge of the chair, while still keeping your heel down. Hold this stretch for 10 seconds. Repeat 3 times. Complete this stretch 2 times per day.   STRETCH  Gastroc, Standing Place hands on wall. Extend right / left leg, keeping the front knee somewhat bent. Slightly point your toes inward on your back foot. Keeping your right / left heel on the floor and your knee straight, shift your weight toward the wall, not allowing your back to arch. You should feel a  gentle stretch in the right / left calf. Hold this position for 10 seconds. Repeat 3 times. Complete this stretch 2 times per day.  STRETCH  Soleus, Standing Place hands on wall. Extend right / left leg, keeping the other knee somewhat bent. Slightly point your toes inward on your back foot. Keep your right / left heel on the floor, bend your back knee, and slightly shift your weight over the back leg so that you feel a gentle stretch deep in your back calf. Hold this position for 10 seconds. Repeat 3 times. Complete this stretch 2 times per day.  STRETCH  Gastrocsoleus, Standing  Note: This exercise can place a lot of stress on your foot and ankle. Please complete this exercise only if specifically instructed by your caregiver.  Place the ball of your right / left foot on a step, keeping your other foot firmly on the same step. Hold on to the wall or a rail for balance. Slowly lift your other foot, allowing your body weight to press your heel down over the edge of the step. You should feel a stretch in your right / left calf. Hold this position for 10 seconds. Repeat this exercise with a slight bend in your right / left knee. Repeat 3 times. Complete this stretch 2 times per day.   STRENGTHENING EXERCISES - Plantar Fasciitis (Heel Spur Syndrome)  These exercises may help you when beginning to rehabilitate your injury. They may resolve your symptoms with or without  further involvement from your physician, physical therapist or athletic trainer. While completing these exercises, remember:  Muscles can gain both the endurance and the strength needed for everyday activities through controlled exercises. Complete these exercises as instructed by your physician, physical therapist or athletic trainer. Progress the resistance and repetitions only as guided.  STRENGTH - Towel Curls Sit in a chair positioned on a non-carpeted surface. Place your foot on a towel, keeping your heel on the  floor. Pull the towel toward your heel by only curling your toes. Keep your heel on the floor. Repeat 3 times. Complete this exercise 2 times per day.  STRENGTH - Ankle Inversion Secure one end of a rubber exercise band/tubing to a fixed object (table, pole). Loop the other end around your foot just before your toes. Place your fists between your knees. This will focus your strengthening at your ankle. Slowly, pull your big toe up and in, making sure the band/tubing is positioned to resist the entire motion. Hold this position for 10 seconds. Have your muscles resist the band/tubing as it slowly pulls your foot back to the starting position. Repeat 3 times. Complete this exercises 2 times per day.  Document Released: 01/04/2005 Document Revised: 03/29/2011 Document Reviewed: 04/18/2008 [facility] Patient Information 2014 [place], [place]. 53

## 2023-07-15 NOTE — Progress Notes (Signed)
 Subjective: Chief Complaint  Patient presents with   Plantar Fasciitis    RM#12 Follow up bilateral foot pain when standing still hurting in the left foot reeducated on brace use today.   Female presents the office today for follow evaluation of bilateral foot pain.  Overall states that she is doing better.  She states that she forgot how to put the plantar fascia braces on and she had to be shown how to do this.  She has not been stretching.  She states that she also did not take the steroids as she states the pharmacy did not have them.  No injuries that she reports.  Objective: AAO x3, NAD- states she does not need a translator today  DP/PT pulses palpable bilaterally, CRT less than 3 seconds There is tenderness on the left side worse than the right.  There is mild discomfort on the plantar aspect calcaneus at the insertion of the plantar fascia and also the arch of the foot.  There is no area pinpoint tenderness.  Flexor, extensor tendons intact. No pain with calf compression, swelling, warmth, erythema  Assessment: Plantar fasciitis  Plan: -All treatment options discussed with the patient including all alternatives, risks, complications.  -I recent the Medrol  Dosepak for her.  I discussed ensuring that she stretch exercises. We showed her how to use the plantar fascia braces as well and provided a link for online video occasions to reference this.  Discussed supportive shoe gear. -States that she will call if there is any issues in the future. -Patient encouraged to call the office with any questions, concerns, change in symptoms.   Donnice JONELLE Fees DPM

## 2023-08-15 ENCOUNTER — Telehealth: Payer: Self-pay | Admitting: General Practice

## 2023-08-15 NOTE — Telephone Encounter (Signed)
 Called pt to confirm appt for 7/29 unable to LVM

## 2023-08-16 ENCOUNTER — Ambulatory Visit: Payer: MEDICAID | Attending: Internal Medicine | Admitting: Pharmacist

## 2023-08-16 ENCOUNTER — Encounter: Payer: Self-pay | Admitting: Pharmacist

## 2023-08-16 VITALS — BP 138/81 | HR 70

## 2023-08-16 DIAGNOSIS — R609 Edema, unspecified: Secondary | ICD-10-CM

## 2023-08-16 DIAGNOSIS — K089 Disorder of teeth and supporting structures, unspecified: Secondary | ICD-10-CM

## 2023-08-16 DIAGNOSIS — G8929 Other chronic pain: Secondary | ICD-10-CM | POA: Diagnosis not present

## 2023-08-16 DIAGNOSIS — I10 Essential (primary) hypertension: Secondary | ICD-10-CM | POA: Diagnosis not present

## 2023-08-16 MED ORDER — VALSARTAN-HYDROCHLOROTHIAZIDE 320-25 MG PO TABS: 1.0000 | ORAL_TABLET | Freq: Every day | ORAL | 3 refills | Status: AC

## 2023-08-16 MED ORDER — CARVEDILOL 12.5 MG PO TABS: 12.5000 mg | ORAL_TABLET | Freq: Two times a day (BID) | ORAL | 3 refills | Status: DC

## 2023-08-16 MED ORDER — FUROSEMIDE 20 MG PO TABS: ORAL_TABLET | ORAL | 3 refills | Status: AC

## 2023-08-16 NOTE — Progress Notes (Signed)
 S:    Interpreter: Lalone, #[id number]  No chief complaint on file.  53 y.o. female who presents for hypertension evaluation, education, and management.   Patient was referred and last seen by Primary Care Provider, Dr. Vicci, on 06/02/2023. At that visit, BP was at goal of < 130/80 with a BP of 128/73. However, pt was reporting significant LE edema, so amlodipine  was decreased to 5 mg daily and she was instructed to use furosemide  as needed.    Pharmacy saw her subsequently on 07/05/2023. BP was 129/82 mmHg at that visit. LE swelling had improved. She reported at that visit that the edema was more of an occurrence when standing for long periods of time at work. No edema at rest.   PMH is significant for HTN, allergies, schizoaffective disorder, HLD, vitamin D  deficiency, IDA, preDM.   Today, patient arrives in good spirits and presents without assistance. Denies dizziness, headache, blurred vision, swelling. No increased swelling since last visit. Was seen by Podiatry 07/15/2023 and denies any new pain since seeing Dr. Gershon. Of note, she has a longstanding hx of chronic dental pain. She requests a dental referral today.   Family history: none pertinent Social history: never smoker, no current alcohol use   Medication adherence reported. She brings her medications with her for review. She is taking the amlodipine , valsartan -hydrochlorothiazide , and furosemide . Of note she only takes furosemide  prn for swelling. Takes her valsartan -hydrochlorothiazide  in the morning and amlodipine  in the afternoon. She is not taking carvedilol .   Current antihypertensives include: amlodipine  5 mg daily, carvedilol  12.5 mg BID, furosemide  20 mg PRN, valsartan -hydrochlorothiazide  320-25 mg daily   Reported home BP readings: doesn't check  O:  Vitals:   08/16/23 0926  BP: 138/81  Pulse: 70    Vitals:   08/16/23 0926  BP: 138/81  Pulse: 70   Last 3 Office BP readings: BP Readings from Last 3  Encounters:  08/16/23 138/81  07/05/23 129/82  06/24/23 (!) 151/83    BMET    Component Value Date/Time   NA 140 06/02/2023 1044   K 3.7 06/02/2023 1044   CL 102 06/02/2023 1044   CO2 21 06/02/2023 1044   GLUCOSE 75 06/02/2023 1044   GLUCOSE 88 09/18/2022 0621   BUN 6 06/02/2023 1044   CREATININE 0.59 06/02/2023 1044   CREATININE 0.66 02/23/2016 0924   CALCIUM  9.0 06/02/2023 1044   GFRNONAA >60 01/01/2021 1805   GFRNONAA >89 02/23/2016 0924   GFRAA 107 06/20/2019 1102   GFRAA >89 02/23/2016 0924    Renal function: CrCl cannot be calculated (Patient's most recent lab result is older than the maximum 21 days allowed.).  Clinical ASCVD: No  The 10-year ASCVD risk score (Arnett DK, et al., 2019) is: 4.9%   Values used to calculate the score:     Age: 22 years     Clincally relevant sex: Female     Is Non-Hispanic African American: Yes     Diabetic: No     Tobacco smoker: No     Systolic Blood Pressure: 138 mmHg     Is BP treated: Yes     HDL Cholesterol: 49 mg/dL     Total Cholesterol: 133 mg/dL  A/P: Hypertension diagnosed currently close to goal on current medications. BP goal < 130/80 mmHg. Medication adherence appears suboptimal. I have sent refills for carvedilol  and she is amenable to refilling this today. She has only taken valsartan -hydrochlorothiazide  this morning and her BP today is okay. Hence, we will  hold off on any changes at this time.  -Continue amlodipine  5 mg daily -Continue carvedilol  12.5 mg BID, furosemide  PRN, and valsartan -hydrochlorothiazide  320-25 mg daily -Patient educated on purpose, proper use, and potential adverse effects of amlodipine , carvedilol , furosemide , valsartan -hydrochlorothiazide .  -Refills for medications sent.  -F/u labs ordered - none ordered -Counseled on lifestyle modifications for blood pressure control including reduced dietary sodium, increased exercise, adequate sleep. -Encouraged patient to check BP at home and bring log  of readings to next visit. Counseled on proper use of home BP cuff.   Results reviewed and written information provided.    Written patient instructions provided. Patient verbalized understanding of treatment plan.  Total time in face to face counseling 30 minutes.    Follow-up:  Pharmacist: prn PCP clinic visit 10/03/2023  Herlene Fleeta Morris, PharmD, BCACP, CPP Clinical Pharmacist [facility] [phone]

## 2023-09-03 ENCOUNTER — Other Ambulatory Visit: Payer: Self-pay | Admitting: Podiatry

## 2023-09-07 ENCOUNTER — Telehealth: Payer: Self-pay | Admitting: Internal Medicine

## 2023-09-07 ENCOUNTER — Telehealth: Payer: Self-pay | Admitting: Podiatry

## 2023-09-07 DIAGNOSIS — E7800 Pure hypercholesterolemia, unspecified: Secondary | ICD-10-CM

## 2023-09-07 MED ORDER — ATORVASTATIN CALCIUM 20 MG PO TABS: 20.0000 mg | ORAL_TABLET | Freq: Every day | ORAL | 2 refills | Status: AC

## 2023-09-07 NOTE — Telephone Encounter (Signed)
 Called patient using a Language Line interpreter, Salome [phone] , and LVM informing that the requested refill has been sent to the pharmacy.

## 2023-09-07 NOTE — Addendum Note (Signed)
 Addended by: VICCI SOBER B on: 09/07/2023 03:31 PM   Modules accepted: Orders

## 2023-09-07 NOTE — Telephone Encounter (Signed)
 Patient is requesting refills on atorvastatin  (LIPITOR) 20 MG tablet

## 2023-09-07 NOTE — Telephone Encounter (Signed)
 Patient came in stating she has been to [facility] 2x and they have told her they don't have this prescription for her there.  meloxicam  (MOBIC ) 15 MG tablet Patient is requesting a call back regarding this issue.

## 2023-09-07 NOTE — Telephone Encounter (Signed)
Refill sent on Atorvastatin.

## 2023-09-09 NOTE — Telephone Encounter (Signed)
 Called patient no answer.

## 2023-09-23 ENCOUNTER — Ambulatory Visit (HOSPITAL_COMMUNITY): Payer: MEDICAID | Admitting: Physician Assistant

## 2023-09-23 VITALS — BP 164/87 | HR 100 | Ht <= 58 in | Wt 145.4 lb

## 2023-09-23 DIAGNOSIS — Z79899 Other long term (current) drug therapy: Secondary | ICD-10-CM | POA: Diagnosis not present

## 2023-09-23 DIAGNOSIS — F259 Schizoaffective disorder, unspecified: Secondary | ICD-10-CM

## 2023-09-23 MED ORDER — HALOPERIDOL 1 MG PO TABS: 1.0000 mg | ORAL_TABLET | Freq: Every day | ORAL | 3 refills | Status: DC

## 2023-09-23 NOTE — Progress Notes (Signed)
 [facility] MD/PA/NP OP Progress Note  09/23/2023 10:00 AM Cindy Garrison  MRN:  [id number]  Chief Complaint:  Chief Complaint  Patient presents with   Follow-up   Medication Refill   HPI:   Cindy Garrison is a 53 year old, African-American female with a past psychiatric history significant for schizoaffective disorder (unspecified type) who presents to [facility] for follow-up and medication management.  Language interpreter services were utilized during the duration of the encounter.  Patient is currently being managed on the following medication: Haldol  1 mg at bedtime.  Patient presents to the encounter stating that she continues to take her Haldol  as prescribed.  Patient denies experiencing any adverse side effects associated with the use of her Haldol .  Patient denies overt depressive symptoms nor does she endorse anxiety.  Patient denies paranoia or changes in her behavior.  She further denies auditory or visual hallucinations.  Patient denies any new stressors at this time.  A PHQ-9 screening was performed with the patient scoring a 0.  A GAD-7 screen was also performed with the patient scoring a 0.  Patient is alert and oriented x 4, calm, cooperative, and fully engaged in conversation during the encounter.  Patient endorses good mood.  Patient exhibits euthymic mood with appropriate affect.  Patient denies suicidal or homicidal ideations.  She further denies auditory or visual hallucinations and does not appear to be responding to internal/external stimuli.  Patient endorses fair sleep and receives on average 4 to 5 hours of sleep per night.  Patient endorses good appetite and eats on average 3 meals per day.  Patient denies alcohol consumption, tobacco use, or illicit drug use.  Visit Diagnosis:    ICD-10-CM   1. Schizoaffective disorder, unspecified type (HCC)  F25.9 haloperidol  (HALDOL ) 1 MG tablet    2. Long term current use of antipsychotic  medication  Z79.899 Lipid panel    CBC with Differential/Platelet    Hemoglobin A1c    Comprehensive metabolic panel with GFR    Ambulatory referral to Cardiology    Comprehensive metabolic panel with GFR    Hemoglobin A1c    CBC with Differential/Platelet    Lipid panel      Past Psychiatric History:  Schizoaffective disorder.  Patient has been on Haldol  for 10 years.  Prior to establishing care with [facility], patient was being seen at [facility]   Past Medical History:  Past Medical History:  Diagnosis Date   #[id number]    Acne    Back pain    Essential hypertension    Seasonal allergies    Syncope 09/16/2014   Syncope and collapse 09/16/2014    Past Surgical History:  Procedure Laterality Date   DILITATION & CURRETTAGE/HYSTROSCOPY WITH NOVASURE ABLATION N/A 12/22/2017   Procedure: DILATATION & CURETTAGE/ HYSTEROSCOPY WITH ENDOMETRIAL FIBROID AND  NOVASURE ABLATION;  Surgeon: Corene Coy, MD;  Location: [facility];  Service: Gynecology;  Laterality: N/A;    Family Psychiatric History:  Patient denies  Family History:  Family History  Problem Relation Age of Onset   Breast cancer Sister     Social History:  Social History   Socioeconomic History   Marital status: Married    Spouse name: Not on file   Number of children: Not on file   Years of education: Not on file   Highest education level: Not on file  Occupational History   Not on file  Tobacco Use   Smoking status: Never  Smokeless tobacco: Never  Vaping Use   Vaping status: Never Used  Substance and Sexual Activity   Alcohol use: Not Currently    Comment: occ.   Drug use: No   Sexual activity: Not on file    Comment: monogamous female partner for 5 years (2010)  Other Topics Concern   Not on file  Social History Narrative   Not on file   Social Drivers of Health   Financial Resource Strain: High Risk (05/28/2021)   Overall Financial Resource Strain (CARDIA)     Difficulty of Paying Living Expenses: Very hard  Food Insecurity: Food Insecurity Present (05/28/2021)   Hunger Vital Sign    Worried About Running Out of Food in the Last Year: Often true    Ran Out of Food in the Last Year: Often true  Transportation Needs: No Transportation Needs (03/20/2021)   PRAPARE - Administrator, Civil Service (Medical): No    Lack of Transportation (Non-Medical): No  Physical Activity: Not on file  Stress: Not on file  Social Connections: Not on file    Allergies:  Allergies  Allergen Reactions   Other Rash    Blood pressure pill (unknown name)    Metabolic Disorder Labs: Lab Results  Component Value Date   HGBA1C 5.8 (H) 02/15/2023   No results found for: PROLACTIN Lab Results  Component Value Date   CHOL 133 12/02/2022   TRIG 69 12/02/2022   HDL 49 12/02/2022   CHOLHDL 2.7 12/02/2022   LDLCALC 70 12/02/2022   LDLCALC 80 05/28/2021   Lab Results  Component Value Date   TSH 1.480 11/11/2021   TSH 1.260 06/20/2019    Therapeutic Level Labs: No results found for: LITHIUM No results found for: VALPROATE No results found for: CBMZ  Current Medications: Current Outpatient Medications  Medication Sig Dispense Refill   acetaminophen  (TYLENOL ) 500 MG tablet Take 500 mg by mouth every 6 (six) hours as needed for mild pain or fever. (Patient not taking: Reported on 06/02/2023)     amLODipine  (NORVASC ) 5 MG tablet Take 1 tablet (5 mg total) by mouth daily. Dose decrease 90 tablet 1   atorvastatin  (LIPITOR) 20 MG tablet Take 1 tablet (20 mg total) by mouth daily. 90 tablet 2   carvedilol  (COREG ) 12.5 MG tablet Take 1 tablet (12.5 mg total) by mouth 2 (two) times daily with a meal. 60 tablet 3   furosemide  (LASIX ) 20 MG tablet Take 1 daily as needed for swelling 30 tablet 3   haloperidol  (HALDOL ) 1 MG tablet Take 1 tablet (1 mg total) by mouth at bedtime. 30 tablet 3   levocetirizine (XYZAL ) 5 MG tablet Take 1 tablet (5 mg total)  by mouth every evening. 90 tablet 1   meloxicam  (MOBIC ) 15 MG tablet TAKE 1 TABLET (15 MG TOTAL) BY MOUTH DAILY. 30 tablet 3   norethindrone  (AYGESTIN ) 5 MG tablet Take 1 tablet (5 mg total) by mouth daily. 90 tablet 1   valsartan -hydrochlorothiazide  (DIOVAN -HCT) 320-25 MG tablet Take 1 tablet by mouth daily. 90 tablet 3   Vitamin D , Ergocalciferol , (DRISDOL ) 1.25 MG (50000 UNIT) CAPS capsule Take 1 capsule (50,000 Units total) by mouth every 7 (seven) days on Wednesday 12 capsule 4   No current facility-administered medications for this visit.     Musculoskeletal: Strength & Muscle Tone: within normal limits Gait & Station: normal Patient leans: N/A  Psychiatric Specialty Exam: Review of Systems  Psychiatric/Behavioral:  Positive for sleep disturbance. Negative for decreased  concentration, dysphoric mood, hallucinations, self-injury and suicidal ideas. The patient is not nervous/anxious and is not hyperactive.     Blood pressure (!) 164/87, pulse 100, height 4' 9 (1.448 m), weight 145 lb 6.4 oz (66 kg), last menstrual period 09/12/2017, SpO2 99%.Body mass index is 31.46 kg/m.  General Appearance: Casual  Eye Contact:  Fair  Speech:  Clear and Coherent and Normal Rate  Volume:  Normal  Mood:  Euthymic  Affect:  Appropriate  Thought Process:  Coherent, Goal Directed, and Descriptions of Associations: Intact  Orientation:  Full (Time, Place, and Person)  Thought Content: WDL   Suicidal Thoughts:  No  Homicidal Thoughts:  No  Memory:  Immediate;   Good Recent;   Good Remote;   Good  Judgement:  Good  Insight:  Good  Psychomotor Activity:  Normal  Concentration:  Concentration: Good and Attention Span: Good  Recall:  Good  Fund of Knowledge: Good  Language: Good  Akathisia:  No  Handed:  Right  AIMS (if indicated): not done  Assets:  Communication Skills Desire for Improvement Financial Resources/Insurance Housing Intimacy Social Support  ADL's:  Intact  Cognition:  WNL  Sleep:  Fair   Screenings: AIMS    Flowsheet Row Clinical Support from 09/23/2023 in [facility] Clinical Support from 06/24/2023 in [facility] Clinical Support from 01/22/2022 in [facility] Office Visit from 04/01/2020 in [facility]  AIMS Total Score 0 0 2 0   GAD-7    Flowsheet Row Clinical Support from 09/23/2023 in [facility] Clinical Support from 06/24/2023 in [facility] Clinical Support from 03/25/2023 in [facility] Clinical Support from 12/24/2022 in [facility] Clinical Support from 10/15/2022 in [facility]  Total GAD-7 Score 0 0 2 0 0   PHQ2-9    Flowsheet Row Clinical Support from 09/23/2023 in [facility] Clinical Support from 06/24/2023 in [facility] Clinical Support from 03/25/2023 in [facility] Clinical Support from 12/24/2022 in [facility] Clinical Support from 10/15/2022 in [facility]  PHQ-2 Total Score 0 0 0 0 0   Flowsheet Row Clinical Support from 09/23/2023 in [facility] Clinical Support from 06/24/2023 in [facility] Clinical Support from 03/25/2023 in [facility]  C-SSRS RISK CATEGORY No Risk No Risk No Risk     Assessment and Plan:   Cindy Garrison is a 53 year old, African-American female with a past psychiatric history significant for schizoaffective disorder (unspecified type) who presents to [facility] for follow-up and medication management.  Patient presents to the encounter stating that she continues to take her Haldol  as prescribed and denies  experiencing any adverse side effects.  Patient denies overt depressive symptoms nor does she endorse anxiety.  A PHQ-9 screen was performed with the patient scoring a 0.  A GAD-7 screen was also performed with the patient scoring a 0.  Patient denies changes in her behavior nor does she endorse paranoia.  Patient does not appear to be responding to internal/external stimuli.  Patient endorses stability on her current medication regimen and would like to continue taking her Haldol  as prescribed.  Patient's medication to be e-prescribed to pharmacy of choice.  A [place] Suicide Severity Rating Scale  was performed with the patient being considered no risk.  Patient denies suicidal ideations and is able to contract for safety at this time.  Collaboration of Care: Collaboration of Care: Medication Management AEB provider managing patient's psychiatric medications, Primary Care Provider AEB patient being seen by a primary care provider, and Psychiatrist AEB patient being followed by mental health provider at this facility  Patient/Guardian was advised Release of Information must be obtained prior to any record release in order to collaborate their care with an outside provider. Patient/Guardian was advised if they have not already done so to contact the registration department to sign all necessary forms in order for us  to release information regarding their care.   Consent: Patient/Guardian gives verbal consent for treatment and assignment of benefits for services provided during this visit. Patient/Guardian expressed understanding and agreed to proceed.   1. Schizoaffective disorder, unspecified type (HCC) (Primary)  - haloperidol  (HALDOL ) 1 MG tablet; Take 1 tablet (1 mg total) by mouth at bedtime.  Dispense: 30 tablet; Refill: 3  2. Long term current use of antipsychotic medication  - Lipid panel; Future - CBC with Differential/Platelet; Future - Hemoglobin A1c; Future - Comprehensive  metabolic panel with GFR; Future - Ambulatory referral to Cardiology  Patient to follow-up in 3 months Provider spent a total of 30 minutes with the patient/reviewing patient's chart  Reginia FORBES Bolster, PA 09/23/2023 10:00 AM

## 2023-09-24 ENCOUNTER — Encounter (HOSPITAL_COMMUNITY): Payer: Self-pay | Admitting: Physician Assistant

## 2023-09-25 ENCOUNTER — Other Ambulatory Visit: Payer: Self-pay | Admitting: Critical Care Medicine

## 2023-09-25 DIAGNOSIS — J301 Allergic rhinitis due to pollen: Secondary | ICD-10-CM

## 2023-09-25 DIAGNOSIS — L853 Xerosis cutis: Secondary | ICD-10-CM

## 2023-10-03 ENCOUNTER — Ambulatory Visit

## 2023-10-03 ENCOUNTER — Encounter: Payer: Self-pay | Admitting: Internal Medicine

## 2023-10-03 ENCOUNTER — Ambulatory Visit: Payer: Self-pay | Attending: Internal Medicine | Admitting: Internal Medicine

## 2023-10-03 VITALS — BP 164/82 | HR 82 | Ht <= 58 in | Wt 151.0 lb

## 2023-10-03 DIAGNOSIS — K0889 Other specified disorders of teeth and supporting structures: Secondary | ICD-10-CM

## 2023-10-03 DIAGNOSIS — J301 Allergic rhinitis due to pollen: Secondary | ICD-10-CM

## 2023-10-03 DIAGNOSIS — Z1231 Encounter for screening mammogram for malignant neoplasm of breast: Secondary | ICD-10-CM

## 2023-10-03 DIAGNOSIS — I10 Essential (primary) hypertension: Secondary | ICD-10-CM

## 2023-10-03 DIAGNOSIS — F259 Schizoaffective disorder, unspecified: Secondary | ICD-10-CM

## 2023-10-03 DIAGNOSIS — R7303 Prediabetes: Secondary | ICD-10-CM | POA: Diagnosis not present

## 2023-10-03 DIAGNOSIS — E559 Vitamin D deficiency, unspecified: Secondary | ICD-10-CM

## 2023-10-03 DIAGNOSIS — Z79899 Other long term (current) drug therapy: Secondary | ICD-10-CM

## 2023-10-03 DIAGNOSIS — Z780 Asymptomatic menopausal state: Secondary | ICD-10-CM

## 2023-10-03 DIAGNOSIS — Z2821 Immunization not carried out because of patient refusal: Secondary | ICD-10-CM

## 2023-10-03 DIAGNOSIS — Z86018 Personal history of other benign neoplasm: Secondary | ICD-10-CM | POA: Diagnosis not present

## 2023-10-03 DIAGNOSIS — E7800 Pure hypercholesterolemia, unspecified: Secondary | ICD-10-CM

## 2023-10-03 DIAGNOSIS — N857 Hematometra: Secondary | ICD-10-CM

## 2023-10-03 MED ORDER — LORATADINE 10 MG PO TABS: 10.0000 mg | ORAL_TABLET | Freq: Every day | ORAL | 1 refills | Status: AC | PRN

## 2023-10-03 MED ORDER — VITAMIN D (ERGOCALCIFEROL) 1.25 MG (50000 UNIT) PO CAPS: 50000.0000 [IU] | ORAL_CAPSULE | ORAL | 4 refills | Status: AC

## 2023-10-03 MED ORDER — AMLODIPINE BESYLATE 5 MG PO TABS: 5.0000 mg | ORAL_TABLET | Freq: Every day | ORAL | 1 refills | Status: DC

## 2023-10-03 MED ORDER — NORETHINDRONE ACETATE 5 MG PO TABS: 5.0000 mg | ORAL_TABLET | Freq: Every day | ORAL | 1 refills | Status: DC

## 2023-10-03 NOTE — Progress Notes (Signed)
 Patient ID: Cindy Garrison, female    DOB: [birth year]  MRN: [id number]  CC: Hypertension (HTN f/u. /On-going tooth pain - unable to eat. Unable to assist dentist due to being OON/)   Subjective: Cindy Garrison is a 53 y.o. female who presents for chronic ds management. Her concerns today include: Pt with hx of  HTN, allergies, schizoaffective disorder, HL, Vit D def, IDA d/t uterine fibroid, preDM, Plantar fasciitis  [employer] Language interpreter used during this encounter. #[id number] Chumbeh Therese   Tooth pain: Sharp pain at L most upper posterior tooth for the last 4 months when she eats any type of food. Denies radiating pain to jaw or jaw swelling. Rates pain 6/10. Tylenol  does not help. Wants help finding dentist in network.   Seasonal allergies: Reports her Xyzal  makes her sleepy and wants another allergy medication.  Prediabetes:  02/15/2023 A1c: 5.8 Gained 11 pounds since 06/2023. 151 today. Diet: Eats popeyes, subway, [employer] often because she does not have time to cook at home. More sleepy and has not been able to cook for the last few months. Thinks she can cut back and start cooking at least once a week. She is not exercising.  HTN:  BP 138/77 initial; 164/82 on recheck Currently on amlodipine  5 mg daily, carvedilol  12.5 mg twice a day, Diovan /HCTZ 320/25 mg twice a day. Furosemide  PRN- last took 1-2 months ago. The patient took one BP pill this morning, unsure of the name. Took the rest at night. Reports good compliance with all BP meds except for carvedilol . She has not taken carvedilol  since she thought it was expired and she needed to take it with meat and cannot buy meat. She misread the label that said take with meals. Does not check BP. Declines BP cuff. The patient is not limiting salt in diet.  Eats a lot of fast food as above. Reports 4-5 hours of sleep at night due to work schedule. Denies SOB, CP, leg swelling, HA, dizziness.  HL:  Last Lipid Panel results: Lipid  Panel     Component Value Date/Time   CHOL 133 12/02/2022 1038   TRIG 69 12/02/2022 1038   HDL 49 12/02/2022 1038   CHOLHDL 2.7 12/02/2022 1038   LDLCALC 70 12/02/2022 1038   LABVLDL 14 12/02/2022 1038   The patient is still on atorvastatin  20 mg daily for cholesterol management and reports good adherence.    Hematometra: Continues norethindrone . LMP 3 years ago. Has not seen gynecology since then.     Vit D deficiency: Continues vitamin D  daily   Schizoaffective disorder: Saw [facility] provider on 09/23/2023. Continues Haldol  1 mg- and states it is helpful.  HM: Declined Hepatitis B Vaccines  Mammogram due on 11/28/2022, had previous referral sent but pt has not scheduled it claiming she did not get the address- she does not get day calls due to working at night. Wants this scheduled for her.  Declined Influenza Vaccine   Patient Active Problem List   Diagnosis Date Noted   Foot callus 12/02/2022   Chronic dental pain 08/04/2021   Acne vulgaris 05/28/2021   At moderate risk for fall 05/28/2021   Food insecurity 05/28/2021   Financial insecurity 05/28/2021   Language barrier affecting health care, [place] is not well understood 05/28/2021   Hematometra 03/20/2021   Ovarian mass 11/10/2020   Cervical os stenosis 11/10/2020   History of endometrial ablation 04/09/2020   Screening mammogram for breast cancer 04/09/2020   Cervical cancer screening 01/23/2020  Pure hypercholesterolemia 01/23/2020   Colon cancer screening 01/23/2020   Vitamin D  deficiency 01/23/2020   Fibroid uterus 03/26/2015   Schizoaffective disorder (HCC) 09/16/2014   Essential hypertension    Allergic rhinitis 09/13/2006     Current Outpatient Medications on File Prior to Visit  Medication Sig Dispense Refill   acetaminophen  (TYLENOL ) 500 MG tablet Take 500 mg by mouth every 6 (six) hours as needed for mild pain or fever. (Patient not taking: Reported on 06/02/2023)     atorvastatin  (LIPITOR) 20 MG tablet Take  1 tablet (20 mg total) by mouth daily. 90 tablet 2   carvedilol  (COREG ) 12.5 MG tablet Take 1 tablet (12.5 mg total) by mouth 2 (two) times daily with a meal. 60 tablet 3   furosemide  (LASIX ) 20 MG tablet Take 1 daily as needed for swelling 30 tablet 3   haloperidol  (HALDOL ) 1 MG tablet Take 1 tablet (1 mg total) by mouth at bedtime. 30 tablet 3   meloxicam  (MOBIC ) 15 MG tablet TAKE 1 TABLET (15 MG TOTAL) BY MOUTH DAILY. 30 tablet 3   valsartan -hydrochlorothiazide  (DIOVAN -HCT) 320-25 MG tablet Take 1 tablet by mouth daily. 90 tablet 3   [DISCONTINUED] hydrochlorothiazide  (HYDRODIURIL ) 25 MG tablet Take 1 tablet (25 mg total) by mouth daily. 30 tablet 2   [DISCONTINUED] lisinopril  (ZESTRIL ) 40 MG tablet TAKE 1 TABLET (40 MG TOTAL) BY MOUTH DAILY. TO LOWER BLOOD PRESSURE 30 tablet 2   [DISCONTINUED] omeprazole  (PRILOSEC) 40 MG capsule Take 1 capsule (40 mg total) by mouth 2 (two) times daily before a meal. 60 capsule 2   No current facility-administered medications on file prior to visit.    Allergies  Allergen Reactions   Other Rash    Blood pressure pill (unknown name)    Social History   Socioeconomic History   Marital status: Married    Spouse name: Not on file   Number of children: Not on file   Years of education: Not on file   Highest education level: Not on file  Occupational History   Not on file  Tobacco Use   Smoking status: Never   Smokeless tobacco: Never  Vaping Use   Vaping status: Never Used  Substance and Sexual Activity   Alcohol use: Not Currently    Comment: occ.   Drug use: No   Sexual activity: Not on file    Comment: monogamous female partner for 5 years (2010)  Other Topics Concern   Not on file  Social History Narrative   Not on file   Social Drivers of Health   Financial Resource Strain: High Risk (05/28/2021)   Overall Financial Resource Strain (CARDIA)    Difficulty of Paying Living Expenses: Very hard  Food Insecurity: Food Insecurity Present  (05/28/2021)   Hunger Vital Sign    Worried About Running Out of Food in the Last Year: Often true    Ran Out of Food in the Last Year: Often true  Transportation Needs: No Transportation Needs (03/20/2021)   PRAPARE - Administrator, Civil Service (Medical): No    Lack of Transportation (Non-Medical): No  Physical Activity: Not on file  Stress: Not on file  Social Connections: Not on file  Intimate Partner Violence: Not on file    Family History  Problem Relation Age of Onset   Breast cancer Sister     Past Surgical History:  Procedure Laterality Date   DILITATION & CURRETTAGE/HYSTROSCOPY WITH NOVASURE ABLATION N/A 12/22/2017   Procedure: DILATATION &  CURETTAGE/ HYSTEROSCOPY WITH ENDOMETRIAL FIBROID AND  NOVASURE ABLATION;  Surgeon: Corene Coy, MD;  Location: [facility];  Service: Gynecology;  Laterality: N/A;    ROS: Review of Systems Negative except as stated above  PHYSICAL EXAM: BP (!) 164/82   Pulse 82   Ht 4' 9 (1.448 m)   Wt 151 lb (68.5 kg)   LMP 09/12/2017 (LMP Unknown)   SpO2 100%   BMI 32.68 kg/m   Physical Exam  General appearance - alert, well appearing, and in no distress Mouth- No obvious dental cavities seen, no signs of dental abscess. No facial swelling noted  Mental status - alert, oriented to person, place, and time, behavior is agitated  Neck - consistently bounding carotid pulse on R side to sternoclavicular notch Chest - clear to auscultation, no wheezes, rales or rhonchi, symmetric air entry Heart - normal rate, regular rhythm, normal S1, S2, no murmurs, rubs, clicks or gallops, no JVD Extremities - 1+ pitting edema b/l LE above socks line      Latest Ref Rng & Units 06/02/2023   10:44 AM 12/02/2022   10:38 AM 09/18/2022    6:21 AM  CMP  Glucose 70 - 99 mg/dL 75  82  88   BUN 6 - 24 mg/dL 6  17  4    Creatinine 0.57 - 1.00 mg/dL 9.40  9.39  9.49   Sodium 134 - 144 mmol/L 140  141  139   Potassium 3.5 - 5.2 mmol/L 3.7   3.8  3.5   Chloride 96 - 106 mmol/L 102  103  103   CO2 20 - 29 mmol/L 21  19    Calcium  8.7 - 10.2 mg/dL 9.0  9.0    Total Protein 6.0 - 8.5 g/dL  7.4    Total Bilirubin 0.0 - 1.2 mg/dL  0.3    Alkaline Phos 44 - 121 IU/L  48    AST 0 - 40 IU/L  30    ALT 0 - 32 IU/L  28     Lipid Panel     Component Value Date/Time   CHOL 133 12/02/2022 1038   TRIG 69 12/02/2022 1038   HDL 49 12/02/2022 1038   CHOLHDL 2.7 12/02/2022 1038   LDLCALC 70 12/02/2022 1038    CBC    Component Value Date/Time   WBC 7.1 02/15/2023 0809   WBC 7.0 01/01/2021 1805   RBC 5.23 02/15/2023 0809   RBC 5.08 01/01/2021 1805   HGB 14.4 02/15/2023 0809   HCT 42.2 02/15/2023 0809   PLT 238 02/15/2023 0809   MCV 81 02/15/2023 0809   MCH 27.5 02/15/2023 0809   MCH 25.8 (L) 01/01/2021 1805   MCHC 34.1 02/15/2023 0809   MCHC 34.3 01/01/2021 1805   RDW 12.8 02/15/2023 0809   LYMPHSABS 2.6 02/15/2023 0809   MONOABS 0.8 01/01/2021 1805   EOSABS 0.1 02/15/2023 0809   BASOSABS 0.0 02/15/2023 0809    ASSESSMENT AND PLAN:  Assessment and Plan  1. Tooth pain with chewing (Primary) No obvious signs of dental cavity on exam. Gave pt medicaid list of dentists and also looked at her insurance card number and walked her how to call and what to say to find out a dentist in network that can see her for tooth pain.   2. Essential hypertension Not at goal. Pt was not taking her carvedilol  as prescribed due to misunderstanding of how to take it. Re-explained that she can take it with meals twice a day-  with breakfast and with dinner. Continue amlodipine  5 mg daily, carvedilol  12.5 mg twice a day, Diovan /HCTZ 320/25 mg twice a day. Continue Furosemide  20 mg PRN for leg swelling.  - Discussed lifestyle modifications such as limiting fast food that contains a lot of salt and better sleep hygiene  - amLODipine  (NORVASC ) 5 MG tablet; Take 1 tablet (5 mg total) by mouth daily. Dose decrease  Dispense: 90 tablet; Refill: 1  3.  Prediabetes Recently gained weight and has been eating fast food more often. Dietary counseling given. - Hemoglobin A1c  4. Postmenopausal 5. History of uterine fibroid LMP being 3 years ago means patient likely does not need norethindrone  which was prescribed for bleeding due to fibroids. Would like her to be seen by OBGYN again and reassess. - Ambulatory referral to gynecology.  - FSH/LH  6. Hematometra #4 and 5 as above - norethindrone  (AYGESTIN ) 5 MG tablet; Take 1 tablet (5 mg total) by mouth daily.  Dispense: 90 tablet; Refill: 1  7. Allergic rhinitis due to pollen, unspecified seasonality Started her on Claritin  to take daily since Xyzal  was sedating. - loratadine  (CLARITIN ) 10 MG tablet; Take 1 tablet (10 mg total) by mouth daily as needed for allergies.  Dispense: 60 tablet; Refill: 1  8. Pure hypercholesterolemia Continue atorvastatin  - Hepatic function panel - Lipid Panel  9. Vitamin D  deficiency - Vitamin D , Ergocalciferol , (DRISDOL ) 1.25 MG (50000 UNIT) CAPS capsule; Take 1 capsule (50,000 Units total) by mouth every 7 (seven) days on Wednesday  Dispense: 12 capsule; Refill: 4  10. Influenza vaccination declined  11. Hepatitis B vaccination declined  12. Schizoaffective disorder, unspecified type (HCC) Controlled. Continue Haldol  as prescribed by [facility].  - Continue to follow with [facility].  13. Screening for mammogram for breast cancer: Called and scheduled patient's mammogram for her, it is at 2:50 pm later today.  Patient was given the opportunity to ask questions.  Patient verbalized understanding of the plan and was able to repeat key elements of the plan.   Orders Placed This Encounter  Procedures   Hemoglobin A1c   Hepatic function panel   Lipid Panel   FSH/LH     Requested Prescriptions   Signed Prescriptions Disp Refills   amLODipine  (NORVASC ) 5 MG tablet 90 tablet 1    Sig: Take 1 tablet (5 mg total) by mouth daily. Dose decrease   Vitamin D ,  Ergocalciferol , (DRISDOL ) 1.25 MG (50000 UNIT) CAPS capsule 12 capsule 4    Sig: Take 1 capsule (50,000 Units total) by mouth every 7 (seven) days on Wednesday   norethindrone  (AYGESTIN ) 5 MG tablet 90 tablet 1    Sig: Take 1 tablet (5 mg total) by mouth daily.   loratadine  (CLARITIN ) 10 MG tablet 60 tablet 1    Sig: Take 1 tablet (10 mg total) by mouth daily as needed for allergies.    No follow-ups on file.  This is a Psychologist, occupational Note.  The care of the patient was discussed with Dr. Vicci and the assessment and plan formulated with her assistance.  Please see her attestation of this encounter.  Duwaine Amber, MS3 [employer]  Evaluation and management procedures were performed by me with Medical Student in attendance, note written by Medical Student under my supervision and collaboration. I have reviewed the note and I agree with the management and plan. Barnie Vicci, MD, FAAFP. [facility] [place], [place] [phone]   10/03/2023, 1:48 PM

## 2023-10-03 NOTE — Patient Instructions (Addendum)
 We have changed your Xyzal  to Claritin . You can take Claritin  once a day.   Your blood pressure is still high. Make sure you are taking all your blood pressure medications: amlodipine  5 mg daily, carvedilol  12.5 mg twice a day, and Diovan /HCTZ 320/25 mg twice a day. Try to cut back on fast foods and salty foods and cook at home more while limiting salt. Try to get more sleep as well, this can also improve blood pressure.  Take carvedilol  12.5mg  twice a day, with breakfast and dinner. Every day.  The phone number to the breast center is: [phone]. We have scheduled your mammogram for breast cancer screening today 2:50PM  We have given you a list of dentists to ask your insurance if they are in network. Please reach back out to them.  ----  Nous avons remplac votre Xyzal  par du Claritin . Vous pouvez prendre du Claritin  [employer] par [employer].  Votre tension artrielle est toujours leve. Assurez-vous de prendre tous vos mdicaments contre l'hypertension : amlodipine  5 mg par jour, carvdilol 12,5 mg deux fois par jour et Diovan /HCTZ 320/25 mg deux fois par [employer]. Jeffery de rduire votre consommation de fast-food et d'aliments sals et de cuisiner davantage  la maison tout en limitant votre consommation de sel. Dormez galement davantage : cela peut galement amliorer votre tension artrielle.  Prenez du carvdilol 12,5 mg deux fois par jour, au petit-djeuner et au dner. Tous les jours.  Le numro de tlphone du centre du sein est le : [phone]. Nous avons programm votre mammographie de dpistage du cancer du sein aujourd'hui 2:50PM  Nous vous avons fourni une liste de Dispensing optician auprs de Tourist information centre manager pour savoir s'ils font partie de votre rseau. [employer] Financial planner.

## 2023-10-04 ENCOUNTER — Ambulatory Visit: Payer: Self-pay | Admitting: Internal Medicine

## 2023-10-04 LAB — LIPID PANEL
Chol/HDL Ratio: 2.8 ratio (ref 0.0–4.4)
Cholesterol, Total: 197 mg/dL (ref 100–199)
HDL: 70 mg/dL (ref >39)
LDL Chol Calc (NIH): 114 mg/dL — ABNORMAL HIGH (ref 0–99)
Triglycerides: 70 mg/dL (ref 0–149)
VLDL Cholesterol Cal: 13 mg/dL (ref 5–40)

## 2023-10-04 LAB — FSH/LH
FSH: 26.1 m[IU]/mL
LH: 17 m[IU]/mL

## 2023-10-04 LAB — HEPATIC FUNCTION PANEL
ALT: 33 IU/L — ABNORMAL HIGH (ref 0–32)
AST: 25 IU/L (ref 0–40)
Albumin: 5.2 g/dL — ABNORMAL HIGH (ref 3.8–4.9)
Alkaline Phosphatase: 63 IU/L (ref 49–135)
Bilirubin Total: 0.4 mg/dL (ref 0.0–1.2)
Bilirubin, Direct: 0.13 mg/dL (ref 0.00–0.40)
Total Protein: 7.6 g/dL (ref 6.0–8.5)

## 2023-10-04 NOTE — Progress Notes (Signed)
 Liver function tests is good. Cholesterol level mildly elevated.  Continue taking the atorvastatin  as prescribed.  Try to decrease consumption of fried foods. Hormone levels look to be in the menopausal or perimenopausal range. We can give a trail of stopping the hormone pill norethindrone .

## 2023-10-06 ENCOUNTER — Telehealth: Payer: Self-pay

## 2023-10-06 NOTE — Telephone Encounter (Signed)
 Copied from CRM [id number]. Topic: Clinical - Lab/Test Results >> Oct 06, 2023  9:40 AM Cindy Garrison wrote: Reason for CRM:  I read her this with an interpreter ID [phone] on the line: Liver function tests is good. Cholesterol level mildly elevated.  Continue taking the atorvastatin  as prescribed.  Try to decrease consumption of fried foods. Hormone levels look to be in the menopausal or perimenopausal range. We can give a trail of stopping the hormone pill norethindrone .  Cindy Garrison would like a nurse to call her to discuss her results so she can better understand. Please call her at [phone]

## 2023-10-19 ENCOUNTER — Ambulatory Visit
Admission: RE | Admit: 2023-10-19 | Discharge: 2023-10-19 | Disposition: A | Discharge status: Home / Self Care | Source: Ambulatory Visit | Attending: Internal Medicine | Admitting: Internal Medicine

## 2023-10-19 DIAGNOSIS — Z1231 Encounter for screening mammogram for malignant neoplasm of breast: Secondary | ICD-10-CM

## 2023-11-04 ENCOUNTER — Ambulatory Visit: Payer: Self-pay

## 2023-11-04 NOTE — Telephone Encounter (Signed)
 FYI Only or Action Required?: FYI only for provider.  Patient was last seen in primary care on 10/03/2023 by Vicci Barnie NOVAK, MD.  Called Nurse Triage reporting Results.  Symptoms began N/A.  Interventions attempted: Other: N/A.  Symptoms are: N/A.  Triage Disposition: Information or Advice Only Call  Patient/caregiver understands and will follow disposition?: Yes  Copied from CRM [id number]. Topic: Clinical - Lab/Test Results >> Nov 04, 2023  9:16 AM Donna BRAVO wrote: Reason for CRM: Samie Garment/textile technologist) [place] ID [phone] Patient is calling regarding lab results Reason for Disposition  Health information question, no triage required and triager able to answer question  Answer Assessment - Initial Assessment Questions Pt calls in to request lab result information. Office had tried to contact pt multiple times over the last month. Lab results relayed by this RN as written by the provider. Pt has no further questions.   1. REASON FOR CALL: What is the main reason for your call? or How can I best help you?     Would like lab result information from last month.  2. SYMPTOMS : Do you have any symptoms?      denies 3. OTHER QUESTIONS: Do you have any other questions?     denies  Protocols used: Information Only Call - No Triage-A-AH

## 2023-12-23 ENCOUNTER — Encounter (HOSPITAL_COMMUNITY): Payer: Self-pay

## 2023-12-23 ENCOUNTER — Telehealth (HOSPITAL_COMMUNITY): Payer: MEDICAID | Admitting: Physician Assistant

## 2023-12-31 ENCOUNTER — Other Ambulatory Visit: Payer: Self-pay | Admitting: Critical Care Medicine

## 2023-12-31 DIAGNOSIS — E559 Vitamin D deficiency, unspecified: Secondary | ICD-10-CM

## 2024-01-18 ENCOUNTER — Encounter (HOSPITAL_COMMUNITY): Payer: Self-pay | Admitting: Physician Assistant

## 2024-01-18 ENCOUNTER — Ambulatory Visit (HOSPITAL_COMMUNITY): Payer: MEDICAID | Admitting: Physician Assistant

## 2024-01-18 VITALS — BP 141/80 | HR 85 | Temp 98.6°F | Ht <= 58 in | Wt 146.8 lb

## 2024-01-18 DIAGNOSIS — F259 Schizoaffective disorder, unspecified: Secondary | ICD-10-CM

## 2024-01-18 DIAGNOSIS — Z79899 Other long term (current) drug therapy: Secondary | ICD-10-CM | POA: Diagnosis not present

## 2024-01-18 MED ORDER — HALOPERIDOL 1 MG PO TABS: 1.0000 mg | ORAL_TABLET | Freq: Every day | ORAL | 3 refills | Status: AC

## 2024-01-18 NOTE — Progress Notes (Cosign Needed)
 [facility] MD/PA/NP OP Progress Note  01/18/2024 10:00 AM BEVIN DAS  MRN:  [id number]  Chief Complaint:  Chief Complaint  Patient presents with   Follow-up   Medication Refill   HPI:   Cindy Garrison is a 53 year old, African-American female with a past psychiatric history significant for schizoaffective disorder (unspecified type) who presents to [facility] for follow-up and medication management.  Language interpreter services were utilized during the duration of the encounter.  Patient is currently being managed on the following medication: Haldol  1 mg at bedtime.  Patient presents to the encounter stating that she has been taking her medications regularly and denies experiencing any adverse side effects.  Patient denies overt depressive symptoms nor does she endorse anxiety.  Patient denies changes in her behavior nor does she endorse paranoia.  She further denies auditory or visual hallucinations.  Patient denies running out of her prescription at this time and denies any other stressors.  A PHQ-9 screen was performed the patient scoring a 9.  A GAD-7 screen was also performed with the patient scoring a 0.  Patient is alert and oriented x 4, calm, cooperative, and fully engaged in conversation during the encounter.  Patient endorses good mood.  Patient exhibits euthymic mood with appropriate affect.  Patient denies suicidal or homicidal ideations.  She further denies auditory or visual hallucinations and does not appear to be responding to internal/external stimuli.  Patient endorses fair sleep and receives on average 4 to 5 hours of sleep per night.  Patient endorses good appetite and eats on average 3 meals per day.  Patient denies alcohol consumption, tobacco use, or illicit drug use.  Visit Diagnosis:    ICD-10-CM   1. Long term current use of antipsychotic medication  Z79.899     2. Schizoaffective disorder, unspecified type (HCC)  F25.9  haloperidol  (HALDOL ) 1 MG tablet      Past Psychiatric History:  Schizoaffective disorder.  Patient has been on Haldol  for 10 years.  Prior to establishing care with [facility], patient was being seen at [facility]   Past Medical History:  Past Medical History:  Diagnosis Date   #[id number]    Acne    Back pain    Essential hypertension    Seasonal allergies    Syncope 09/16/2014   Syncope and collapse 09/16/2014    Past Surgical History:  Procedure Laterality Date   DILITATION & CURRETTAGE/HYSTROSCOPY WITH NOVASURE ABLATION N/A 12/22/2017   Procedure: DILATATION & CURETTAGE/ HYSTEROSCOPY WITH ENDOMETRIAL FIBROID AND  NOVASURE ABLATION;  Surgeon: Corene Coy, MD;  Location: [facility];  Service: Gynecology;  Laterality: N/A;    Family Psychiatric History:  Patient denies  Family History:  Family History  Problem Relation Age of Onset   Breast cancer Sister     Social History:  Social History   Socioeconomic History   Marital status: Married    Spouse name: Not on file   Number of children: Not on file   Years of education: Not on file   Highest education level: Not on file  Occupational History   Not on file  Tobacco Use   Smoking status: Never   Smokeless tobacco: Never  Vaping Use   Vaping status: Never Used  Substance and Sexual Activity   Alcohol use: Not Currently    Comment: occ.   Drug use: No   Sexual activity: Not on file    Comment: monogamous female partner for 5 years (2010)  Other Topics Concern   Not on file  Social History Narrative   Not on file   Social Drivers of Health   Tobacco Use: Low Risk (01/18/2024)   Patient History    Smoking Tobacco Use: Never    Smokeless Tobacco Use: Never    Passive Exposure: Not on file  Financial Resource Strain: High Risk (05/28/2021)   Overall Financial Resource Strain (CARDIA)    Difficulty of Paying Living Expenses: Very hard  Food Insecurity: Food Insecurity Present  (05/28/2021)   Hunger Vital Sign    Worried About Running Out of Food in the Last Year: Often true    Ran Out of Food in the Last Year: Often true  Transportation Needs: No Transportation Needs (03/20/2021)   PRAPARE - Administrator, Civil Service (Medical): No    Lack of Transportation (Non-Medical): No  Physical Activity: Not on file  Stress: Not on file  Social Connections: Not on file  Depression (EYV7-0): Medium Risk (01/18/2024)   Depression (PHQ2-9)    PHQ-2 Score: 9  Alcohol Screen: Not on file  Housing: Not on file  Utilities: Not on file  Health Literacy: Not on file    Allergies:  Allergies  Allergen Reactions   Other Rash    Blood pressure pill (unknown name)    Metabolic Disorder Labs: Lab Results  Component Value Date   HGBA1C 5.8 (H) 02/15/2023   No results found for: PROLACTIN Lab Results  Component Value Date   CHOL 197 10/03/2023   TRIG 70 10/03/2023   HDL 70 10/03/2023   CHOLHDL 2.8 10/03/2023   LDLCALC 114 (H) 10/03/2023   LDLCALC 70 12/02/2022   Lab Results  Component Value Date   TSH 1.480 11/11/2021   TSH 1.260 06/20/2019    Therapeutic Level Labs: No results found for: LITHIUM No results found for: VALPROATE No results found for: CBMZ  Current Medications: Current Outpatient Medications  Medication Sig Dispense Refill   acetaminophen  (TYLENOL ) 500 MG tablet Take 500 mg by mouth every 6 (six) hours as needed for mild pain or fever. (Patient not taking: Reported on 06/02/2023)     amLODipine  (NORVASC ) 5 MG tablet Take 1 tablet (5 mg total) by mouth daily. Dose decrease 90 tablet 1   atorvastatin  (LIPITOR) 20 MG tablet Take 1 tablet (20 mg total) by mouth daily. 90 tablet 2   carvedilol  (COREG ) 12.5 MG tablet Take 1 tablet (12.5 mg total) by mouth 2 (two) times daily with a meal. 60 tablet 3   furosemide  (LASIX ) 20 MG tablet Take 1 daily as needed for swelling 30 tablet 3   haloperidol  (HALDOL ) 1 MG tablet Take 1 tablet  (1 mg total) by mouth at bedtime. 30 tablet 3   loratadine  (CLARITIN ) 10 MG tablet Take 1 tablet (10 mg total) by mouth daily as needed for allergies. 60 tablet 1   meloxicam  (MOBIC ) 15 MG tablet TAKE 1 TABLET (15 MG TOTAL) BY MOUTH DAILY. 30 tablet 3   norethindrone  (AYGESTIN ) 5 MG tablet Take 1 tablet (5 mg total) by mouth daily. 90 tablet 1   valsartan -hydrochlorothiazide  (DIOVAN -HCT) 320-25 MG tablet Take 1 tablet by mouth daily. 90 tablet 3   Vitamin D , Ergocalciferol , (DRISDOL ) 1.25 MG (50000 UNIT) CAPS capsule Take 1 capsule (50,000 Units total) by mouth every 7 (seven) days on Wednesday 12 capsule 4   No current facility-administered medications for this visit.     Musculoskeletal: Strength & Muscle Tone: within normal limits Gait &  Station: normal Patient leans: N/A  Psychiatric Specialty Exam: Review of Systems  Psychiatric/Behavioral:  Positive for sleep disturbance. Negative for decreased concentration, dysphoric mood, hallucinations, self-injury and suicidal ideas. The patient is not nervous/anxious and is not hyperactive.     Blood pressure (!) 141/80, pulse 85, temperature 98.6 F (37 C), temperature source Oral, height 4' 9 (1.448 m), weight 146 lb 12.8 oz (66.6 kg), last menstrual period 09/12/2017, SpO2 100%.Body mass index is 31.77 kg/m.  General Appearance: Casual  Eye Contact:  Fair  Speech:  Clear and Coherent and Normal Rate  Volume:  Normal  Mood:  Euthymic  Affect:  Appropriate  Thought Process:  Coherent, Goal Directed, and Descriptions of Associations: Intact  Orientation:  Full (Time, Place, and Person)  Thought Content: WDL   Suicidal Thoughts:  No  Homicidal Thoughts:  No  Memory:  Immediate;   Good Recent;   Good Remote;   Good  Judgement:  Good  Insight:  Good  Psychomotor Activity:  Normal  Concentration:  Concentration: Good and Attention Span: Good  Recall:  Good  Fund of Knowledge: Good  Language: Good  Akathisia:  No  Handed:  Right   AIMS (if indicated): not done  Assets:  Communication Skills Desire for Improvement Financial Resources/Insurance Housing Intimacy Social Support  ADL's:  Intact  Cognition: WNL  Sleep:  Fair   Screenings: AIMS    Flowsheet Row Clinical Support from 01/18/2024 in [facility] Clinical Support from 09/23/2023 in [facility] Clinical Support from 06/24/2023 in [facility] Clinical Support from 01/22/2022 in [facility] Office Visit from 04/01/2020 in [facility]  AIMS Total Score 0 0 0 2 0   GAD-7    Flowsheet Row Clinical Support from 01/18/2024 in [facility] Clinical Support from 09/23/2023 in [facility] Clinical Support from 06/24/2023 in [facility] Clinical Support from 03/25/2023 in [facility] Clinical Support from 12/24/2022 in [facility]  Total GAD-7 Score 0 0 0 2 0   PHQ2-9    Flowsheet Row Clinical Support from 01/18/2024 in [facility] Clinical Support from 09/23/2023 in [facility] Clinical Support from 06/24/2023 in [facility] Clinical Support from 03/25/2023 in [facility] Clinical Support from 12/24/2022 in [facility]  PHQ-2 Total Score 2 0 0 0 0  PHQ-9 Total Score 9 -- -- -- --   Flowsheet Row Clinical Support from 01/18/2024 in [facility] Clinical Support from 09/23/2023 in [facility] Clinical Support from 06/24/2023 in [facility]  C-SSRS RISK CATEGORY No Risk No Risk No Risk     Assessment and Plan:   Cindy Garrison is a 53 year old, African-American female with a  past psychiatric history significant for schizoaffective disorder (unspecified type) who presents to [facility] for follow-up and medication management.  Patient presents to the encounter stating that she continues to take her Haldol  regularly and denies experiencing any adverse side effects at this time.  An aims assessment was performed with the patient scoring a 0.  Patient denies overt depressive symptoms nor does she endorse anxiety.  A PHQ-9 screen was performed the patient scoring a 9.  A GAD-7 screen was also performed with the  patient scoring a 0.  Patient denies changes in her behavior nor does she endorse paranoia.  Patient does not appear to be responding to internal/external stimuli.  Patient endorses stability on her current medication regimen and would like to continue taking her medication as prescribed.  Patient's medication to be e-prescribed to pharmacy of choice.  Patient's labs are up-to-date.  Provider to set patient up with an EKG appointment.  A [place] Suicide Severity Rating Scale was performed with the patient being considered no risk.  Patient denies suicidal ideations and is able to contract for safety at this time.  Collaboration of Care: Collaboration of Care: Medication Management AEB provider managing patient's psychiatric medications, Primary Care Provider AEB patient being seen by a primary care provider, and Psychiatrist AEB patient being followed by mental health provider at this facility  Patient/Guardian was advised Release of Information must be obtained prior to any record release in order to collaborate their care with an outside provider. Patient/Guardian was advised if they have not already done so to contact the registration department to sign all necessary forms in order for us  to release information regarding their care.   Consent: Patient/Guardian gives verbal consent for treatment and assignment of benefits  for services provided during this visit. Patient/Guardian expressed understanding and agreed to proceed.   1. Schizoaffective disorder, unspecified type (HCC)  - haloperidol  (HALDOL ) 1 MG tablet; Take 1 tablet (1 mg total) by mouth at bedtime.  Dispense: 30 tablet; Refill: 3  2. Long term current use of antipsychotic medication (Primary) Patient scheduled for an EKG appointment at this facility Patient has pending labs  Patient to follow-up in 3 months Provider spent a total of 30 minutes with the patient/reviewing patient's chart  Cindy FORBES Bolster, PA 01/18/2024, 10:00 AM

## 2024-02-01 ENCOUNTER — Other Ambulatory Visit (HOSPITAL_COMMUNITY)

## 2024-02-02 ENCOUNTER — Ambulatory Visit: Payer: MEDICAID | Attending: Internal Medicine | Admitting: Internal Medicine

## 2024-02-02 ENCOUNTER — Encounter: Payer: Self-pay | Admitting: Internal Medicine

## 2024-02-02 VITALS — BP 129/69 | HR 80 | Ht <= 58 in | Wt 148.0 lb

## 2024-02-02 DIAGNOSIS — F259 Schizoaffective disorder, unspecified: Secondary | ICD-10-CM | POA: Diagnosis not present

## 2024-02-02 DIAGNOSIS — N951 Menopausal and female climacteric states: Secondary | ICD-10-CM

## 2024-02-02 DIAGNOSIS — Z2821 Immunization not carried out because of patient refusal: Secondary | ICD-10-CM | POA: Diagnosis not present

## 2024-02-02 DIAGNOSIS — G8929 Other chronic pain: Secondary | ICD-10-CM

## 2024-02-02 DIAGNOSIS — Z86018 Personal history of other benign neoplasm: Secondary | ICD-10-CM

## 2024-02-02 DIAGNOSIS — I10 Essential (primary) hypertension: Secondary | ICD-10-CM | POA: Diagnosis not present

## 2024-02-02 DIAGNOSIS — M25511 Pain in right shoulder: Secondary | ICD-10-CM | POA: Diagnosis not present

## 2024-02-02 DIAGNOSIS — Z87898 Personal history of other specified conditions: Secondary | ICD-10-CM | POA: Diagnosis not present

## 2024-02-02 DIAGNOSIS — E559 Vitamin D deficiency, unspecified: Secondary | ICD-10-CM | POA: Diagnosis not present

## 2024-02-02 LAB — POCT GLYCOSYLATED HEMOGLOBIN (HGB A1C): HbA1c, POC (prediabetic range): 5.6 % — AB (ref 5.7–6.4)

## 2024-02-02 LAB — GLUCOSE, POCT (MANUAL RESULT ENTRY): POC Glucose: 80 mg/dL (ref 70–99)

## 2024-02-02 MED ORDER — CARVEDILOL 12.5 MG PO TABS: 12.5000 mg | ORAL_TABLET | Freq: Two times a day (BID) | ORAL | 1 refills | Status: AC

## 2024-02-02 MED ORDER — AMLODIPINE BESYLATE 5 MG PO TABS: 5.0000 mg | ORAL_TABLET | Freq: Every day | ORAL | 1 refills | Status: AC

## 2024-02-02 NOTE — Patient Instructions (Signed)
" °  VISIT SUMMARY: Today, we reviewed your chronic medical conditions and discussed your current treatment plan. Your blood pressure is well controlled, your A1c levels are normal, and your cholesterol levels are being managed effectively. We also addressed your vitamin D  deficiency, schizoaffective disorder, and right shoulder pain.  YOUR PLAN: -ESSENTIAL HYPERTENSION: Hypertension, or high blood pressure, is being well controlled with your current medications. Please continue taking carvedilol , amlodipine , and valsartan -hydrochlorothiazide  as prescribed, and Furosemide  when needed. Continue to maintain your low-salt diet.  -PREDIABETES: Prediabetes means your blood sugar levels are higher than normal but not high enough to be classified as diabetes. Your A1c levels are now normal, indicating good control. Please continue to avoid sugary drinks and snacks.  -HYPERLIPIDEMIA: Hyperlipidemia is having high levels of cholesterol in the blood. Your cholesterol levels are being managed with atorvastatin . Please continue taking atorvastatin  as prescribed.  -VITAMIN D  DEFICIENCY: Vitamin D  deficiency means you have lower than normal levels of vitamin D . Continue taking your vitamin D  supplement weekly. We have refilled your prescription and ordered a vitamin D  level test.  -SCHIZOAFFECTIVE DISORDER: Schizoaffective disorder is a mental health condition that includes symptoms of both schizophrenia and mood disorders. You are managing well with Haldol . Please continue taking Haldol  as prescribed.  -RIGHT SHOULDER PAIN: Your right shoulder pain is being managed with meloxicam , which is providing relief. Please continue using meloxicam  as needed.  -GENERAL HEALTH MAINTENANCE: Your mammogram results are normal. You have declined the flu vaccination.  INSTRUCTIONS: Please follow up with your psychiatrist as scheduled and refill your vitamin D  prescription today. Continue with your current medications and  lifestyle modifications. We will recheck your vitamin D  levels with the ordered test.                      Contains text generated by [employer]bridge.                                 Contains text generated by Abridge.   "

## 2024-02-02 NOTE — Progress Notes (Signed)
 "   Patient ID: Cindy Garrison, female    DOB: [birth year]  MRN: [id number]  CC: Hypertension (HTN & pre-diabetes f/u/No questions / concerns/No to flu vax)   Subjective: Cindy Garrison is a 54 y.o. female who presents for chronic ds management. Her chronic medical issues include:  Pt with hx of  HTN, allergies, schizoaffective disorder, HL, Vit D def, IDA d/t uterine fibroid, preDM, Plantar fasciitis   [employer] Language interpreter used during this encounter. #[id number]   Discussed the use of AI scribe software for clinical note transcription with the patient, who gave verbal consent to proceed.  History of Present Illness Cindy Garrison is a 54 year old female who presents for follow-up of her chronic medical conditions.  HTN: She is currently managing hypertension with carvedilol , amlodipine , and hydrochlorothiazide  daily and Furosemide  PRN. She adheres to a low-salt diet. No CP/SOB/LE edema  HL:  she takes atorvastatin  daily and plans to refill her prescription today. She confirms consistent use of the medication.  Regarding prediabetes, her recent A1c level is now normal today. She cooks her own meals and occasionally consumes sugary drinks.  Results for orders placed or performed in visit on 02/02/24  POCT glucose (manual entry)   Collection Time: 02/02/24  9:22 AM  Result Value Ref Range   POC Glucose 80 70 - 99 mg/dl  POCT glycosylated hemoglobin (Hb A1C)   Collection Time: 02/02/24  9:25 AM  Result Value Ref Range   Hemoglobin A1C     HbA1c POC (<> result, manual entry)     HbA1c, POC (prediabetic range) 5.6 (A) 5.7 - 6.4 %   HbA1c, POC (controlled diabetic range)     She has history of fibroids and irregular bleeding.  She has been on Norethindrone  by GYN for some time now.  FSH and LH levels were checked on last visit and were in the perimenopausal range.  My medical assistant was unable to reach her via phone to give my message of giving trail of stopping the  medication.  Vit D def: She takes vitamin D  once a week but ran out last week and plans to refill it today.  She has a history of schizoaffective disorder and continues to see her psychiatrist. She is on Haldol  and confirms taking it and feels stable on it.  She reports right shoulder pain radiating to the right arm, for which she uses diclofenac  gel and PO meloxicam , both of which provide relief. No worsening of pain recently.    Patient Active Problem List   Diagnosis Date Noted   Foot callus 12/02/2022   Chronic dental pain 08/04/2021   Acne vulgaris 05/28/2021   At moderate risk for fall 05/28/2021   Food insecurity 05/28/2021   Financial insecurity 05/28/2021   Language barrier affecting health care, French is not well understood 05/28/2021   Hematometra 03/20/2021   Ovarian mass 11/10/2020   Cervical os stenosis 11/10/2020   History of endometrial ablation 04/09/2020   Screening mammogram for breast cancer 04/09/2020   Cervical cancer screening 01/23/2020   Pure hypercholesterolemia 01/23/2020   Colon cancer screening 01/23/2020   Vitamin D  deficiency 01/23/2020   Fibroid uterus 03/26/2015   Schizoaffective disorder (HCC) 09/16/2014   Essential hypertension    Allergic rhinitis 09/13/2006     Medications Ordered Prior to Encounter[1]  Allergies[2]  Social History   Socioeconomic History   Marital status: Married    Spouse name: Not on file   Number of children: Not on  file   Years of education: Not on file   Highest education level: Not on file  Occupational History   Not on file  Tobacco Use   Smoking status: Never   Smokeless tobacco: Never  Vaping Use   Vaping status: Never Used  Substance and Sexual Activity   Alcohol use: Not Currently    Comment: occ.   Drug use: No   Sexual activity: Not on file    Comment: monogamous female partner for 5 years (2010)  Other Topics Concern   Not on file  Social History Narrative   Not on file   Social Drivers  of Health   Tobacco Use: Low Risk (01/18/2024)   Patient History    Smoking Tobacco Use: Never    Smokeless Tobacco Use: Never    Passive Exposure: Not on file  Financial Resource Strain: High Risk (05/28/2021)   Overall Financial Resource Strain (CARDIA)    Difficulty of Paying Living Expenses: Very hard  Food Insecurity: Food Insecurity Present (05/28/2021)   Hunger Vital Sign    Worried About Running Out of Food in the Last Year: Often true    Ran Out of Food in the Last Year: Often true  Transportation Needs: No Transportation Needs (03/20/2021)   PRAPARE - Administrator, Civil Service (Medical): No    Lack of Transportation (Non-Medical): No  Physical Activity: Not on file  Stress: Not on file  Social Connections: Not on file  Intimate Partner Violence: Not on file  Depression (PHQ2-9): Medium Risk (01/18/2024)   Depression (PHQ2-9)    PHQ-2 Score: 9  Alcohol Screen: Not on file  Housing: Not on file  Utilities: Not on file  Health Literacy: Not on file    Family History  Problem Relation Age of Onset   Breast cancer Sister     Past Surgical History:  Procedure Laterality Date   DILITATION & CURRETTAGE/HYSTROSCOPY WITH NOVASURE ABLATION N/A 12/22/2017   Procedure: DILATATION & CURETTAGE/ HYSTEROSCOPY WITH ENDOMETRIAL FIBROID AND  NOVASURE ABLATION;  Surgeon: Corene Coy, MD;  Location: [facility];  Service: Gynecology;  Laterality: N/A;    ROS: Review of Systems Negative except as stated above  PHYSICAL EXAM: BP 129/69 (BP Location: Left Arm, Patient Position: Sitting, Cuff Size: Normal)   Pulse 80   Ht 4' 9 (1.448 m)   Wt 148 lb (67.1 kg)   LMP 09/12/2017   SpO2 99%   BMI 32.03 kg/m   Physical Exam  General appearance - alert, well appearing, older female and in no distress Mental status - normal mood, behavior, speech, dress, motor activity, and thought processes Neck - supple, no significant adenopathy Chest - clear to auscultation,  no wheezes, rales or rhonchi, symmetric air entry Heart - normal rate, regular rhythm, normal S1, S2, no murmurs, rubs, clicks or gallops Extremities - peripheral pulses normal, no pedal edema, no clubbing or cyanosis MSK: RT shoulder - no point tenderness.Good ROM     Latest Ref Rng & Units 10/03/2023   11:49 AM 06/02/2023   10:44 AM 12/02/2022   10:38 AM  CMP  Glucose 70 - 99 mg/dL  75  82   BUN 6 - 24 mg/dL  6  17   Creatinine 9.42 - 1.00 mg/dL  9.40  9.39   Sodium 865 - 144 mmol/L  140  141   Potassium 3.5 - 5.2 mmol/L  3.7  3.8   Chloride 96 - 106 mmol/L  102  103  CO2 20 - 29 mmol/L  21  19   Calcium  8.7 - 10.2 mg/dL  9.0  9.0   Total Protein 6.0 - 8.5 g/dL 7.6   7.4   Total Bilirubin 0.0 - 1.2 mg/dL 0.4   0.3   Alkaline Phos 49 - 135 IU/L 63   48   AST 0 - 40 IU/L 25   30   ALT 0 - 32 IU/L 33   28    Lipid Panel     Component Value Date/Time   CHOL 197 10/03/2023 1149   TRIG 70 10/03/2023 1149   HDL 70 10/03/2023 1149   CHOLHDL 2.8 10/03/2023 1149   LDLCALC 114 (H) 10/03/2023 1149    CBC    Component Value Date/Time   WBC 7.1 02/15/2023 0809   WBC 7.0 01/01/2021 1805   RBC 5.23 02/15/2023 0809   RBC 5.08 01/01/2021 1805   HGB 14.4 02/15/2023 0809   HCT 42.2 02/15/2023 0809   PLT 238 02/15/2023 0809   MCV 81 02/15/2023 0809   MCH 27.5 02/15/2023 0809   MCH 25.8 (L) 01/01/2021 1805   MCHC 34.1 02/15/2023 0809   MCHC 34.3 01/01/2021 1805   RDW 12.8 02/15/2023 0809   LYMPHSABS 2.6 02/15/2023 0809   MONOABS 0.8 01/01/2021 1805   EOSABS 0.1 02/15/2023 0809   BASOSABS 0.0 02/15/2023 0809    ASSESSMENT AND PLAN: 1. Essential hypertension (Primary) At goal.  Continue carvedilo 12.5 mg twice a day l, amlodipine , valsartan /HCTZ 320/25 mg once a day and furosemide  20 mg as needed. - carvedilol  (COREG ) 12.5 MG tablet; Take 1 tablet (12.5 mg total) by mouth 2 (two) times daily with a meal.  Dispense: 180 tablet; Refill: 1 - amLODipine  (NORVASC ) 5 MG tablet; Take 1  tablet (5 mg total) by mouth daily. Dose decrease  Dispense: 90 tablet; Refill: 1 - Comprehensive metabolic panel with GFR - CBC  2. Vitamin D  deficiency Recheck vitamin D  level today to see whether she still needs to be on high-dose vitamin D . - VITAMIN D  25 Hydroxy (Vit-D Deficiency, Fractures)  3. History of prediabetes No longer in range for prediabetes.  Encouraged her to continue healthy eating habits. - POCT glucose (manual entry) - POCT glycosylated hemoglobin (Hb A1C)  4. History of uterine fibroid 5. Perimenopausal FSH and LH on last visit were in the perimenopausal range.  At this time advised that we give a trial of stopping Norethindrone .  Advised to let us  know if she develops any bleeding.  6. Chronic right shoulder pain Managed with meloxicam , effective and stable.  7. Schizoaffective disorder, unspecified type (HCC) Stable on Haldol .  Continue follow-up with her psychiatrist.  8. Influenza vaccination declined Recommended.  Patient declined.   Patient was given the opportunity to ask questions.  Patient verbalized understanding of the plan and was able to repeat key elements of the plan.   This documentation was completed using Paediatric nurse.  Any transcriptional errors are unintentional.  Orders Placed This Encounter  Procedures   VITAMIN D  25 Hydroxy (Vit-D Deficiency, Fractures)   Comprehensive metabolic panel with GFR   CBC   POCT glucose (manual entry)   POCT glycosylated hemoglobin (Hb A1C)     Requested Prescriptions   Signed Prescriptions Disp Refills   carvedilol  (COREG ) 12.5 MG tablet 180 tablet 1    Sig: Take 1 tablet (12.5 mg total) by mouth 2 (two) times daily with a meal.   amLODipine  (NORVASC ) 5 MG tablet 90 tablet  1    Sig: Take 1 tablet (5 mg total) by mouth daily. Dose decrease    Return in about 3 months (around 05/02/2024).  Barnie Louder, MD, FACP     [1]  Current Outpatient Medications on File Prior  to Visit  Medication Sig Dispense Refill   atorvastatin  (LIPITOR) 20 MG tablet Take 1 tablet (20 mg total) by mouth daily. 90 tablet 2   furosemide  (LASIX ) 20 MG tablet Take 1 daily as needed for swelling 30 tablet 3   haloperidol  (HALDOL ) 1 MG tablet Take 1 tablet (1 mg total) by mouth at bedtime. 30 tablet 3   loratadine  (CLARITIN ) 10 MG tablet Take 1 tablet (10 mg total) by mouth daily as needed for allergies. 60 tablet 1   meloxicam  (MOBIC ) 15 MG tablet TAKE 1 TABLET (15 MG TOTAL) BY MOUTH DAILY. 30 tablet 3   valsartan -hydrochlorothiazide  (DIOVAN -HCT) 320-25 MG tablet Take 1 tablet by mouth daily. 90 tablet 3   Vitamin D , Ergocalciferol , (DRISDOL ) 1.25 MG (50000 UNIT) CAPS capsule Take 1 capsule (50,000 Units total) by mouth every 7 (seven) days on Wednesday 12 capsule 4   [DISCONTINUED] hydrochlorothiazide  (HYDRODIURIL ) 25 MG tablet Take 1 tablet (25 mg total) by mouth daily. 30 tablet 2   [DISCONTINUED] lisinopril  (ZESTRIL ) 40 MG tablet TAKE 1 TABLET (40 MG TOTAL) BY MOUTH DAILY. TO LOWER BLOOD PRESSURE 30 tablet 2   [DISCONTINUED] omeprazole  (PRILOSEC) 40 MG capsule Take 1 capsule (40 mg total) by mouth 2 (two) times daily before a meal. 60 capsule 2   No current facility-administered medications on file prior to visit.  [2]  Allergies Allergen Reactions   Other Rash    Blood pressure pill (unknown name)   "

## 2024-02-03 ENCOUNTER — Ambulatory Visit: Payer: Self-pay | Admitting: Internal Medicine

## 2024-02-03 LAB — COMPREHENSIVE METABOLIC PANEL WITH GFR
ALT: 31 IU/L (ref 0–32)
AST: 27 IU/L (ref 0–40)
Albumin: 4.9 g/dL (ref 3.8–4.9)
Alkaline Phosphatase: 54 IU/L (ref 49–135)
BUN/Creatinine Ratio: 12 (ref 9–23)
BUN: 8 mg/dL (ref 6–24)
Bilirubin Total: 0.6 mg/dL (ref 0.0–1.2)
CO2: 19 mmol/L — ABNORMAL LOW (ref 20–29)
Calcium: 9.5 mg/dL (ref 8.7–10.2)
Chloride: 102 mmol/L (ref 96–106)
Creatinine, Ser: 0.68 mg/dL (ref 0.57–1.00)
Globulin, Total: 2.6 g/dL (ref 1.5–4.5)
Glucose: 79 mg/dL (ref 70–99)
Potassium: 4.4 mmol/L (ref 3.5–5.2)
Sodium: 140 mmol/L (ref 134–144)
Total Protein: 7.5 g/dL (ref 6.0–8.5)
eGFR: 103 mL/min/1.73

## 2024-02-03 LAB — VITAMIN D 25 HYDROXY (VIT D DEFICIENCY, FRACTURES): Vit D, 25-Hydroxy: 41.9 ng/mL (ref 30.0–100.0)

## 2024-02-03 LAB — CBC

## 2024-02-06 ENCOUNTER — Telehealth (HOSPITAL_COMMUNITY): Payer: Self-pay | Admitting: Physician Assistant

## 2024-02-06 ENCOUNTER — Ambulatory Visit (INDEPENDENT_AMBULATORY_CARE_PROVIDER_SITE_OTHER): Payer: MEDICAID

## 2024-02-06 ENCOUNTER — Encounter (HOSPITAL_COMMUNITY): Payer: Self-pay

## 2024-02-06 DIAGNOSIS — F259 Schizoaffective disorder, unspecified: Secondary | ICD-10-CM

## 2024-02-06 DIAGNOSIS — Z79899 Other long term (current) drug therapy: Secondary | ICD-10-CM

## 2024-02-06 NOTE — Progress Notes (Signed)
 Pt presented to office for EKG. Pt tolerated well. Provider will follow up with results. CJT-CMA.

## 2024-02-10 NOTE — Telephone Encounter (Signed)
 Copied from CRM [id number]. Topic: Clinical - Lab/Test Results >> Feb 10, 2024 10:51 AM   Cindy Garrison wrote:  Reason for CRM: Patient would like a callback to go over labs  Patient callback is [phone] (home)

## 2024-03-22 ENCOUNTER — Encounter: Payer: Self-pay | Admitting: Obstetrics and Gynecology

## 2024-04-20 ENCOUNTER — Encounter (HOSPITAL_COMMUNITY): Payer: MEDICAID | Admitting: Physician Assistant

## 2024-04-23 ENCOUNTER — Encounter (HOSPITAL_COMMUNITY): Payer: MEDICAID | Admitting: Physician Assistant

## 2024-05-08 ENCOUNTER — Ambulatory Visit: Payer: Self-pay | Admitting: Internal Medicine
